# Patient Record
Sex: Female | Born: 1955
Health system: Southern US, Community
[De-identification: ages and names within clinical notes are randomized; demographics above are authoritative.]

## PROBLEM LIST (undated history)

## (undated) DIAGNOSIS — J189 Pneumonia, unspecified organism: Secondary | ICD-10-CM

## (undated) DIAGNOSIS — Z8041 Family history of malignant neoplasm of ovary: Secondary | ICD-10-CM

## (undated) DIAGNOSIS — K219 Gastro-esophageal reflux disease without esophagitis: Secondary | ICD-10-CM

## (undated) DIAGNOSIS — Z923 Personal history of irradiation: Secondary | ICD-10-CM

## (undated) DIAGNOSIS — F419 Anxiety disorder, unspecified: Secondary | ICD-10-CM

## (undated) DIAGNOSIS — C801 Malignant (primary) neoplasm, unspecified: Secondary | ICD-10-CM

## (undated) DIAGNOSIS — C50919 Malignant neoplasm of unspecified site of unspecified female breast: Secondary | ICD-10-CM

## (undated) DIAGNOSIS — E785 Hyperlipidemia, unspecified: Secondary | ICD-10-CM

## (undated) DIAGNOSIS — R011 Cardiac murmur, unspecified: Secondary | ICD-10-CM

## (undated) DIAGNOSIS — E039 Hypothyroidism, unspecified: Secondary | ICD-10-CM

## (undated) DIAGNOSIS — E119 Type 2 diabetes mellitus without complications: Secondary | ICD-10-CM

## (undated) DIAGNOSIS — M8580 Other specified disorders of bone density and structure, unspecified site: Secondary | ICD-10-CM

## (undated) DIAGNOSIS — Z803 Family history of malignant neoplasm of breast: Secondary | ICD-10-CM

## (undated) DIAGNOSIS — I10 Essential (primary) hypertension: Secondary | ICD-10-CM

## (undated) DIAGNOSIS — Z8042 Family history of malignant neoplasm of prostate: Secondary | ICD-10-CM

## (undated) DIAGNOSIS — M1990 Unspecified osteoarthritis, unspecified site: Secondary | ICD-10-CM

## (undated) HISTORY — DX: Anxiety disorder, unspecified: F41.9

## (undated) HISTORY — PX: ABDOMINAL HYSTERECTOMY: SHX81

## (undated) HISTORY — PX: FOOT SURGERY: SHX648

## (undated) HISTORY — DX: Family history of malignant neoplasm of prostate: Z80.42

## (undated) HISTORY — DX: Cardiac murmur, unspecified: R01.1

## (undated) HISTORY — DX: Unspecified osteoarthritis, unspecified site: M19.90

## (undated) HISTORY — DX: Essential (primary) hypertension: I10

## (undated) HISTORY — DX: Hyperlipidemia, unspecified: E78.5

## (undated) HISTORY — PX: TUBAL LIGATION: SHX77

## (undated) HISTORY — DX: Hypothyroidism, unspecified: E03.9

## (undated) HISTORY — DX: Family history of malignant neoplasm of ovary: Z80.41

## (undated) HISTORY — DX: Family history of malignant neoplasm of breast: Z80.3

---

## 1999-01-01 ENCOUNTER — Other Ambulatory Visit: Admission: RE | Admit: 1999-01-01 | Discharge: 1999-01-01 | Payer: Self-pay | Admitting: Obstetrics and Gynecology

## 2000-09-10 ENCOUNTER — Encounter: Payer: Self-pay | Admitting: Obstetrics and Gynecology

## 2000-09-10 ENCOUNTER — Ambulatory Visit (HOSPITAL_COMMUNITY): Admission: RE | Admit: 2000-09-10 | Discharge: 2000-09-10 | Payer: Self-pay | Admitting: Obstetrics and Gynecology

## 2002-09-15 ENCOUNTER — Encounter: Payer: Self-pay | Admitting: Family Medicine

## 2002-09-15 ENCOUNTER — Ambulatory Visit (HOSPITAL_COMMUNITY): Admission: RE | Admit: 2002-09-15 | Discharge: 2002-09-15 | Payer: Self-pay | Admitting: Family Medicine

## 2002-10-03 ENCOUNTER — Encounter (INDEPENDENT_AMBULATORY_CARE_PROVIDER_SITE_OTHER): Payer: Self-pay | Admitting: Cardiology

## 2002-10-03 ENCOUNTER — Ambulatory Visit (HOSPITAL_COMMUNITY): Admission: RE | Admit: 2002-10-03 | Discharge: 2002-10-03 | Payer: Self-pay | Admitting: Family Medicine

## 2003-08-07 ENCOUNTER — Ambulatory Visit (HOSPITAL_COMMUNITY): Admission: RE | Admit: 2003-08-07 | Discharge: 2003-08-07 | Payer: Self-pay | Admitting: Obstetrics and Gynecology

## 2003-08-13 ENCOUNTER — Other Ambulatory Visit: Admission: RE | Admit: 2003-08-13 | Discharge: 2003-08-13 | Payer: Self-pay | Admitting: Obstetrics and Gynecology

## 2005-01-16 ENCOUNTER — Ambulatory Visit (HOSPITAL_COMMUNITY): Admission: RE | Admit: 2005-01-16 | Discharge: 2005-01-16 | Payer: Self-pay | Admitting: Obstetrics and Gynecology

## 2005-03-04 ENCOUNTER — Other Ambulatory Visit: Admission: RE | Admit: 2005-03-04 | Discharge: 2005-03-04 | Payer: Self-pay | Admitting: Obstetrics and Gynecology

## 2005-05-21 ENCOUNTER — Ambulatory Visit (HOSPITAL_COMMUNITY): Admission: RE | Admit: 2005-05-21 | Discharge: 2005-05-21 | Payer: Self-pay | Admitting: Gastroenterology

## 2006-02-26 ENCOUNTER — Ambulatory Visit (HOSPITAL_COMMUNITY): Admission: RE | Admit: 2006-02-26 | Discharge: 2006-02-26 | Payer: Self-pay | Admitting: Obstetrics and Gynecology

## 2006-03-08 ENCOUNTER — Encounter: Admission: RE | Admit: 2006-03-08 | Discharge: 2006-03-08 | Payer: Self-pay | Admitting: Obstetrics and Gynecology

## 2007-11-08 ENCOUNTER — Encounter (HOSPITAL_COMMUNITY): Admission: RE | Admit: 2007-11-08 | Discharge: 2007-12-22 | Payer: Self-pay | Admitting: Endocrinology

## 2009-01-16 ENCOUNTER — Encounter (HOSPITAL_COMMUNITY): Admission: RE | Admit: 2009-01-16 | Discharge: 2009-03-26 | Payer: Self-pay | Admitting: Endocrinology

## 2009-02-15 ENCOUNTER — Ambulatory Visit (HOSPITAL_COMMUNITY): Admission: RE | Admit: 2009-02-15 | Discharge: 2009-02-15 | Payer: Self-pay | Admitting: Endocrinology

## 2009-03-31 ENCOUNTER — Emergency Department (HOSPITAL_COMMUNITY): Admission: EM | Admit: 2009-03-31 | Discharge: 2009-03-31 | Payer: Self-pay | Admitting: Emergency Medicine

## 2009-05-09 ENCOUNTER — Emergency Department (HOSPITAL_COMMUNITY): Admission: EM | Admit: 2009-05-09 | Discharge: 2009-05-10 | Payer: Self-pay | Admitting: Emergency Medicine

## 2010-04-20 ENCOUNTER — Encounter: Payer: Self-pay | Admitting: Obstetrics and Gynecology

## 2011-02-26 ENCOUNTER — Other Ambulatory Visit (HOSPITAL_COMMUNITY): Payer: Self-pay | Admitting: Internal Medicine

## 2011-02-26 DIAGNOSIS — R0602 Shortness of breath: Secondary | ICD-10-CM

## 2011-04-03 ENCOUNTER — Encounter (HOSPITAL_COMMUNITY): Payer: Self-pay

## 2011-04-10 ENCOUNTER — Other Ambulatory Visit (HOSPITAL_COMMUNITY): Payer: Self-pay | Admitting: Radiology

## 2011-04-10 ENCOUNTER — Ambulatory Visit (HOSPITAL_COMMUNITY)
Admission: RE | Admit: 2011-04-10 | Discharge: 2011-04-10 | Disposition: A | Discharge status: Home / Self Care | Payer: BC Managed Care – PPO | Source: Ambulatory Visit | Attending: Internal Medicine | Admitting: Internal Medicine

## 2011-04-10 DIAGNOSIS — R0602 Shortness of breath: Secondary | ICD-10-CM | POA: Insufficient documentation

## 2011-04-10 MED ORDER — ALBUTEROL SULFATE (5 MG/ML) 0.5% IN NEBU
2.5000 mg | INHALATION_SOLUTION | Freq: Once | RESPIRATORY_TRACT | Status: AC
  Administered 2011-04-10: 2.5 mg via RESPIRATORY_TRACT

## 2011-04-10 MED ORDER — ALBUTEROL SULFATE (5 MG/ML) 0.5% IN NEBU: 2.5000 mg | INHALATION_SOLUTION | Freq: Once | RESPIRATORY_TRACT | Status: DC

## 2014-12-05 ENCOUNTER — Other Ambulatory Visit (HOSPITAL_COMMUNITY): Payer: Self-pay | Admitting: Respiratory Therapy

## 2014-12-05 DIAGNOSIS — R0600 Dyspnea, unspecified: Secondary | ICD-10-CM

## 2014-12-06 ENCOUNTER — Other Ambulatory Visit: Payer: Self-pay | Admitting: Internal Medicine

## 2014-12-06 DIAGNOSIS — Z0000 Encounter for general adult medical examination without abnormal findings: Secondary | ICD-10-CM

## 2014-12-06 DIAGNOSIS — F17209 Nicotine dependence, unspecified, with unspecified nicotine-induced disorders: Secondary | ICD-10-CM

## 2014-12-14 ENCOUNTER — Ambulatory Visit (HOSPITAL_COMMUNITY)
Admission: RE | Admit: 2014-12-14 | Discharge: 2014-12-14 | Disposition: A | Discharge status: Home / Self Care | Payer: BC Managed Care – PPO | Source: Ambulatory Visit | Attending: Internal Medicine | Admitting: Internal Medicine

## 2014-12-14 DIAGNOSIS — R0600 Dyspnea, unspecified: Secondary | ICD-10-CM | POA: Diagnosis not present

## 2014-12-14 LAB — PULMONARY FUNCTION TEST
DL/VA % pred: 98 %
DL/VA: 4.62 ml/min/mmHg/L
DLCO UNC % PRED: 78 %
DLCO UNC: 18.01 ml/min/mmHg
FEF 25-75 POST: 2.84 L/s
FEF 25-75 PRE: 3.26 L/s
FEF2575-%CHANGE-POST: -12 %
FEF2575-%PRED-POST: 143 %
FEF2575-%PRED-PRE: 164 %
FEV1-%CHANGE-POST: -4 %
FEV1-%Pred-Post: 111 %
FEV1-%Pred-Pre: 116 %
FEV1-POST: 2.21 L
FEV1-Pre: 2.31 L
FEV1FVC-%Change-Post: 0 %
FEV1FVC-%PRED-PRE: 111 %
FEV6-%CHANGE-POST: -4 %
FEV6-%PRED-POST: 102 %
FEV6-%Pred-Pre: 107 %
FEV6-POST: 2.5 L
FEV6-Pre: 2.62 L
FEV6FVC-%PRED-POST: 104 %
FEV6FVC-%Pred-Pre: 104 %
FVC-%CHANGE-POST: -4 %
FVC-%PRED-POST: 98 %
FVC-%Pred-Pre: 103 %
FVC-Post: 2.5 L
FVC-Pre: 2.62 L
POST FEV1/FVC RATIO: 88 %
PRE FEV1/FVC RATIO: 88 %
Post FEV6/FVC ratio: 100 %
Pre FEV6/FVC Ratio: 100 %
RV % PRED: 89 %
RV: 1.74 L
TLC % pred: 86 %
TLC: 4.24 L

## 2014-12-14 MED ORDER — ALBUTEROL SULFATE (2.5 MG/3ML) 0.083% IN NEBU
2.5000 mg | INHALATION_SOLUTION | Freq: Once | RESPIRATORY_TRACT | Status: AC
  Administered 2014-12-14: 2.5 mg via RESPIRATORY_TRACT

## 2014-12-24 ENCOUNTER — Telehealth (HOSPITAL_COMMUNITY): Payer: Self-pay | Admitting: *Deleted

## 2015-01-02 ENCOUNTER — Other Ambulatory Visit (HOSPITAL_COMMUNITY): Payer: Self-pay | Admitting: Internal Medicine

## 2015-01-02 DIAGNOSIS — R0600 Dyspnea, unspecified: Secondary | ICD-10-CM

## 2015-01-04 ENCOUNTER — Ambulatory Visit (HOSPITAL_COMMUNITY)
Admission: RE | Admit: 2015-01-04 | Discharge: 2015-01-04 | Disposition: A | Discharge status: Home / Self Care | Payer: BC Managed Care – PPO | Source: Ambulatory Visit | Attending: Internal Medicine | Admitting: Internal Medicine

## 2015-01-04 DIAGNOSIS — R918 Other nonspecific abnormal finding of lung field: Secondary | ICD-10-CM | POA: Diagnosis not present

## 2015-01-04 DIAGNOSIS — R0602 Shortness of breath: Secondary | ICD-10-CM | POA: Diagnosis present

## 2015-01-04 DIAGNOSIS — R0600 Dyspnea, unspecified: Secondary | ICD-10-CM

## 2015-01-04 LAB — POCT I-STAT CREATININE: CREATININE: 0.8 mg/dL (ref 0.44–1.00)

## 2015-01-04 MED ORDER — IOHEXOL 350 MG/ML SOLN
100.0000 mL | Freq: Once | INTRAVENOUS | Status: AC | PRN
Start: 2015-01-04 — End: 2015-01-04
  Administered 2015-01-04: 100 mL via INTRAVENOUS

## 2015-01-07 ENCOUNTER — Telehealth: Payer: Self-pay | Admitting: Internal Medicine

## 2015-01-07 ENCOUNTER — Encounter: Payer: Self-pay | Admitting: *Deleted

## 2015-01-07 DIAGNOSIS — R918 Other nonspecific abnormal finding of lung field: Secondary | ICD-10-CM

## 2015-01-07 NOTE — Telephone Encounter (Signed)
New patient appt-s/w patient and gave np appt for 10/17 @ 2 w/Dr. Julien Nordmann.  Referring Dr. Odette Horns Dx-Lung Mass   Referral information scan

## 2015-01-11 ENCOUNTER — Other Ambulatory Visit: Payer: Self-pay | Admitting: Medical Oncology

## 2015-01-11 DIAGNOSIS — R918 Other nonspecific abnormal finding of lung field: Secondary | ICD-10-CM

## 2015-01-14 ENCOUNTER — Other Ambulatory Visit (HOSPITAL_BASED_OUTPATIENT_CLINIC_OR_DEPARTMENT_OTHER): Payer: BC Managed Care – PPO

## 2015-01-14 ENCOUNTER — Other Ambulatory Visit: Payer: BC Managed Care – PPO

## 2015-01-14 ENCOUNTER — Ambulatory Visit (HOSPITAL_BASED_OUTPATIENT_CLINIC_OR_DEPARTMENT_OTHER): Payer: BC Managed Care – PPO | Admitting: Internal Medicine

## 2015-01-14 ENCOUNTER — Encounter: Payer: Self-pay | Admitting: Internal Medicine

## 2015-01-14 VITALS — BP 157/75 | HR 69 | Temp 98.1°F | Resp 20 | Wt 192.0 lb

## 2015-01-14 DIAGNOSIS — R918 Other nonspecific abnormal finding of lung field: Secondary | ICD-10-CM

## 2015-01-14 DIAGNOSIS — Z720 Tobacco use: Secondary | ICD-10-CM | POA: Diagnosis not present

## 2015-01-14 LAB — COMPREHENSIVE METABOLIC PANEL (CC13)
ALT: 27 U/L (ref 0–55)
ANION GAP: 8 meq/L (ref 3–11)
AST: 19 U/L (ref 5–34)
Albumin: 4 g/dL (ref 3.5–5.0)
Alkaline Phosphatase: 74 U/L (ref 40–150)
BILIRUBIN TOTAL: 0.67 mg/dL (ref 0.20–1.20)
BUN: 11 mg/dL (ref 7.0–26.0)
CHLORIDE: 109 meq/L (ref 98–109)
CO2: 26 meq/L (ref 22–29)
CREATININE: 0.9 mg/dL (ref 0.6–1.1)
Calcium: 9.3 mg/dL (ref 8.4–10.4)
EGFR: 86 mL/min/{1.73_m2} — ABNORMAL LOW (ref >90)
GLUCOSE: 162 mg/dL — AB (ref 70–140)
Potassium: 3.7 mEq/L (ref 3.5–5.1)
SODIUM: 143 meq/L (ref 136–145)
TOTAL PROTEIN: 7.3 g/dL (ref 6.4–8.3)

## 2015-01-14 LAB — CBC WITH DIFFERENTIAL/PLATELET
BASO%: 0.7 % (ref 0.0–2.0)
Basophils Absolute: 0 10*3/uL (ref 0.0–0.1)
EOS ABS: 0.1 10*3/uL (ref 0.0–0.5)
EOS%: 1.3 % (ref 0.0–7.0)
HCT: 42.7 % (ref 34.8–46.6)
HGB: 14.1 g/dL (ref 11.6–15.9)
LYMPH%: 37 % (ref 14.0–49.7)
MCH: 28.4 pg (ref 25.1–34.0)
MCHC: 33 g/dL (ref 31.5–36.0)
MCV: 86.2 fL (ref 79.5–101.0)
MONO#: 0.4 10*3/uL (ref 0.1–0.9)
MONO%: 7.5 % (ref 0.0–14.0)
NEUT#: 2.9 10*3/uL (ref 1.5–6.5)
NEUT%: 53.5 % (ref 38.4–76.8)
PLATELETS: 247 10*3/uL (ref 145–400)
RBC: 4.95 10*6/uL (ref 3.70–5.45)
RDW: 14.6 % — ABNORMAL HIGH (ref 11.2–14.5)
WBC: 5.5 10*3/uL (ref 3.9–10.3)
lymph#: 2 10*3/uL (ref 0.9–3.3)

## 2015-01-14 NOTE — Progress Notes (Signed)
[facility] [phone]   [phone]  CONSULT NOTE  REFERRING PHYSICIAN: Dr. Marton Redwood  REASON FOR CONSULTATION:  59 years old African-American female with questionable lung cancer.  HPI Taylor Walsh is a 59 y.o. female with past medical history significant for hypertension, hypercholesterolemia, hypothyroidism, osteoarthritis, borderline diabetes mellitus as well as long history of smoking. The patient mentions that for several weeks she has been complaining of shortness of breath with orthopnea. She was seen by her primary care physician and had bruising test that was unremarkable. She was in the process of being referred to a cardiologist for further evaluation but have shortness breath was getting worse and the patient had CT angiogram of the chest performed on 01/04/2015 to rule out pulmonary embolism. The scan showed no evidence for pulmonary embolus but there was a 1.4 x 1.0 cm multilobulated density noted laterally in the left lung apex concerning for possible neoplasm or malignancy. PET scan was recommended. Dr. Brigitte Pulse kindly referred the patient to me today for further evaluation and recommendation regarding the abnormality seen on the PET scan. When seen today the patient is very anxious and denied having any specific complaints except for the baseline shortness of breath which is worse when she lays down. She also has mild cough but no sputum production. The patient denied having any hemoptysis. She has no significant weight loss but has night sweats secondary to hormonal changes. She has no headache or visual changes. Family history significant for mother with brain tumor, father had stroke, sister had breast cancer, brother had prostate cancer and another brother with colon cancer. The patient is single and has 2 children. She was accompanied today by her sister Cleda Mccreedy. She works as an Optometrist. She has a history of smoking 1 pack per day for  around 40 years and quit 01/04/2015 after her CT scan results. She drinks alcohol occasionally and no history of drug abuse. HPI  Past Medical History  Diagnosis Date  . Anxiety   . Hypertension   . Dyslipidemia   . Hypothyroid   . Osteoporosis     History reviewed. No pertinent past surgical history.  Family History  Problem Relation Age of Onset  . Cancer Mother   . Stroke Father   . Cancer Sister   . Cancer Brother     Social History Social History  Substance Use Topics  . Smoking status: Former Smoker -- 1.00 packs/day    Quit date: 12/31/2014  . Smokeless tobacco: Former Systems developer    Quit date: 01/04/2015  . Alcohol Use: None    Allergies  Allergen Reactions  . Vicodin [Hydrocodone-Acetaminophen] Swelling    Current Outpatient Prescriptions  Medication Sig Dispense Refill  . atorvastatin (LIPITOR) 40 MG tablet Take 40 mg by mouth daily.    Marland Kitchen levothyroxine (SYNTHROID, LEVOTHROID) 88 MCG tablet Take 88 mcg by mouth.    . valsartan (DIOVAN) 160 MG tablet Take 160 mg by mouth daily.     No current facility-administered medications for this visit.    Review of Systems  Constitutional: negative Eyes: negative Ears, nose, mouth, throat, and face: negative Respiratory: positive for cough and dyspnea on exertion Cardiovascular: negative Gastrointestinal: negative Genitourinary:negative Integument/breast: negative Hematologic/lymphatic: negative Musculoskeletal:negative Neurological: negative Behavioral/Psych: positive for anxiety Endocrine: negative Allergic/Immunologic: negative  Physical Exam  AVW:UJWJX, healthy, no distress, well nourished, well developed and anxious SKIN: skin color, texture, turgor are normal, no rashes or significant lesions HEAD: Normocephalic, No masses, lesions, tenderness  or abnormalities EYES: normal, PERRLA, Conjunctiva are pink and non-injected EARS: External ears normal, Canals clear OROPHARYNX:no exudate, no erythema and  lips, buccal mucosa, and tongue normal  NECK: supple, no adenopathy, no JVD LYMPH:  no palpable lymphadenopathy, no hepatosplenomegaly BREAST:not examined LUNGS: clear to auscultation , and palpation HEART: regular rate & rhythm and no murmurs ABDOMEN:abdomen soft, non-tender, normal bowel sounds and no masses or organomegaly BACK: Back symmetric, no curvature., No CVA tenderness EXTREMITIES:no joint deformities, effusion, or inflammation, no edema, no skin discoloration  NEURO: alert & oriented x 3 with fluent speech, no focal motor/sensory deficits  PERFORMANCE STATUS: ECOG 1  LABORATORY DATA: Lab Results  Component Value Date   WBC 5.5 01/14/2015   HGB 14.1 01/14/2015   HCT 42.7 01/14/2015   MCV 86.2 01/14/2015   PLT 247 01/14/2015      Chemistry      Component Value Date/Time   NA 143 01/14/2015 1403   K 3.7 01/14/2015 1403   CO2 26 01/14/2015 1403   BUN 11.0 01/14/2015 1403   CREATININE 0.9 01/14/2015 1403   CREATININE 0.80 01/04/2015 1412      Component Value Date/Time   CALCIUM 9.3 01/14/2015 1403   ALKPHOS 74 01/14/2015 1403   AST 19 01/14/2015 1403   ALT 27 01/14/2015 1403   BILITOT 0.67 01/14/2015 1403       RADIOGRAPHIC STUDIES: Ct Angio Chest Pe W/cm &/or Wo Cm  01/04/2015  CLINICAL DATA:  Shortness of breath for several months. EXAM: CT ANGIOGRAPHY CHEST WITH CONTRAST TECHNIQUE: Multidetector CT imaging of the chest was performed using the standard protocol during bolus administration of intravenous contrast. Multiplanar CT image reconstructions and MIPs were obtained to evaluate the vascular anatomy. CONTRAST:  163mL OMNIPAQUE IOHEXOL 350 MG/ML SOLN COMPARISON:  Chest radiograph of March 31, 2009. FINDINGS: 14 x 10 mm multilobulated density is noted laterally in left lung apex concerning for possible neoplasm. No pneumothorax or pleural effusion is noted. Right lung is clear. There is no evidence of pulmonary embolus. Atherosclerosis of thoracic aorta is  noted without evidence of aneurysm or dissection. No mediastinal mass or adenopathy is noted. Visualized portion of upper abdomen is unremarkable. No significant mediastinal mass or adenopathy is noted. No significant osseous abnormality is noted. Review of the MIP images confirms the above findings. IMPRESSION: No evidence of pulmonary embolus. 14 x 10 mm multilobulated density noted laterally in left lung apex concerning for possible neoplasm or malignancy. PET scan is recommended for further evaluation. These results will be called to the ordering clinician or representative by the Radiologist Assistant, and communication documented in the PACS or zVision Dashboard. Electronically Signed   By: Marijo Conception, M.D.   On: 01/04/2015 14:57    ASSESSMENT: This is a very pleasant 59 years old African-American female with long history of smoking and presenting with left lung apical nodule measuring 1.4 x 1.0 cm concerning for primary lung neoplasm but other etiologies cannot be excluded at this point.   PLAN: I had a lengthy discussion with the patient and her sister today about her current scan results and further investigation to confirm a diagnosis. I showed her the images of the CT scan of the chest. I recommended for the patient to have a PET scan performed for further evaluation of this pulmonary nodule. If the nodule is positive on the PET scan, I will refer the patient to thoracic surgery for consideration of surgical resection after ordering a pulmonary function tests. The patient  would come back for follow-up visit in 2 weeks for reevaluation and more detailed discussion of her treatment options based on the PET scan results. For smoke cessation, I strongly encouraged the patient to continue quitting smoking and offered her help if she has any relapse. She was advised to call immediately if she has any concerning symptoms in the interval.  The patient voices understanding of current disease  status and treatment options and is in agreement with the current care plan.  All questions were answered. The patient knows to call the clinic with any problems, questions or concerns. We can certainly see the patient much sooner if necessary.  Thank you so much for allowing me to participate in the care of Taylor Walsh. I will continue to follow up the patient with you and assist in her care.  I spent 40 minutes counseling the patient face to face. The total time spent in the appointment was 60 minutes.  Disclaimer: This note was dictated with voice recognition software. Similar sounding words can inadvertently be transcribed and may not be corrected upon review.   Shinita Mac K. January 14, 2015, 2:49 PM

## 2015-01-15 ENCOUNTER — Telehealth: Payer: Self-pay | Admitting: Internal Medicine

## 2015-01-15 NOTE — Telephone Encounter (Signed)
vm full....mailed appt sched/avs and letter

## 2015-01-22 ENCOUNTER — Ambulatory Visit (HOSPITAL_COMMUNITY)
Admission: RE | Admit: 2015-01-22 | Discharge: 2015-01-22 | Disposition: A | Discharge status: Home / Self Care | Payer: BC Managed Care – PPO | Source: Ambulatory Visit | Attending: Internal Medicine | Admitting: Internal Medicine

## 2015-01-22 DIAGNOSIS — I2510 Atherosclerotic heart disease of native coronary artery without angina pectoris: Secondary | ICD-10-CM | POA: Insufficient documentation

## 2015-01-22 DIAGNOSIS — L989 Disorder of the skin and subcutaneous tissue, unspecified: Secondary | ICD-10-CM | POA: Insufficient documentation

## 2015-01-22 DIAGNOSIS — Z90710 Acquired absence of both cervix and uterus: Secondary | ICD-10-CM | POA: Diagnosis not present

## 2015-01-22 DIAGNOSIS — I700 Atherosclerosis of aorta: Secondary | ICD-10-CM | POA: Diagnosis not present

## 2015-01-22 DIAGNOSIS — R911 Solitary pulmonary nodule: Secondary | ICD-10-CM | POA: Insufficient documentation

## 2015-01-22 DIAGNOSIS — Z87891 Personal history of nicotine dependence: Secondary | ICD-10-CM | POA: Diagnosis not present

## 2015-01-22 DIAGNOSIS — R918 Other nonspecific abnormal finding of lung field: Secondary | ICD-10-CM

## 2015-01-22 MED ORDER — FLUDEOXYGLUCOSE F - 18 (FDG) INJECTION
9.1000 | Freq: Once | INTRAVENOUS | Status: DC | PRN
  Administered 2015-01-22: 9.1 via INTRAVENOUS
  Filled 2015-01-22: qty 9.1

## 2015-01-25 LAB — GLUCOSE, CAPILLARY: Glucose-Capillary: 99 mg/dL (ref 65–99)

## 2015-01-28 ENCOUNTER — Institutional Professional Consult (permissible substitution) (INDEPENDENT_AMBULATORY_CARE_PROVIDER_SITE_OTHER): Payer: BC Managed Care – PPO | Admitting: Thoracic Surgery (Cardiothoracic Vascular Surgery)

## 2015-01-28 ENCOUNTER — Encounter: Payer: Self-pay | Admitting: Thoracic Surgery (Cardiothoracic Vascular Surgery)

## 2015-01-28 ENCOUNTER — Other Ambulatory Visit: Payer: Self-pay | Admitting: *Deleted

## 2015-01-28 VITALS — BP 136/69 | HR 67 | Resp 20 | Ht 65.0 in | Wt 192.0 lb

## 2015-01-28 DIAGNOSIS — K219 Gastro-esophageal reflux disease without esophagitis: Secondary | ICD-10-CM | POA: Insufficient documentation

## 2015-01-28 DIAGNOSIS — R918 Other nonspecific abnormal finding of lung field: Secondary | ICD-10-CM

## 2015-01-28 DIAGNOSIS — I10 Essential (primary) hypertension: Secondary | ICD-10-CM | POA: Diagnosis not present

## 2015-01-28 DIAGNOSIS — E039 Hypothyroidism, unspecified: Secondary | ICD-10-CM | POA: Diagnosis not present

## 2015-01-28 DIAGNOSIS — R911 Solitary pulmonary nodule: Secondary | ICD-10-CM

## 2015-01-28 NOTE — Progress Notes (Signed)
PCP is Marton Redwood, MD Referring Provider is Curt Bears, MD  Chief Complaint  Patient presents with  . Lung Mass    surgical eval, CTA Chest 01/04/15, PET Scan 01/22/15    HPI: 59 year old woman with a 42-pack-year history of tobacco abuse who is sent for consultation regarding a left upper lobe lung nodule.  Taylor Walsh is a 59 year old woman with a history of tobacco abuse (1 pack per day beginning at age 14, quit 01/04/2015). About a month ago she was having shortness of breath when lying on her back. She noticed this when she was trying to sleep. She described as a sensation that something was trying to close of her airway. She also had similar symptoms when engaging in sexual intercourse. She did not have any tightness in her throat or shortness of breath with exertion when upright.  She saw Dr. Brigitte Pulse. Pulmonary function testing was unrevealing. A CT of the chest was done which showed a nodule in the left apex. A PET CT was done to further evaluate. The nodule is mildly hypermetabolic with an SUV of 1.5. There also was an area of hypermetabolism in the right axilla with no CT correlate.  Since the workup was initiated her symptoms have resolved. She does have occasional wheezing. She has not had any unusual cough. She denies hemoptysis. She has not had any significant weight loss over the past 3 months. She has lost 36 pounds with diet and exercise a year ago but has gained most of that back. She has not had any chest pain, pressure, or tightness with exertion.  Zubrod Score: At the time of surgery this patient's most appropriate activity status/level should be described as: [x]     0    Normal activity, no symptoms []     1    Restricted in physical strenuous activity but ambulatory, able to do out light work []     2    Ambulatory and capable of self care, unable to do work activities, up and about >50 % of waking hours                              []     3    Only limited self care,  in bed greater than 50% of waking hours []     4    Completely disabled, no self care, confined to bed or chair []     5    Moribund    Past Medical History  Diagnosis Date  . Anxiety   . Hypertension   . Dyslipidemia   . Hypothyroid   . Osteoporosis   . Arthritis   . Heart murmur     Past Surgical History  Procedure Laterality Date  . Foot surgery      bi-lat/ repaired hammer toes  . Tubal ligation    . Abdominal hysterectomy      Family History  Problem Relation Age of Onset  . Cancer Mother     brain, ovarian  . Stroke Father   . Cancer Sister     breast  . Cancer Brother   . Hypertension Mother   . Hypertension Father   . Hypertension Sister     Social History Social History  Substance Use Topics  . Smoking status: Current Some Day Smoker -- 1.00 packs/day for 42 years    Last Attempt to Quit: 12/31/2014  . Smokeless tobacco: Former Systems developer    Quit date:  01/04/2015     Comment: Quit 01/04/2015  . Alcohol Use: No    Current Outpatient Prescriptions  Medication Sig Dispense Refill  . atorvastatin (LIPITOR) 40 MG tablet Take 40 mg by mouth daily.    . calcium-vitamin D 250-100 MG-UNIT tablet Take 1 tablet by mouth 2 (two) times daily.    . ergocalciferol (VITAMIN D2) 50000 UNITS capsule Take 50,000 Units by mouth once a week.    . levothyroxine (SYNTHROID, LEVOTHROID) 88 MCG tablet Take 88 mcg by mouth.    . valsartan (DIOVAN) 160 MG tablet Take 160 mg by mouth daily.     No current facility-administered medications for this visit.    Allergies  Allergen Reactions  . Vicodin [Hydrocodone-Acetaminophen] Swelling    Review of Systems  Constitutional: Negative for fever, chills, activity change, appetite change, fatigue and unexpected weight change.  HENT: Negative for dental problem, nosebleeds, trouble swallowing and voice change.   Eyes: Negative for photophobia and visual disturbance.  Respiratory: Positive for shortness of breath (with lying on back,  now resolved) and wheezing. Negative for cough.   Cardiovascular: Negative for chest pain, palpitations and leg swelling.  Gastrointestinal: Positive for abdominal pain. Negative for blood in stool.       Reflux, hiatal hernia  Genitourinary: Negative for dysuria and difficulty urinating.  Musculoskeletal: Positive for joint swelling and arthralgias.  Skin:       Draining knot right axilla  Neurological: Negative for syncope, weakness and headaches.  Hematological: Negative for adenopathy. Does not bruise/bleed easily.  All other systems reviewed and are negative.   BP 136/69 mmHg  Pulse 67  Resp 20  Ht 5\' 5"  (1.651 m)  Wt 192 lb (87.091 kg)  BMI 31.95 kg/m2  SpO2  Physical Exam  Constitutional: She is oriented to person, place, and time. She appears well-developed and well-nourished.  HENT:  Head: Atraumatic.  Mouth/Throat: No oropharyngeal exudate.  Eyes: Conjunctivae and EOM are normal. Pupils are equal, round, and reactive to light. No scleral icterus.  Neck: Neck supple. No tracheal deviation present. No thyromegaly present.  Cardiovascular: Normal rate, regular rhythm and intact distal pulses.  Exam reveals no gallop and no friction rub.   Murmur heard. Pulses:      Radial pulses are 2+ on the right side, and 2+ on the left side.       Dorsalis pedis pulses are 2+ on the right side, and 2+ on the left side.       Posterior tibial pulses are 2+ on the right side, and 2+ on the left side.  Pulmonary/Chest: Effort normal and breath sounds normal. No respiratory distress. She has no wheezes. She has no rales.  Abdominal: Soft. She exhibits no distension. There is no tenderness.  Musculoskeletal: Normal range of motion. She exhibits no edema or tenderness.  Lymphadenopathy:    She has no cervical adenopathy.    She has axillary adenopathy.       Right axillary: Lateral (Hidradenitis) adenopathy present.  Neurological: She is alert and oriented to person, place, and time. No  cranial nerve deficit. She exhibits normal muscle tone.  Motor 5/5  Skin: Skin is warm and dry.  Hidradenitis right axilla  Psychiatric: She has a normal mood and affect.  Vitals reviewed.    Diagnostic Tests: CT ANGIOGRAPHY CHEST WITH CONTRAST  TECHNIQUE: Multidetector CT imaging of the chest was performed using the standard protocol during bolus administration of intravenous contrast. Multiplanar CT image reconstructions and MIPs were obtained to  evaluate the vascular anatomy.  CONTRAST: 142mL OMNIPAQUE IOHEXOL 350 MG/ML SOLN  COMPARISON: Chest radiograph of March 31, 2009.  FINDINGS: 14 x 10 mm multilobulated density is noted laterally in left lung apex concerning for possible neoplasm. No pneumothorax or pleural effusion is noted. Right lung is clear. There is no evidence of pulmonary embolus. Atherosclerosis of thoracic aorta is noted without evidence of aneurysm or dissection. No mediastinal mass or adenopathy is noted. Visualized portion of upper abdomen is unremarkable. No significant mediastinal mass or adenopathy is noted. No significant osseous abnormality is noted.  Review of the MIP images confirms the above findings.  IMPRESSION: No evidence of pulmonary embolus.  14 x 10 mm multilobulated density noted laterally in left lung apex concerning for possible neoplasm or malignancy. PET scan is recommended for further evaluation. These results will be called to the ordering clinician or representative by the Radiologist Assistant, and communication documented in the PACS or zVision Dashboard.   Electronically Signed  By: Marijo Conception, M.D.  On: 01/04/2015 14:57  NUCLEAR MEDICINE PET SKULL BASE TO THIGH  TECHNIQUE: 9.1 mCi F-18 FDG was injected intravenously. Full-ring PET imaging was performed from the skull base to thigh after the radiotracer. CT data was obtained and used for attenuation correction and  anatomic localization.  FASTING BLOOD GLUCOSE: Value: 99 mg/dl  COMPARISON: 01/04/2015 chest CT.  FINDINGS: NECK  No hypermetabolic lymph nodes in the neck.  CHEST  There is a subsolid 1.0 x 1.0 cm peripheral left upper lobe pulmonary nodule with solid 0.8 cm component (series 8/image 14) with minimal metabolism (max SUV 1.5). No acute consolidative airspace disease or additional significant pulmonary nodules.  There is a hypermetabolic 1.8 x 1.2 cm dermal lesion in the lateral upper right chest near the axilla (series 4/image 45) with max SUV 8.8.  No hypermetabolic axillary, mediastinal or hilar nodes. There is atherosclerosis of the thoracic aorta, the great vessels of the mediastinum and the coronary arteries, including calcified atherosclerotic plaque in the left anterior descending coronary artery.  ABDOMEN/PELVIS  No abnormal hypermetabolic activity within the liver, pancreas, adrenal glands, or spleen. No hypermetabolic lymph nodes in the abdomen or pelvis. Status post hysterectomy, with no abnormal findings at the vaginal cuff. No adnexal mass.  SKELETON  No focal hypermetabolic activity to suggest skeletal metastasis. There is nonspecific asymmetric hyper metabolism throughout the right glenohumeral joint, suggesting a nonspecific inflammatory arthropathy.  IMPRESSION: 1. Non-hypermetabolic (max SUV 1.5) subsolid 1.0 cm peripheral left upper lobe pulmonary nodule, suggesting a benign etiology. Given the small nodule size near the PET threshold, subsolid attenuation and lack of prior imaging, a follow-up routine unenhanced chest CT is advised in 6-12 months to document stability of this nodule, as an indolent neoplasm is not entirely excluded. 2. No hypermetabolic thoracic adenopathy or distant metastatic disease. 3. Hypermetabolic 1.8 cm dermal lesion in the lateral upper right chest near the axilla, nonspecific, possibly an inflamed  sebaceous cyst, however recommend correlation with clinical exam to exclude a melanoma.   Electronically Signed  By: Ilona Sorrel M.D.  On: 01/22/2015 15:49  I personally reviewed the CT and PET CT. I concur with findings as noted above. The right axillary activity correlates with hidradenitis in the right axilla on exam.  Pulmonary function testing FVC = 2.62 disease 103%) FEV1 = 2.31 (116%) No change with bronchodilators DLCO = 18.01 (78%)  Impression: 59 year old woman with a history of tobacco abuse who has a 1 cm nodule in the left upper  lobe. This nodule is mildly hypermetabolic by PET CT with an SUV of 1.5. Radiology suggested this suggests benign etiology. I am more concerned given her age, history of tobacco abuse, and the fact that this is solid nodule has any hypermetabolic activity given its size.  I discussed the potential options with Taylor Walsh and her husband. One would be to follow her radiographically. The other would be to proceed with left VATS and wedge resection for definitive diagnosis. We would then proceed with segmentectomy or lobectomy at the same setting if this turned out to be cancer on frozen section. We discussed the relative advantages and disadvantages of each of those approaches.  I described the proposed operation to her in detail. I informed them of the need for general anesthesia, incisions to be used, the intraoperative decision making, chest tube drainage postoperatively, the expected hospital stay, and the overall recovery. I reviewed the indications, risks, benefits, and alternatives. They understand the risks include, but are not limited to death, MI, DVT, PE, bleeding, possible need for transfusion, infection, prolonged air leak, cardiac arrhythmias, as well as the possibility of other unforeseeable complications. She understands and accepts the risks and wishes to proceed.  We also discussed the use of cryo-analgesia of intercostal nerves at  the time of surgery to help with postoperative pain control. She wants to think that over and will let me know on the day of surgery whether she would like to have that done.  Right axillary hidradenitis- follow up with primary MD  Plan:  Left VATS, wedge resection, possible lobectomy or segmentectomy on Monday, 02/25/2015  Melrose Nakayama, MD [facility] [phone]

## 2015-02-05 ENCOUNTER — Encounter: Payer: Self-pay | Admitting: *Deleted

## 2015-02-05 ENCOUNTER — Ambulatory Visit (HOSPITAL_BASED_OUTPATIENT_CLINIC_OR_DEPARTMENT_OTHER): Payer: BC Managed Care – PPO | Admitting: Internal Medicine

## 2015-02-05 ENCOUNTER — Encounter: Payer: Self-pay | Admitting: Internal Medicine

## 2015-02-05 VITALS — BP 141/95 | HR 64 | Temp 98.3°F | Resp 18 | Ht 65.0 in | Wt 197.3 lb

## 2015-02-05 DIAGNOSIS — R918 Other nonspecific abnormal finding of lung field: Secondary | ICD-10-CM | POA: Diagnosis not present

## 2015-02-05 DIAGNOSIS — R911 Solitary pulmonary nodule: Secondary | ICD-10-CM | POA: Diagnosis not present

## 2015-02-05 NOTE — Progress Notes (Signed)
Oncology Nurse Navigator Documentation  Oncology Nurse Navigator Flowsheets 02/05/2015  Navigator Encounter Type Clinic/MDC/spoke with patient and husband today.  Patient is having lung surgery in 2 weeks.  I spoke to them about what is her biggest concern.  She stated she was concerned about how long she would be in the hospital.  I educated them on nursing interventions to help her feel better and hopefully get out of the hospital sooner.    Patient Visit Type Initial  Treatment Phase Abnormal Scans  Barriers/Navigation Needs Education  Education Other  Interventions Education Method  Education Method Verbal  Time Spent with Patient 15

## 2015-02-05 NOTE — Progress Notes (Signed)
[place] [phone]   [phone]  OFFICE PROGRESS NOTE  Marton Redwood, MD [place] [address]  DIAGNOSIS: Suspicious left upper lobe lung nodule.  PRIOR THERAPY: None  CURRENT THERAPY: The patient is scheduled for surgical resection on 02/25/2015 by Dr. Roxan Hockey  INTERVAL HISTORY: Taylor Walsh 59 y.o. female returns to the clinic today for follow-up visit. The patient is feeling fine today was no specific complaints. She denied having any significant weight loss or night sweats. She has no chest pain, shortness of breath, cough or hemoptysis. She denied having any significant fever or chills. She recently had a PET scan performed that showed a sub-solid 1.0 x 1.0 cm peripheral left upper lobe pulmonary nodule with a solid 0.8 cm component but with minimal SUV of 1.5 questionable to be benign. The patient was also seen by Dr. Roxan Hockey and she was given several options including observation versus biopsy versus surgical resection and she decided to proceed with surgical resection which is is scheduled on 02/25/2015.  MEDICAL HISTORY: Past Medical History  Diagnosis Date  . Anxiety   . Hypertension   . Dyslipidemia   . Hypothyroid   . Osteoporosis   . Arthritis   . Heart murmur     ALLERGIES:  is allergic to vicodin.  MEDICATIONS:  Current Outpatient Prescriptions  Medication Sig Dispense Refill  . atorvastatin (LIPITOR) 40 MG tablet Take 40 mg by mouth daily.    . calcium-vitamin D 250-100 MG-UNIT tablet Take 1 tablet by mouth 2 (two) times daily.    . ergocalciferol (VITAMIN D2) 50000 UNITS capsule Take 50,000 Units by mouth once a week.    . levothyroxine (SYNTHROID, LEVOTHROID) 88 MCG tablet Take 88 mcg by mouth.    . valsartan (DIOVAN) 160 MG tablet Take 160 mg by mouth daily.     No current facility-administered medications for this visit.    SURGICAL HISTORY:  Past Surgical History  Procedure  Laterality Date  . Foot surgery      bi-lat/ repaired hammer toes  . Tubal ligation    . Abdominal hysterectomy      REVIEW OF SYSTEMS:  A comprehensive review of systems was negative.   PHYSICAL EXAMINATION: General appearance: alert, cooperative and no distress Head: Normocephalic, without obvious abnormality, atraumatic Neck: no adenopathy, no JVD, supple, symmetrical, trachea midline and thyroid not enlarged, symmetric, no tenderness/mass/nodules Lymph nodes: Cervical, supraclavicular, and axillary nodes normal. Resp: clear to auscultation bilaterally Back: symmetric, no curvature. ROM normal. No CVA tenderness. Cardio: regular rate and rhythm, S1, S2 normal, no murmur, click, rub or gallop GI: soft, non-tender; bowel sounds normal; no masses,  no organomegaly Extremities: extremities normal, atraumatic, no cyanosis or edema  ECOG PERFORMANCE STATUS: 1 - Symptomatic but completely ambulatory  Blood pressure 141/95, pulse 64, temperature 98.3 F (36.8 C), temperature source Oral, resp. rate 18, height 5\' 5"  (1.651 m), weight 197 lb 4.8 oz (89.495 kg), SpO2 100 %.  LABORATORY DATA: Lab Results  Component Value Date   WBC 5.5 01/14/2015   HGB 14.1 01/14/2015   HCT 42.7 01/14/2015   MCV 86.2 01/14/2015   PLT 247 01/14/2015      Chemistry      Component Value Date/Time   NA 143 01/14/2015 1403   K 3.7 01/14/2015 1403   CO2 26 01/14/2015 1403   BUN 11.0 01/14/2015 1403   CREATININE 0.9 01/14/2015 1403   CREATININE 0.80 01/04/2015 1412  Component Value Date/Time   CALCIUM 9.3 01/14/2015 1403   ALKPHOS 74 01/14/2015 1403   AST 19 01/14/2015 1403   ALT 27 01/14/2015 1403   BILITOT 0.67 01/14/2015 1403       RADIOGRAPHIC STUDIES: Nm Pet Image Initial (pi) Skull Base To Thigh  01/22/2015  CLINICAL DATA:  Initial treatment strategy for left upper lobe lung nodule. Former smoker, recently quit. EXAM: NUCLEAR MEDICINE PET SKULL BASE TO THIGH TECHNIQUE: 9.1 mCi F-18  FDG was injected intravenously. Full-ring PET imaging was performed from the skull base to thigh after the radiotracer. CT data was obtained and used for attenuation correction and anatomic localization. FASTING BLOOD GLUCOSE:  Value: 99 mg/dl COMPARISON:  01/04/2015 chest CT. FINDINGS: NECK No hypermetabolic lymph nodes in the neck. CHEST There is a subsolid 1.0 x 1.0 cm peripheral left upper lobe pulmonary nodule with solid 0.8 cm component (series 8/image 14) with minimal metabolism (max SUV 1.5). No acute consolidative airspace disease or additional significant pulmonary nodules. There is a hypermetabolic 1.8 x 1.2 cm dermal lesion in the lateral upper right chest near the axilla (series 4/image 45) with max SUV 8.8. No hypermetabolic axillary, mediastinal or hilar nodes. There is atherosclerosis of the thoracic aorta, the great vessels of the mediastinum and the coronary arteries, including calcified atherosclerotic plaque in the left anterior descending coronary artery. ABDOMEN/PELVIS No abnormal hypermetabolic activity within the liver, pancreas, adrenal glands, or spleen. No hypermetabolic lymph nodes in the abdomen or pelvis. Status post hysterectomy, with no abnormal findings at the vaginal cuff. No adnexal mass. SKELETON No focal hypermetabolic activity to suggest skeletal metastasis. There is nonspecific asymmetric hyper metabolism throughout the right glenohumeral joint, suggesting a nonspecific inflammatory arthropathy. IMPRESSION: 1. Non-hypermetabolic (max SUV 1.5) subsolid 1.0 cm peripheral left upper lobe pulmonary nodule, suggesting a benign etiology. Given the small nodule size near the PET threshold, subsolid attenuation and lack of prior imaging, a follow-up routine unenhanced chest CT is advised in 6-12 months to document stability of this nodule, as an indolent neoplasm is not entirely excluded. 2. No hypermetabolic thoracic adenopathy or distant metastatic disease. 3. Hypermetabolic 1.8 cm  dermal lesion in the lateral upper right chest near the axilla, nonspecific, possibly an inflamed sebaceous cyst, however recommend correlation with clinical exam to exclude a melanoma. Electronically Signed   By: Ilona Sorrel M.D.   On: 01/22/2015 15:49    ASSESSMENT AND PLAN: This is a very pleasant 60 years old African-American female with suspicious left upper lobe pulmonary nodule with low-grade SUV on the recent PET scan questionable to be benign origin but low-grade adenocarcinoma, be completely excluded at this point. The patient was given several options for management of her condition by Dr. Roxan Hockey including observation versus biopsy followed by stereotactic radiotherapy versus surgical resection and the patient would like to proceed with the surgical resection which is already scheduled on 02/25/2015. If the final pathology is consistent with lung cancer, I would see the patient for follow-up visit after her surgical resection for discussion of adjuvant treatment and monitoring if needed. The patient was advised to call immediately if she has any concerning symptoms in the interval. The patient voices understanding of current disease status and treatment options and is in agreement with the current care plan.  All questions were answered. The patient knows to call the clinic with any problems, questions or concerns. We can certainly see the patient much sooner if necessary.  Disclaimer: This note was dictated with voice recognition software.  Similar sounding words can inadvertently be transcribed and may not be corrected upon review.

## 2015-02-19 ENCOUNTER — Encounter (HOSPITAL_COMMUNITY): Payer: Self-pay

## 2015-02-19 ENCOUNTER — Encounter (HOSPITAL_COMMUNITY)
Admission: RE | Admit: 2015-02-19 | Discharge: 2015-02-19 | Disposition: A | Discharge status: Home / Self Care | Payer: BC Managed Care – PPO | Source: Ambulatory Visit | Attending: Thoracic Surgery (Cardiothoracic Vascular Surgery) | Admitting: Thoracic Surgery (Cardiothoracic Vascular Surgery)

## 2015-02-19 VITALS — BP 133/78 | HR 77 | Temp 97.4°F | Resp 18 | Ht 65.0 in | Wt 195.2 lb

## 2015-02-19 DIAGNOSIS — Z01812 Encounter for preprocedural laboratory examination: Secondary | ICD-10-CM | POA: Insufficient documentation

## 2015-02-19 DIAGNOSIS — Z01810 Encounter for preprocedural cardiovascular examination: Secondary | ICD-10-CM | POA: Insufficient documentation

## 2015-02-19 DIAGNOSIS — R911 Solitary pulmonary nodule: Secondary | ICD-10-CM

## 2015-02-19 HISTORY — DX: Other specified disorders of bone density and structure, unspecified site: M85.80

## 2015-02-19 HISTORY — DX: Gastro-esophageal reflux disease without esophagitis: K21.9

## 2015-02-19 LAB — URINALYSIS, ROUTINE W REFLEX MICROSCOPIC
BILIRUBIN URINE: NEGATIVE
GLUCOSE, UA: NEGATIVE mg/dL
Hgb urine dipstick: NEGATIVE
KETONES UR: NEGATIVE mg/dL
Leukocytes, UA: NEGATIVE
NITRITE: NEGATIVE
PH: 7 (ref 5.0–8.0)
Protein, ur: NEGATIVE mg/dL
Specific Gravity, Urine: 1.01 (ref 1.005–1.030)

## 2015-02-19 LAB — TYPE AND SCREEN
ABO/RH(D): O POS
ANTIBODY SCREEN: NEGATIVE

## 2015-02-19 LAB — BLOOD GAS, ARTERIAL
Acid-base deficit: 0.5 mmol/L (ref 0.0–2.0)
BICARBONATE: 23.5 meq/L (ref 20.0–24.0)
DRAWN BY: 42180
FIO2: 0.21
O2 Saturation: 97.2 %
PH ART: 7.413 (ref 7.350–7.450)
Patient temperature: 98.6
TCO2: 24.6 mmol/L (ref 0–100)
pCO2 arterial: 37.5 mmHg (ref 35.0–45.0)
pO2, Arterial: 93.9 mmHg (ref 80.0–100.0)

## 2015-02-19 LAB — COMPREHENSIVE METABOLIC PANEL
ALBUMIN: 3.9 g/dL (ref 3.5–5.0)
ALK PHOS: 61 U/L (ref 38–126)
ALT: 35 U/L (ref 14–54)
ANION GAP: 8 (ref 5–15)
AST: 31 U/L (ref 15–41)
BILIRUBIN TOTAL: 0.9 mg/dL (ref 0.3–1.2)
BUN: 10 mg/dL (ref 6–20)
CALCIUM: 9.2 mg/dL (ref 8.9–10.3)
CO2: 24 mmol/L (ref 22–32)
CREATININE: 0.75 mg/dL (ref 0.44–1.00)
Chloride: 107 mmol/L (ref 101–111)
GFR calc Af Amer: >60 mL/min (ref >60)
GFR calc non Af Amer: >60 mL/min (ref >60)
GLUCOSE: 99 mg/dL (ref 65–99)
Potassium: 4.3 mmol/L (ref 3.5–5.1)
SODIUM: 139 mmol/L (ref 135–145)
Total Protein: 7.1 g/dL (ref 6.5–8.1)

## 2015-02-19 LAB — SURGICAL PCR SCREEN
MRSA, PCR: NEGATIVE
Staphylococcus aureus: NEGATIVE

## 2015-02-19 LAB — CBC
HEMATOCRIT: 42.4 % (ref 36.0–46.0)
Hemoglobin: 14.1 g/dL (ref 12.0–15.0)
MCH: 28.7 pg (ref 26.0–34.0)
MCHC: 33.3 g/dL (ref 30.0–36.0)
MCV: 86.2 fL (ref 78.0–100.0)
Platelets: 224 10*3/uL (ref 150–400)
RBC: 4.92 MIL/uL (ref 3.87–5.11)
RDW: 14.6 % (ref 11.5–15.5)
WBC: 5 10*3/uL (ref 4.0–10.5)

## 2015-02-19 LAB — ABO/RH: ABO/RH(D): O POS

## 2015-02-19 LAB — GLUCOSE, CAPILLARY: GLUCOSE-CAPILLARY: 97 mg/dL (ref 65–99)

## 2015-02-19 LAB — PROTIME-INR
INR: 1.01 (ref 0.00–1.49)
PROTHROMBIN TIME: 13.5 s (ref 11.6–15.2)

## 2015-02-19 LAB — APTT: APTT: 26 s (ref 24–37)

## 2015-02-19 NOTE — Pre-Procedure Instructions (Signed)
    Taylor Walsh  02/19/2015      [facility] [place] [place] [id number] [place] ([place]) [place] [address] Phone: [phone] Fax: [phone]    Your procedure is scheduled on 02-25-2015   Monday  .  Report to [facility] Admitting at 5:30A.M.   Call this number if you have problems the morning of surgery:  [phone]   Remember:  Do not eat food or drink liquids after midnight.   Take these medicines the morning of surgery with A SIP OF WATER levothyroxine(Synthroid)   Do not wear jewelry, make-up or nail polish.  Do not wear lotions, powders, or perfumes.    Do not shave 48 hours prior to surgery.     Do not bring valuables to the hospital.  [facility] is not responsible for any belongings or valuables.  Contacts, dentures or bridgework may not be worn into surgery.  Leave your suitcase in the car.  After surgery it may be brought to your room.  For patients admitted to the hospital, discharge time will be determined by your treatment team.     Special instructions:  See attached Sheet for instructions on CHG shower  Please read over the following fact sheets that you were given. Pain Booklet, Coughing and Deep Breathing, Blood Transfusion Information and Surgical Site Infection Prevention

## 2015-02-24 ENCOUNTER — Encounter (HOSPITAL_COMMUNITY): Payer: Self-pay | Admitting: Certified Registered Nurse Anesthetist

## 2015-02-24 MED ORDER — DEXTROSE 5 % IV SOLN
1.5000 g | INTRAVENOUS | Status: AC
  Administered 2015-02-25: 1.5 g via INTRAVENOUS
  Filled 2015-02-24: qty 1.5

## 2015-02-25 ENCOUNTER — Inpatient Hospital Stay (HOSPITAL_COMMUNITY): Payer: BC Managed Care – PPO

## 2015-02-25 ENCOUNTER — Encounter (HOSPITAL_COMMUNITY)
Admission: RE | Disposition: A | Discharge status: Home / Self Care | Payer: Self-pay | Source: Ambulatory Visit | Attending: Thoracic Surgery (Cardiothoracic Vascular Surgery)

## 2015-02-25 ENCOUNTER — Inpatient Hospital Stay (HOSPITAL_COMMUNITY): Payer: BC Managed Care – PPO | Admitting: Certified Registered Nurse Anesthetist

## 2015-02-25 ENCOUNTER — Encounter (HOSPITAL_COMMUNITY): Payer: Self-pay | Admitting: *Deleted

## 2015-02-25 ENCOUNTER — Inpatient Hospital Stay (HOSPITAL_COMMUNITY)
Admission: RE | Admit: 2015-02-25 | Discharge: 2015-03-01 | DRG: 168 | Disposition: A | Discharge status: Home / Self Care | Payer: BC Managed Care – PPO | Source: Ambulatory Visit | Attending: Thoracic Surgery (Cardiothoracic Vascular Surgery) | Admitting: Thoracic Surgery (Cardiothoracic Vascular Surgery)

## 2015-02-25 DIAGNOSIS — Z808 Family history of malignant neoplasm of other organs or systems: Secondary | ICD-10-CM

## 2015-02-25 DIAGNOSIS — E039 Hypothyroidism, unspecified: Secondary | ICD-10-CM | POA: Diagnosis present

## 2015-02-25 DIAGNOSIS — Z8041 Family history of malignant neoplasm of ovary: Secondary | ICD-10-CM | POA: Diagnosis not present

## 2015-02-25 DIAGNOSIS — K219 Gastro-esophageal reflux disease without esophagitis: Secondary | ICD-10-CM | POA: Diagnosis present

## 2015-02-25 DIAGNOSIS — F419 Anxiety disorder, unspecified: Secondary | ICD-10-CM | POA: Diagnosis present

## 2015-02-25 DIAGNOSIS — R55 Syncope and collapse: Secondary | ICD-10-CM | POA: Diagnosis not present

## 2015-02-25 DIAGNOSIS — Z8249 Family history of ischemic heart disease and other diseases of the circulatory system: Secondary | ICD-10-CM

## 2015-02-25 DIAGNOSIS — Z6831 Body mass index (BMI) 31.0-31.9, adult: Secondary | ICD-10-CM

## 2015-02-25 DIAGNOSIS — M810 Age-related osteoporosis without current pathological fracture: Secondary | ICD-10-CM | POA: Diagnosis present

## 2015-02-25 DIAGNOSIS — Z823 Family history of stroke: Secondary | ICD-10-CM | POA: Diagnosis not present

## 2015-02-25 DIAGNOSIS — J939 Pneumothorax, unspecified: Secondary | ICD-10-CM

## 2015-02-25 DIAGNOSIS — E785 Hyperlipidemia, unspecified: Secondary | ICD-10-CM | POA: Diagnosis present

## 2015-02-25 DIAGNOSIS — L732 Hidradenitis suppurativa: Secondary | ICD-10-CM | POA: Diagnosis present

## 2015-02-25 DIAGNOSIS — K449 Diaphragmatic hernia without obstruction or gangrene: Secondary | ICD-10-CM | POA: Diagnosis present

## 2015-02-25 DIAGNOSIS — Z886 Allergy status to analgesic agent status: Secondary | ICD-10-CM | POA: Diagnosis not present

## 2015-02-25 DIAGNOSIS — D1432 Benign neoplasm of left bronchus and lung: Secondary | ICD-10-CM | POA: Diagnosis present

## 2015-02-25 DIAGNOSIS — Z803 Family history of malignant neoplasm of breast: Secondary | ICD-10-CM | POA: Diagnosis not present

## 2015-02-25 DIAGNOSIS — M1990 Unspecified osteoarthritis, unspecified site: Secondary | ICD-10-CM | POA: Diagnosis present

## 2015-02-25 DIAGNOSIS — R911 Solitary pulmonary nodule: Secondary | ICD-10-CM | POA: Diagnosis present

## 2015-02-25 DIAGNOSIS — Z90710 Acquired absence of both cervix and uterus: Secondary | ICD-10-CM

## 2015-02-25 DIAGNOSIS — F17200 Nicotine dependence, unspecified, uncomplicated: Secondary | ICD-10-CM | POA: Diagnosis present

## 2015-02-25 DIAGNOSIS — Z885 Allergy status to narcotic agent status: Secondary | ICD-10-CM | POA: Diagnosis not present

## 2015-02-25 DIAGNOSIS — I10 Essential (primary) hypertension: Secondary | ICD-10-CM | POA: Diagnosis present

## 2015-02-25 DIAGNOSIS — R011 Cardiac murmur, unspecified: Secondary | ICD-10-CM | POA: Diagnosis present

## 2015-02-25 DIAGNOSIS — Z902 Acquired absence of lung [part of]: Secondary | ICD-10-CM

## 2015-02-25 HISTORY — PX: VIDEO ASSISTED THORACOSCOPY (VATS)/WEDGE RESECTION: SHX6174

## 2015-02-25 SURGERY — VIDEO ASSISTED THORACOSCOPY (VATS)/WEDGE RESECTION
Anesthesia: General | Site: Chest | Laterality: Left

## 2015-02-25 MED ORDER — ATORVASTATIN CALCIUM 40 MG PO TABS
40.0000 mg | ORAL_TABLET | Freq: Every day | ORAL | Status: DC
  Administered 2015-02-25 – 2015-02-28 (×4): 40 mg via ORAL
  Filled 2015-02-25 (×4): qty 1

## 2015-02-25 MED ORDER — OXYCODONE HCL 5 MG PO TABS: 5.0000 mg | ORAL_TABLET | ORAL | Status: DC | PRN

## 2015-02-25 MED ORDER — ONDANSETRON HCL 4 MG/2ML IJ SOLN: 4.0000 mg | Freq: Four times a day (QID) | INTRAMUSCULAR | Status: DC | PRN

## 2015-02-25 MED ORDER — LUNG SURGERY BOOK
Freq: Once | Status: AC
  Administered 2015-02-25: 18:00:00
  Filled 2015-02-25: qty 1

## 2015-02-25 MED ORDER — ASPIRIN EC 81 MG PO TBEC
81.0000 mg | DELAYED_RELEASE_TABLET | Freq: Every day | ORAL | Status: DC
  Administered 2015-02-25 – 2015-03-01 (×5): 81 mg via ORAL
  Filled 2015-02-25 (×5): qty 1

## 2015-02-25 MED ORDER — MUPIROCIN 2 % EX OINT: 1.0000 "application " | TOPICAL_OINTMENT | Freq: Once | CUTANEOUS | Status: DC

## 2015-02-25 MED ORDER — ONDANSETRON HCL 4 MG/2ML IJ SOLN
INTRAMUSCULAR | Status: AC
  Filled 2015-02-25: qty 2

## 2015-02-25 MED ORDER — BUPIVACAINE 0.5 % ON-Q PUMP SINGLE CATH 400 ML
400.0000 mL | INJECTION | Status: DC
  Administered 2015-02-25: 400 mL
  Filled 2015-02-25: qty 400

## 2015-02-25 MED ORDER — MIDAZOLAM HCL 2 MG/2ML IJ SOLN
INTRAMUSCULAR | Status: AC
  Filled 2015-02-25: qty 2

## 2015-02-25 MED ORDER — ACETAMINOPHEN 160 MG/5ML PO SOLN: 1000.0000 mg | Freq: Four times a day (QID) | ORAL | Status: DC

## 2015-02-25 MED ORDER — 0.9 % SODIUM CHLORIDE (POUR BTL) OPTIME
TOPICAL | Status: DC | PRN
  Administered 2015-02-25: 1000 mL

## 2015-02-25 MED ORDER — PROPOFOL 10 MG/ML IV BOLUS
INTRAVENOUS | Status: DC | PRN
  Administered 2015-02-25: 100 mg via INTRAVENOUS

## 2015-02-25 MED ORDER — LIDOCAINE HCL (CARDIAC) 20 MG/ML IV SOLN
INTRAVENOUS | Status: AC
  Filled 2015-02-25: qty 5

## 2015-02-25 MED ORDER — MIDAZOLAM HCL 2 MG/2ML IJ SOLN: 0.5000 mg | Freq: Once | INTRAMUSCULAR | Status: DC | PRN

## 2015-02-25 MED ORDER — KCL IN DEXTROSE-NACL 20-5-0.45 MEQ/L-%-% IV SOLN
INTRAVENOUS | Status: DC
  Administered 2015-02-25 (×2): via INTRAVENOUS
  Administered 2015-02-25: 100 mL/h via INTRAVENOUS
  Filled 2015-02-25 (×5): qty 1000

## 2015-02-25 MED ORDER — GLYCOPYRROLATE 0.2 MG/ML IJ SOLN
INTRAMUSCULAR | Status: DC | PRN
  Administered 2015-02-25: .8 mg via INTRAVENOUS

## 2015-02-25 MED ORDER — ROCURONIUM BROMIDE 100 MG/10ML IV SOLN
INTRAVENOUS | Status: DC | PRN
  Administered 2015-02-25: 50 mg via INTRAVENOUS

## 2015-02-25 MED ORDER — ROCURONIUM BROMIDE 50 MG/5ML IV SOLN
INTRAVENOUS | Status: AC
  Filled 2015-02-25: qty 1

## 2015-02-25 MED ORDER — KCL IN DEXTROSE-NACL 20-5-0.45 MEQ/L-%-% IV SOLN
INTRAVENOUS | Status: AC
  Filled 2015-02-25: qty 1000

## 2015-02-25 MED ORDER — BISACODYL 5 MG PO TBEC
10.0000 mg | DELAYED_RELEASE_TABLET | Freq: Every day | ORAL | Status: DC
  Administered 2015-02-25 – 2015-02-27 (×3): 10 mg via ORAL
  Filled 2015-02-25 (×5): qty 2

## 2015-02-25 MED ORDER — LACTATED RINGERS IV SOLN
INTRAVENOUS | Status: DC | PRN
  Administered 2015-02-25: 07:00:00 via INTRAVENOUS

## 2015-02-25 MED ORDER — NEOSTIGMINE METHYLSULFATE 10 MG/10ML IV SOLN
INTRAVENOUS | Status: DC | PRN
  Administered 2015-02-25: 4.5 mg via INTRAVENOUS

## 2015-02-25 MED ORDER — BUPIVACAINE ON-Q PAIN PUMP (FOR ORDER SET NO CHG): INJECTION | Status: AC

## 2015-02-25 MED ORDER — FENTANYL CITRATE (PF) 100 MCG/2ML IJ SOLN
INTRAMUSCULAR | Status: DC | PRN
  Administered 2015-02-25 (×2): 50 ug via INTRAVENOUS
  Administered 2015-02-25: 200 ug via INTRAVENOUS
  Administered 2015-02-25 (×3): 50 ug via INTRAVENOUS

## 2015-02-25 MED ORDER — NALOXONE HCL 0.4 MG/ML IJ SOLN: 0.4000 mg | INTRAMUSCULAR | Status: DC | PRN

## 2015-02-25 MED ORDER — ADULT MULTIVITAMIN W/MINERALS CH
1.0000 | ORAL_TABLET | Freq: Every day | ORAL | Status: DC
  Administered 2015-02-25 – 2015-03-01 (×5): 1 via ORAL
  Filled 2015-02-25 (×5): qty 1

## 2015-02-25 MED ORDER — SODIUM CHLORIDE 0.9 % IJ SOLN: 9.0000 mL | INTRAMUSCULAR | Status: DC | PRN

## 2015-02-25 MED ORDER — PROPOFOL 10 MG/ML IV BOLUS
INTRAVENOUS | Status: AC
  Filled 2015-02-25: qty 20

## 2015-02-25 MED ORDER — FENTANYL 40 MCG/ML IV SOLN
INTRAVENOUS | Status: AC
  Administered 2015-02-25: 45 ug via INTRAVENOUS
  Administered 2015-02-25: 15 ug via INTRAVENOUS
  Administered 2015-02-25: 40 ug via INTRAVENOUS
  Administered 2015-02-25 – 2015-02-26 (×5): 0 ug via INTRAVENOUS
  Filled 2015-02-25 (×2): qty 25

## 2015-02-25 MED ORDER — PHENYLEPHRINE HCL 10 MG/ML IJ SOLN
10.0000 mg | INTRAVENOUS | Status: DC | PRN
  Administered 2015-02-25: 10 ug/min via INTRAVENOUS

## 2015-02-25 MED ORDER — TRAMADOL HCL 50 MG PO TABS
50.0000 mg | ORAL_TABLET | Freq: Four times a day (QID) | ORAL | Status: DC | PRN
  Administered 2015-02-26 – 2015-03-01 (×3): 100 mg via ORAL
  Filled 2015-02-25 (×3): qty 2

## 2015-02-25 MED ORDER — HYDROMORPHONE HCL 1 MG/ML IJ SOLN: 0.2500 mg | INTRAMUSCULAR | Status: DC | PRN

## 2015-02-25 MED ORDER — MEPERIDINE HCL 25 MG/ML IJ SOLN: 6.2500 mg | INTRAMUSCULAR | Status: DC | PRN

## 2015-02-25 MED ORDER — PROMETHAZINE HCL 25 MG/ML IJ SOLN
6.2500 mg | INTRAMUSCULAR | Status: DC | PRN
Start: 2015-02-25 — End: 2015-02-25

## 2015-02-25 MED ORDER — BUPIVACAINE HCL (PF) 0.5 % IJ SOLN
INTRAMUSCULAR | Status: AC
  Filled 2015-02-25: qty 10

## 2015-02-25 MED ORDER — DIPHENHYDRAMINE HCL 12.5 MG/5ML PO ELIX: 12.5000 mg | ORAL_SOLUTION | Freq: Four times a day (QID) | ORAL | Status: DC | PRN

## 2015-02-25 MED ORDER — LEVOTHYROXINE SODIUM 88 MCG PO TABS
88.0000 ug | ORAL_TABLET | Freq: Every day | ORAL | Status: DC
  Administered 2015-02-26 – 2015-03-01 (×4): 88 ug via ORAL
  Filled 2015-02-25 (×5): qty 1

## 2015-02-25 MED ORDER — DEXTROSE 5 % IV SOLN
1.5000 g | Freq: Two times a day (BID) | INTRAVENOUS | Status: AC
  Administered 2015-02-25 – 2015-02-26 (×2): 1.5 g via INTRAVENOUS
  Filled 2015-02-25 (×2): qty 1.5

## 2015-02-25 MED ORDER — FENTANYL CITRATE (PF) 250 MCG/5ML IJ SOLN
INTRAMUSCULAR | Status: AC
  Filled 2015-02-25: qty 5

## 2015-02-25 MED ORDER — ACETAMINOPHEN 500 MG PO TABS
1000.0000 mg | ORAL_TABLET | Freq: Four times a day (QID) | ORAL | Status: DC
  Administered 2015-02-25 – 2015-03-01 (×15): 1000 mg via ORAL
  Filled 2015-02-25 (×14): qty 2

## 2015-02-25 MED ORDER — POTASSIUM CHLORIDE 10 MEQ/50ML IV SOLN: 10.0000 meq | Freq: Every day | INTRAVENOUS | Status: DC | PRN

## 2015-02-25 MED ORDER — DIPHENHYDRAMINE HCL 50 MG/ML IJ SOLN: 12.5000 mg | Freq: Four times a day (QID) | INTRAMUSCULAR | Status: DC | PRN

## 2015-02-25 MED ORDER — SENNOSIDES-DOCUSATE SODIUM 8.6-50 MG PO TABS
1.0000 | ORAL_TABLET | Freq: Every day | ORAL | Status: DC
  Administered 2015-02-25 – 2015-02-28 (×3): 1 via ORAL
  Filled 2015-02-25 (×5): qty 1

## 2015-02-25 MED ORDER — BUPIVACAINE HCL (PF) 0.5 % IJ SOLN
INTRAMUSCULAR | Status: DC | PRN
  Administered 2015-02-25: 10 mL

## 2015-02-25 MED ORDER — ONDANSETRON HCL 4 MG/2ML IJ SOLN
INTRAMUSCULAR | Status: DC | PRN
  Administered 2015-02-25: 4 mg via INTRAVENOUS

## 2015-02-25 MED ORDER — MIDAZOLAM HCL 5 MG/5ML IJ SOLN
INTRAMUSCULAR | Status: DC | PRN
  Administered 2015-02-25 (×2): 1 mg via INTRAVENOUS

## 2015-02-25 MED ORDER — LIDOCAINE HCL (CARDIAC) 20 MG/ML IV SOLN
INTRAVENOUS | Status: DC | PRN
  Administered 2015-02-25: 20 mg via INTRAVENOUS

## 2015-02-25 SURGICAL SUPPLY — 98 items
ADH SKN CLS APL DERMABOND .7 (GAUZE/BANDAGES/DRESSINGS)
ADH SKN CLS LQ APL DERMABOND (GAUZE/BANDAGES/DRESSINGS) ×3
APL SKNCLS STERI-STRIP NONHPOA (GAUZE/BANDAGES/DRESSINGS) ×3
BAG SPEC RTRVL LRG 6X4 10 (ENDOMECHANICALS) ×3
BENZOIN TINCTURE PRP APPL 2/3 (GAUZE/BANDAGES/DRESSINGS) ×5 IMPLANT
CANISTER SUCTION 2500CC (MISCELLANEOUS) ×10 IMPLANT
CATH KIT ON Q 5IN SLV (PAIN MANAGEMENT) IMPLANT
CATH KIT ON-Q SILVERSOAK 5 (CATHETERS) IMPLANT
CATH KIT ON-Q SILVERSOAK 5IN (CATHETERS) ×5 IMPLANT
CATH THORACIC 28FR (CATHETERS) IMPLANT
CATH THORACIC 36FR (CATHETERS) IMPLANT
CATH THORACIC 36FR RT ANG (CATHETERS) IMPLANT
CLIP TI MEDIUM 6 (CLIP) ×5 IMPLANT
CONN ST 1/4X3/8  BEN (MISCELLANEOUS)
CONN ST 1/4X3/8 BEN (MISCELLANEOUS) IMPLANT
CONN Y 3/8X3/8X3/8  BEN (MISCELLANEOUS)
CONN Y 3/8X3/8X3/8 BEN (MISCELLANEOUS) IMPLANT
CONT SPEC 4OZ CLIKSEAL STRL BL (MISCELLANEOUS) ×25 IMPLANT
COVER SURGICAL LIGHT HANDLE (MISCELLANEOUS) ×5 IMPLANT
CUTTER ECHEON FLEX ENDO 45 340 (ENDOMECHANICALS) ×4 IMPLANT
DERMABOND ADHESIVE PROPEN (GAUZE/BANDAGES/DRESSINGS) ×2
DERMABOND ADVANCED (GAUZE/BANDAGES/DRESSINGS)
DERMABOND ADVANCED .7 DNX12 (GAUZE/BANDAGES/DRESSINGS) IMPLANT
DERMABOND ADVANCED .7 DNX6 (GAUZE/BANDAGES/DRESSINGS) ×2 IMPLANT
DRAIN CHANNEL 28F RND 3/8 FF (WOUND CARE) IMPLANT
DRAIN CHANNEL 32F RND 10.7 FF (WOUND CARE) IMPLANT
DRAPE LAPAROSCOPIC ABDOMINAL (DRAPES) ×5 IMPLANT
DRAPE WARM FLUID 44X44 (DRAPE) ×5 IMPLANT
ELECT BLADE 6.5 EXT (BLADE) ×5 IMPLANT
ELECT REM PT RETURN 9FT ADLT (ELECTROSURGICAL) ×5
ELECTRODE REM PT RTRN 9FT ADLT (ELECTROSURGICAL) ×3 IMPLANT
GAUZE SPONGE 4X4 12PLY STRL (GAUZE/BANDAGES/DRESSINGS) ×5 IMPLANT
GLOVE BIO SURGEON STRL SZ 6 (GLOVE) ×4 IMPLANT
GLOVE BIO SURGEON STRL SZ 6.5 (GLOVE) ×4 IMPLANT
GLOVE BIO SURGEONS STRL SZ 6.5 (GLOVE) ×1
GLOVE BIOGEL PI IND STRL 6.5 (GLOVE) ×6 IMPLANT
GLOVE BIOGEL PI INDICATOR 6.5 (GLOVE) ×6
GLOVE SURG SIGNA 7.5 PF LTX (GLOVE) ×10 IMPLANT
GOWN STRL REUS W/ TWL LRG LVL3 (GOWN DISPOSABLE) ×6 IMPLANT
GOWN STRL REUS W/ TWL XL LVL3 (GOWN DISPOSABLE) ×6 IMPLANT
GOWN STRL REUS W/TWL LRG LVL3 (GOWN DISPOSABLE) ×10
GOWN STRL REUS W/TWL XL LVL3 (GOWN DISPOSABLE) ×10
HANDLE STAPLE ENDO GIA SHORT (STAPLE)
HEMOSTAT SURGICEL 2X14 (HEMOSTASIS) IMPLANT
KIT BASIN OR (CUSTOM PROCEDURE TRAY) ×5 IMPLANT
KIT ROOM TURNOVER OR (KITS) ×5 IMPLANT
KIT SUCTION CATH 14FR (SUCTIONS) ×5 IMPLANT
NS IRRIG 1000ML POUR BTL (IV SOLUTION) ×15 IMPLANT
PACK CHEST (CUSTOM PROCEDURE TRAY) ×5 IMPLANT
PAD ARMBOARD 7.5X6 YLW CONV (MISCELLANEOUS) ×10 IMPLANT
POUCH ENDO CATCH II 15MM (MISCELLANEOUS) IMPLANT
POUCH SPECIMEN RETRIEVAL 10MM (ENDOMECHANICALS) ×5 IMPLANT
RELOAD GOLD ECHELON 45 (STAPLE) ×16 IMPLANT
SCISSORS ENDO CVD 5DCS (MISCELLANEOUS) IMPLANT
SEALANT PROGEL (MISCELLANEOUS) IMPLANT
SEALANT SURG COSEAL 4ML (VASCULAR PRODUCTS) IMPLANT
SEALANT SURG COSEAL 8ML (VASCULAR PRODUCTS) IMPLANT
SOLUTION ANTI FOG 6CC (MISCELLANEOUS) ×5 IMPLANT
SPECIMEN JAR MEDIUM (MISCELLANEOUS) ×5 IMPLANT
SPONGE GAUZE 4X4 12PLY STER LF (GAUZE/BANDAGES/DRESSINGS) ×5 IMPLANT
SPONGE TONSIL 1 RF SGL (DISPOSABLE) ×5 IMPLANT
STAPLER ENDO GIA 12 SHRT THIN (STAPLE) IMPLANT
STAPLER ENDO GIA 12MM SHORT (STAPLE) IMPLANT
SUT PROLENE 4 0 RB 1 (SUTURE)
SUT PROLENE 4-0 RB1 .5 CRCL 36 (SUTURE) IMPLANT
SUT SILK  1 MH (SUTURE) ×4
SUT SILK 1 MH (SUTURE) ×5 IMPLANT
SUT SILK 1 TIES 10X30 (SUTURE) ×5 IMPLANT
SUT SILK 2 0 SH (SUTURE) IMPLANT
SUT SILK 2 0SH CR/8 30 (SUTURE) IMPLANT
SUT SILK 3 0 SH 30 (SUTURE) ×4 IMPLANT
SUT SILK 3 0SH CR/8 30 (SUTURE) IMPLANT
SUT VIC AB 0 CTX 27 (SUTURE) IMPLANT
SUT VIC AB 1 CTX 27 (SUTURE) ×5 IMPLANT
SUT VIC AB 2-0 CT1 27 (SUTURE)
SUT VIC AB 2-0 CT1 TAPERPNT 27 (SUTURE) IMPLANT
SUT VIC AB 2-0 CTX 36 (SUTURE) ×5 IMPLANT
SUT VIC AB 3-0 MH 27 (SUTURE) IMPLANT
SUT VIC AB 3-0 SH 27 (SUTURE)
SUT VIC AB 3-0 SH 27X BRD (SUTURE) IMPLANT
SUT VIC AB 3-0 X1 27 (SUTURE) ×5 IMPLANT
SUT VICRYL 0 UR6 27IN ABS (SUTURE) ×10 IMPLANT
SUT VICRYL 2 TP 1 (SUTURE) IMPLANT
SWAB COLLECTION DEVICE MRSA (MISCELLANEOUS) IMPLANT
SYR CONTROL 10ML LL (SYRINGE) ×5 IMPLANT
SYSTEM SAHARA CHEST DRAIN ATS (WOUND CARE) ×5 IMPLANT
TAPE CLOTH SURG 4X10 WHT LF (GAUZE/BANDAGES/DRESSINGS) ×4 IMPLANT
TIP APPLICATOR SPRAY EXTEND 16 (VASCULAR PRODUCTS) IMPLANT
TOWEL OR 17X24 6PK STRL BLUE (TOWEL DISPOSABLE) ×5 IMPLANT
TOWEL OR 17X26 10 PK STRL BLUE (TOWEL DISPOSABLE) ×10 IMPLANT
TRAP SPECIMEN MUCOUS 40CC (MISCELLANEOUS) IMPLANT
TRAY FOLEY CATH 16FRSI W/METER (SET/KITS/TRAYS/PACK) ×5 IMPLANT
TROCAR BLADELESS 5MM (ENDOMECHANICALS) ×4 IMPLANT
TROCAR XCEL BLADELESS 5X75MML (TROCAR) ×5 IMPLANT
TROCAR XCEL NON-BLD 5MMX100MML (ENDOMECHANICALS) IMPLANT
TUBE ANAEROBIC SPECIMEN COL (MISCELLANEOUS) IMPLANT
TUNNELER SHEATH ON-Q 11GX8 DSP (PAIN MANAGEMENT) ×5 IMPLANT
WATER STERILE IRR 1000ML POUR (IV SOLUTION) ×5 IMPLANT

## 2015-02-25 NOTE — Brief Op Note (Addendum)
02/25/2015  9:17 AM  PATIENT:  Taylor Walsh  60 y.o. female  PRE-OPERATIVE DIAGNOSIS:  Left upper lobe lung nodule  POST-OPERATIVE DIAGNOSIS:  Benign nodule, left upper lobe  PROCEDURE:   LEFT VIDEO ASSISTED THORACOSCOPY  LEFT UPPER LOBE WEDGE RESECTION   On-Q local anesthetic catheter placement   SURGEON:  Melrose Nakayama, MD  ASSISTANT: Suzzanne Cloud, PA-C  ANESTHESIA:   general  SPECIMEN:  Source of Specimen:  Left upper lobe wedge resection  DISPOSITION OF SPECIMEN:  Pathology  DRAINS: 28 Fr CT  PATIENT CONDITION:  PACU - hemodynamically stable.  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 No malignancy seen

## 2015-02-25 NOTE — Transfer of Care (Signed)
Immediate Anesthesia Transfer of Care Note  Patient: Taylor Walsh  Procedure(s) Performed: Procedure(s): VIDEO ASSISTED THORACOSCOPY (VATS)/ LUL WEDGE RESECTION with ON Q placement. (Left)  Patient Location: PACU  Anesthesia Type:General  Level of Consciousness: awake, alert  and oriented  Airway & Oxygen Therapy: Patient Spontanous Breathing and Patient connected to face mask oxygen  Post-op Assessment: Report given to RN and Post -op Vital signs reviewed and stable  Post vital signs: Reviewed and stable  Last Vitals:  Filed Vitals:   02/25/15 0618  BP: 112/52  Pulse: 68  Temp: 36.4 C  Resp: 18    Complications: No apparent anesthesia complications

## 2015-02-25 NOTE — Interval H&P Note (Signed)
History and Physical Interval Note:  02/25/2015 7:11 AM  Taylor Walsh  has presented today for surgery, with the diagnosis of LUL NODULE  The various methods of treatment have been discussed with the patient and family. After consideration of risks, benefits and other options for treatment, the patient has consented to  Procedure(s): VIDEO ASSISTED THORACOSCOPY (VATS)/ LUL WEDGE RESECTION (Left) POSSIBLE LOBECTOMY OR SEGMENTECTOMY (Left) POSSIBLE CRYO INTERCOSTAL NERVE BLOCK (N/A) as a surgical intervention .  The patient's history has been reviewed, patient examined, no change in status, stable for surgery.  I have reviewed the patient's chart and labs.  Questions were answered to the patient's satisfaction.     Taylor Walsh

## 2015-02-25 NOTE — Anesthesia Procedure Notes (Addendum)
Procedure Name: Intubation Date/Time: 02/25/2015 7:41 AM Performed by: Clearnce Sorrel Pre-anesthesia Checklist: Patient identified, Timeout performed, Emergency Drugs available, Suction available and Patient being monitored Patient Re-evaluated:Patient Re-evaluated prior to inductionOxygen Delivery Method: Circle system utilized Preoxygenation: Pre-oxygenation with 100% oxygen Intubation Type: IV induction Ventilation: Mask ventilation without difficulty Laryngoscope Size: Mac and 3 Grade View: Grade II Tube type: Oral Endobronchial tube: Double lumen EBT and 37 Fr Number of attempts: 1 Airway Equipment and Method: Stylet Placement Confirmation: ETT inserted through vocal cords under direct vision,  breath sounds checked- equal and bilateral and positive ETCO2 Tube secured with: Tape Dental Injury: Teeth and Oropharynx as per pre-operative assessment     RIJ CVP Dual Lumen: [id number]: The patient was identified and consent obtained.  TO was performed, and full barrier precautions were used.  The skin was anesthetized with lidocaine.  Once the vein was located with the 22 ga. needle using ultrasound guidance , the wire was inserted into the vein.  The wire location was confirmed with ultrasound.  The tissue was dilated and the catheter was carefully inserted, then sutured in place. A dressing was applied. Left IJ evaluated and attempt unsuccessful and no problems. The patient tolerated the procedure well.   CE

## 2015-02-25 NOTE — Progress Notes (Signed)
[facility] BRIEF SICU PROGRESS NOTE  Day of Surgery  S/P Procedure(s) (LRB): VIDEO ASSISTED THORACOSCOPY (VATS)/ LUL WEDGE RESECTION with ON Q placement. (Left)   Stable postop Excellent analgesia NSR w/ stable BP O2 sats 99% breathing comfortably Chest tube output low, no air leak  Plan: Continue routine early postop  Rexene Alberts, MD 02/25/2015 7:24 PM

## 2015-02-25 NOTE — H&P (View-Only) (Signed)
PCP is Shaw, William, MD Referring Provider is Mohamed, Mohamed, MD  Chief Complaint  Patient presents with  . Lung Mass    surgical eval, CTA Chest 01/04/15, PET Scan 01/22/15    HPI: 59-year-old woman with a 42-pack-year history of tobacco abuse who is sent for consultation regarding a left upper lobe lung nodule.  Mrs. Newcomer is a 59-year-old woman with a history of tobacco abuse (1 pack per day beginning at age 17, quit 01/04/2015). About a month ago she was having shortness of breath when lying on her back. She noticed this when she was trying to sleep. She described as a sensation that something was trying to close of her airway. She also had similar symptoms when engaging in sexual intercourse. She did not have any tightness in her throat or shortness of breath with exertion when upright.  She saw Dr. Shaw. Pulmonary function testing was unrevealing. A CT of the chest was done which showed a nodule in the left apex. A PET CT was done to further evaluate. The nodule is mildly hypermetabolic with an SUV of 1.5. There also was an area of hypermetabolism in the right axilla with no CT correlate.  Since the workup was initiated her symptoms have resolved. She does have occasional wheezing. She has not had any unusual cough. She denies hemoptysis. She has not had any significant weight loss over the past 3 months. She has lost 36 pounds with diet and exercise a year ago but has gained most of that back. She has not had any chest pain, pressure, or tightness with exertion.  Zubrod Score: At the time of surgery this patient's most appropriate activity status/level should be described as: [x]    0    Normal activity, no symptoms []    1    Restricted in physical strenuous activity but ambulatory, able to do out light work []    2    Ambulatory and capable of self care, unable to do work activities, up and about >50 % of waking hours                              []    3    Only limited self care,  in bed greater than 50% of waking hours []    4    Completely disabled, no self care, confined to bed or chair []    5    Moribund    Past Medical History  Diagnosis Date  . Anxiety   . Hypertension   . Dyslipidemia   . Hypothyroid   . Osteoporosis   . Arthritis   . Heart murmur     Past Surgical History  Procedure Laterality Date  . Foot surgery      bi-lat/ repaired hammer toes  . Tubal ligation    . Abdominal hysterectomy      Family History  Problem Relation Age of Onset  . Cancer Mother     brain, ovarian  . Stroke Father   . Cancer Sister     breast  . Cancer Brother   . Hypertension Mother   . Hypertension Father   . Hypertension Sister     Social History Social History  Substance Use Topics  . Smoking status: Current Some Day Smoker -- 1.00 packs/day for 42 years    Last Attempt to Quit: 12/31/2014  . Smokeless tobacco: Former User    Quit date:   01/04/2015     Comment: Quit 01/04/2015  . Alcohol Use: No    Current Outpatient Prescriptions  Medication Sig Dispense Refill  . atorvastatin (LIPITOR) 40 MG tablet Take 40 mg by mouth daily.    . calcium-vitamin D 250-100 MG-UNIT tablet Take 1 tablet by mouth 2 (two) times daily.    . ergocalciferol (VITAMIN D2) 50000 UNITS capsule Take 50,000 Units by mouth once a week.    . levothyroxine (SYNTHROID, LEVOTHROID) 88 MCG tablet Take 88 mcg by mouth.    . valsartan (DIOVAN) 160 MG tablet Take 160 mg by mouth daily.     No current facility-administered medications for this visit.    Allergies  Allergen Reactions  . Vicodin [Hydrocodone-Acetaminophen] Swelling    Review of Systems  Constitutional: Negative for fever, chills, activity change, appetite change, fatigue and unexpected weight change.  HENT: Negative for dental problem, nosebleeds, trouble swallowing and voice change.   Eyes: Negative for photophobia and visual disturbance.  Respiratory: Positive for shortness of breath (with lying on back,  now resolved) and wheezing. Negative for cough.   Cardiovascular: Negative for chest pain, palpitations and leg swelling.  Gastrointestinal: Positive for abdominal pain. Negative for blood in stool.       Reflux, hiatal hernia  Genitourinary: Negative for dysuria and difficulty urinating.  Musculoskeletal: Positive for joint swelling and arthralgias.  Skin:       Draining knot right axilla  Neurological: Negative for syncope, weakness and headaches.  Hematological: Negative for adenopathy. Does not bruise/bleed easily.  All other systems reviewed and are negative.   BP 136/69 mmHg  Pulse 67  Resp 20  Ht 5' 5" (1.651 m)  Wt 192 lb (87.091 kg)  BMI 31.95 kg/m2  SpO2  Physical Exam  Constitutional: She is oriented to person, place, and time. She appears well-developed and well-nourished.  HENT:  Head: Atraumatic.  Mouth/Throat: No oropharyngeal exudate.  Eyes: Conjunctivae and EOM are normal. Pupils are equal, round, and reactive to light. No scleral icterus.  Neck: Neck supple. No tracheal deviation present. No thyromegaly present.  Cardiovascular: Normal rate, regular rhythm and intact distal pulses.  Exam reveals no gallop and no friction rub.   Murmur heard. Pulses:      Radial pulses are 2+ on the right side, and 2+ on the left side.       Dorsalis pedis pulses are 2+ on the right side, and 2+ on the left side.       Posterior tibial pulses are 2+ on the right side, and 2+ on the left side.  Pulmonary/Chest: Effort normal and breath sounds normal. No respiratory distress. She has no wheezes. She has no rales.  Abdominal: Soft. She exhibits no distension. There is no tenderness.  Musculoskeletal: Normal range of motion. She exhibits no edema or tenderness.  Lymphadenopathy:    She has no cervical adenopathy.    She has axillary adenopathy.       Right axillary: Lateral (Hidradenitis) adenopathy present.  Neurological: She is alert and oriented to person, place, and time. No  cranial nerve deficit. She exhibits normal muscle tone.  Motor 5/5  Skin: Skin is warm and dry.  Hidradenitis right axilla  Psychiatric: She has a normal mood and affect.  Vitals reviewed.    Diagnostic Tests: CT ANGIOGRAPHY CHEST WITH CONTRAST  TECHNIQUE: Multidetector CT imaging of the chest was performed using the standard protocol during bolus administration of intravenous contrast. Multiplanar CT image reconstructions and MIPs were obtained to   evaluate the vascular anatomy.  CONTRAST: 100mL OMNIPAQUE IOHEXOL 350 MG/ML SOLN  COMPARISON: Chest radiograph of March 31, 2009.  FINDINGS: 14 x 10 mm multilobulated density is noted laterally in left lung apex concerning for possible neoplasm. No pneumothorax or pleural effusion is noted. Right lung is clear. There is no evidence of pulmonary embolus. Atherosclerosis of thoracic aorta is noted without evidence of aneurysm or dissection. No mediastinal mass or adenopathy is noted. Visualized portion of upper abdomen is unremarkable. No significant mediastinal mass or adenopathy is noted. No significant osseous abnormality is noted.  Review of the MIP images confirms the above findings.  IMPRESSION: No evidence of pulmonary embolus.  14 x 10 mm multilobulated density noted laterally in left lung apex concerning for possible neoplasm or malignancy. PET scan is recommended for further evaluation. These results will be called to the ordering clinician or representative by the Radiologist Assistant, and communication documented in the PACS or zVision Dashboard.   Electronically Signed  By: James Green Jr, M.D.  On: 01/04/2015 14:57  NUCLEAR MEDICINE PET SKULL BASE TO THIGH  TECHNIQUE: 9.1 mCi F-18 FDG was injected intravenously. Full-ring PET imaging was performed from the skull base to thigh after the radiotracer. CT data was obtained and used for attenuation correction and  anatomic localization.  FASTING BLOOD GLUCOSE: Value: 99 mg/dl  COMPARISON: 01/04/2015 chest CT.  FINDINGS: NECK  No hypermetabolic lymph nodes in the neck.  CHEST  There is a subsolid 1.0 x 1.0 cm peripheral left upper lobe pulmonary nodule with solid 0.8 cm component (series 8/image 14) with minimal metabolism (max SUV 1.5). No acute consolidative airspace disease or additional significant pulmonary nodules.  There is a hypermetabolic 1.8 x 1.2 cm dermal lesion in the lateral upper right chest near the axilla (series 4/image 45) with max SUV 8.8.  No hypermetabolic axillary, mediastinal or hilar nodes. There is atherosclerosis of the thoracic aorta, the great vessels of the mediastinum and the coronary arteries, including calcified atherosclerotic plaque in the left anterior descending coronary artery.  ABDOMEN/PELVIS  No abnormal hypermetabolic activity within the liver, pancreas, adrenal glands, or spleen. No hypermetabolic lymph nodes in the abdomen or pelvis. Status post hysterectomy, with no abnormal findings at the vaginal cuff. No adnexal mass.  SKELETON  No focal hypermetabolic activity to suggest skeletal metastasis. There is nonspecific asymmetric hyper metabolism throughout the right glenohumeral joint, suggesting a nonspecific inflammatory arthropathy.  IMPRESSION: 1. Non-hypermetabolic (max SUV 1.5) subsolid 1.0 cm peripheral left upper lobe pulmonary nodule, suggesting a benign etiology. Given the small nodule size near the PET threshold, subsolid attenuation and lack of prior imaging, a follow-up routine unenhanced chest CT is advised in 6-12 months to document stability of this nodule, as an indolent neoplasm is not entirely excluded. 2. No hypermetabolic thoracic adenopathy or distant metastatic disease. 3. Hypermetabolic 1.8 cm dermal lesion in the lateral upper right chest near the axilla, nonspecific, possibly an inflamed  sebaceous cyst, however recommend correlation with clinical exam to exclude a melanoma.   Electronically Signed  By: Jason A Poff M.D.  On: 01/22/2015 15:49  I personally reviewed the CT and PET CT. I concur with findings as noted above. The right axillary activity correlates with hidradenitis in the right axilla on exam.  Pulmonary function testing FVC = 2.62 disease 103%) FEV1 = 2.31 (116%) No change with bronchodilators DLCO = 18.01 (78%)  Impression: 59-year-old woman with a history of tobacco abuse who has a 1 cm nodule in the left upper   lobe. This nodule is mildly hypermetabolic by PET CT with an SUV of 1.5. Radiology suggested this suggests benign etiology. I am more concerned given her age, history of tobacco abuse, and the fact that this is solid nodule has any hypermetabolic activity given its size.  I discussed the potential options with Mrs. Titsworth and her husband. One would be to follow her radiographically. The other would be to proceed with left VATS and wedge resection for definitive diagnosis. We would then proceed with segmentectomy or lobectomy at the same setting if this turned out to be cancer on frozen section. We discussed the relative advantages and disadvantages of each of those approaches.  I described the proposed operation to her in detail. I informed them of the need for general anesthesia, incisions to be used, the intraoperative decision making, chest tube drainage postoperatively, the expected hospital stay, and the overall recovery. I reviewed the indications, risks, benefits, and alternatives. They understand the risks include, but are not limited to death, MI, DVT, PE, bleeding, possible need for transfusion, infection, prolonged air leak, cardiac arrhythmias, as well as the possibility of other unforeseeable complications. She understands and accepts the risks and wishes to proceed.  We also discussed the use of cryo-analgesia of intercostal nerves at  the time of surgery to help with postoperative pain control. She wants to think that over and will let me know on the day of surgery whether she would like to have that done.  Right axillary hidradenitis- follow up with primary MD  Plan:  Left VATS, wedge resection, possible lobectomy or segmentectomy on Monday, 02/25/2015  Sammie Denner C Justo Hengel, MD [facility] [phone]  

## 2015-02-25 NOTE — Progress Notes (Signed)
Utilization Review Completed.Taylor Walsh T11/28/2016  

## 2015-02-25 NOTE — Anesthesia Postprocedure Evaluation (Signed)
Anesthesia Post Note  Patient: Taylor Walsh  Procedure(s) Performed: Procedure(s) (LRB): VIDEO ASSISTED THORACOSCOPY (VATS)/ LUL WEDGE RESECTION with ON Q placement. (Left)  Patient location during evaluation: PACU Anesthesia Type: General Level of consciousness: awake and alert and patient cooperative Pain management: pain level controlled Vital Signs Assessment: post-procedure vital signs reviewed and stable Respiratory status: spontaneous breathing, nonlabored ventilation, respiratory function stable and patient connected to nasal cannula oxygen Cardiovascular status: blood pressure returned to baseline and stable Postop Assessment: No signs of nausea or vomiting Anesthetic complications: no    Last Vitals:  Filed Vitals:   02/25/15 1200 02/25/15 1300  BP: 109/89 131/80  Pulse: 78 67  Temp:    Resp: 14 23    Last Pain:  Filed Vitals:   02/25/15 1307  PainSc: 2                  Katty Fretwell,E. Adlynn Lowenstein

## 2015-02-25 NOTE — Anesthesia Preprocedure Evaluation (Addendum)
Anesthesia Evaluation  Patient identified by MRN, date of birth, ID band Patient awake    Reviewed: Allergy & Precautions, NPO status , Patient's Chart, lab work & pertinent test results  History of Anesthesia Complications Negative for: history of anesthetic complications  Airway Mallampati: III  TM Distance: >3 FB Neck ROM: Full    Dental  (+) Dental Advisory Given, Poor Dentition, Missing, Chipped   Pulmonary COPD, Current Smoker,    breath sounds clear to auscultation       Cardiovascular hypertension, Pt. on medications (-) angina Rhythm:Regular Rate:Normal  '04 ECHO: normal LVF and valves   Neuro/Psych Anxiety negative neurological ROS     GI/Hepatic Neg liver ROS, GERD  Controlled,  Endo/Other  Hypothyroidism Morbid obesity  Renal/GU negative Renal ROS     Musculoskeletal  (+) Arthritis , Osteoarthritis,    Abdominal (+) + obese,   Peds  Hematology negative hematology ROS (+)   Anesthesia Other Findings   Reproductive/Obstetrics                          Anesthesia Physical Anesthesia Plan  ASA: III  Anesthesia Plan: General   Post-op Pain Management:    Induction: Intravenous  Airway Management Planned: Double Lumen EBT  Additional Equipment: Arterial line, CVP and Ultrasound Guidance Line Placement  Intra-op Plan:   Post-operative Plan: Extubation in OR  Informed Consent: I have reviewed the patients History and Physical, chart, labs and discussed the procedure including the risks, benefits and alternatives for the proposed anesthesia with the patient or authorized representative who has indicated his/her understanding and acceptance.   Dental advisory given  Plan Discussed with: Anesthesiologist, Surgeon and CRNA  Anesthesia Plan Comments: (Plan routine monitors, A-line, CVP, GETA with DLT)       Anesthesia Quick Evaluation

## 2015-02-26 ENCOUNTER — Inpatient Hospital Stay (HOSPITAL_COMMUNITY): Payer: BC Managed Care – PPO

## 2015-02-26 ENCOUNTER — Encounter (HOSPITAL_COMMUNITY): Payer: Self-pay | Admitting: Thoracic Surgery (Cardiothoracic Vascular Surgery)

## 2015-02-26 LAB — BLOOD GAS, ARTERIAL
ACID-BASE EXCESS: 2.8 mmol/L — AB (ref 0.0–2.0)
Bicarbonate: 27.3 mEq/L — ABNORMAL HIGH (ref 20.0–24.0)
DRAWN BY: 441661
FIO2: 0.21
O2 Saturation: 95.3 %
PCO2 ART: 45.3 mmHg — AB (ref 35.0–45.0)
PO2 ART: 76.8 mmHg — AB (ref 80.0–100.0)
Patient temperature: 98.6
TCO2: 28.7 mmol/L (ref 0–100)
pH, Arterial: 7.397 (ref 7.350–7.450)

## 2015-02-26 LAB — BASIC METABOLIC PANEL
Anion gap: 7 (ref 5–15)
CHLORIDE: 109 mmol/L (ref 101–111)
CO2: 26 mmol/L (ref 22–32)
CREATININE: 0.74 mg/dL (ref 0.44–1.00)
Calcium: 8.2 mg/dL — ABNORMAL LOW (ref 8.9–10.3)
GFR calc Af Amer: >60 mL/min (ref >60)
GFR calc non Af Amer: >60 mL/min (ref >60)
Glucose, Bld: 122 mg/dL — ABNORMAL HIGH (ref 65–99)
Potassium: 4 mmol/L (ref 3.5–5.1)
Sodium: 142 mmol/L (ref 135–145)

## 2015-02-26 LAB — CBC
HCT: 38.3 % (ref 36.0–46.0)
Hemoglobin: 12.2 g/dL (ref 12.0–15.0)
MCH: 27.7 pg (ref 26.0–34.0)
MCHC: 31.9 g/dL (ref 30.0–36.0)
MCV: 87 fL (ref 78.0–100.0)
PLATELETS: 224 10*3/uL (ref 150–400)
RBC: 4.4 MIL/uL (ref 3.87–5.11)
RDW: 15.2 % (ref 11.5–15.5)
WBC: 8 10*3/uL (ref 4.0–10.5)

## 2015-02-26 MED ORDER — ATROPINE SULFATE 0.1 MG/ML IJ SOLN
INTRAMUSCULAR | Status: AC
  Administered 2015-02-26: 0.5 mg
  Filled 2015-02-26: qty 10

## 2015-02-26 MED ORDER — ENOXAPARIN SODIUM 40 MG/0.4ML ~~LOC~~ SOLN
40.0000 mg | Freq: Every day | SUBCUTANEOUS | Status: DC
  Administered 2015-02-26 – 2015-03-01 (×4): 40 mg via SUBCUTANEOUS
  Filled 2015-02-26 (×4): qty 0.4

## 2015-02-26 NOTE — Progress Notes (Signed)
Pt hr 46 with dangle and stand at bedside. B/P 60's/30 pt complaining of being dizzy and about to pass out. Pt placed in chair. HR and B/P not recovering while in chair. Pt lethargic with complaints of stomach cramping. Atropine administered per symptomatic bradycardia protocol. HR and B/P recovered to 70-80 SR with PAC's. B/P improved to 90/60. Will notify MD

## 2015-02-26 NOTE — Progress Notes (Signed)
Patient ambulated in hallway 600 ft, min assist with wheelchair, tolerated walk well, patient up to chair, call bell within reach, will continue to monitor.  Rowe Pavy, RN

## 2015-02-26 NOTE — Progress Notes (Signed)
CT surgery p.m. Rounds  Patient comfortable sitting up in chair status post left VATS Not having significant thoracotomy pain Maintaining sinus rhythm Breath sounds clear Minimal chest tube drainage Walk multiple times in the hallway Stable day

## 2015-02-26 NOTE — Progress Notes (Signed)
Patient ambulated in hallway 300 ft, no symptoms of dizziness or lightheadedness, tolerated walk well, up in chair, call bell within reach, will continue to monitor.  Rowe Pavy, RN

## 2015-02-26 NOTE — Progress Notes (Signed)
1 Day Post-Op Procedure(s) (LRB): VIDEO ASSISTED THORACOSCOPY (VATS)/ LUL WEDGE RESECTION with ON Q placement. (Left) Subjective: Vagal episode this AM She feels better now. Denies nausea, light headedness. Mild incisional pain  Objective: Vital signs in last 24 hours: Temp:  [97.5 F (36.4 C)-98.2 F (36.8 C)] 98.2 F (36.8 C) (11/29 0709) Pulse Rate:  [42-100] 51 (11/29 0700) Cardiac Rhythm:  [-] Normal sinus rhythm;Sinus bradycardia (11/29 0751) Resp:  [14-29] 25 (11/29 0742) BP: (65-131)/(43-89) 65/43 mmHg (11/29 0700) SpO2:  [95 %-100 %] 99 % (11/29 0742) Arterial Line BP: (63-168)/(55-89) 63/57 mmHg (11/29 0700) Weight:  [195 lb (88.45 kg)-199 lb 1.2 oz (90.3 kg)] 199 lb 1.2 oz (90.3 kg) (11/29 0700)  Hemodynamic parameters for last 24 hours:    Intake/Output from previous day: 11/28 0701 - 11/29 0700 In: 2828.3 [I.V.:2728.3; IV Piggyback:100] Out: [id number] [Urine:3900; Blood:50; Chest Tube:190] Intake/Output this shift:    General appearance: alert, cooperative and no distress Neurologic: intact Heart: regular rate and rhythm Lungs: diminished breath sounds bibasilar Abdomen: normal findings: soft, non-tender no air leak  Lab Results:  Recent Labs  02/26/15 0449  WBC 8.0  HGB 12.2  HCT 38.3  PLT 224   BMET:  Recent Labs  02/26/15 0449  NA 142  K 4.0  CL 109  CO2 26  GLUCOSE 122*  BUN <5*  CREATININE 0.74  CALCIUM 8.2*    PT/INR: No results for input(s): LABPROT, INR in the last 72 hours. ABG    Component Value Date/Time   PHART 7.397 02/26/2015 0220   HCO3 27.3* 02/26/2015 0220   TCO2 28.7 02/26/2015 0220   ACIDBASEDEF 0.5 02/19/2015 1133   O2SAT 95.3 02/26/2015 0220   CBG (last 3)  No results for input(s): GLUCAP in the last 72 hours.  Assessment/Plan: S/P Procedure(s) (LRB): VIDEO ASSISTED THORACOSCOPY (VATS)/ LUL WEDGE RESECTION with ON Q placement. (Left) POD # 1   CV- vagal episode this AM. HR and BP OK at present  RESP- no air  leak- CT to water seal. ~ 300 ml out since surgery  IS  RENAL- creatinine and lytes Ok  DVT prophylaxis- SCD + enoxaparin  Ambulate  Transfer to 3 S when bed available   LOS: 1 day    Melrose Nakayama 02/26/2015

## 2015-02-26 NOTE — Op Note (Signed)
NAMELEATH, Taylor NO.:  [id number]  MEDICAL RECORD NO.:  [id number]  LOCATION:  2S04C                        FACILITY:  [facility]  PHYSICIAN:  Revonda Standard. Roxan Hockey, M.D.DATE OF BIRTH:  September 05, 1955  DATE OF PROCEDURE:  02/25/2015 DATE OF DISCHARGE:                              OPERATIVE REPORT   PREOPERATIVE DIAGNOSIS:  Left upper lobe nodule.  POSTOPERATIVE DIAGNOSIS:  Benign tumor, left upper lobe.  PROCEDURE:   Left video-assisted thoracoscopy Left upper lobe wedge resection On-Q local anesthetic catheter placement.  SURGEON:  Revonda Standard. Roxan Hockey, M.D.  ASSISTANT:  Suzzanne Cloud, P.A.  ANESTHESIA:  General.  FINDINGS:  Nodule easily palpable on lateral aspect of left upper lobe. 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  CLINICAL NOTE:  Taylor Walsh is a 59 year old woman with a history of tobacco abuse, who was recently found to have a 1-cm nodule in the left upper lobe.  This was mildly hypermetabolic by PET-CT.  She was offered the options of continued radiographic followup versus surgical resection.  The indications, risks, benefits and alternatives of each of those options were discussed with her.  After discussion, she wished to proceed with surgical resection for definitive diagnosis with a plan for a curative resection if this turned out to be a lung cancer.  She understood and accepted the risks and wished to proceed.  OPERATIVE NOTE:  Mrs. Taylor Walsh was brought to the preoperative holding area on February 25, 2015.  Anesthesia placed a central line and arterial blood pressure monitoring line.  She was taken to the operating room, anesthetized and intubated with a double-lumen endotracheal tube. Intravenous antibiotics were administered.  A Foley catheter was placed. Sequential compressive devices were placed on the calves for DVT prophylaxis.  She was placed in a right lateral decubitus position and the left  chest was prepped and draped in the usual sterile fashion.  Single lung ventilation of the right lung was initiated and was tolerated well throughout the procedure.  After performing a surgical time-out, an incision was made in the midaxillary line and approximately the seventh intercostal space, a 5-mm port was inserted into the chest and the thoracoscope was advanced into the chest.  There was good isolation of the left lung.  A 5-cm working incision was made in the left anterolateral chest in the third interspace.  No rib spreading was performed during the procedure.  The upper lobe was grasped and the nodule was easily palpable on the lateral aspect of the upper lobe.  A wedge resection was performed with sequential firings of an endoscopic stapler (an Ethicon Powered 45-mm stapler with gold cartridges).  A 1 cm gross margin was maintained on the nodule.  The specimen was removed from the chest and sent for frozen section.  There was good hemostasis at the staple line. The inferior ligament was divided with electrocautery.  A level 9 lymph node was removed and sent as a separate specimen.  An On-Q local anesthetic catheter was placed through a separate stab-type incision posteriorly and tunneled into the subpleural location.  It was primed with 10 mL of 0.5% Marcaine.  The frozen section returned showing a  benign fibroelastic tumor with a clear margin.  A final inspection was made.  There was no bleeding from the staple line or the incisions.  A 28-French chest tube was placed through the original port incision and secured with a #1 silk suture. The left lung was reinflated.  The utility incision was closed in standard fashion with a #1 Vicryl fascial suture, 2-0 Vicryl subcutaneous suture and a 3-0 Vicryl subcuticular suture.  Dermabond was applied to the incision.  The chest tube was placed to suction.  The patient was placed back in the supine position.  She was extubated in the  operating room and taken to the postanesthetic care unit in good condition.     Revonda Standard Roxan Hockey, M.D.     SCH/MEDQ  D:  02/25/2015  T:  02/26/2015  Job:  [id number]

## 2015-02-27 ENCOUNTER — Inpatient Hospital Stay (HOSPITAL_COMMUNITY): Payer: BC Managed Care – PPO

## 2015-02-27 LAB — COMPREHENSIVE METABOLIC PANEL
ALK PHOS: 50 U/L (ref 38–126)
ALT: 23 U/L (ref 14–54)
AST: 23 U/L (ref 15–41)
Albumin: 2.9 g/dL — ABNORMAL LOW (ref 3.5–5.0)
Anion gap: 5 (ref 5–15)
BUN: 7 mg/dL (ref 6–20)
CALCIUM: 8.4 mg/dL — AB (ref 8.9–10.3)
CO2: 29 mmol/L (ref 22–32)
CREATININE: 0.75 mg/dL (ref 0.44–1.00)
Chloride: 106 mmol/L (ref 101–111)
Glucose, Bld: 135 mg/dL — ABNORMAL HIGH (ref 65–99)
Potassium: 3.8 mmol/L (ref 3.5–5.1)
Sodium: 140 mmol/L (ref 135–145)
TOTAL PROTEIN: 5.9 g/dL — AB (ref 6.5–8.1)
Total Bilirubin: 0.7 mg/dL (ref 0.3–1.2)

## 2015-02-27 LAB — CBC
HCT: 37.2 % (ref 36.0–46.0)
HEMOGLOBIN: 11.9 g/dL — AB (ref 12.0–15.0)
MCH: 27.9 pg (ref 26.0–34.0)
MCHC: 32 g/dL (ref 30.0–36.0)
MCV: 87.1 fL (ref 78.0–100.0)
PLATELETS: 222 10*3/uL (ref 150–400)
RBC: 4.27 MIL/uL (ref 3.87–5.11)
RDW: 15.2 % (ref 11.5–15.5)
WBC: 7.7 10*3/uL (ref 4.0–10.5)

## 2015-02-27 NOTE — Discharge Instructions (Signed)
Video Assisted Thoracoscopy, Care After °Refer to this sheet in the next few weeks. These instructions provide you with information on caring for yourself after your procedure. Your caregiver may also give you more specific instructions. Your procedure has been planned according to current medical practices, but problems sometimes occur. Call your caregiver if you have any problems or questions after your procedure. °HOME CARE INSTRUCTIONS  °· Only take over-the-counter or prescription medications as directed. °· Only take pain medications (narcotics) as directed. °· Do not drive until your caregiver approves. Driving while taking narcotics or soon after surgery can be dangerous, so discuss the specific timing with your caregiver. °· Avoid activities that use your chest muscles, such as lifting heavy objects, for at least 3-4 weeks.   °· Take deep breaths to expand the lungs and to protect against pneumonia. °· Do breathing exercises as directed by your caregiver. If you were given an incentive spirometer to help with breathing, use it as directed. °· You may resume a normal diet and activities when you feel you are able to or as directed. °· Do not take a bath until your caregiver says it is OK. Use the shower instead.   °· Keep the bandage (dressing) covering the area where the chest tube was inserted (incision site) dry for 48 hours. After 48 hours, remove the dressing unless there is new drainage. °· Remove dressings as directed by your caregiver. °· Change dressings if necessary or as directed. °· Keep all follow-up appointments. It is important for you to see your caregiver after surgery to discuss appropriate follow-up care and surveillance, if it is necessary. °SEEK MEDICAL CARE: °· You feel excessive or increasing pain at an incision site. °· You notice bleeding, skin irritation, drainage, swelling, or redness at an incision site. °· There is a bad smell coming from an incision or dressing. °· It feels like  your heart is fluttering or beating rapidly. °· Your pain medication does not relieve your pain. °SEEK IMMEDIATE MEDICAL CARE IF:  °· You have a fever.   °· You have chest pain.  °· You have a rash. °· You have shortness of breath. °· You have trouble breathing.   °· You feel weak, lightheaded, dizzy, or faint.   °MAKE SURE YOU:  °· Understand these instructions.   °· Will watch your condition.   °· Will get help right away if you are not doing well or get worse. °  °This information is not intended to replace advice given to you by your health care provider. Make sure you discuss any questions you have with your health care provider. °  °Document Released: 07/11/2012 Document Revised: 04/06/2014 Document Reviewed: 07/11/2012 °Elsevier Interactive Patient Education ©2016 [employer] ° °

## 2015-02-27 NOTE — Discharge Summary (Signed)
Physician Discharge Summary  Patient ID: Taylor Walsh MRN: [id number] DOB/AGE: July 12, 1955 59 y.o.  Admit date: 02/25/2015 Discharge date: 03/01/2015  Admission Diagnoses:  Patient Active Problem List   Diagnosis Date Noted  . Nodule of left lung 02/25/2015  . Hypertension 01/28/2015  . Hypothyroidism 01/28/2015  . GERD (gastroesophageal reflux disease) 01/28/2015  . 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 Nodule of left lung 02/25/2015  . Hypertension 01/28/2015  . Hypothyroidism 01/28/2015  . GERD (gastroesophageal reflux disease) 01/28/2015  . Lung mass 01/07/2015   Discharged Condition: good  History of Present Illness:  Taylor Walsh is a 59 yo female with known history of tobacco abuse, who quit 01/04/2015.  She was referred to [facility] after being found to have a left upper lobe lung nodule.  About a month ago the patient developed shortness of breath while lying flat, mainly when she was trying to sleep.  She described this as something trying to close her airway.  She also felt similar symptoms during exertion associated with sexual intercourse.  She was evaluated by Dr. Brigitte Pulse who obtained PFTs, which were unrevealing.  Further work up with CT scan of the chest showed a nodule of the left apex.  PET CT revealed the nodule to be mildly hypermetabolic.  There was also an area in her right axilla, however there was nothing on CT scan to correlate with this.  She was evaluated by Dr. Roxan Hockey on 01/28/2015.  During that visit the patient stated her symptoms have improved.  She does have occasional wheezing.  She denied cough and hemoptysis.  She did not have any unusual weight loss.  Her imaging studies were reviewed and various treatment approaches were discussed with the patient.  Ultimately she decided to proceed with surgical resection.  The risks and benefits of the  procedure were explained to the patient and she was agreeable to proceed, with surgery scheduled for 02/25/2015.  Hospital Course:   Taylor Walsh presented to [facility] on [facility]/25/[facility].  She was taken to the operating room and underwent Left VATS with Left Upper Lobe Wedge Resection and placement of ON-Q pain catheter.  She tolerated the procedure, without difficulty, was extubated and taken to the SICU in stable condition.  During her stay in the ICU the patient had a near syncopal episode due to a Vagal Response.  Her chest tube did not exhibit air leak and had minimal output since surgery.  Her chest xray was free from pneumothorax.  Her chest tubes were transitioned to water seal.  She continues to make progress.  Her chest tube remains free from air leak on water seal and was removed without difficulty.  She is ambulating and felt medically stable for transfer to the telemetry unit.  Her pain is well controlled.  Repeat CXR will be obtained in the AM, should this remain stable the patient will be medically stable for discharge home 03/01/2015.       Significant Diagnostic Studies: nuclear medicine:   1. Non-hypermetabolic (max SUV 1.5) subsolid 1.0 cm peripheral left upper lobe pulmonary nodule, suggesting a benign etiology. Given the small nodule size near the PET threshold, subsolid attenuation and lack of prior imaging, a follow-up routine unenhanced chest CT is advised in 6-12 months to document stability of this nodule, as an indolent neoplasm is not entirely excluded. 2. No hypermetabolic thoracic adenopathy or distant  metastatic disease. 3. Hypermetabolic 1.8 cm dermal lesion in the lateral upper right chest near the axilla, nonspecific, possibly an inflamed sebaceous cyst, however recommend correlation with clinical exam to exclude a melanoma.  Treatments: surgery:   Left video-assisted thoracoscopy, left upper lobe wedge resection, On-Q local anesthetic catheter  placement.  Disposition: Home  Discharge medications:     Medication List    TAKE these medications        aspirin 81 MG tablet  Take 81 mg by mouth daily.     atorvastatin 40 MG tablet  Commonly known as:  LIPITOR  Take 40 mg by mouth daily.     calcium-vitamin D 250-100 MG-UNIT tablet  Take 2 tablets by mouth daily.     ergocalciferol 50000 UNITS capsule  Commonly known as:  VITAMIN D2  Take 50,000 Units by mouth once a week.     levothyroxine 88 MCG tablet  Commonly known as:  SYNTHROID, LEVOTHROID  Take 88 mcg by mouth.     multivitamin with minerals tablet  Take 1 tablet by mouth daily.     traMADol 50 MG tablet  Commonly known as:  ULTRAM  Take 1-2 tablets (50-100 mg total) by mouth every 6 (six) hours as needed (mild pain).     valACYclovir 1000 MG tablet  Commonly known as:  VALTREX  Take 1,000 mg by mouth daily as needed.     valsartan 160 MG tablet  Commonly known as:  DIOVAN  Take 160 mg by mouth daily.       Follow-up Information    Follow up with Melrose Nakayama, MD On 03/12/2015.   Specialty:  Cardiothoracic Surgery   Why:  Appointment is at 12:45   Contact information:   [address] [place] [address] [phone]       Follow up with [facility] In 2 weeks.   Why:  Please get a CXR 30 min prior to appointment with Dr. Ollen Gross information:   [facility]       Follow up with [facility] RN On 03/08/2015.   Why:  For suture removal at 9:30      Signed: BARRETT, ERIN 03/01/2015, 9:42 AM

## 2015-02-27 NOTE — Progress Notes (Signed)
2 Days Post-Op Procedure(s) (LRB): VIDEO ASSISTED THORACOSCOPY (VATS)/ LUL WEDGE RESECTION with ON Q placement. (Left) Subjective: Some incisional pain overnight  Objective: Vital signs in last 24 hours: Temp:  [98.5 F (36.9 C)-98.8 F (37.1 C)] 98.8 F (37.1 C) (11/30 0751) Pulse Rate:  [40-93] 82 (11/30 0700) Cardiac Rhythm:  [-] Normal sinus rhythm (11/30 0000) Resp:  [17-32] 21 (11/30 0700) BP: (103-152)/(47-79) 122/69 mmHg (11/30 0700) SpO2:  [96 %-100 %] 99 % (11/30 0700) Arterial Line BP: (105)/(73) 105/73 mmHg (11/29 0800) Weight:  [198 lb 3.1 oz (89.9 kg)] 198 lb 3.1 oz (89.9 kg) (11/30 0500)  Hemodynamic parameters for last 24 hours:    Intake/Output from previous day: 11/29 0701 - 11/30 0700 In: 1500 [P.O.:240; I.V.:1260] Out: 3110 [Urine:3000; Chest Tube:110] Intake/Output this shift:    General appearance: alert, cooperative and no distress Neurologic: intact Heart: regular rate and rhythm Lungs: diminished breath sounds bibasilar Abdomen: normal findings: soft, non-tender Wound: clean and dry No air leak  Lab Results:  Recent Labs  02/26/15 0449 02/27/15 0318  WBC 8.0 7.7  HGB 12.2 11.9*  HCT 38.3 37.2  PLT 224 222   BMET:  Recent Labs  02/26/15 0449 02/27/15 0318  NA 142 140  K 4.0 3.8  CL 109 106  CO2 26 29  GLUCOSE 122* 135*  BUN <5* 7  CREATININE 0.74 0.75  CALCIUM 8.2* 8.4*    PT/INR: No results for input(s): LABPROT, INR in the last 72 hours. ABG    Component Value Date/Time   PHART 7.397 02/26/2015 0220   HCO3 27.3* 02/26/2015 0220   TCO2 28.7 02/26/2015 0220   ACIDBASEDEF 0.5 02/19/2015 1133   O2SAT 95.3 02/26/2015 0220   CBG (last 3)  No results for input(s): GLUCAP in the last 72 hours.  Assessment/Plan: S/P Procedure(s) (LRB): VIDEO ASSISTED THORACOSCOPY (VATS)/ LUL WEDGE RESECTION with ON Q placement. (Left) Plan for transfer to step-down: see transfer orders   POD # 2 No air leak, minimal drainage- dc CT Dc  PCA after CT out Continue ambulation SCD + enoxaparin for DVT prophylaxis Path pending Dc Foley Transfer to [place] or [place]   LOS: 2 days    Melrose Nakayama 02/27/2015

## 2015-02-27 NOTE — Progress Notes (Signed)
Pt transferred to 2W09 with SCDs, receiving staff in the room.

## 2015-02-27 NOTE — Progress Notes (Signed)
02/27/2015 1200 Received pt to room 2w09 a transfer from 2S.  Pt is A&O, no c/o pain.  Tele applied and CCMD notified.  Oriented to room, call light and bed.  Call bell in reach.  Family at bedside. [place]

## 2015-02-28 ENCOUNTER — Inpatient Hospital Stay (HOSPITAL_COMMUNITY): Payer: BC Managed Care – PPO

## 2015-02-28 MED ORDER — TRAMADOL HCL 50 MG PO TABS: 50.0000 mg | ORAL_TABLET | Freq: Four times a day (QID) | ORAL | Status: DC | PRN

## 2015-02-28 NOTE — Progress Notes (Addendum)
       [address]       [place] [address]             [phone]          3 Days Post-Op Procedure(s) (LRB): VIDEO ASSISTED THORACOSCOPY (VATS)/ LUL WEDGE RESECTION with ON Q placement. (Left)  Subjective: Feels well except "a little swimmy-headed" earlier. Breathing stable.   Objective: Vital signs in last 24 hours: Patient Vitals for the past 24 hrs:  BP Temp Temp src Pulse Resp SpO2  02/28/15 0551 129/65 mmHg 98.5 F (36.9 C) Oral 87 18 99 %  02/27/15 2004 (!) 119/55 mmHg 99 F (37.2 C) Oral 90 20 97 %  02/27/15 1216 130/76 mmHg 98.5 F (36.9 C) Oral 86 (!) 200 100 %  02/27/15 1100 116/71 mmHg - - 77 (!) 21 98 %  02/27/15 1000 124/74 mmHg - - 81 14 100 %  02/27/15 0900 128/74 mmHg - - 88 15 100 %  02/27/15 0800 - - - - (!) 21 99 %  02/27/15 0751 - 98.8 F (37.1 C) Oral - - -   Current Weight  02/27/15 198 lb 3.1 oz (89.9 kg)     Intake/Output from previous day: 11/30 0701 - 12/01 0700 In: 660 [P.O.:600; I.V.:60] Out: 650 [Urine:550; Chest Tube:100]    PHYSICAL EXAM:  Heart: RRR Lungs: Clear Wound: Clean and dry    Lab Results: CBC: Recent Labs  02/26/15 0449 02/27/15 0318  WBC 8.0 7.7  HGB 12.2 11.9*  HCT 38.3 37.2  PLT 224 222   BMET:  Recent Labs  02/26/15 0449 02/27/15 0318  NA 142 140  K 4.0 3.8  CL 109 106  CO2 26 29  GLUCOSE 122* 135*  BUN <5* 7  CREATININE 0.74 0.75  CALCIUM 8.2* 8.4*    PT/INR: No results for input(s): LABPROT, INR in the last 72 hours.    Assessment/Plan: S/P Procedure(s) (LRB): VIDEO ASSISTED THORACOSCOPY (VATS)/ LUL WEDGE RESECTION with ON Q placement. (Left) CXR with stable apical ptx since CT d/c'ed. Pt otherwise stable and doing well. Path remains pending. Will saline lock IVF, d/c central line.  Hopefully home later today if she remains stable.   LOS: 3 days    COLLINS,GINA H 02/28/2015  Patient seen and examined, agree with above Will keep overnight and re check  CXR in AM- home if stable  Deontra Pereyra C. Roxan Hockey, MD [facility] [phone]

## 2015-02-28 NOTE — Progress Notes (Signed)
UR Completed. Amiah Frohlich, RN, BSN.  [phone] 

## 2015-02-28 NOTE — Progress Notes (Signed)
UR Completed. Remingtyn Depaola, RN, BSN.  [phone] 

## 2015-03-01 ENCOUNTER — Inpatient Hospital Stay (HOSPITAL_COMMUNITY): Payer: BC Managed Care – PPO

## 2015-03-01 NOTE — Progress Notes (Signed)
Patient to D/C home with daughter. D/C education done with patient. IV removed. Tele box removed. Patient wheeled to front entrance to D/C with daughter.   Domingo Dimes RN

## 2015-03-01 NOTE — Progress Notes (Signed)
       [address]       [place] [address]             [phone]          4 Days Post-Op Procedure(s) (LRB): VIDEO ASSISTED THORACOSCOPY (VATS)/ LUL WEDGE RESECTION with ON Q placement. (Left)  Subjective: Feels better today. No more dizziness.  Walking in halls without problems.    Objective: Vital signs in last 24 hours: Patient Vitals for the past 24 hrs:  BP Temp Temp src Pulse Resp SpO2  03/01/15 0435 115/76 mmHg 97.9 F (36.6 C) Oral (!) 57 18 99 %  02/28/15 2140 (!) 143/73 mmHg 98.5 F (36.9 C) Oral 79 18 97 %  02/28/15 1320 (!) 147/73 mmHg 98.4 F (36.9 C) Oral 81 19 96 %   Current Weight  02/27/15 198 lb 3.1 oz (89.9 kg)     Intake/Output from previous day: 12/01 0701 - 12/02 0700 In: 480 [P.O.:480] Out: -     PHYSICAL EXAM:  Heart: RRR Lungs: Clear Wound: Clean and dry     Lab Results: CBC: Recent Labs  02/27/15 0318  WBC 7.7  HGB 11.9*  HCT 37.2  PLT 222   BMET:  Recent Labs  02/27/15 0318  NA 140  K 3.8  CL 106  CO2 29  GLUCOSE 135*  BUN 7  CREATININE 0.75  CALCIUM 8.4*    PT/INR: No results for input(s): LABPROT, INR in the last 72 hours.  CXR: FINDINGS: There is persistent approximately 10% left-sided pneumothorax. Stable increased interstitial density in the left upper lobe is observed. There is no pleural effusion. The heart and pulmonary vascularity are normal. The mediastinum is normal in width and is not shifted. The bony thorax exhibits no acute abnormality.  IMPRESSION: Approximately 10% left-sided pneumothorax. This has decreased somewhat since the study of November 30th and slightly smaller than on yesterday's study. The study is otherwise stable.   Assessment/Plan: S/P Procedure(s) (LRB): VIDEO ASSISTED THORACOSCOPY (VATS)/ LUL WEDGE RESECTION with ON Q placement. (Left) CXR stable and improving. Plan d/c home. Instructions reviewed with patient. Path remains pending, will follow  up as outpatient.   LOS: 4 days    Leonidus Rowand H 03/01/2015

## 2015-03-08 ENCOUNTER — Encounter (INDEPENDENT_AMBULATORY_CARE_PROVIDER_SITE_OTHER): Payer: Self-pay

## 2015-03-08 DIAGNOSIS — R911 Solitary pulmonary nodule: Secondary | ICD-10-CM

## 2015-03-08 DIAGNOSIS — R918 Other nonspecific abnormal finding of lung field: Secondary | ICD-10-CM

## 2015-03-11 ENCOUNTER — Other Ambulatory Visit: Payer: Self-pay | Admitting: Thoracic Surgery (Cardiothoracic Vascular Surgery)

## 2015-03-11 DIAGNOSIS — R918 Other nonspecific abnormal finding of lung field: Secondary | ICD-10-CM

## 2015-03-12 ENCOUNTER — Encounter: Payer: Self-pay | Admitting: Thoracic Surgery (Cardiothoracic Vascular Surgery)

## 2015-03-12 ENCOUNTER — Ambulatory Visit (INDEPENDENT_AMBULATORY_CARE_PROVIDER_SITE_OTHER): Payer: Self-pay | Admitting: Thoracic Surgery (Cardiothoracic Vascular Surgery)

## 2015-03-12 ENCOUNTER — Other Ambulatory Visit: Payer: Self-pay | Admitting: *Deleted

## 2015-03-12 ENCOUNTER — Ambulatory Visit
Admission: RE | Admit: 2015-03-12 | Discharge: 2015-03-12 | Disposition: A | Discharge status: Home / Self Care | Payer: BC Managed Care – PPO | Source: Ambulatory Visit | Attending: Thoracic Surgery (Cardiothoracic Vascular Surgery) | Admitting: Thoracic Surgery (Cardiothoracic Vascular Surgery)

## 2015-03-12 VITALS — BP 130/84 | HR 82 | Resp 16 | Ht 65.0 in | Wt 196.0 lb

## 2015-03-12 DIAGNOSIS — R911 Solitary pulmonary nodule: Secondary | ICD-10-CM

## 2015-03-12 DIAGNOSIS — R918 Other nonspecific abnormal finding of lung field: Secondary | ICD-10-CM

## 2015-03-12 DIAGNOSIS — Z09 Encounter for follow-up examination after completed treatment for conditions other than malignant neoplasm: Secondary | ICD-10-CM

## 2015-03-12 DIAGNOSIS — J984 Other disorders of lung: Secondary | ICD-10-CM

## 2015-03-12 DIAGNOSIS — G8918 Other acute postprocedural pain: Secondary | ICD-10-CM

## 2015-03-12 MED ORDER — TRAMADOL HCL 50 MG PO TABS: 50.0000 mg | ORAL_TABLET | Freq: Four times a day (QID) | ORAL | Status: DC | PRN

## 2015-03-12 NOTE — Progress Notes (Signed)
[address]       [place] [address]             [phone]       HPI: Taylor Walsh returns today for follow-up after her recent left video-assisted thoracoscopic wedge resection. She is a 59 year old lady with a history of tobacco use who was having problems with shortness of breath about a month ago. The shortness of breath resolved. During that workup of that she was found to have a 1 cm left upper lobe nodule. It was mildly hypermetabolic by PET CT with SUV of 1.5.  I did a left VATS and wedge resection on 02/25/2015. This turned out to be a benign fibroblastic nodule that tested positive for amyloid. She did well postoperatively and was discharged on day 4.  She still has some incisional pain. She's primarily using tramadol at night. She'll occasionally needed during the day. She does notice some shortness of breath with exertion.  Past Medical History  Diagnosis Date  . Anxiety   . Hypertension   . Dyslipidemia   . Hypothyroid   . Arthritis   . Heart murmur   . GERD (gastroesophageal reflux disease)     nexium if needed  . Osteopenia      Current Outpatient Prescriptions  Medication Sig Dispense Refill  . aspirin 81 MG tablet Take 81 mg by mouth daily.    Marland Kitchen atorvastatin (LIPITOR) 40 MG tablet Take 40 mg by mouth daily.    . calcium-vitamin D 250-100 MG-UNIT tablet Take 2 tablets by mouth daily.     . ergocalciferol (VITAMIN D2) 50000 UNITS capsule Take 50,000 Units by mouth once a week.    . levothyroxine (SYNTHROID, LEVOTHROID) 88 MCG tablet Take 88 mcg by mouth.    . Multiple Vitamins-Minerals (MULTIVITAMIN WITH MINERALS) tablet Take 1 tablet by mouth daily.    . valACYclovir (VALTREX) 1000 MG tablet Take 1,000 mg by mouth daily as needed.    . valsartan (DIOVAN) 160 MG tablet Take 160 mg by mouth daily.    . traMADol (ULTRAM) 50 MG tablet Take 1-2 tablets (50-100 mg total) by mouth every 6 (six) hours as needed (mild pain). 30 tablet 0   No  current facility-administered medications for this visit.    Physical Exam BP 130/84 mmHg  Pulse 82  Resp 16  Ht 5\' 5"  (1.651 m)  Wt 196 lb (88.905 kg)  BMI 32.61 kg/m59 59 year old woman in no acute distress Alert and oriented 3 with no focal deficits Incisions well healed Lungs clear with equal breath sounds bilaterally Cardiac regular rate and rhythm normal S1 and S2  Diagnostic Tests:  PathologyNAL DIAGNOSIS Diagnosis 1. 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 - NO ATYPIA OR MALIGNANCY IDENTIFIED. - SEE COMMENT. 2. Lymph node, biopsy, 12 L - ONE BENIGN LYMPH NODE WITH NO TUMOR SEEN (0/1). 3. Lymph node, biopsy, 9 L - ONE BENIGN LYMPH NODE WITH NO TUMOR SEEN (0/1).  I personally reviewed her chest x-ray. Shows postoperative changes.  Impression: Taylor Walsh is a 59 year old woman who had a left upper lobe lung nodule that turned out to be an amyloid nodule. She has done well postoperatively. She does still have some incisional pain. I gave her another prescription for tramadol as needed.  I advised her not to drive until she is no longer needing to take the  tramadol during the day. After that she may begin driving with appropriate precautions as discussed. Other than that her activities are unrestricted that she should build into them cautiously.  I advised her to wait until after the first the year to return to work.  She will follow-up with Dr. Brigitte Pulse to see if any additional workup is needed based on the finding of amyloid.  Plan: Follow-up with Dr. Brigitte Pulse.  I will be happy to see her back any time if I can be of any further assistance with her care.  Melrose Nakayama, MD [facility] [phone]

## 2015-03-22 ENCOUNTER — Other Ambulatory Visit (HOSPITAL_COMMUNITY): Payer: Self-pay | Admitting: Internal Medicine

## 2015-03-22 DIAGNOSIS — R0600 Dyspnea, unspecified: Secondary | ICD-10-CM

## 2015-03-22 DIAGNOSIS — E859 Amyloidosis, unspecified: Secondary | ICD-10-CM

## 2015-03-28 ENCOUNTER — Other Ambulatory Visit: Payer: Self-pay

## 2015-03-28 ENCOUNTER — Ambulatory Visit (HOSPITAL_COMMUNITY): Payer: BC Managed Care – PPO | Attending: Cardiovascular Disease

## 2015-03-28 DIAGNOSIS — F17200 Nicotine dependence, unspecified, uncomplicated: Secondary | ICD-10-CM | POA: Diagnosis not present

## 2015-03-28 DIAGNOSIS — E859 Amyloidosis, unspecified: Secondary | ICD-10-CM | POA: Diagnosis not present

## 2015-03-28 DIAGNOSIS — I10 Essential (primary) hypertension: Secondary | ICD-10-CM | POA: Diagnosis not present

## 2015-03-28 DIAGNOSIS — E785 Hyperlipidemia, unspecified: Secondary | ICD-10-CM | POA: Insufficient documentation

## 2015-03-28 DIAGNOSIS — R0600 Dyspnea, unspecified: Secondary | ICD-10-CM | POA: Diagnosis not present

## 2015-06-24 ENCOUNTER — Encounter: Payer: Self-pay | Admitting: Pulmonary Disease

## 2015-06-24 ENCOUNTER — Ambulatory Visit (INDEPENDENT_AMBULATORY_CARE_PROVIDER_SITE_OTHER): Payer: BC Managed Care – PPO | Admitting: Pulmonary Disease

## 2015-06-24 VITALS — BP 136/78 | HR 93 | Ht 65.0 in | Wt 199.2 lb

## 2015-06-24 DIAGNOSIS — E854 Organ-limited amyloidosis: Secondary | ICD-10-CM | POA: Insufficient documentation

## 2015-06-24 DIAGNOSIS — R0600 Dyspnea, unspecified: Secondary | ICD-10-CM | POA: Insufficient documentation

## 2015-06-24 DIAGNOSIS — J99 Respiratory disorders in diseases classified elsewhere: Principal | ICD-10-CM

## 2015-06-24 DIAGNOSIS — E858 Other amyloidosis: Secondary | ICD-10-CM

## 2015-06-24 MED ORDER — AZITHROMYCIN 250 MG PO TABS: ORAL_TABLET | ORAL | Status: DC

## 2015-06-24 NOTE — Assessment & Plan Note (Signed)
She describes some mild dyspnea with exertion that has come on in the last several months along with about a 40 pound weight gain. I agree with her that the weight gain is probably the cause for this.  However, on my personal review of the images of her CT chest from October 2016 there is a diffuse groundglass haziness and no specific distribution. While this may just be mild atelectatic changes, the differential diagnosis includes an atypical viral infection at the time, less likely pulmonary edema, less likely a nonspecific interstitial pneumonitis or some form of desquamative interstitial pneumonitis related to her tobacco use. Considering the fact that she had totally normal pulmonary function testing, a normal echocardiogram, and normal oxygenation I think that there is no evidence of a pulmonary or cardiac abnormality be too concerned about at this time but I think she needs further monitoring considering these abnormalities on CT chest.  Plan: Weight loss was encouraged Follow-up 6 months, if shortness of breath has not improved and will repeat pulmonary function testing and consider high-resolution CT chest

## 2015-06-24 NOTE — Assessment & Plan Note (Signed)
She had a wedge resection in November 2016 which showed a small scar with "amyloid deposition" confirmed with Congo red staining in multiple pathologist taking a look at the slides.  Fortunately, there seems to be no evidence of pulmonary amyloid on the rest of the biopsy and she is relatively asymptomatic right now with the exception of some mild dyspnea, see below. Amyloid can take many different forms with pulmonary involvement including airwave involvement, rarely some pulmonic parenchymal disease, and sometimes pulmonary hypertension. At this time because there seems to be no evidence of a systemic form of amyloidosis I have a low index of suspicion that this will recur or cause further problems. However, considering her dyspnea in the mild abnormality seen on her CT chest not described by radiology I think it's worth keeping an eye on her.  Plan: See dyspnea I'm going to obtain records from her primary care physician's office where she was worked up for potential causes of systemic amyloidosis Follow-up with me in 6 months

## 2015-06-24 NOTE — Patient Instructions (Signed)
Take the Z-Pak as prescribed I want you to work on exercise and diet to try to lose weight Come back in 6 months, if your shortness of breath has not improved then we will repeat lung function testing, come back sooner if the shortness of breath worsens.

## 2015-06-24 NOTE — Progress Notes (Signed)
Subjective:    Patient ID: Taylor Walsh, female    DOB: 1955-05-03, 60 y.o.   MRN: [id number]  HPI  Chief Complaint  Patient presents with  . Advice Only    Referred by Dr. Brigitte Pulse for follow up on amyloid mass in lung.  Pt c/o occasional pain at sx incision site, sinus congestion, pnd.      This is a pleasant 60 year old female with a past medical history significant for tobacco abuse and states that she has had an open lung biopsy which showed amyloidosis. She has been referred to my clinic by her primary care physician today for further evaluation of the same.  She had a normal childhood without respiratory illnesses though she said she was diagnosed with a heart murmur at age 55 that has never been a problem for her. Unfortunately she started smoking cigarettes in her 44s and she smoked one pack daily for 40 years up until last year. She began having some orthopnea several years ago after a fall and she mentioned it to her physician last year. She had a lung function test which was suggestive of "possible pulmonary vascular disease" so she ended up having a CT angiogram of her chest. This showed no evidence of a pulmonary embolism but there is a 1.3 lobular nodule in the left upper lobe. This led to a PET/CT because of her smoking history. The PET/CT showed hyper activity. She ended up going for an open lung biopsy performed by Dr. Roxan Hockey which showed evidence of amyloidosis. The official read from 02/25/2015 is as follows:  - [place] (1.2 CM LUNG NODULE). 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 - NO ATYPIA OR MALIGNANCY IDENTIFIED.  After this she apparently had blood work done as part of a workup for amyloidosis and she tells me this was negative. She had an echocardiogram performed which showed a normal LVEF and no evidence of cardiac amyloid. Her echocardiogram showed normal RV size and function and no evidence of elevated  pulmonary pressures.  She tells me that in general she's been doing fairly well. However, she says that she gets short of breath when she climbs a flight of stairs at her office. She has always worked in a [employer]. She says that she does not cough and she does not wheeze. She has not noticed if she has the orthopnea because she has adjusted her sleeping patterns. She says she thinks that she's become short of breath again because she gained weight after surgery, 40 pounds. She did quit smoking around the time of surgery and she has not started back. She denies leg swelling or chest pain.    Past Medical History  Diagnosis Date  . Anxiety   . Hypertension   . Dyslipidemia   . Hypothyroid   . Arthritis   . Heart murmur   . GERD (gastroesophageal reflux disease)     nexium if needed  . Osteopenia      Family History  Problem Relation Age of Onset  . Cancer Mother     brain, ovarian  . Stroke Father   . Cancer Sister     breast  . Cancer Brother   . Hypertension Mother   . Hypertension Father   . Hypertension Sister      Social History   Social History  . Marital Status: Single    Spouse Name: N/A  . Number of Children: 2  . Years of Education: N/A  Occupational History  . Not on file.   Social History Main Topics  . Smoking status: Former Smoker -- 1.00 packs/day for 42 years    Quit date: 02/16/2015  . Smokeless tobacco: Never Used  . Alcohol Use: No  . Drug Use: No  . Sexual Activity: Not on file   Other Topics Concern  . Not on file   Social History Narrative     Allergies  Allergen Reactions  . Vicodin [Hydrocodone-Acetaminophen] Swelling     Outpatient Prescriptions Prior to Visit  Medication Sig Dispense Refill  . aspirin 81 MG tablet Take 81 mg by mouth daily.    Marland Kitchen atorvastatin (LIPITOR) 40 MG tablet Take 40 mg by mouth daily.    . calcium-vitamin D 250-100 MG-UNIT tablet Take 2 tablets by mouth daily.     . ergocalciferol (VITAMIN  D2) 50000 UNITS capsule Take 50,000 Units by mouth once a week.    . levothyroxine (SYNTHROID, LEVOTHROID) 88 MCG tablet Take 88 mcg by mouth.    . Multiple Vitamins-Minerals (MULTIVITAMIN WITH MINERALS) tablet Take 1 tablet by mouth daily.    . traMADol (ULTRAM) 50 MG tablet Take 1-2 tablets (50-100 mg total) by mouth every 6 (six) hours as needed (mild pain). 30 tablet 0  . valACYclovir (VALTREX) 1000 MG tablet Take 1,000 mg by mouth daily as needed.    . valsartan (DIOVAN) 160 MG tablet Take 160 mg by mouth daily.     No facility-administered medications prior to visit.      Review of Systems  Constitutional: Negative for fever and unexpected weight change.  HENT: Negative for congestion, dental problem, ear pain, nosebleeds, postnasal drip, rhinorrhea, sinus pressure, sneezing, sore throat and trouble swallowing.   Eyes: Negative for redness and itching.  Respiratory: Negative for cough, chest tightness, shortness of breath and wheezing.   Cardiovascular: Negative for palpitations and leg swelling.  Gastrointestinal: Negative for nausea and vomiting.  Genitourinary: Negative for dysuria.  Musculoskeletal: Negative for joint swelling.  Skin: Negative for rash.  Neurological: Negative for headaches.  Hematological: Does not bruise/bleed easily.  Psychiatric/Behavioral: Negative for dysphoric mood. The patient is not nervous/anxious.        Objective:   Physical Exam  Filed Vitals:   06/24/15 1033  BP: 136/78  Pulse: 93  Height: 5\' 5"  (1.651 m)  Weight: 199 lb 3.2 oz (90.357 kg)  SpO2: 95%   RA  Gen: well appearing, no acute distress HENT: NCAT, OP clear, neck supple without masses Eyes: PERRL, EOMi Lymph: no cervical lymphadenopathy PULM: Very faint crackles in bases, otherwise clear to auscultation with normal effort CV: RRR, no mgr, no JVD GI: BS+, soft, nontender, no hsm Derm: no rash or skin breakdown MSK: normal bulk and tone Neuro: A&Ox4, CN II-XII intact,  strength 5/5 in all 4 extremities Psyche: normal mood and affect  03/28/2015 echocardiogram shows a normal LVEF with no evidence of cardiac amyloid, there is a normal RV with no evidence of pulmonary hypertension 02/25/2015 left upper lobe wedge biopsy: - FIBROELASTOTIC SCAR WITH AMYLOID DEPOSITION (1.2 CM LUNG NODULE). 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 - NO ATYPIA OR MALIGNANCY IDENTIFIED. September 2016 pulmonary function testing ratio 80%, FEV1 2.21 L, 111% predicted no change with bronchodilator, FVC 2.50 L (98% predicted), total lung capacity 4.24 L (86% predicted), DLCO 18.01 (78% predicted).  CBC    Component Value Date/Time   WBC 7.7 02/27/2015 0318   WBC 5.5 01/14/2015 1403   RBC 4.27 02/27/2015  0318   RBC 4.95 01/14/2015 1403   HGB 11.9* 02/27/2015 0318   HGB 14.1 01/14/2015 1403   HCT 37.2 02/27/2015 0318   HCT 42.7 01/14/2015 1403   PLT 222 02/27/2015 0318   PLT 247 01/14/2015 1403   MCV 87.1 02/27/2015 0318   MCV 86.2 01/14/2015 1403   MCH 27.9 02/27/2015 0318   MCH 28.4 01/14/2015 1403   MCHC 32.0 02/27/2015 0318   MCHC 33.0 01/14/2015 1403   RDW 15.2 02/27/2015 0318   RDW 14.6* 01/14/2015 1403   LYMPHSABS 2.0 01/14/2015 1403   MONOABS 0.4 01/14/2015 1403   EOSABS 0.1 01/14/2015 1403   BASOSABS 0.0 01/14/2015 1403          Assessment & Plan:  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  Fortunately, there seems to be no evidence of pulmonary amyloid on the rest of the biopsy and she is relatively asymptomatic right now with the exception of some mild dyspnea, see below. Amyloid can take many different forms with pulmonary involvement including airwave involvement, rarely some pulmonic parenchymal disease, and sometimes pulmonary hypertension. At this time because there  seems to be no evidence of a systemic form of amyloidosis I have a low index of suspicion that this will recur or cause further problems. However, considering her dyspnea in the mild abnormality seen on her CT chest not described by radiology I think it's worth keeping an eye on her.  Plan: See dyspnea I'm going to obtain records from her primary care physician's office where she was worked up for potential causes of systemic amyloidosis Follow-up with me in 6 months  Dyspnea She describes some mild dyspnea with exertion that has come on in the last several months along with about a 40 pound weight gain. I agree with her that the weight gain is probably the cause for this.  However, on my personal review of the images of her CT chest from October 2016 there is a diffuse groundglass haziness and no specific distribution. While this may just be mild atelectatic changes, the differential diagnosis includes an atypical viral infection at the time, less likely pulmonary edema, less likely a nonspecific interstitial pneumonitis or some form of desquamative interstitial pneumonitis related to her tobacco use. Considering the fact that she had totally normal pulmonary function testing, a normal echocardiogram, and normal oxygenation I think that there is no evidence of a pulmonary or cardiac abnormality be too concerned about at this time but I think she needs further monitoring considering these abnormalities on CT chest.  Plan: Weight loss was encouraged Follow-up 6 months, if shortness of breath has not improved and will repeat pulmonary function testing and consider high-resolution CT chest     Current outpatient prescriptions:  .  aspirin 81 MG tablet, Take 81 mg by mouth daily., Disp: , Rfl:  .  atorvastatin (LIPITOR) 40 MG tablet, Take 40 mg by mouth daily., Disp: , Rfl:  .  calcium-vitamin D 250-100 MG-UNIT tablet, Take 2 tablets by mouth daily. , Disp: , Rfl:  .  ergocalciferol (VITAMIN D2)  50000 UNITS capsule, Take 50,000 Units by mouth once a week., Disp: , Rfl:  .  levothyroxine (SYNTHROID, LEVOTHROID) 88 MCG tablet, Take 88 mcg by mouth., Disp: , Rfl:  .  Multiple Vitamins-Minerals (MULTIVITAMIN WITH MINERALS) tablet, Take 1 tablet by mouth daily.,  Disp: , Rfl:  .  traMADol (ULTRAM) 50 MG tablet, Take 1-2 tablets (50-100 mg total) by mouth every 6 (six) hours as needed (mild pain)., Disp: 30 tablet, Rfl: 0 .  valACYclovir (VALTREX) 1000 MG tablet, Take 1,000 mg by mouth daily as needed., Disp: , Rfl:  .  valsartan (DIOVAN) 160 MG tablet, Take 160 mg by mouth daily., Disp: , Rfl:  .  azithromycin (ZITHROMAX) 250 MG tablet, 500mg  po daily x1 day, 250mg  po daily x4 days, Disp: 6 tablet, Rfl: 0

## 2015-06-24 NOTE — Progress Notes (Deleted)
   Subjective:    Patient ID: Taylor Walsh, female    DOB: Dec 17, 1955, 60 y.o.   MRN: [id number]  HPI    Review of Systems  Constitutional: Negative for fever and unexpected weight change.  HENT: Positive for congestion. Negative for dental problem, ear pain, nosebleeds, postnasal drip, rhinorrhea, sinus pressure, sneezing, sore throat and trouble swallowing.   Eyes: Negative for redness and itching.  Respiratory: Positive for shortness of breath. Negative for cough, chest tightness and wheezing.   Cardiovascular: Negative for palpitations and leg swelling.  Gastrointestinal: Negative for nausea and vomiting.  Genitourinary: Negative for dysuria.  Musculoskeletal: Negative for joint swelling.  Skin: Negative for rash.  Neurological: Negative for headaches.  Hematological: Does not bruise/bleed easily.  Psychiatric/Behavioral: Negative for dysphoric mood. The patient is not nervous/anxious.        Objective:   Physical Exam        Assessment & Plan:

## 2016-02-14 IMAGING — CR DG CHEST 1V PORT
1 series · 1 of 1 positions shown · non-contrast
Comparison: Portable chest x-ray February 26, 2015

CLINICAL DATA: Status post partial left upper lobectomy for
pulmonary nodule on February 25, 2015

EXAM:
PORTABLE CHEST 1 VIEW

[AP]
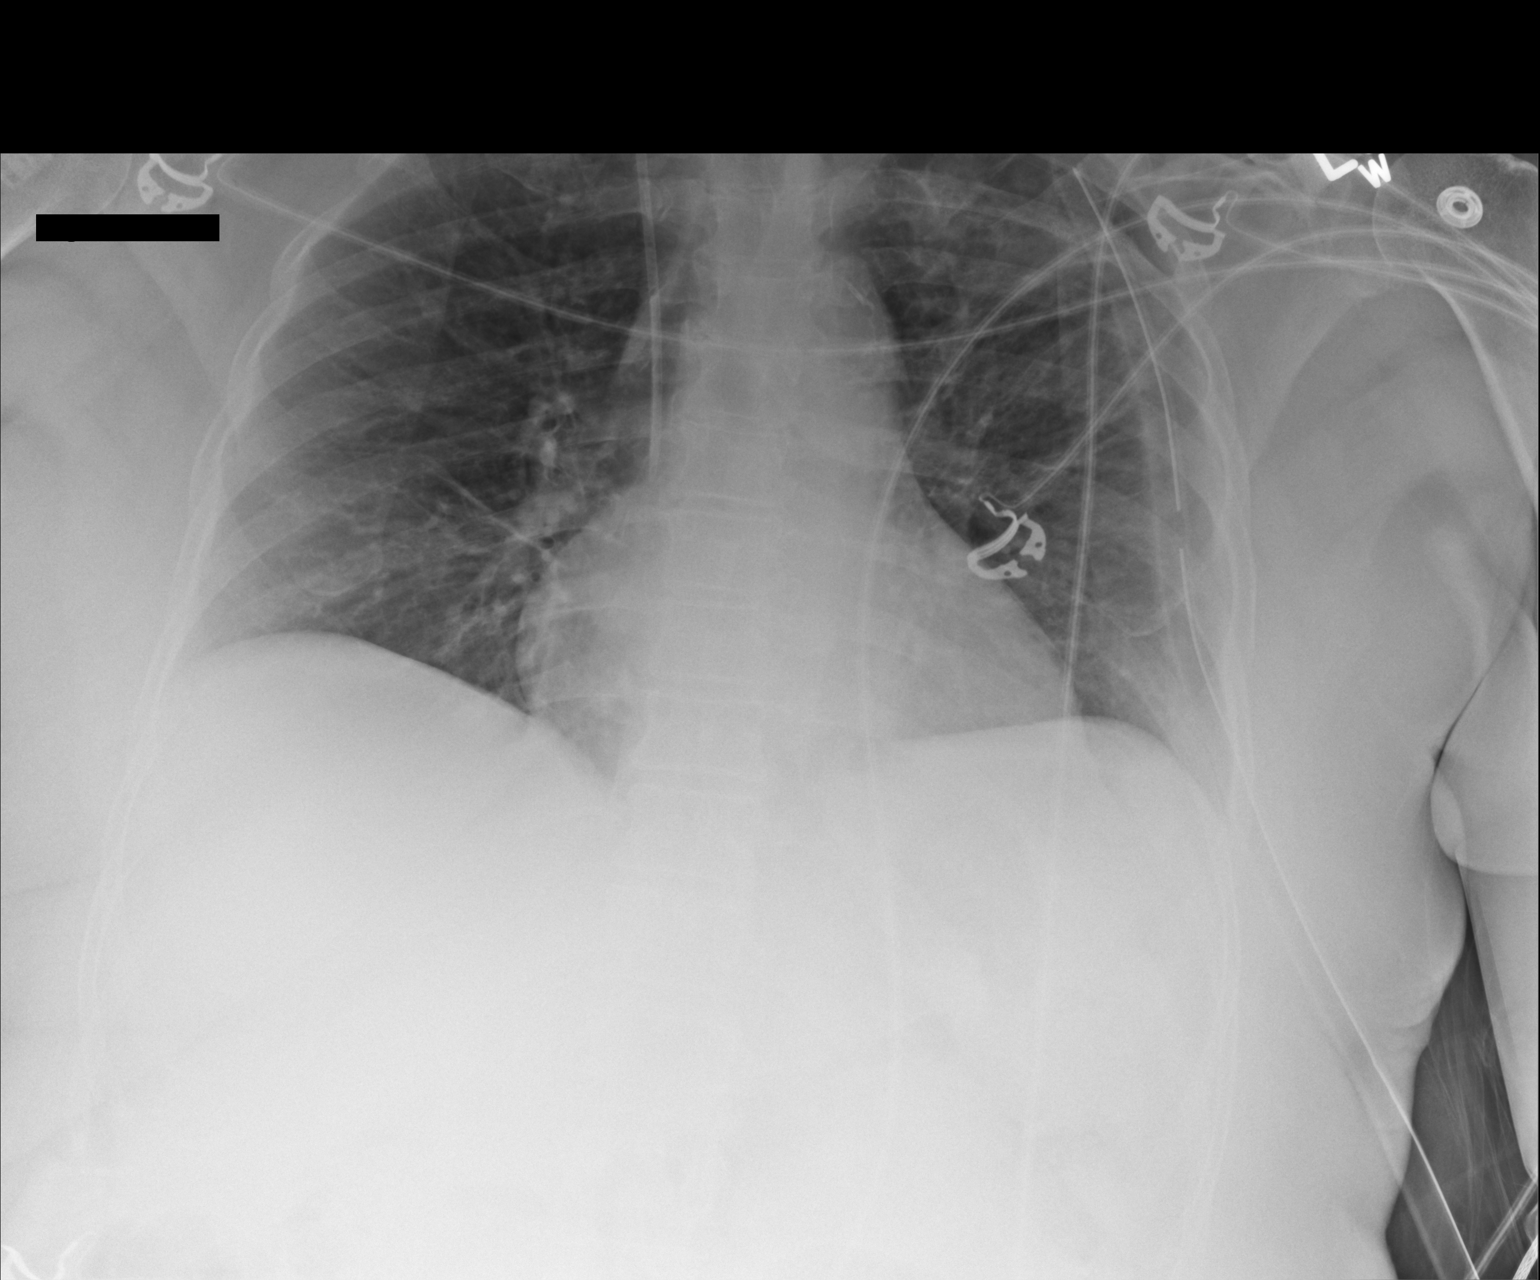

[1 of 1 positions shown; findings below may reference images not displayed]

FINDINGS: The lungs are adequately inflated. No pneumothorax is evident. There
is no pleural effusion. The heart is top-normal in size but stable.
The pulmonary vascularity is normal. The left-sided chest tube tip
projects over the posterior aspect of the third rib. The right
internal jugular venous catheter tip projects over the midportion of
the SVC.
IMPRESSION: Stable support apparatus. There is no pneumothorax nor evidence of
other acute cardiopulmonary abnormality.

## 2016-05-20 ENCOUNTER — Ambulatory Visit (INDEPENDENT_AMBULATORY_CARE_PROVIDER_SITE_OTHER): Payer: BC Managed Care – PPO | Admitting: Pulmonary Disease

## 2016-05-20 ENCOUNTER — Encounter: Payer: Self-pay | Admitting: Pulmonary Disease

## 2016-05-20 VITALS — BP 134/82 | HR 77 | Ht 65.0 in | Wt 195.0 lb

## 2016-05-20 DIAGNOSIS — R911 Solitary pulmonary nodule: Secondary | ICD-10-CM | POA: Diagnosis not present

## 2016-05-20 DIAGNOSIS — F17210 Nicotine dependence, cigarettes, uncomplicated: Secondary | ICD-10-CM | POA: Insufficient documentation

## 2016-05-20 DIAGNOSIS — E854 Organ-limited amyloidosis: Secondary | ICD-10-CM | POA: Diagnosis not present

## 2016-05-20 DIAGNOSIS — J99 Respiratory disorders in diseases classified elsewhere: Secondary | ICD-10-CM | POA: Diagnosis not present

## 2016-05-20 NOTE — Assessment & Plan Note (Addendum)
Counseled for 4 minutes today on the significance of ongoing tobacco use and I advised her strongly to stop smoking. She was provided with information regarding local and state resources for smoking cessation.

## 2016-05-20 NOTE — Progress Notes (Signed)
Subjective:    Patient ID: Taylor Walsh, female    DOB: 1956/03/01, 61 y.o.   MRN: [id number]  Synopsis: Diagnosed with pulmonary amyloidosis on an open lung biopsy in 2016.  She says that she has smoked as much as a pack a day.  She still smokes as of 04/2016, now only once every few weeks.  She cut down severely on smoking at the time of her lung biopsy.  Prior to that she had smoked 1ppd for 40 years.    HPI Chief Complaint  Patient presents with  . Follow-up    pt doing well, notes no breathing complaints.     [name] notes that she has not had any breathing problems in the last year.  She says that she had a stomach virus in December, but nothing since then.  She says that she has not had any cough, chest congestion or dyspnea.  No weight loss or chest paint.      Past Medical History:  Diagnosis Date  . Anxiety   . Arthritis   . Dyslipidemia   . GERD (gastroesophageal reflux disease)    nexium if needed  . Heart murmur   . Hypertension   . Hypothyroid   . Osteopenia       Review of Systems     Objective:   Physical Exam Vitals:   05/20/16 1510  BP: 134/82  Pulse: 77  SpO2: 100%  Weight: 195 lb (88.5 kg)  Height: 5\' 5"  (1.651 m)    Gen: well appearing HENT: OP clear, TM's clear, neck supple PULM: CTA B, normal percussion CV: RRR, no mgr, trace edema GI: BS+, soft, nontender Derm: no cyanosis or rash Psyche: normal mood and affect  02/2015 SPEP completely normal 03/28/2015 echocardiogram shows a normal LVEF with no evidence of cardiac amyloid, there is a normal RV with no evidence of pulmonary hypertension 02/25/2015 left upper lobe wedge biopsy: - FIBROELASTOTIC SCAR WITH AMYLOID DEPOSITION (1.2 CM LUNG NODULE). 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 - NO ATYPIA OR MALIGNANCY IDENTIFIED. September 2016 pulmonary function testing ratio 80%, FEV1 2.21 L, 111% predicted no change with bronchodilator, FVC 2.50 L (98% predicted), total  lung capacity 4.24 L (86% predicted), DLCO 18.01 (78% predicted).     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 However, given the relative rarity of this we have no guidelines to tell us how often to image patients.  I think the best approach moving for is to repeat a CT scan now and then quit and then we can reassess how frequently we will need to do this on an annual basis. At age 86 Medicare will start to pay for lung cancer screening civic and enroll her in that at that time.  Plan: CT scan of the chest to assess for pulmonary amyloidosis  Cigarette smoker Counseled for 4 minutes today on the significance of ongoing tobacco use and I advised her strongly to stop smoking. She was provided with information regarding local and state resources for smoking cessation.    Current Outpatient Prescriptions:  .  aspirin 81 MG tablet, Take 81 mg by mouth daily., Disp: , Rfl:  .  atorvastatin (LIPITOR) 40 MG tablet, Take 40 mg by mouth daily., Disp: , Rfl:  .  calcium-vitamin D 250-100 MG-UNIT tablet, Take 2 tablets by mouth daily. , Disp: , Rfl:  .  ergocalciferol (VITAMIN D2) 50000  UNITS capsule, Take 50,000 Units by mouth once a week., Disp: , Rfl:  .  levothyroxine (SYNTHROID, LEVOTHROID) 88 MCG tablet, Take 88 mcg by mouth., Disp: , Rfl:  .  Multiple Vitamins-Minerals (MULTIVITAMIN WITH MINERALS) tablet, Take 1 tablet by mouth daily., Disp: , Rfl:  .  traMADol (ULTRAM) 50 MG tablet, Take 1-2 tablets (50-100 mg total) by mouth every 6 (six) hours as needed (mild pain)., Disp: 30 tablet, Rfl: 0 .  valACYclovir (VALTREX) 1000 MG tablet, Take 1,000 mg by mouth daily as needed., Disp: , Rfl:  .  valsartan (DIOVAN) 160 MG tablet, Take 160 mg by mouth daily., Disp: , Rfl:

## 2016-05-20 NOTE — Assessment & Plan Note (Signed)
From a clinical standpoint there has been no evidence of progression of this disease. However, given the relative rarity of this we have no guidelines to tell us how often to image patients.  I think the best approach moving for is to repeat a CT scan now and then quit and then we can reassess how frequently we will need to do this on an annual basis. At age 61 Medicare will start to pay for lung cancer screening civic and enroll her in that at that time.  Plan: CT scan of the chest to assess for pulmonary amyloidosis

## 2016-05-20 NOTE — Patient Instructions (Signed)
Stop smoking altogether Review the information I provided you regarding smoking cessation resources in the state of [place] and in our community We will arrange for a CT scan to evaluate the pulmonary amyloidosis I will plan on seeing you back in one year or sooner if needed

## 2016-05-27 ENCOUNTER — Ambulatory Visit (INDEPENDENT_AMBULATORY_CARE_PROVIDER_SITE_OTHER)
Admission: RE | Admit: 2016-05-27 | Discharge: 2016-05-27 | Disposition: A | Discharge status: Home / Self Care | Payer: BC Managed Care – PPO | Source: Ambulatory Visit | Attending: Pulmonary Disease | Admitting: Pulmonary Disease

## 2016-05-27 DIAGNOSIS — J99 Respiratory disorders in diseases classified elsewhere: Secondary | ICD-10-CM

## 2016-05-27 DIAGNOSIS — E854 Organ-limited amyloidosis: Secondary | ICD-10-CM

## 2016-05-27 DIAGNOSIS — R911 Solitary pulmonary nodule: Secondary | ICD-10-CM

## 2016-05-28 NOTE — Progress Notes (Signed)
Left message to contact office for medical results.

## 2016-05-29 ENCOUNTER — Telehealth: Payer: Self-pay | Admitting: Pulmonary Disease

## 2016-05-29 NOTE — Telephone Encounter (Signed)
Notes Recorded by Juanito Doom, MD on 05/28/2016 at 1:14 PM EST A, Please let the patient know this was OK Thanks, B --------------------------------- Spoke with pt. She is aware of results of her CT chest. Nothing further was needed.

## 2016-07-06 ENCOUNTER — Other Ambulatory Visit: Payer: Self-pay | Admitting: Obstetrics and Gynecology

## 2016-07-06 DIAGNOSIS — R928 Other abnormal and inconclusive findings on diagnostic imaging of breast: Secondary | ICD-10-CM

## 2016-07-09 ENCOUNTER — Ambulatory Visit
Admission: RE | Admit: 2016-07-09 | Discharge: 2016-07-09 | Disposition: A | Discharge status: Home / Self Care | Payer: BC Managed Care – PPO | Source: Ambulatory Visit | Attending: Obstetrics and Gynecology | Admitting: Obstetrics and Gynecology

## 2016-07-09 ENCOUNTER — Other Ambulatory Visit: Payer: Self-pay | Admitting: Obstetrics and Gynecology

## 2016-07-09 DIAGNOSIS — R921 Mammographic calcification found on diagnostic imaging of breast: Secondary | ICD-10-CM

## 2016-07-09 DIAGNOSIS — R928 Other abnormal and inconclusive findings on diagnostic imaging of breast: Secondary | ICD-10-CM

## 2016-07-10 ENCOUNTER — Ambulatory Visit
Admission: RE | Admit: 2016-07-10 | Discharge: 2016-07-10 | Disposition: A | Discharge status: Home / Self Care | Payer: BC Managed Care – PPO | Source: Ambulatory Visit | Attending: Obstetrics and Gynecology | Admitting: Obstetrics and Gynecology

## 2016-07-10 DIAGNOSIS — R921 Mammographic calcification found on diagnostic imaging of breast: Secondary | ICD-10-CM

## 2016-07-15 ENCOUNTER — Encounter: Payer: Self-pay | Admitting: General Surgery

## 2016-07-16 ENCOUNTER — Other Ambulatory Visit: Payer: Self-pay | Admitting: General Surgery

## 2016-07-16 ENCOUNTER — Encounter: Payer: Self-pay | Admitting: Radiation Oncology

## 2016-07-16 DIAGNOSIS — D493 Neoplasm of unspecified behavior of breast: Secondary | ICD-10-CM

## 2016-07-17 ENCOUNTER — Encounter: Payer: Self-pay | Admitting: Hematology

## 2016-07-17 ENCOUNTER — Telehealth: Payer: Self-pay | Admitting: Hematology

## 2016-07-17 NOTE — Telephone Encounter (Signed)
Appt has been scheduled for the pt to see Dr. Burr Medico on 5/2 at 11am. Pt aware to arrive 15 minutes early.  Pt voiced understanding. Letter mailed to the pt.

## 2016-07-21 ENCOUNTER — Other Ambulatory Visit: Payer: BC Managed Care – PPO

## 2016-07-22 DIAGNOSIS — C50212 Malignant neoplasm of upper-inner quadrant of left female breast: Secondary | ICD-10-CM | POA: Insufficient documentation

## 2016-07-23 ENCOUNTER — Telehealth: Payer: Self-pay | Admitting: Hematology

## 2016-07-23 ENCOUNTER — Encounter: Payer: Self-pay | Admitting: Hematology

## 2016-07-23 NOTE — Progress Notes (Signed)
Location of Breast Cancer: Left Breast  Histology per Pathology Report:  07/10/16 Diagnosis 1. 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 - SEE MICROSCOPIC DESCRIPTION.  Receptor Status: ER (95%), PR (80%).  2. 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  Receptor Status: ER(95%), PR (90%)  Did patient present with symptoms or was this found on screening mammography?: It was found on a screening mammogram.   Past/Anticipated interventions by surgeon, if any: Dr. Dalbert Batman 07/16/16 Consult. He was waiting for the MRI results (completed 07/27/16). She will need to have another appointment scheduled.    Past/Anticipated interventions by medical oncology, if any:  Dr. Burr Medico 07/28/16  Lymphedema issues, if any:  N/A  Pain issues, if any:  She denies  SAFETY ISSUES:  Prior radiation? She received a radiation pill for her thyroid in approximately 2011.  Pacemaker/ICD? No  Possible current pregnancy? No  Is the patient on methotrexate? No  Current Complaints / other details:   MRI Breast 07/27/16  BP (!) 145/72   Pulse 77   Temp 98.1 F (36.7 C)   Ht 5\' 5"  (1.651 m)   Wt 195 lb 3.2 oz (88.5 kg)   SpO2 95% Comment: room air  BMI 32.48 kg/m    Wt Readings from Last 3 Encounters:  07/28/16 195 lb 3.2 oz (88.5 kg)  05/20/16 195 lb (88.5 kg)  06/24/15 199 lb 3.2 oz (90.4 kg)      Johan Creveling, Stephani Police, RN 07/23/2016,3:22 PM

## 2016-07-23 NOTE — Telephone Encounter (Signed)
Tc to the pt to reschedule her appt to 5/1 instead of 5/2 per Dr. Burr Medico at 11am. Left the pt a vm w/appt date and time. Will mail a letter.

## 2016-07-27 ENCOUNTER — Ambulatory Visit
Admission: RE | Admit: 2016-07-27 | Discharge: 2016-07-27 | Disposition: A | Discharge status: Home / Self Care | Payer: BC Managed Care – PPO | Source: Ambulatory Visit | Attending: General Surgery | Admitting: General Surgery

## 2016-07-27 DIAGNOSIS — D493 Neoplasm of unspecified behavior of breast: Secondary | ICD-10-CM

## 2016-07-27 MED ORDER — GADOBENATE DIMEGLUMINE 529 MG/ML IV SOLN
18.0000 mL | Freq: Once | INTRAVENOUS | Status: AC | PRN
  Administered 2016-07-27: 18 mL via INTRAVENOUS

## 2016-07-27 NOTE — Progress Notes (Signed)
[facility]  [phone] Fax:([phone] [place] Note   Patient Care Team: Marton Redwood, MD as PCP - General (Internal Medicine) Melrose Nakayama, MD as Consulting Physician (Cardiothoracic Surgery) 07/28/2016  Referring physician: Dr. Fanny Skates  CHIEF COMPLAINTS/PURPOSE OF CONSULTATION: 07/28/16 Medical oncology consultation for newly diagnosed Ductal carcinoma in situ (DCIS) of left breast  Oncology History   Cancer Staging Ductal carcinoma in situ (DCIS) of left breast Staging form: Breast, AJCC 8th Edition - Clinical stage from 07/10/2016: Stage 0 (cTis (DCIS), cN0, cM0, G3, ER: Positive, PR: Positive, HER2: Not Assessed) - Signed by Truitt Merle, MD on 07/28/2016       Ductal carcinoma in situ (DCIS) of left breast   07/10/2016 Initial Biopsy    Diagnosis 1. 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 2. 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      07/10/2016 Receptors her2    ER 95%, PR 80-90%+, both strong staining, Ki67 80-90%      07/22/2016 Initial Diagnosis    Ductal carcinoma in situ (DCIS) of left breast     07/27/2016 Imaging    Breast MRI w wo contrast showed  1. Extensive mass and non mass enhancement along the lower inner aspect the left breast, lower outer quadrant, spanning 10.2 cm from anterior to posterior, including both biopsied lesions, as detailed above. This is all consistent with DCIS. 2. No other abnormal enhancement in the left breast. 3. No abnormal or enlarged axillary lymph nodes. 4. No evidence of malignancy in the right breast        HISTORY OF PRESENTING ILLNESS(07/28/16):  Taylor Walsh 61 y.o. 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 She presents to  the clinic today by herself. She was referred by her surgeon Dr. Dalbert Batman.   This was discovered by routine screening mammogram. She denies any palpable breast mass or adenopathy. She denies any other new symptoms. She was contacted for her need of a biopsy to further investigate the calcification found in her mammogram. A biopsy was done leading to a diagnosis of her DCIS. She reports having arthritis in the right knee. She reported having a tubal ligation and hysterectomy. She also has famaily history of breast cancer with mother and sister. She reports to live with her daughter and 2 of her grandchildren. She reports of having history of HTN, arthritis, Thyroidism, being Pre-diabetic, Osteopenia and is currently back to Smoking. She also reports her brother has a history of blood clots and he was told that it was hereditary which she is following up with Dr. Brigitte Pulse about testing.   GYN HISTORY  Menarchal: 11 LMP: 36 before hysterectomy Contraceptive: N/A HRT: no GP:G2P2    MEDICAL HISTORY:  Past Medical History:  Diagnosis Date  . Anxiety   . Arthritis   . Dyslipidemia   . GERD (gastroesophageal reflux disease)    nexium if needed  . Heart murmur   . Hypertension   . Hypothyroid   . Osteopenia     SURGICAL HISTORY: Past Surgical History:  Procedure Laterality Date  . ABDOMINAL HYSTERECTOMY    . FOOT SURGERY     bi-lat/ repaired hammer toes  . TUBAL LIGATION    . VIDEO ASSISTED THORACOSCOPY (VATS)/WEDGE RESECTION Left 02/25/2015  Procedure: VIDEO ASSISTED THORACOSCOPY (VATS)/ LUL WEDGE RESECTION with ON Q placement.;  Surgeon: Melrose Nakayama, MD;  Location: [facility];  Service: Thoracic;  Laterality: Left;    SOCIAL HISTORY: Social History   Social History  . Marital status: Single    Spouse name: N/A  . Number of children: 2  . Years of education: N/A   Occupational History  . Not on file.   Social History Main Topics  . Smoking status: Former Smoker    Packs/day:  1.00    Years: 42.00    Quit date: 02/16/2015  . Smokeless tobacco: Never Used     Comment: 5/1/18she smokes occasionally when stressed. She is smoking 4 cigarettes a week  . Alcohol use No  . Drug use: No  . Sexual activity: Not on file   Other Topics Concern  . Not on file   Social History Narrative  . No narrative on file    FAMILY HISTORY: Family History  Problem Relation Age of Onset  . Cancer Mother     brain, ovarian  . Hypertension Mother   . Stroke Father   . Hypertension Father   . Cancer Sister     breast  . Cancer Brother     prostate  . Hypertension Sister   . Breast cancer Sister   . Cancer Brother     prostate    ALLERGIES:  is allergic to vicodin [hydrocodone-acetaminophen].  MEDICATIONS:  Current Outpatient Prescriptions  Medication Sig Dispense Refill  . aspirin 81 MG tablet Take 81 mg by mouth daily.    Marland Kitchen atorvastatin (LIPITOR) 40 MG tablet Take 40 mg by mouth daily.    . calcium-vitamin D 250-100 MG-UNIT tablet Take 2 tablets by mouth daily.     . ergocalciferol (VITAMIN D2) 50000 UNITS capsule Take 50,000 Units by mouth once a week.    . levothyroxine (SYNTHROID, LEVOTHROID) 88 MCG tablet Take 88 mcg by mouth.    . Multiple Vitamins-Minerals (MULTIVITAMIN WITH MINERALS) tablet Take 1 tablet by mouth daily.    . valACYclovir (VALTREX) 1000 MG tablet Take 1,000 mg by mouth daily as needed.    . valsartan (DIOVAN) 160 MG tablet Take 160 mg by mouth daily.    . traMADol (ULTRAM) 50 MG tablet Take 1-2 tablets (50-100 mg total) by mouth every 6 (six) hours as needed (mild pain). (Patient not taking: Reported on 07/28/2016) 30 tablet 0   No current facility-administered medications for this visit.     REVIEW OF SYSTEMS:  Constitutional: Denies fevers, chills (+) night sweats Eyes: Denies blurriness of vision, double vision or watery eyes Ears, nose, mouth, throat, and face: Denies mucositis or sore throat Respiratory: Denies cough, dyspnea or  wheezes Cardiovascular: Denies palpitation, chest discomfort or lower extremity swelling Gastrointestinal:  Denies nausea, heartburn or change in bowel habits Skin: Denies abnormal skin rashes Lymphatics: Denies new lymphadenopathy or easy bruising Musculoskeletal: (+) arthritis in the right knee Neurological:Denies numbness, tingling or new weaknesses Behavioral/Psych: Mood is stable, no new changes  All other systems were reviewed with the patient and are negative.  PHYSICAL EXAMINATION: ECOG PERFORMANCE STATUS: 0 - Asymptomatic  Vitals:   07/28/16 1126  BP: 128/63  Pulse: 77  Resp: 17  Temp: 98.1 F (36.7 C)   Filed Weights   07/28/16 1126  Weight: 195 lb (88.5 kg)     GENERAL:alert, no distress and comfortable SKIN: skin color, texture, turgor are normal, no rashes or significant lesions EYES: normal, conjunctiva are  pink and non-injected, sclera clear OROPHARYNX:no exudate, no erythema and lips, buccal mucosa, and tongue normal  NECK: supple, thyroid normal size, non-tender, without nodularity LYMPH:  no palpable lymphadenopathy in the cervical, axillary or inguinal LUNGS: clear to auscultation and percussion with normal breathing effort HEART: regular rate & rhythm and no murmurs and no lower extremity edema ABDOMEN:abdomen soft, non-tender and normal bowel sounds Musculoskeletal:no cyanosis of digits and no clubbing  PSYCH: alert & oriented x 3 with fluent speech NEURO: no focal motor/sensory deficits Breasts: Breast inspection showed them to be symmetrical with no nipple discharge. (+) Two small nodules at biopsy side located lower-inner quadrant and next to nipple of left breast. Possible hematoma from biopsy.  LABORATORY DATA:  I have reviewed the data as listed CBC Latest Ref Rng & Units 02/27/2015 02/26/2015 02/19/2015  WBC 4.0 - 10.5 K/uL 7.7 8.0 5.0  Hemoglobin 12.0 - 15.0 g/dL 11.9(L) 12.2 14.1  Hematocrit 36.0 - 46.0 % 37.2 38.3 42.4  Platelets 150 - 400  K/uL 222 224 224    CMP Latest Ref Rng & Units 02/27/2015 02/26/2015 02/19/2015  Glucose 65 - 99 mg/dL 135(H) 122(H) 99  BUN 6 - 20 mg/dL 7 <5(L) 10  Creatinine 0.44 - 1.00 mg/dL 0.75 0.74 0.75  Sodium 135 - 145 mmol/L 140 142 139  Potassium 3.5 - 5.1 mmol/L 3.8 4.0 4.3  Chloride 101 - 111 mmol/L 106 109 107  CO2 22 - 32 mmol/L _0 Calcium 8.9 - 10.3 mg/dL 8.4(L) 8.2(L) 9.2  Total Protein 6.5 - 8.1 g/dL 5.9(L) - 7.1  Total Bilirubin 0.3 - 1.2 mg/dL 0.7 - 0.9  Alkaline Phos 38 - 126 U/L 50 - 61  AST 15 - 41 U/L 23 - 31  ALT 14 - 54 U/L 23 - 35    PATHOLOGY:  Surgery: 07/10/16 Diagnosis 1. 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 - SEE MICROSCOPIC DESCRIPTION. 2. 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 - SEE MICROSCOPIC DESCRIPTION. Microscopic Comment 1. Prognostic markers have been ordered and will be reported in an addendum. 2. Prognostic markers have been ordered and will be reported in an addendum. Called to the [facility] on 07/13/16.  Results: IMMUNOHISTOCHEMICAL AND MORPHOMETRIC ANALYSIS PERFORMED MANUALLY Estrogen Receptor: 95%, POSITIVE, STRONG STAINING INTENSITY Progesterone Receptor: 80%, POSITIVE, STRONG STAINING INTENSITY  Biopsy: 02/25/15 Diagnosis 1. 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 - NO ATYPIA OR MALIGNANCY IDENTIFIED. - SEE COMMENT. 2. Lymph node, biopsy, 12 L - ONE BENIGN LYMPH NODE WITH NO TUMOR SEEN (0/1). 3. Lymph node, biopsy, 9 L - ONE BENIGN LYMPH NODE WITH NO TUMOR SEEN (0/1). Microscopic Comment 1. A Congo Red stain is performed and a crystal violet stain is  performed on the lung specimen. The staining pattern (apple green birefringence) coupled with the morphology is consistent with amyloid deposition. Dr. Zenda Alpers, Dr. Fletcher Anon and Dr. Darl Householder have all seen this case in consultation with agreement that amyloid deposition is present. Dr. Donato Heinz has also seen this case in consultation with agreement of the additional findings in the case.  RADIOGRAPHIC STUDIES: I have personally reviewed the radiological images as listed and agreed with the findings in the report. Mr Breast Bilateral W  Wo Contrast  Result Date: 07/27/2016 CLINICAL DATA:  Patient had 2 areas in the left breast biopsied on 07/10/2016. These were areas of suspicious microcalcifications. The microcalcifications span and approximate 8.4 cm from anterior to posterior along the lower inner left breast. The anterior posterior extends of these calcifications were biopsied, both areas revealing high-grade ductal carcinoma in situ. Current exam is to evaluate extent of disease. LABS:  Creatinine was obtained on site at [facility] at [address] Results: Creatinine 0.8 mg/dL.  GFR 92 EXAM: BILATERAL BREAST MRI WITH AND WITHOUT CONTRAST TECHNIQUE: Multiplanar, multisequence MR images of both breasts were obtained prior to and following the intravenous administration of 18 ml of MultiHance. THREE-DIMENSIONAL MR IMAGE RENDERING ON INDEPENDENT WORKSTATION: Three-dimensional MR images were rendered by post-processing of the original MR data on an independent workstation. The three-dimensional MR images were interpreted, and findings are reported in the following complete MRI report for this study. Three dimensional images were evaluated at the independent DynaCad workstation COMPARISON:  Previous exam(s). FINDINGS: Breast composition: b. Scattered fibroglandular tissue. Background parenchymal enhancement: Minimal Right breast: Normal.  No mass or abnormal enhancement. Left breast: Abnormal enhancement  extends from the anterior aspect of the lower inner left breast, near the nipple, posteriorly, spanning a combined 10.2 cm from anterior to posterior. Along the anterior aspect, associated with the biopsy clip artifact, there is a mass of high T1 and T2 signal, consistent with post biopsy hemorrhage, with surrounding rim enhancement measuring, 3.1 x 2.2 cm transversely. A similar-appearing mass, associated with the posterior biopsy clip, also with rim of enhancement, measures 2.3 x 2.1 cm transversely. Between these 2, there is a nearly contiguous string of irregular enhancing masses and clumped non masslike enhancement. This entire area corresponds to the area of suspicious calcifications, and is all likely DCIS. There is no other abnormal enhancement in the left breast. Lymph nodes: No abnormal appearing lymph nodes. Ancillary findings:  None. IMPRESSION: 1. Extensive mass and non mass enhancement along the lower inner aspect the left breast, lower outer quadrant, spanning 10.2 cm from anterior to posterior, including both biopsied lesions, as detailed above. This is all consistent with DCIS. 2. No other abnormal enhancement in the left breast. 3. No abnormal or enlarged axillary lymph nodes. 4. No evidence of malignancy in the right breast. RECOMMENDATION: Treatment as planned for the known multifocal left breast ductal carcinoma in situ. BI-RADS CATEGORY  6: Known biopsy-proven malignancy. Electronically Signed   By: Lajean Manes M.D.   On: 07/27/2016 16:49   Mm Digital Diagnostic Unilat L  Result Date: 07/09/2016 CLINICAL DATA:  Screening recall for left breast calcifications. EXAM: DIGITAL DIAGNOSTIC LEFT MAMMOGRAM WITH CAD COMPARISON:  Previous exam(s). ACR Breast Density Category b: There are scattered areas of fibroglandular density. FINDINGS: Spot compression magnification views were performed over the lower inner left breast demonstrating calcifications varying in shape size and density spanning a  distance of approximately 8.4 cm and extending linearly towards the nipple. There is associated increased density associated with the more anterior calcifications. Mammographic images were processed with CAD. IMPRESSION: Highly suspicious left breast calcifications. RECOMMENDATION: Stereotactic guided biopsy of the the anterior and posterior extends of the calcifications (2 sites) is recommended to prove extent of disease. This is scheduled for Friday 07/10/2016 at 12:45 p.m. I have discussed the findings and recommendations with the patient. Results were also provided in writing at the conclusion of the visit. If applicable, a reminder letter will be sent to the patient  regarding the next appointment. BI-RADS CATEGORY  5: Highly suggestive of malignancy. Electronically Signed   By: Everlean Alstrom M.D.   On: 07/09/2016 15:42   Mm Clip Placement Left  Result Date: 07/10/2016 CLINICAL DATA:  Two stereotactic biopsies were performed of the medial left to evaluate suspicious calcifications in the upper inner quadrant extending toward the nipple. EXAM: DIAGNOSTIC LEFT MAMMOGRAM POST TWO STEREOTACTIC BIOPSIES COMPARISON:  Previous exam(s). FINDINGS: Mammographic images were obtained following STEREOTACTIC guided biopsy of calcifications in the medial left breast, upper inner quadrant. An X shaped biopsy clip is satisfactorily positioned along the posterior extent of the biopsied calcifications. A coil shaped biopsy clip is satisfactorily positioned along the anterior extent of the biopsied calcifications. The 2 clips are separated by approximately 7.3 cm on the CC projection. IMPRESSION: Satisfactory position of coil and X shaped biopsy clips in the left breast. These 2 clips are separated by 7 cm. Final Assessment: Post Procedure Mammograms for Marker Placement Electronically Signed   By: Curlene Dolphin M.D.   On: 07/10/2016 15:26   Mm Lt Breast Bx W Loc Dev 1st Lesion Image Bx Spec Stereo Guide  Addendum Date:  07/27/2016   ADDENDUM REPORT: 07/13/2016 11:28 ADDENDUM: Pathology revealed high grade ductal carcinoma in situ with necrosis and calcifications in the left breast, anterior and posterior. This was found to be concordant by Dr. Curlene Dolphin. Pathology results were discussed with the patient by telephone. The patient reported doing well after the biopsies with tenderness at the sites. Post biopsy instructions and care were reviewed and questions were answered. The patient was encouraged to call [facility] for any additional concerns. Surgical consultation has been arranged with Dr. Fanny Skates at [facility] on July 15, 2016. Pathology results reported by Susa Raring RN, BSN on 07/13/2016. Electronically Signed   By: Curlene Dolphin M.D.   On: 07/13/2016 11:28   Result Date: 07/27/2016 CLINICAL DATA:  Suspicious microcalcifications in the upper inner quadrant of the left breast. Biopsy was recommended of 2 separate areas, to evaluate anterior and posterior extensions. This report describes a biopsy of the more anterior aspect of calcifications. EXAM: LEFT BREAST STEREOTACTIC CORE NEEDLE BIOPSY COMPARISON:  Previous exams. FINDINGS: The patient and I discussed the procedure of stereotactic-guided biopsy including benefits and alternatives. We discussed the high likelihood of a successful procedure. We discussed the risks of the procedure including infection, bleeding, tissue injury, clip migration, and inadequate sampling. Informed written consent was given. The usual time out protocol was performed immediately prior to the procedure. Using sterile technique and 1% Lidocaine as local anesthetic, under stereotactic guidance, a 9 gauge vacuum assisted device was used to perform core needle biopsy of calcifications in the anterior third of the upper inner quadrant of the left breast, periareolar using a medial to lateral approach. Specimen radiograph was performed  showing multiple calcifications. Specimens with calcifications are identified for pathology. Lesion quadrant: Upper inner quadrant At the conclusion of the procedure, a coil tissue marker clip was deployed into the biopsy cavity. Follow-up 2-view mammogram was performed and dictated separately. IMPRESSION: Stereotactic-guided biopsy of the anterior extent of suspicious microcalcifications involving the upper inner quadrant of the left breast. No apparent complications. Electronically Signed: By: Curlene Dolphin M.D. On: 07/10/2016 14:52   Mm Lt Breast Bx W Loc Dev Ea Ad Lesion Img Bx Spec Stereo Guide  Addendum Date: 07/27/2016   ADDENDUM REPORT: 07/13/2016 11:28 ADDENDUM: Pathology revealed high grade ductal carcinoma  in situ with necrosis and calcifications in the left breast, anterior and posterior. This was found to be concordant by Dr. Curlene Dolphin. Pathology results were discussed with the patient by telephone. The patient reported doing well after the biopsies with tenderness at the sites. Post biopsy instructions and care were reviewed and questions were answered. The patient was encouraged to call [facility] for any additional concerns. Surgical consultation has been arranged with Dr. Fanny Skates at [facility] on July 15, 2016. Pathology results reported by Susa Raring RN, BSN on 07/13/2016. Electronically Signed   By: Curlene Dolphin M.D.   On: 07/13/2016 11:28   Result Date: 07/27/2016 CLINICAL DATA:  Stereotactic biopsy was recommended of 2 separate areas in the upper inner quadrant of the left breast given the extent of suspicious microcalcifications. This report describes the biopsy for the posterior extent of calcifications. EXAM: LEFT BREAST STEREOTACTIC CORE NEEDLE BIOPSY COMPARISON:  Previous exams. FINDINGS: The patient and I discussed the procedure of stereotactic-guided biopsy including benefits and alternatives. We discussed the high  likelihood of a successful procedure. We discussed the risks of the procedure including infection, bleeding, tissue injury, clip migration, and inadequate sampling. Informed written consent was given. The usual time out protocol was performed immediately prior to the procedure. Using sterile technique and 1% Lidocaine as local anesthetic, under stereotactic guidance, a 9 gauge vacuum assisted device was used to perform core needle biopsy of calcifications in the middle third of the upper inner quadrant of the left breast. These calcifications are the most posterior suspicious calcifications visualized. Biopsy was performed using a medial to lateral approach. Specimen radiograph was performed showing numerous calcifications. Specimens with calcifications are identified for pathology. Lesion quadrant: Upper inner quadrant At the conclusion of the procedure, a X shaped tissue marker clip was deployed into the biopsy cavity. Follow-up 2-view mammogram was performed and dictated separately. IMPRESSION: Stereotactic-guided biopsy of the left breast. No apparent complications. Electronically Signed: By: Curlene Dolphin M.D. On: 07/10/2016 15:23    ASSESSMENT & PLAN:  ZEFFIE BICKERT is 61 y.o. who present to the clinic with Ductal carcinoma in situ (DCIS) of left breast  1.Ductal carcinoma in situ (DCIS) of left breast, high grde, ER+/PR+ -Screening recall for left breast calcifications on 07/09/16 -On 07/10/16 she received a biopsy of left breast upper inner quadrant, anterior (near nipple) that found to have [place], HIGH NUCLEAR GRADE WITH NECROSIS AND CALCIFICATIONS. -I reviewed her breast MRI findings, which confirmed large area of mass and non-mass enhancement in the left breast, corresponding to the biopsy confirmed DCIS. -Based on her extensive family history of cancer we discussed going to Genetics for testing. I will set up for referral today. -We discussed that DCIS is noninvasive cancer,  will be cured by complete surgical resection. She was seen by breast surgeon Dr. Dalbert Batman, she likely need left mastectomy due to the extensive of her disease. -We discussed her at high risk of breast cancer in the future and risk reduction strategy -If she has mastectomy, she would not need adjuvant breast radiation. -Given her strongly positive/negative ER and PR, I do recommend antiestrogen therapy with tamoxifen or anastrozole which decrease her risk of future breast cancer by ~40%.  -The potential side effects of tamoxifen and aromatase inhibitor, which includes but not limited to, hot flash, skin and vaginal dryness, slightly increased risk of cardiovascular disease and cataract, small risk of thrombosis and endometrial cancer from tamoxifen,  muscular stiffness and arthralgia, aromatase inhibitor-induced osteopenia and osteoporosis, were discussed with her in great details. She voiced good understanding, and is interested.  -We also discussed that biopsy may have sampling limitation, we will review her surgical path, to see if she has any invasive carcinoma components. -We discussed breast cancer surveillance after she completes treatment, Including annual mammogram, breast exam every 6-12 months.  2.Arthritis -She experiences mild arthritis in LE, more specific to her right knew -She is pretty active so   3. Osteopenia: -Last bone density scan ordered by Dr Brigitte Pulse in the last 2 years -Will f/u with PCP and scans every 2 years  4. Smoking cessation -patient stopped smoking but after close friend passing she has started again smoking.  -She smokes approximately 1 pack a week.  -The patient and Dr. Isidore Moos have set a quit date for 5/4 and will follow    PLAN:  -Set up genetic testing ASAP, I sent a message to our genetic counseling -She will proceed with breast surgery  -f/u after surgery to finalize her adjuvant antiestrogen therapy  No orders of the defined types were placed in this  encounter.   All questions were answered. The patient knows to call the clinic with any problems, questions or concerns. I spent 55 minutes counseling the patient face to face. The total time spent in the appointment was 60 minutes and more than 50% was on counseling.     Truitt Merle, MD 07/28/2016   This document serves as a record of services personally performed by Truitt Merle, MD. It was created on her behalf by Joslyn Devon, a trained medical scribe. The creation of this record is based on the scribe's personal observations and the provider's statements to them. This document has been checked and approved by the attending provider.

## 2016-07-28 ENCOUNTER — Ambulatory Visit
Admission: RE | Admit: 2016-07-28 | Discharge: 2016-07-28 | Disposition: A | Discharge status: Home / Self Care | Payer: BC Managed Care – PPO | Source: Ambulatory Visit | Attending: Radiation Oncology | Admitting: Radiation Oncology

## 2016-07-28 ENCOUNTER — Ambulatory Visit (HOSPITAL_BASED_OUTPATIENT_CLINIC_OR_DEPARTMENT_OTHER): Payer: BC Managed Care – PPO | Admitting: Hematology

## 2016-07-28 ENCOUNTER — Encounter: Payer: Self-pay | Admitting: Hematology

## 2016-07-28 ENCOUNTER — Telehealth: Payer: Self-pay | Admitting: Hematology

## 2016-07-28 ENCOUNTER — Encounter: Payer: Self-pay | Admitting: Radiation Oncology

## 2016-07-28 ENCOUNTER — Encounter: Payer: Self-pay | Admitting: *Deleted

## 2016-07-28 VITALS — BP 128/63 | HR 77 | Temp 98.1°F | Resp 17 | Ht 65.0 in | Wt 195.0 lb

## 2016-07-28 DIAGNOSIS — Z87891 Personal history of nicotine dependence: Secondary | ICD-10-CM | POA: Diagnosis not present

## 2016-07-28 DIAGNOSIS — E039 Hypothyroidism, unspecified: Secondary | ICD-10-CM | POA: Diagnosis not present

## 2016-07-28 DIAGNOSIS — D0512 Intraductal carcinoma in situ of left breast: Secondary | ICD-10-CM

## 2016-07-28 DIAGNOSIS — I10 Essential (primary) hypertension: Secondary | ICD-10-CM | POA: Diagnosis not present

## 2016-07-28 DIAGNOSIS — M8580 Other specified disorders of bone density and structure, unspecified site: Secondary | ICD-10-CM | POA: Insufficient documentation

## 2016-07-28 DIAGNOSIS — E785 Hyperlipidemia, unspecified: Secondary | ICD-10-CM | POA: Diagnosis not present

## 2016-07-28 DIAGNOSIS — C50212 Malignant neoplasm of upper-inner quadrant of left female breast: Secondary | ICD-10-CM | POA: Insufficient documentation

## 2016-07-28 DIAGNOSIS — Z170 Estrogen receptor positive status [ER+]: Secondary | ICD-10-CM | POA: Diagnosis not present

## 2016-07-28 DIAGNOSIS — F419 Anxiety disorder, unspecified: Secondary | ICD-10-CM | POA: Diagnosis not present

## 2016-07-28 DIAGNOSIS — K219 Gastro-esophageal reflux disease without esophagitis: Secondary | ICD-10-CM | POA: Diagnosis not present

## 2016-07-28 DIAGNOSIS — Z803 Family history of malignant neoplasm of breast: Secondary | ICD-10-CM | POA: Diagnosis not present

## 2016-07-28 NOTE — Telephone Encounter (Signed)
No LOS - 5/1 - per Dr. Burr Medico patient good to leave no additional appts needed.

## 2016-07-28 NOTE — Progress Notes (Signed)
Radiation Oncology         [phone] ________________________________  Initial outpatient Consultation  Name: Taylor Walsh MRN: [id number]  Date: 07/28/2016  DOB: 06-03-1955  MV:[facility], Taylor Saxon, MD  Taylor Skates, MD   REFERRING PHYSICIAN: Fanny Skates, MD  DIAGNOSIS:    ICD-9-CM ICD-10-CM   1. 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 female who presented for routine screening mammogram and was found to have left breast calcifications. She was recalled for a diagnostic mammogram on 07/09/16 and this scan showed highly suspicious left breast calcifications. Biopsy of the left breast upper inner quadrant anterior and posterior showed ductal carcinoma in situ, high nuclear grade with necrosis and calcifications with characteristics as described above in the diagnosis. The anterior biopsy was ER 95% / PR 80%. The posterior biopsy was ER 95% / PR 90%.  Bilateral Breast MRI was performed on 07/27/16 showing extensive mass and non mass enhancement along the lower inner aspect of the left breast, lower outer quadrant, spanning 10.2 cm from anterior to posterior, including both biopsied lesions. This is all consistent with DCIS. There were no other abnormal enhancement in the left breast, no abnormal or enlarged axillary lymph nodes, and no evidence of malignancy in the right breast.  The patient is here for further evaluation and discussion of radiation treatment options in the management of her disease.   She is unaccompanied today. She reports some trouble with her right knee and has been told it is related to cartilage/arthritis. States she was diagnosed with a heart murmur when she was 88  years-old. She originally met with Dr. Dalbert Walsh on 07/16/16 and does not currently have a follow up appointment scheduled. She is scheduled for consultation later today with Dr. Burr Walsh.  She previously quit smoking and began smoking again in November 2017 after a close friend passed away. Admits she smokes occasionally to reduce stress, especially with recent diagnosis. States a pack of cigarettes bought on Monday typically lasts until Thursday of the same week. During our visit today, the patient set a quit date of Friday, May 4th.   Her sister had bilateral pre invasive breast cancer, both removed surgically. Her mother had ovarian cancer and a brain tumor. She has two brothers with prostate cancer. Her sister had genetic testing done but it was negative.  PREVIOUS RADIATION THERAPY: Yes ; She received a radiation pill for her thyroid in approximately 2011.   PAST MEDICAL HISTORY:  has a past medical history of Anxiety; Arthritis; Dyslipidemia; GERD (gastroesophageal reflux disease); Heart murmur; Hypertension; Hypothyroid; and Osteopenia.    PAST SURGICAL HISTORY: Past Surgical History:  Procedure Laterality Date  . ABDOMINAL HYSTERECTOMY    . FOOT SURGERY     bi-lat/ repaired hammer toes  . TUBAL LIGATION    . VIDEO ASSISTED THORACOSCOPY (VATS)/WEDGE RESECTION Left 02/25/2015   Procedure: VIDEO ASSISTED THORACOSCOPY (VATS)/ LUL WEDGE RESECTION with ON Q placement.;  Surgeon: Taylor Nakayama, MD;  Location: [facility];  Service: Thoracic;  Laterality: Left;    FAMILY HISTORY: family history includes Breast cancer in her sister; Cancer in her brother, brother, mother, and sister; Hypertension in her father, mother, and sister; Stroke  in her father.  SOCIAL HISTORY:  reports that she quit smoking about 17 months ago. She has a 42.00 pack-year smoking history. She has never used smokeless tobacco. She reports that she does not drink alcohol or use drugs.  ALLERGIES: Vicodin  [hydrocodone-acetaminophen]  MEDICATIONS:  Current Outpatient Prescriptions  Medication Sig Dispense Refill  . aspirin 81 MG tablet Take 81 mg by mouth daily.    Marland Kitchen atorvastatin (LIPITOR) 40 MG tablet Take 40 mg by mouth daily.    . calcium-vitamin D 250-100 MG-UNIT tablet Take 2 tablets by mouth daily.     . ergocalciferol (VITAMIN D2) 50000 UNITS capsule Take 50,000 Units by mouth once a week.    . levothyroxine (SYNTHROID, LEVOTHROID) 88 MCG tablet Take 88 mcg by mouth.    . Multiple Vitamins-Minerals (MULTIVITAMIN WITH MINERALS) tablet Take 1 tablet by mouth daily.    . valACYclovir (VALTREX) 1000 MG tablet Take 1,000 mg by mouth daily as needed.    . valsartan (DIOVAN) 160 MG tablet Take 160 mg by mouth daily.    . traMADol (ULTRAM) 50 MG tablet Take 1-2 tablets (50-100 mg total) by mouth every 6 (six) hours as needed (mild pain). (Patient not taking: Reported on 07/28/2016) 30 tablet 0   No current facility-administered medications for this encounter.     REVIEW OF SYSTEMS: A 10+ POINT REVIEW OF SYSTEMS WAS OBTAINED including neurology, dermatology, psychiatry, cardiac, respiratory, lymph, extremities, GI, GU, Musculoskeletal, constitutional, breasts, HEENT.  All pertinent positives are noted in the HPI.  All others are negative.   PHYSICAL EXAM:  height is '5\' 5"'  (1.651 m) and weight is 195 lb 3.2 oz (88.5 kg). Her temperature is 98.1 F (36.7 C). Her blood pressure is 145/72 (abnormal) and her pulse is 77. Her oxygen saturation is 95%.   General: Alert and oriented, in no acute distress HEENT: Head is normocephalic. Extraocular movements are intact. Oropharynx is clear. Neck: Neck is supple, no palpable cervical or supraclavicular lymphadenopathy. Heart: Regular in rate and rhythm with no murmurs, rubs, or gallops. Chest: Clear to auscultation bilaterally, with no rhonchi, wheezes, or rales. Abdomen: Soft, nontender, nondistended, with no rigidity or guarding. Extremities: No cyanosis  or edema. Lymphatics: see Neck Exam Skin: No concerning lesions. Musculoskeletal: symmetric strength and muscle tone throughout. Neurologic: Cranial nerves II through XII are grossly intact. No obvious focalities. Speech is fluent. Coordination is intact. Psychiatric: Judgment and insight are intact. Affect is appropriate. Breasts: Firm area in the left breast around the 9 o'clock region, this area extends about 7 cm. No other palpable masses appreciated in the bilateral breasts or axillae.   ECOG = 0  0 - Asymptomatic (Fully active, able to carry on all predisease activities without restriction)  1 - Symptomatic but completely ambulatory (Restricted in physically strenuous activity but ambulatory and able to carry out work of a light or sedentary nature. For example, light housework, office work)  2 - Symptomatic, <50% in bed during the day (Ambulatory and capable of all self care but unable to carry out any work activities. Up and about more than 50% of waking hours)  3 - Symptomatic, >50% in bed, but not bedbound (Capable of only limited self-care, confined to bed or chair 50% or more of waking hours)  4 - Bedbound (Completely disabled. Cannot carry on any self-care. Totally confined to bed or chair)  5 - Death   Eustace Pen MM, Creech RH, Tormey DC, et al. [phone]). "Toxicity and response criteria of the [place]  Cooperative Oncology Group". [place] Oncol. 5 (6): 649-55   LABORATORY DATA:  Lab Results  Component Value Date   WBC 7.7 02/27/2015   HGB 11.9 (L) 02/27/2015   HCT 37.2 02/27/2015   MCV 87.1 02/27/2015   PLT 222 02/27/2015   CMP     Component Value Date/Time   NA 140 02/27/2015 0318   NA 143 01/14/2015 1403   K 3.8 02/27/2015 0318   K 3.7 01/14/2015 1403   CL 106 02/27/2015 0318   CO2 29 02/27/2015 0318   CO2 26 01/14/2015 1403   GLUCOSE 135 (H) 02/27/2015 0318   GLUCOSE 162 (H) 01/14/2015 1403   BUN 7 02/27/2015 0318   BUN 11.0 01/14/2015 1403   CREATININE  0.75 02/27/2015 0318   CREATININE 0.9 01/14/2015 1403   CALCIUM 8.4 (L) 02/27/2015 0318   CALCIUM 9.3 01/14/2015 1403   PROT 5.9 (L) 02/27/2015 0318   PROT 7.3 01/14/2015 1403   ALBUMIN 2.9 (L) 02/27/2015 0318   ALBUMIN 4.0 01/14/2015 1403   AST 23 02/27/2015 0318   AST 19 01/14/2015 1403   ALT 23 02/27/2015 0318   ALT 27 01/14/2015 1403   ALKPHOS 50 02/27/2015 0318   ALKPHOS 74 01/14/2015 1403   BILITOT 0.7 02/27/2015 0318   BILITOT 0.67 01/14/2015 1403   GFRNONAA >60 02/27/2015 0318   GFRAA >60 02/27/2015 0318         RADIOGRAPHY: Mr Breast Bilateral W Wo Contrast  Result Date: 07/27/2016 CLINICAL DATA:  Patient had 2 areas in the left breast biopsied on 07/10/2016. These were areas of suspicious microcalcifications. The microcalcifications span and approximate 8.4 cm from anterior to posterior along the lower inner left breast. The anterior posterior extends of these calcifications were biopsied, both areas revealing high-grade ductal carcinoma in situ. Current exam is to evaluate extent of disease. LABS:  Creatinine was obtained on site at [facility] at [address] Results: Creatinine 0.8 mg/dL.  GFR 92 EXAM: BILATERAL BREAST MRI WITH AND WITHOUT CONTRAST TECHNIQUE: Multiplanar, multisequence MR images of both breasts were obtained prior to and following the intravenous administration of 18 ml of MultiHance. THREE-DIMENSIONAL MR IMAGE RENDERING ON INDEPENDENT WORKSTATION: Three-dimensional MR images were rendered by post-processing of the original MR data on an independent workstation. The three-dimensional MR images were interpreted, and findings are reported in the following complete MRI report for this study. Three dimensional images were evaluated at the independent DynaCad workstation COMPARISON:  Previous exam(s). FINDINGS: Breast composition: b. Scattered fibroglandular tissue. Background parenchymal enhancement: Minimal Right breast: Normal.  No mass or abnormal  enhancement. Left breast: Abnormal enhancement extends from the anterior aspect of the lower inner left breast, near the nipple, posteriorly, spanning a combined 10.2 cm from anterior to posterior. Along the anterior aspect, associated with the biopsy clip artifact, there is a mass of high T1 and T2 signal, consistent with post biopsy hemorrhage, with surrounding rim enhancement measuring, 3.1 x 2.2 cm transversely. A similar-appearing mass, associated with the posterior biopsy clip, also with rim of enhancement, measures 2.3 x 2.1 cm transversely. Between these 2, there is a nearly contiguous string of irregular enhancing masses and clumped non masslike enhancement. This entire area corresponds to the area of suspicious calcifications, and is all likely DCIS. There is no other abnormal enhancement in the left breast. Lymph nodes: No abnormal appearing lymph nodes. Ancillary findings:  None. IMPRESSION: 1. Extensive mass and non mass enhancement along the lower inner aspect the left breast,  lower outer quadrant, spanning 10.2 cm from anterior to posterior, including both biopsied lesions, as detailed above. This is all consistent with DCIS. 2. No other abnormal enhancement in the left breast. 3. No abnormal or enlarged axillary lymph nodes. 4. No evidence of malignancy in the right breast. RECOMMENDATION: Treatment as planned for the known multifocal left breast ductal carcinoma in situ. BI-RADS CATEGORY  6: Known biopsy-proven malignancy. Electronically Signed   By: Lajean Manes M.D.   On: 07/27/2016 16:49   Mm Digital Diagnostic Unilat L  Result Date: 07/09/2016 CLINICAL DATA:  Screening recall for left breast calcifications. EXAM: DIGITAL DIAGNOSTIC LEFT MAMMOGRAM WITH CAD COMPARISON:  Previous exam(s). ACR Breast Density Category b: There are scattered areas of fibroglandular density. FINDINGS: Spot compression magnification views were performed over the lower inner left breast demonstrating calcifications  varying in shape size and density spanning a distance of approximately 8.4 cm and extending linearly towards the nipple. There is associated increased density associated with the more anterior calcifications. Mammographic images were processed with CAD. IMPRESSION: Highly suspicious left breast calcifications. RECOMMENDATION: Stereotactic guided biopsy of the the anterior and posterior extends of the calcifications (2 sites) is recommended to prove extent of disease. This is scheduled for Friday 07/10/2016 at 12:45 p.m. I have discussed the findings and recommendations with the patient. Results were also provided in writing at the conclusion of the visit. If applicable, a reminder letter will be sent to the patient regarding the next appointment. BI-RADS CATEGORY  5: Highly suggestive of malignancy. Electronically Signed   By: Everlean Alstrom M.D.   On: 07/09/2016 15:42   Mm Clip Placement Left  Result Date: 07/10/2016 CLINICAL DATA:  Two stereotactic biopsies were performed of the medial left to evaluate suspicious calcifications in the upper inner quadrant extending toward the nipple. EXAM: DIAGNOSTIC LEFT MAMMOGRAM POST TWO STEREOTACTIC BIOPSIES COMPARISON:  Previous exam(s). FINDINGS: Mammographic images were obtained following STEREOTACTIC guided biopsy of calcifications in the medial left breast, upper inner quadrant. An X shaped biopsy clip is satisfactorily positioned along the posterior extent of the biopsied calcifications. A coil shaped biopsy clip is satisfactorily positioned along the anterior extent of the biopsied calcifications. The 2 clips are separated by approximately 7.3 cm on the CC projection. IMPRESSION: Satisfactory position of coil and X shaped biopsy clips in the left breast. These 2 clips are separated by 7 cm. Final Assessment: Post Procedure Mammograms for Marker Placement Electronically Signed   By: Curlene Dolphin M.D.   On: 07/10/2016 15:26   Mm Lt Breast Bx W Loc Dev 1st Lesion  Image Bx Spec Stereo Guide  Addendum Date: 07/27/2016   ADDENDUM REPORT: 07/13/2016 11:28 ADDENDUM: Pathology revealed high grade ductal carcinoma in situ with necrosis and calcifications in the left breast, anterior and posterior. This was found to be concordant by Dr. Curlene Dolphin. Pathology results were discussed with the patient by telephone. The patient reported doing well after the biopsies with tenderness at the sites. Post biopsy instructions and care were reviewed and questions were answered. The patient was encouraged to call [facility] for any additional concerns. Surgical consultation has been arranged with Dr. Fanny Walsh at [facility] on July 15, 2016. Pathology results reported by Susa Raring RN, BSN on 07/13/2016. Electronically Signed   By: Curlene Dolphin M.D.   On: 07/13/2016 11:28   Result Date: 07/27/2016 CLINICAL DATA:  Suspicious microcalcifications in the upper inner quadrant of the left breast. Biopsy was  recommended of 2 separate areas, to evaluate anterior and posterior extensions. This report describes a biopsy of the more anterior aspect of calcifications. EXAM: LEFT BREAST STEREOTACTIC CORE NEEDLE BIOPSY COMPARISON:  Previous exams. FINDINGS: The patient and I discussed the procedure of stereotactic-guided biopsy including benefits and alternatives. We discussed the high likelihood of a successful procedure. We discussed the risks of the procedure including infection, bleeding, tissue injury, clip migration, and inadequate sampling. Informed written consent was given. The usual time out protocol was performed immediately prior to the procedure. Using sterile technique and 1% Lidocaine as local anesthetic, under stereotactic guidance, a 9 gauge vacuum assisted device was used to perform core needle biopsy of calcifications in the anterior third of the upper inner quadrant of the left breast, periareolar using a medial to  lateral approach. Specimen radiograph was performed showing multiple calcifications. Specimens with calcifications are identified for pathology. Lesion quadrant: Upper inner quadrant At the conclusion of the procedure, a coil tissue marker clip was deployed into the biopsy cavity. Follow-up 2-view mammogram was performed and dictated separately. IMPRESSION: Stereotactic-guided biopsy of the anterior extent of suspicious microcalcifications involving the upper inner quadrant of the left breast. No apparent complications. Electronically Signed: By: Curlene Dolphin M.D. On: 07/10/2016 14:52   Mm Lt Breast Bx W Loc Dev Ea Ad Lesion Img Bx Spec Stereo Guide  Addendum Date: 07/27/2016   ADDENDUM REPORT: 07/13/2016 11:28 ADDENDUM: Pathology revealed high grade ductal carcinoma in situ with necrosis and calcifications in the left breast, anterior and posterior. This was found to be concordant by Dr. Curlene Dolphin. Pathology results were discussed with the patient by telephone. The patient reported doing well after the biopsies with tenderness at the sites. Post biopsy instructions and care were reviewed and questions were answered. The patient was encouraged to call [facility] for any additional concerns. Surgical consultation has been arranged with Dr. Fanny Walsh at [facility] on July 15, 2016. Pathology results reported by Susa Raring RN, BSN on 07/13/2016. Electronically Signed   By: Curlene Dolphin M.D.   On: 07/13/2016 11:28   Result Date: 07/27/2016 CLINICAL DATA:  Stereotactic biopsy was recommended of 2 separate areas in the upper inner quadrant of the left breast given the extent of suspicious microcalcifications. This report describes the biopsy for the posterior extent of calcifications. EXAM: LEFT BREAST STEREOTACTIC CORE NEEDLE BIOPSY COMPARISON:  Previous exams. FINDINGS: The patient and I discussed the procedure of stereotactic-guided biopsy  including benefits and alternatives. We discussed the high likelihood of a successful procedure. We discussed the risks of the procedure including infection, bleeding, tissue injury, clip migration, and inadequate sampling. Informed written consent was given. The usual time out protocol was performed immediately prior to the procedure. Using sterile technique and 1% Lidocaine as local anesthetic, under stereotactic guidance, a 9 gauge vacuum assisted device was used to perform core needle biopsy of calcifications in the middle third of the upper inner quadrant of the left breast. These calcifications are the most posterior suspicious calcifications visualized. Biopsy was performed using a medial to lateral approach. Specimen radiograph was performed showing numerous calcifications. Specimens with calcifications are identified for pathology. Lesion quadrant: Upper inner quadrant At the conclusion of the procedure, a X shaped tissue marker clip was deployed into the biopsy cavity. Follow-up 2-view mammogram was performed and dictated separately. IMPRESSION: Stereotactic-guided biopsy of the left breast. No apparent complications. Electronically Signed: By: Curlene Dolphin M.D. 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 If so, plastics will be involved.  She will discuss MRI results with Dr Taylor Walsh.  It was a pleasure meeting the patient today. We discussed the risks, benefits, and side effects of radiotherapy. I recommend radiotherapy to the left breast (If she undergoes breast conservation) to reduce her risk of locoregional recurrence by 2/3.  We discussed that radiation would take approximately 5 weeks, exact number of weeks to be determined after surgery and path review;  I would give the patient a few weeks to heal following surgery before starting treatment  planning. We spoke about acute effects including skin irritation and fatigue as well as much less common late effects including internal organ injury or irritation. We spoke about the latest technology that is used to minimize the risk of late effects for patients undergoing radiotherapy to the breast or chest wall. No guarantees of treatment were given. The patient is enthusiastic about proceeding with treatment. I look forward to participating in the patient's care.  I will await her referral back to me for postoperative follow-up and eventual CT simulation/treatment planning if indicated. Consent signed today. Post mastectomy radiotherapy is much less likely to be necessary.   She is scheduled for consultation later today with Dr. Burr Walsh. I will refer the patient for genetic counseling considering her strong family history of cancer.   I asked the patient today about tobacco use. The patient uses tobacco.  I advised the patient to quit. Services were offered by me today including outpatient counseling and pharmacotherapy. I assessed for the willingness to attempt to quit and provided encouragement and demonstrated willingness to make referrals and/or prescriptions to help the patient attempt to quit. The patient has follow-up with the oncologic team to touch base on their tobacco use and /or cessation efforts.  She plans to call [facility] and possibly attend the outpatient smoking cessation class at [facility] (materials provided today).  She will not use pharmacotherapy at this time. Over 5 minutes were spent on this issue.  During our visit today, the patient set a quit date of Friday, May 4th.     __________________________________________   Eppie Gibson, MD   This document serves as a record of services personally performed by Eppie Gibson, MD. It was created on her behalf by Arlyce Harman, a trained medical scribe. The creation of this record is based on the scribe's personal observations  and the provider's statements to them. This document has been checked and approved by the attending provider.

## 2016-07-29 ENCOUNTER — Ambulatory Visit: Payer: BC Managed Care – PPO | Admitting: Hematology

## 2016-07-29 ENCOUNTER — Ambulatory Visit (HOSPITAL_BASED_OUTPATIENT_CLINIC_OR_DEPARTMENT_OTHER): Payer: BC Managed Care – PPO | Admitting: Genetic Counselor

## 2016-07-29 ENCOUNTER — Encounter: Payer: Self-pay | Admitting: Genetic Counselor

## 2016-07-29 ENCOUNTER — Other Ambulatory Visit: Payer: BC Managed Care – PPO

## 2016-07-29 DIAGNOSIS — Z170 Estrogen receptor positive status [ER+]: Secondary | ICD-10-CM

## 2016-07-29 DIAGNOSIS — D0512 Intraductal carcinoma in situ of left breast: Secondary | ICD-10-CM

## 2016-07-29 DIAGNOSIS — Z803 Family history of malignant neoplasm of breast: Secondary | ICD-10-CM | POA: Diagnosis not present

## 2016-07-29 DIAGNOSIS — Z8042 Family history of malignant neoplasm of prostate: Secondary | ICD-10-CM

## 2016-07-29 DIAGNOSIS — C50212 Malignant neoplasm of upper-inner quadrant of left female breast: Secondary | ICD-10-CM

## 2016-07-29 DIAGNOSIS — Z7183 Encounter for nonprocreative genetic counseling: Secondary | ICD-10-CM | POA: Diagnosis not present

## 2016-07-29 DIAGNOSIS — Z853 Personal history of malignant neoplasm of breast: Secondary | ICD-10-CM | POA: Diagnosis not present

## 2016-07-29 DIAGNOSIS — Z8041 Family history of malignant neoplasm of ovary: Secondary | ICD-10-CM

## 2016-07-29 NOTE — Progress Notes (Signed)
REFERRING PROVIDER: Truitt Merle, MD [address] [place], [place] [address]  PRIMARY PROVIDER:  Marton Redwood, MD  PRIMARY REASON FOR VISIT:  1. Ductal carcinoma in situ (DCIS) of left breast   2. Family history of breast cancer   3. Family history of prostate cancer   4. Carcinoma of upper-inner quadrant of left breast in female, estrogen receptor positive ([place])   5. Family history of ovarian cancer      HISTORY OF PRESENT ILLNESS:   Taylor Walsh, a 61 y.o. female, was seen for a [facility] cancer genetics consultation at the request of Dr. Burr Medico due to a personal and family history of cancer.  Taylor Walsh presents to clinic today to discuss the possibility of a hereditary predisposition to cancer, genetic testing, and to further clarify her future cancer risks, as well as potential cancer risks for family members.   In 2018, at the age of 70, Taylor Walsh was diagnosed with DCIS of the left breast. This may be treated with left mastectomy.  Taylor Walsh states that her sister has had genetic testing that is reportedly negative.    CANCER HISTORY:  Oncology History   Cancer Staging Ductal carcinoma in situ (DCIS) of left breast Staging form: Breast, AJCC 8th Edition - Clinical stage from 07/10/2016: Stage 0 (cTis (DCIS), cN0, cM0, G3, ER: Positive, PR: Positive, HER2: Not Assessed) - Signed by Truitt Merle, MD on 07/28/2016       Ductal carcinoma in situ (DCIS) of left breast   07/10/2016 Initial Biopsy    Diagnosis 1. 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 2. 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      07/10/2016 Receptors her2    ER 95%, PR 80-90%+, both strong staining, Ki67 80-90%      07/22/2016 Initial Diagnosis    Ductal carcinoma in situ (DCIS) of  left breast     07/27/2016 Imaging    Breast MRI w wo contrast showed  1. Extensive mass and non mass enhancement along the lower inner aspect the left breast, lower outer quadrant, spanning 10.2 cm from anterior to posterior, including both biopsied lesions, as detailed above. This is all consistent with DCIS. 2. No other abnormal enhancement in the left breast. 3. No abnormal or enlarged axillary lymph nodes. 4. No evidence of malignancy in the right breast        HORMONAL RISK FACTORS:  Menarche was at age 38.  First live birth at age 75.  OCP use for approximately 10+ years.  Ovaries intact: one ovary.  Hysterectomy: yes.  Menopausal status: postmenopausal.  HRT use: 0 years. Colonoscopy: yes; normal. Mammogram within the last year: yes. Number of breast biopsies: 1. Up to date with pelvic exams:  yes. Any excessive radiation exposure in the past:  no  Past Medical History:  Diagnosis Date  . Anxiety   . Arthritis   . Dyslipidemia   . Family history of breast cancer   . Family history of ovarian cancer   . Family history of prostate cancer   . GERD (gastroesophageal reflux disease)    nexium if needed  . Heart murmur   . Hypertension   . Hypothyroid   . Osteopenia     Past Surgical History:  Procedure Laterality Date  . ABDOMINAL HYSTERECTOMY    .  FOOT SURGERY     bi-lat/ repaired hammer toes  . TUBAL LIGATION    . VIDEO ASSISTED THORACOSCOPY (VATS)/WEDGE RESECTION Left 02/25/2015   Procedure: VIDEO ASSISTED THORACOSCOPY (VATS)/ LUL WEDGE RESECTION with ON Q placement.;  Surgeon: Melrose Nakayama, MD;  Location: [facility];  Service: Thoracic;  Laterality: Left;    Social History   Social History  . Marital status: Single    Spouse name: N/A  . Number of children: 2  . Years of education: N/A   Social History Main Topics  . Smoking status: Former Smoker    Packs/day: 1.00    Years: 42.00    Quit date: 02/16/2015  . Smokeless tobacco: Never Used      Comment: 5/1/18she smokes occasionally when stressed. She is smoking 4 cigarettes a week  . Alcohol use No  . Drug use: No  . Sexual activity: Not Asked   Other Topics Concern  . None   Social History Narrative  . None     FAMILY HISTORY:  We obtained a detailed, 4-generation family history.  Significant diagnoses are listed below: Family History  Problem Relation Age of Onset  . Cancer Mother     brain, ovarian  . Hypertension Mother   . Stroke Father   . Hypertension Father   . Cancer Sister 27    bilateral breast cancer, 2nd cancer at 8  . Cancer Brother     dx with prostate cancer in the 25s  . Cancer Brother     dx in his 74s with prostate cancer  . Brain cancer Maternal Uncle   . Cirrhosis Brother   . Cancer Brother     NOS  . Cancer Paternal Aunt     NOS  . Cancer Cousin     paternal cousin with cancer NOS    The patient has one son and one daughter who are cancer free.  She has one sister and five brothers.  Two brothers have prostate cancer, one died with an unknown cancer, and one died of cirrhosis of the liver.  Her sister has been diagnosed with bilateral breast cancer, the first time at 20 and second at 77.  The patient's mother had ovarian cancer in her 49's and a brain cancer in her 52's.  Her mother had five brothers and three sisters.  One brother had brain cancer.  There is no other reported family history of cancer on the maternal side.  The patient's father died of a stroke.  He had three sisters and 12 brothers.  One sister had cancer NOS.  One paternal cousin had cancer NOS.  There is no other reported family history of cancer.  Taylor Walsh is aware of previous family history of genetic testing for hereditary cancer risks in her sister, which is reportedly negative.. Patient's maternal ancestors are of [place] American, Native American and Caucasian descent, and paternal ancestors are of [place] American descent. There is no reported Ashkenazi Jewish  ancestry. There is no known consanguinity.  GENETIC COUNSELING ASSESSMENT: Taylor Walsh is a 61 y.o. female with a personal and family history of cancer which is somewhat suggestive of a hereditary cancer syndrome and predisposition to cancer. We, therefore, discussed and recommended the following at today's visit.   DISCUSSION: We discussed that about 5-10% of breast cancer is hereditary, with most cases due to BRCA mutations.  We discussed that there are other genes that can increase the risk for breast cancer, but at a moderate level,  including ATM, CHEK2 and PALB2.  We reviewed the characteristics, features and inheritance patterns of hereditary cancer syndromes. We also discussed genetic testing, including the appropriate family members to test, the process of testing, insurance coverage and turn-around-time for results. We discussed the implications of a negative, positive and/or variant of uncertain significant result. We recommended Taylor Walsh pursue genetic testing for the Common hereditary cancer gene panel.  The Common Hereditary Gene Panel offered by [facility] includes sequencing and/or deletion duplication testing of the following 46 genes: APC, ATM, AXIN2, BARD1, BMPR1A, BRCA1, BRCA2, BRIP1, CDH1, CDKN2A (p14ARF), CDKN2A (p16INK4a), CHEK2, CTNNA1, DICER1, EPCAM (Deletion/duplication testing only), GREM1 (promoter region deletion/duplication testing only), KIT, MEN1, MLH1, MSH2, MSH3, MSH6, MUTYH, NBN, NF1, NTHL1 PALB2, PDGFRA, PMS2, POLD1, POLE, PTEN, RAD50, RAD51C, RAD51D, SDHB, SDHC, SDHD, SMAD4, SMARCA4. STK11, TP53, TSC1, TSC2, and VHL.  The following gene was evaluated for sequence changes only: SDHA and HOXB13 c.251G>A variant only.   Based on Ms. Sandt's personal and family history of cancer, she meets medical criteria for genetic testing. Despite that she meets criteria, she may still have an out of pocket cost. We discussed that if her out of pocket cost for testing is over $100, the  laboratory will call and confirm whether she wants to proceed with testing.  If the out of pocket cost of testing is less than $100 she will be billed by the genetic testing laboratory.   PLAN: After considering the risks, benefits, and limitations, Taylor Walsh  provided informed consent to pursue genetic testing and the blood sample was sent to [facility] for analysis of the Common Hereditary cancer panel. Results should be available within approximately 2-3 weeks' time, at which point they will be disclosed by telephone to Taylor Walsh, as will any additional recommendations warranted by these results. Taylor Walsh will receive a summary of her genetic counseling visit and a copy of her results once available. This information will also be available in Epic. We encouraged Taylor Walsh to remain in contact with cancer genetics annually so that we can continuously update the family history and inform her of any changes in cancer genetics and testing that may be of benefit for her family. Taylor Walsh questions were answered to her satisfaction today. Our contact information was provided should additional questions or concerns arise.  Lastly, we encouraged Taylor Walsh to remain in contact with cancer genetics annually so that we can continuously update the family history and inform her of any changes in cancer genetics and testing that may be of benefit for this family.   Ms.  Walsh questions were answered to her satisfaction today. Our contact information was provided should additional questions or concerns arise. Thank you for the referral and allowing [place] to share in the care of your patient.   Naftoli Penny P. Florene Glen, [place], [facility] Certified Genetic Counselor [email or web address] phone: [phone]  The patient was seen for a total of 60 minutes in face-to-face genetic counseling.  This patient was discussed with Drs. Magrinat, Lindi Adie and/or Burr Medico who agrees with the above.     _______________________________________________________________________ For Office Staff:  Number of people involved in session: 1 Was an Intern/ student involved with case: no

## 2016-08-10 ENCOUNTER — Telehealth: Payer: Self-pay | Admitting: Genetic Counselor

## 2016-08-10 ENCOUNTER — Ambulatory Visit: Payer: Self-pay | Admitting: Genetic Counselor

## 2016-08-10 ENCOUNTER — Encounter: Payer: Self-pay | Admitting: Genetic Counselor

## 2016-08-10 DIAGNOSIS — Z8041 Family history of malignant neoplasm of ovary: Secondary | ICD-10-CM

## 2016-08-10 DIAGNOSIS — Z8042 Family history of malignant neoplasm of prostate: Secondary | ICD-10-CM

## 2016-08-10 DIAGNOSIS — Z170 Estrogen receptor positive status [ER+]: Secondary | ICD-10-CM

## 2016-08-10 DIAGNOSIS — Z803 Family history of malignant neoplasm of breast: Secondary | ICD-10-CM

## 2016-08-10 DIAGNOSIS — Z1379 Encounter for other screening for genetic and chromosomal anomalies: Secondary | ICD-10-CM | POA: Insufficient documentation

## 2016-08-10 DIAGNOSIS — C50212 Malignant neoplasm of upper-inner quadrant of left female breast: Secondary | ICD-10-CM

## 2016-08-10 NOTE — Telephone Encounter (Signed)
Revealed negative genetic testing.  Discussed that we do not know why she has breast cancer or why there is cancer in the family. It could be due to a different gene that we are not testing, or maybe our current technology may not be able to pick something up.  It will be important for her to keep in contact with genetics to keep up with whether additional testing may be needed.  Discussed that there was an ATM VUS found.  Other labs call this a likely benign variant and therefore this VUS may be leaning toward being called benign in the future. 

## 2016-08-10 NOTE — Progress Notes (Signed)
HPI: Taylor Walsh was previously seen in the [facility] clinic due to a personal and family history of cancer and concerns regarding a hereditary predisposition to cancer. Please refer to our prior cancer genetics clinic note for more information regarding Taylor Walsh's medical, social and family histories, and our assessment and recommendations, at the time. Taylor Walsh's recent genetic test results were disclosed to her, as were recommendations warranted by these results. These results and recommendations are discussed in more detail below.  CANCER HISTORY:  Oncology History   Cancer Staging Ductal carcinoma in situ (DCIS) of left breast Staging form: Breast, AJCC 8th Edition - Clinical stage from 07/10/2016: Stage 0 (cTis (DCIS), cN0, cM0, G3, ER: Positive, PR: Positive, HER2: Not Assessed) - Signed by Yan Feng, MD on 07/28/2016       Ductal carcinoma in situ (DCIS) of left breast   07/10/2016 Initial Biopsy    Diagnosis 1. 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 2. 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      07/10/2016 Receptors her2    ER 95%, PR 80-90%+, both strong staining, Ki67 80-90%      07/22/2016 Initial Diagnosis    Ductal carcinoma in situ (DCIS) of left breast     07/27/2016 Imaging    Breast MRI w wo contrast showed  1. Extensive mass and non mass enhancement along the lower inner aspect the left breast, lower outer quadrant, spanning 10.2 cm from anterior to posterior, including both biopsied lesions, as detailed above. This is all consistent with DCIS. 2. No other abnormal enhancement in the left breast. 3. No abnormal or enlarged axillary lymph nodes. 4. No evidence of malignancy in the right breast      08/10/2016 Genetic Testing   ATM c.4066A>G VUS identified on the Common Hereditary Cancer panel. The Hereditary Gene Panel offered by [facility] includes sequencing and/or deletion duplication testing of the following 46 genes: APC, ATM, AXIN2, BARD1, BMPR1A, BRCA1, BRCA2, BRIP1, CDH1, CDKN2A (p14ARF), CDKN2A (p16INK4a), CHEK2, CTNNA1, DICER1, EPCAM (Deletion/duplication testing only), GREM1 (promoter region deletion/duplication testing only), KIT, MEN1, MLH1, MSH2, MSH3, MSH6, MUTYH, NBN, NF1, NHTL1, PALB2, PDGFRA, PMS2, POLD1, POLE, PTEN, RAD50, RAD51C, RAD51D, SDHB, SDHC, SDHD, SMAD4, SMARCA4. STK11, TP53, TSC1, TSC2, and VHL.  The following gene was evaluated for sequence changes only: SDHA and HOXB13 c.251G>A variant only.  The report date is Aug 09, 2016.        FAMILY HISTORY:  We obtained a detailed, 4-generation family history.  Significant diagnoses are listed below: Family History  Problem Relation Age of Onset  . Cancer Mother        brain, ovarian  . Hypertension Mother   . Stroke Father   . Hypertension Father   . Cancer Sister 63       bilateral breast cancer, 2nd cancer at 70  . Cancer Brother        dx with prostate cancer in the 50s  . Cancer Brother        dx in his 50s with prostate cancer  . Brain cancer Maternal Uncle   . Cirrhosis Brother   . Cancer Brother        NOS  . Cancer Paternal Aunt        NOS  . Cancer Cousin          paternal cousin with cancer NOS    The patient has one son and one daughter who are cancer free.  She has one sister and five brothers.  Two brothers have prostate cancer, one died with an unknown cancer, and one died of cirrhosis of the liver.  Her sister has been diagnosed with bilateral breast cancer, the first time at 94 and second at 79.  The patient's mother had ovarian cancer in her 54's and a brain cancer in her 13's.  Her mother had five brothers and three sisters.  One brother had brain cancer.  There is no other reported family history of cancer on the maternal  side.  The patient's father died of a stroke.  He had three sisters and 12 brothers.  One sister had cancer NOS.  One paternal cousin had cancer NOS.  There is no other reported family history of cancer.  Taylor Walsh is aware of previous family history of genetic testing for hereditary cancer risks in her sister, which is reportedly negative.. Patient's maternal ancestors are of [place] American, Native American and Caucasian descent, and paternal ancestors are of [place] American descent. There is no reported Ashkenazi Jewish ancestry. There is no known consanguinity.  GENETIC TEST RESULTS: Genetic testing reported out on Aug 09, 2016 through the Common Hereditary cancer panel found no deleterious mutations.  The Hereditary Gene Panel offered by [facility] includes sequencing and/or deletion duplication testing of the following 46 genes: APC, ATM, AXIN2, BARD1, BMPR1A, BRCA1, BRCA2, BRIP1, CDH1, CDKN2A (p14ARF), CDKN2A (p16INK4a), CHEK2, CTNNA1, DICER1, EPCAM (Deletion/duplication testing only), GREM1 (promoter region deletion/duplication testing only), KIT, MEN1, MLH1, MSH2, MSH3, MSH6, MUTYH, NBN, NF1, NTHL1 PALB2, PDGFRA, PMS2, POLD1, POLE, PTEN, RAD50, RAD51C, RAD51D, SDHB, SDHC, SDHD, SMAD4, SMARCA4. STK11, TP53, TSC1, TSC2, and VHL.  The following gene was evaluated for sequence changes only: SDHA and HOXB13 c.251G>A variant only.  The test report has been scanned into EPIC and is located under the Molecular Pathology section of the Results Review tab.   We discussed with Taylor Walsh that since the current genetic testing is not perfect, it is possible there may be a gene mutation in one of these genes that current testing cannot detect, but that chance is small. We also discussed, that it is possible that another gene that has not yet been discovered, or that we have not yet tested, is responsible for the cancer diagnoses in the family, and it is, therefore, important to remain in touch with cancer  genetics in the future so that we can continue to offer Taylor Walsh the most up to date genetic testing.     CANCER SCREENING RECOMMENDATIONS: This result is reassuring and indicates that Taylor Walsh likely does not have an increased risk for a future cancer due to a mutation in one of these genes. This normal test also suggests that Taylor Walsh's cancer was most likely not due to an inherited predisposition associated with one of these genes.  Most cancers happen by chance and this negative test suggests that her cancer falls into this category.  We, therefore, recommended she continue to follow the cancer management and screening guidelines provided by her oncology and primary healthcare provider.   RECOMMENDATIONS FOR FAMILY MEMBERS: Women in this family might be at some increased risk of developing cancer, over the general population risk, simply due to the family history of cancer. We recommended women in this family have a yearly mammogram beginning at age 16, or 96 years younger than the earliest  onset of cancer, an annual clinical breast exam, and perform monthly breast self-exams. Women in this family should also have a gynecological exam as recommended by their primary provider. All family members should have a colonoscopy by age 50.  FOLLOW-UP: Lastly, we discussed with Taylor Walsh that cancer genetics is a rapidly advancing field and it is possible that new genetic tests will be appropriate for her and/or her family members in the future. We encouraged her to remain in contact with cancer genetics on an annual basis so we can update her personal and family histories and let her know of advances in cancer genetics that may benefit this family.   Our contact number was provided. Taylor Walsh's questions were answered to her satisfaction, and she knows she is welcome to call us at anytime with additional questions or concerns.   Karen Powell, MS, CGC Certified Genetic  Counselor [email or web address]  

## 2016-08-20 ENCOUNTER — Other Ambulatory Visit: Payer: Self-pay | Admitting: General Surgery

## 2016-08-20 DIAGNOSIS — Z170 Estrogen receptor positive status [ER+]: Principal | ICD-10-CM

## 2016-08-20 DIAGNOSIS — C50312 Malignant neoplasm of lower-inner quadrant of left female breast: Secondary | ICD-10-CM

## 2016-09-17 ENCOUNTER — Telehealth: Payer: Self-pay | Admitting: Hematology

## 2016-09-17 NOTE — Telephone Encounter (Signed)
lvm re 8/6 post op appt per sch msg

## 2016-09-22 ENCOUNTER — Inpatient Hospital Stay (HOSPITAL_COMMUNITY)
Admission: RE | Admit: 2016-09-22 | Discharge: 2016-09-22 | Disposition: A | Discharge status: Home / Self Care | Payer: BC Managed Care – PPO | Source: Ambulatory Visit

## 2016-09-22 ENCOUNTER — Other Ambulatory Visit: Payer: Self-pay | Admitting: General Surgery

## 2016-09-22 DIAGNOSIS — Z170 Estrogen receptor positive status [ER+]: Principal | ICD-10-CM

## 2016-09-22 DIAGNOSIS — C50912 Malignant neoplasm of unspecified site of left female breast: Secondary | ICD-10-CM

## 2016-09-22 NOTE — Pre-Procedure Instructions (Signed)
Taylor Walsh  09/22/2016    Your procedure is scheduled on Monday, July 2  Report to [facility] Admitting at 10:00AM                Your surgery or procedure is scheduled for 12:00 noon   Call this number if you have problems the morning of surgery: [phone]- pre surgery desk                For any other questions, please call [phone], Monday - Friday 8 AM - 4 PM.         Remember:  Do not eat food or drink liquids after midnight: Midnight Sunday, July 1.             Take these medicines the morning of surgery with A SIP OF WATER :  levothyroxine (SYNTHROID, LEVOTHROID), atorvastatin (LIPITOR).                 May take if needed: traMADol Veatrice Bourbon). Special instructions:   Time- Preparing For Surgery  Before surgery, you can play an important role. Because skin is not sterile, your skin needs to be as free of germs as possible. You can reduce the number of germs on your skin by washing with CHG (chlorahexidine gluconate) Soap before surgery.  CHG is an antiseptic cleaner which kills germs and bonds with the skin to continue killing germs even after washing.  Please do not use if you have an allergy to CHG or antibacterial soaps. If your skin becomes reddened/irritated stop using the CHG.  Do not shave (including legs and underarms) for at least 48 hours prior to first CHG shower. It is OK to shave your face.  Please follow these instructions carefully.   1. Shower the NIGHT BEFORE SURGERY and the MORNING OF SURGERY with CHG.   2. If you chose to wash your hair, wash your hair first as usual with your normal shampoo.  3. After you shampoo, rinse your hair and body thoroughly to remove the shampoo.   Wash face and private area with your normall soap. 4. Use CHG as you would any other liquid soap. You can apply CHG directly to the skin and wash gently with a scrungie or a clean washcloth.   5. Apply the CHG Soap to your body ONLY FROM THE NECK DOWN.  Do  not use on open wounds or open sores. Avoid contact with your eyes, ears, mouth and genitals (private parts). Wash genitals (private parts) with your normal soap.  6. Wash thoroughly, paying special attention to the area where your surgery will be performed.  7. Thoroughly rinse your body with warm water from the neck down.  8. DO NOT shower/wash with your normal soap after using and rinsing off the CHG Soap.  9. Pat yourself dry with a CLEAN TOWEL.   10. Wear CLEAN PAJAMAS   11. Place CLEAN SHEETS on your bed the night of your first shower and DO NOT SLEEP WITH PETS.  Day of Surgery: Shower as Above Do not apply any deodorants/lotions, powders or perfumes. Please wear clean clothes to the hospital/surgery center.    Do not wear jewelry, make-up or nail polish.  Do not shave 48 hours prior to surgery.  Men may shave face and neck.  Do not bring valuables to the hospital.  [facility] is not responsible for any belongings or valuables.  Contacts, dentures or bridgework may not be worn into  surgery.  Leave your suitcase in the car.  After surgery it may be brought to your room.  For patients admitted to the hospital, discharge time will be determined by your treatment team.  Patients discharged the day of surgery will not be allowed to drive home.   Please read over the following fact sheets that you were given: Cough and Deep Breathing, Pain Booklet, Surgical Site Infections.

## 2016-09-28 ENCOUNTER — Other Ambulatory Visit (HOSPITAL_COMMUNITY): Payer: BC Managed Care – PPO

## 2016-10-05 NOTE — H&P (Signed)
Taylor Walsh Location: [facility] Patient #: [id number] DOB: June 26, 1955 Divorced / Language: English / Race: Black or African American Female        History of Present Illness        This is a 61 year old female who is scheduled for  definitive surgical management of her left breast cancer. She has seen Dr. Iran Planas today. Dr. Burr Medico is involved in her care. Dr. Eppie Gibson is involved in her care. Her PCP is Carmie Kanner. Dr. Louretta Shorten is her gynecologist. Dr. Lake Bells is her pulmonologist.      I initially evaluated her on July 15, 2016. She had had imaging studies which show an extensive area of calcifications extending from the subareolar area out into the medial left breast. 2 biopsies were performed, one in the anterior third of the left breast just behind the areolar margin and one in the posterior area of the calcifications. 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 The biopsy clips are 7.3 cm apart. Subsequent MRI shows more extensive calcifications and enhancement spanning 10.2 cm from anterior to posterior including both biopsied areas consistent with DCIS. The right breast looked normal. All of the lymph nodes looked normal.      Family history reveals mother died of ovarian cancer and brain cancer. A sister, who is with her today was treated for left breast cancer at age 36 a few years ago and is recently been treated for right breast cancer by Dr. Donne Hazel. Therefore she has bilateral breast cancer. Patient's recent Genetic testing is negative      Past history significant for pulmonary amyloidosis with open lung biopsy. TAH and 1 ovary removed. Hypertension. GERD.      Social history reveals she is single. 1 son and 1 daughter. 40+-pack-year history of smoking. We are trying to get her to stop so that we can do reconstructive breast surgery. She has a  prescription for Chantix and is going to talk to Dr. Brigitte Pulse about this. She works as an Ecologist for [employer]      We had another long talk. We summarized all of the findings and all of the consultant's recommendations. She understands that she will need a left mastectomy and sentinel node biopsy. Because of the large size of her breasts, 38 DD, and her smoking history, and the fact that her disease extends all the way up to the subareolar area, that she is not a good candidate for nipple sparing mastectomy. What she and I really want to accomplish is a left skin sparing mastectomy with reconstruction, SLN biopsy, right breast reduction for symmetry. She knows that multiple procedures will be involved. She knows that she will need to stop smoking for about 4 weeks. She is going to try to do that. The other alternative is a simple left mastectomy with sentinel node without reconstruction if she continues to smoke.      She is going to start taking the Chantix today and stop smoking I encouraged her to immediately discuss this with Dr. Brigitte Pulse to help with her smoking cessation program We will schedule her surgery after July 1 She will make an appointment to see Dr. Iran Planas in about 2 weeks to make a final decision about reconstructive options I discussed the indications, techniques, and risks of the cancer surgery in detail. She understands all of these issues and agrees with this plan.      We will  coordinate the surgical plan with Dr. Iran Planas    Allergies  Vicodin ES *ANALGESICS - OPIOID*   Medication History  Atorvastatin Calcium (40MG  Tablet, Oral) Active. Levothyroxine Sodium (88MCG Tablet, Oral) Active. ValACYclovir HCl (1GM Tablet, Oral) Active. Valsartan (160MG  Tablet, Oral) Active. Vitamin D (Ergocalciferol) (50000UNIT Capsule, Oral) Active. Calcium+D3 (600-800MG -UNIT Tablet, Oral) Active. NexIUM (40MG  Capsule DR, Oral) Active. Hydromet (5-1.5MG /5ML  Syrup, Oral) Active. ProAir RespiClick (315 (90 Base)MCG/ACT Aero Pow Br Act, Inhalation) Active. Multiple Vitamins/Minerals (Oral) Active. Medications Reconciled  Vitals  Weight: 192.4 lb Height: 63in Body Surface Area: 1.9 m Body Mass Index: 34.08 kg/m  Temp.: 98.25F  BP: 130/90 (Sitting, Left Arm, Standard)    Physical Exam General Mental Status-Alert. General Appearance-Consistent with stated age. Hydration-Well hydrated. Voice-Normal.  Head and Neck Head-normocephalic, atraumatic with no lesions or palpable masses. Trachea-midline. Thyroid Gland Characteristics - normal size and consistency.  Eye Eyeball - Bilateral-Extraocular movements intact. Sclera/Conjunctiva - Bilateral-No scleral icterus.  Chest and Lung Exam Chest and lung exam reveals -quiet, even and easy respiratory effort with no use of accessory muscles and on auscultation, normal breath sounds, no adventitious sounds and normal vocal resonance. Inspection Chest Wall - Normal. Back - normal.  Breast Breast - Left-Symmetric, Non Tender, No Biopsy scars, no Dimpling, No Inflammation, No Lumpectomy scars, No Mastectomy scars, No Peau d' Orange. Breast - Right-Symmetric, Non Tender, No Biopsy scars, no Dimpling, No Inflammation, No Lumpectomy scars, No Mastectomy scars, No Peau d' Orange. Breast Lump-No Palpable Breast Mass. Note: 38DD size and very ptotic. Soft. Skin looks good. Needle biopsy sites on the left well-healed. No mass or hematoma. Nipple and areolar complex is normal. No palpable axillary adenopathy.   Cardiovascular Cardiovascular examination reveals -normal heart sounds, regular rate and rhythm with no murmurs and normal pedal pulses bilaterally.  Abdomen Inspection Inspection of the abdomen reveals - No Hernias. Skin - Scar - no surgical scars. Palpation/Percussion Palpation and Percussion of the abdomen reveal - Soft, Non Tender, No Rebound  tenderness, No Rigidity (guarding) and No hepatosplenomegaly. Auscultation Auscultation of the abdomen reveals - Bowel sounds normal.  Neurologic Neurologic evaluation reveals -alert and oriented x 3 with no impairment of recent or remote memory. Mental Status-Normal.  Musculoskeletal Normal Exam - Left-Upper Extremity Strength Normal and Lower Extremity Strength Normal. Normal Exam - Right-Upper Extremity Strength Normal and Lower Extremity Strength Normal.  Lymphatic Head & Neck  General Head & Neck Lymphatics: Bilateral - Description - Normal. Axillary  General Axillary Region: Bilateral - Description - Normal. Tenderness - Non Tender. Femoral & Inguinal  Generalized Femoral & Inguinal Lymphatics: Bilateral - Description - Normal. Tenderness - Non Tender.    Assessment & Plan  PRIMARY CANCER OF UPPER INNER QUADRANT OF LEFT FEMALE BREAST (V76.160)    We have reviewed all of the facts regarding your left breast cancer today with you and your family members. The MRI shows extensive changes in the medial left breast extending from just behind the nipple out 10 or 11 cm. Biopsies of the most anterior and the most posterior extent of these changes both showed high-grade ductal carcinoma in situ, estrogen receptor positive. Genetic testing is negative.  You have seen Dr. Burr Medico who also recommends mastectomy and antiestrogen therapy You have seen Dr. Isidore Moos to discuss radiation therapy issues. Hopefully, he will not need radiation therapy. You have seen Dr. Iran Planas just today, and she has advised you to stop smoking if you wish to have reconstruction. I completely agree  We have discussed numerous  options and strategies. We have decided that you will stop smoking today Dr. Becky Sax will supervise your smoking cessation program, and you already have a prescription for Chantix  You'll be scheduled for left total mastectomy and left axillary sentinel node  biopsy by me. Dr. Iran Planas will discuss reconstructive plans when you see her in a couple of weeks please be sure to make an appointment with her We will coordinate the surgery with Dr. Iran Planas, assuming she is going to do an immediate reconstruction. At some point, he will need a reduction mammoplasty on the right to achieve symmetry. You must stop smoking for 4 or 5 weeks, and so we will schedule the surgery after July 1. I have discussed the indications, techniques, and risks of this surgery in detail.  FAMILY HISTORY OF BREAST CANCER IN SISTER (Z80.3) Impression: Sister had bilateral breast cancer. TOBACCO ABUSE (Z72.0) HISTORY OF TOTAL ABDOMINAL HYSTERECTOMY (Z90.710) Impression: 1 ovary removed. One ovary remains. HYPERTENSION, BENIGN (I10) BMI 34.0-34.9,ADULT (Z68.34) FAMILY HISTORY OF PROSTATE CANCER (Z80.42) Impression: Both brothers had prostate cancer, survived. 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 Dalbert Batman, M.D., [facility] General and Minimally invasive Surgery Breast and Colorectal Surgery Office:   [phone] Pager:   [phone]

## 2016-10-05 NOTE — H&P (Signed)
Subjective:     Patient ID: Taylor Walsh is a 61 y.o. female.  HPI  Here for follow up discussion breast reconstruction prior to planned mastectomy. Presented following screening MMG with new left breast calcifications. Biopsy of the left UIQ anterior and posteriorshowed high grade DCIS, both ER/PR+.Quit smoking 2 weeks ago.  MRI showed mass and NME spanning 10.2 cm from anterior to posterior, including both biopsied lesions. No abnormal or enlarged LN, and no evidence of malignancy in the right breast.  Given extent of disease mastectomy has been recommended. Plan hormonal treatment following surgery.  Genetic testing with [facility].  Returned to smoking in 2015-05-18 after friend died. History of pulmonary amyloidosis and post one benign lung biopsy. Patient lives with daughter and two young grandchildren. 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  Current 38 DD, desires smaller. Wt stable  Review of Systems     Objective:   Physical Exam  Cardiovascular: Normal rate, regular rhythm and normal heart sounds.   Pulmonary/Chest: Effort normal and breath sounds normal.  Abdominal:  Panniculus present with striae, no hernias  Lymphadenopathy:    She has no axillary adenopathy.   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 We reviewed TRAM vs DIEP vs implant based reconstruction. Counseled that if abdominal based reconstruction is her desire, she does not require expander placement. If she is still unable to decide on final reconstruction, having expander in place does not preclude her from eventual abdominal based reconstruction. I did discuss that we will not know final plan until final pathology; there is large area of enhancement  and may find areas of invasive disease within this. If this occurs, would need to reassess both cancer and reconstructive plan.   At this time patient has elected to proceed with immediate placement dual plane tissue expander and acellular dermis reconstruction following skin reduction pattern mastectomy.   Reviewed anchor type incisions, drains, OR length, hospital stay and recovery. Discussed process of expansion and implant based risks including rupture, MRI surveillance for silicone implants, infection requiring surgery or removal, contracture. Discussed asymmetry one can expect with implant unilateral and natural breast on opposite. Discussed future surgery for symmetry procedure but even with berast lift/reduction, natural breast will have less upper pole fullness, less ptosis than implant reconstruction.'  Discussed use of acellular dermis in breast reconstruction, cadaveric source. Reviewed if problems of seroma or infection in early period prior to incorporation can contribute to infection and may require removal.  Reviewed reconstruction will be asensate. Discussed risks mastectomy flap necrosis requiring additional surgery or removal device. Additional risks including but not limited to bleeding, hematoma, seroma, DVT/PE, fat necrosis that presents as lumps, damage to deeper structures, unacceptable cosmetic result.    Irene Limbo, MD [facility] Plastic & Reconstructive Surgery [phone], pin [phone]

## 2016-10-07 NOTE — Pre-Procedure Instructions (Signed)
Taylor Walsh  10/07/2016      [facility] ([facility]), [place] [address] [place] ([place]) [place] [address] Phone: [phone] Fax: [phone]    Your procedure is scheduled on July 17  Report to [place] at 1000 A.M.  Call this number if you have problems the morning of surgery:  [phone]   Remember:  Do not eat food or drink liquids after midnight except follow these instructions  Please complete your 8oz of Boost Breeze that was given to you at your preadmission appointment by 0800 on the day of your surgery    Take these medicines the morning of surgery with A SIP OF WATER levothyroxine (SYNTHROID, LEVOTHROID),  valACYclovir (VALTREX) if needed  7 days prior to surgery STOP taking any Aleve, Naproxen, Ibuprofen, Motrin, Advil, Goody's, BC's, all herbal medications, fish oil, and all vitamins  Follow your doctors instructions regarding your Aspirin.  If no instructions were given by the doctor you will need to call the office to get instructions.  Your pre admission RN will also call for those instructions    Do not wear jewelry, make-up or nail polish.  Do not wear lotions, powders, or perfumes, or deoderant.  Do not shave 48 hours prior to surgery.  Men may shave face and neck.  Do not bring valuables to the hospital.  [facility] is not responsible for any belongings or valuables.  Contacts, dentures or bridgework may not be worn into surgery.  Leave your suitcase in the car.  After surgery it may be brought to your room.  For patients admitted to the hospital, discharge time will be determined by your treatment team.  Patients discharged the day of surgery will not be allowed to drive home.    Special instructions:   [facility]- Preparing For Surgery  Before surgery, you can play an important role. Because skin is not sterile, your skin needs to be as free of germs as possible. You can reduce  the number of germs on your skin by washing with CHG (chlorahexidine gluconate) Soap before surgery.  CHG is an antiseptic cleaner which kills germs and bonds with the skin to continue killing germs even after washing.  Please do not use if you have an allergy to CHG or antibacterial soaps. If your skin becomes reddened/irritated stop using the CHG.  Do not shave (including legs and underarms) for at least 48 hours prior to first CHG shower. It is OK to shave your face.  Please follow these instructions carefully.   1. Shower the NIGHT BEFORE SURGERY and the MORNING OF SURGERY with CHG.   2. If you chose to wash your hair, wash your hair first as usual with your normal shampoo.  3. After you shampoo, rinse your hair and body thoroughly to remove the shampoo.  4. Use CHG as you would any other liquid soap. You can apply CHG directly to the skin and wash gently with a scrungie or a clean washcloth.   5. Apply the CHG Soap to your body ONLY FROM THE NECK DOWN.  Do not use on open wounds or open sores. Avoid contact with your eyes, ears, mouth and genitals (private parts). Wash genitals (private parts) with your normal soap.  6. Wash thoroughly, paying special attention to the area where your surgery will be performed.  7. Thoroughly rinse your body with warm water from the neck down.  8. DO  NOT shower/wash with your normal soap after using and rinsing off the CHG Soap.  9. Pat yourself dry with a CLEAN TOWEL.   10. Wear CLEAN PAJAMAS   11. Place CLEAN SHEETS on your bed the night of your first shower and DO NOT SLEEP WITH PETS.    Day of Surgery: Do not apply any deodorants/lotions. Please wear clean clothes to the hospital/surgery center.      Please read over the following fact sheets that you were given.

## 2016-10-08 ENCOUNTER — Encounter (HOSPITAL_COMMUNITY): Payer: Self-pay

## 2016-10-08 ENCOUNTER — Encounter (HOSPITAL_COMMUNITY)
Admission: RE | Admit: 2016-10-08 | Discharge: 2016-10-08 | Disposition: A | Discharge status: Home / Self Care | Payer: BC Managed Care – PPO | Source: Ambulatory Visit | Attending: General Surgery | Admitting: General Surgery

## 2016-10-08 DIAGNOSIS — Z01812 Encounter for preprocedural laboratory examination: Secondary | ICD-10-CM | POA: Insufficient documentation

## 2016-10-08 DIAGNOSIS — Z01810 Encounter for preprocedural cardiovascular examination: Secondary | ICD-10-CM | POA: Insufficient documentation

## 2016-10-08 DIAGNOSIS — J189 Pneumonia, unspecified organism: Secondary | ICD-10-CM

## 2016-10-08 DIAGNOSIS — C50912 Malignant neoplasm of unspecified site of left female breast: Secondary | ICD-10-CM | POA: Diagnosis not present

## 2016-10-08 DIAGNOSIS — C801 Malignant (primary) neoplasm, unspecified: Secondary | ICD-10-CM

## 2016-10-08 HISTORY — DX: Pneumonia, unspecified organism: J18.9

## 2016-10-08 HISTORY — DX: Malignant (primary) neoplasm, unspecified: C80.1

## 2016-10-08 LAB — COMPREHENSIVE METABOLIC PANEL
ALBUMIN: 4 g/dL (ref 3.5–5.0)
ALT: 31 U/L (ref 14–54)
ANION GAP: 11 (ref 5–15)
AST: 26 U/L (ref 15–41)
Alkaline Phosphatase: 69 U/L (ref 38–126)
BILIRUBIN TOTAL: 1.1 mg/dL (ref 0.3–1.2)
BUN: 6 mg/dL (ref 6–20)
CO2: 22 mmol/L (ref 22–32)
Calcium: 9.3 mg/dL (ref 8.9–10.3)
Chloride: 105 mmol/L (ref 101–111)
Creatinine, Ser: 0.84 mg/dL (ref 0.44–1.00)
GFR calc Af Amer: >60 mL/min (ref >60)
GFR calc non Af Amer: >60 mL/min (ref >60)
GLUCOSE: 99 mg/dL (ref 65–99)
POTASSIUM: 3.8 mmol/L (ref 3.5–5.1)
SODIUM: 138 mmol/L (ref 135–145)
TOTAL PROTEIN: 7 g/dL (ref 6.5–8.1)

## 2016-10-08 LAB — CBC WITH DIFFERENTIAL/PLATELET
BASOS PCT: 0 %
Basophils Absolute: 0 10*3/uL (ref 0.0–0.1)
Eosinophils Absolute: 0.1 10*3/uL (ref 0.0–0.7)
Eosinophils Relative: 2 %
HCT: 40.1 % (ref 36.0–46.0)
Hemoglobin: 13.6 g/dL (ref 12.0–15.0)
Lymphocytes Relative: 41 %
Lymphs Abs: 2.2 10*3/uL (ref 0.7–4.0)
MCH: 28.9 pg (ref 26.0–34.0)
MCHC: 33.9 g/dL (ref 30.0–36.0)
MCV: 85.1 fL (ref 78.0–100.0)
MONOS PCT: 10 %
Monocytes Absolute: 0.5 10*3/uL (ref 0.1–1.0)
Neutro Abs: 2.5 10*3/uL (ref 1.7–7.7)
Neutrophils Relative %: 47 %
Platelets: 288 10*3/uL (ref 150–400)
RBC: 4.71 MIL/uL (ref 3.87–5.11)
RDW: 14.8 % (ref 11.5–15.5)
WBC: 5.3 10*3/uL (ref 4.0–10.5)

## 2016-10-08 MED ORDER — CHLORHEXIDINE GLUCONATE CLOTH 2 % EX PADS: 6.0000 | MEDICATED_PAD | Freq: Once | CUTANEOUS | Status: DC

## 2016-10-08 NOTE — Progress Notes (Addendum)
CHE:[facility], Taylor Saxon, MD  Cardiologist:  denies  EKG: denies past year  Stress test: pt denies  ECHO: 03/28/2015 in EPIC  Cardiac Cath: pt denies ever  Chest x-ray: denies past year

## 2016-10-12 MED ORDER — CEFAZOLIN SODIUM-DEXTROSE 2-4 GM/100ML-% IV SOLN
2.0000 g | INTRAVENOUS | Status: AC
  Administered 2016-10-13: 2 g via INTRAVENOUS
  Filled 2016-10-12: qty 100

## 2016-10-12 MED ORDER — GABAPENTIN 300 MG PO CAPS
300.0000 mg | ORAL_CAPSULE | ORAL | Status: AC
  Administered 2016-10-13: 300 mg via ORAL
  Filled 2016-10-12: qty 1

## 2016-10-12 MED ORDER — CELECOXIB 200 MG PO CAPS
400.0000 mg | ORAL_CAPSULE | ORAL | Status: AC
  Administered 2016-10-13: 400 mg via ORAL
  Filled 2016-10-12: qty 2

## 2016-10-12 MED ORDER — ACETAMINOPHEN 500 MG PO TABS
1000.0000 mg | ORAL_TABLET | ORAL | Status: AC
  Administered 2016-10-13: 1000 mg via ORAL
  Filled 2016-10-12: qty 2

## 2016-10-13 ENCOUNTER — Ambulatory Visit (HOSPITAL_COMMUNITY): Payer: BC Managed Care – PPO | Admitting: Certified Registered Nurse Anesthetist

## 2016-10-13 ENCOUNTER — Encounter (HOSPITAL_COMMUNITY): Payer: Self-pay | Admitting: *Deleted

## 2016-10-13 ENCOUNTER — Ambulatory Visit (HOSPITAL_COMMUNITY)
Admission: RE | Admit: 2016-10-13 | Discharge: 2016-10-13 | Disposition: A | Discharge status: Home / Self Care | Payer: BC Managed Care – PPO | Source: Ambulatory Visit | Attending: General Surgery | Admitting: General Surgery

## 2016-10-13 ENCOUNTER — Encounter (HOSPITAL_COMMUNITY)
Admission: RE | Disposition: A | Discharge status: Home / Self Care | Payer: Self-pay | Source: Ambulatory Visit | Attending: Surgery

## 2016-10-13 ENCOUNTER — Observation Stay (HOSPITAL_COMMUNITY)
Admission: RE | Admit: 2016-10-13 | Discharge: 2016-10-14 | Disposition: A | Discharge status: Home / Self Care | Payer: BC Managed Care – PPO | Source: Ambulatory Visit | Attending: Surgery | Admitting: Surgery

## 2016-10-13 DIAGNOSIS — I4891 Unspecified atrial fibrillation: Secondary | ICD-10-CM | POA: Insufficient documentation

## 2016-10-13 DIAGNOSIS — Z7952 Long term (current) use of systemic steroids: Secondary | ICD-10-CM | POA: Diagnosis not present

## 2016-10-13 DIAGNOSIS — E7800 Pure hypercholesterolemia, unspecified: Secondary | ICD-10-CM | POA: Diagnosis not present

## 2016-10-13 DIAGNOSIS — Z791 Long term (current) use of non-steroidal anti-inflammatories (NSAID): Secondary | ICD-10-CM | POA: Insufficient documentation

## 2016-10-13 DIAGNOSIS — Z7901 Long term (current) use of anticoagulants: Secondary | ICD-10-CM | POA: Diagnosis not present

## 2016-10-13 DIAGNOSIS — Z87891 Personal history of nicotine dependence: Secondary | ICD-10-CM | POA: Insufficient documentation

## 2016-10-13 DIAGNOSIS — J329 Chronic sinusitis, unspecified: Secondary | ICD-10-CM | POA: Diagnosis present

## 2016-10-13 DIAGNOSIS — E669 Obesity, unspecified: Secondary | ICD-10-CM | POA: Insufficient documentation

## 2016-10-13 DIAGNOSIS — K219 Gastro-esophageal reflux disease without esophagitis: Secondary | ICD-10-CM | POA: Insufficient documentation

## 2016-10-13 DIAGNOSIS — J322 Chronic ethmoidal sinusitis: Secondary | ICD-10-CM | POA: Insufficient documentation

## 2016-10-13 DIAGNOSIS — J321 Chronic frontal sinusitis: Secondary | ICD-10-CM | POA: Diagnosis not present

## 2016-10-13 DIAGNOSIS — J342 Deviated nasal septum: Secondary | ICD-10-CM | POA: Insufficient documentation

## 2016-10-13 DIAGNOSIS — J320 Chronic maxillary sinusitis: Principal | ICD-10-CM | POA: Insufficient documentation

## 2016-10-13 DIAGNOSIS — Z79899 Other long term (current) drug therapy: Secondary | ICD-10-CM | POA: Insufficient documentation

## 2016-10-13 DIAGNOSIS — Z170 Estrogen receptor positive status [ER+]: Principal | ICD-10-CM

## 2016-10-13 DIAGNOSIS — I10 Essential (primary) hypertension: Secondary | ICD-10-CM | POA: Insufficient documentation

## 2016-10-13 DIAGNOSIS — D0510 Intraductal carcinoma in situ of unspecified breast: Secondary | ICD-10-CM | POA: Diagnosis present

## 2016-10-13 DIAGNOSIS — K589 Irritable bowel syndrome without diarrhea: Secondary | ICD-10-CM | POA: Insufficient documentation

## 2016-10-13 DIAGNOSIS — M1990 Unspecified osteoarthritis, unspecified site: Secondary | ICD-10-CM | POA: Diagnosis not present

## 2016-10-13 DIAGNOSIS — Z6841 Body Mass Index (BMI) 40.0 and over, adult: Secondary | ICD-10-CM | POA: Insufficient documentation

## 2016-10-13 DIAGNOSIS — Z859 Personal history of malignant neoplasm, unspecified: Secondary | ICD-10-CM | POA: Insufficient documentation

## 2016-10-13 DIAGNOSIS — C50312 Malignant neoplasm of lower-inner quadrant of left female breast: Secondary | ICD-10-CM

## 2016-10-13 DIAGNOSIS — I4510 Unspecified right bundle-branch block: Secondary | ICD-10-CM | POA: Insufficient documentation

## 2016-10-13 HISTORY — PX: MASTECTOMY: SHX3

## 2016-10-13 HISTORY — PX: BREAST RECONSTRUCTION WITH PLACEMENT OF TISSUE EXPANDER AND FLEX HD (ACELLULAR HYDRATED DERMIS): SHX6295

## 2016-10-13 HISTORY — PX: MASTECTOMY W/ SENTINEL NODE BIOPSY: SHX2001

## 2016-10-13 SURGERY — MASTECTOMY WITH SENTINEL LYMPH NODE BIOPSY
Anesthesia: General | Site: Breast | Laterality: Left

## 2016-10-13 MED ORDER — KETOROLAC TROMETHAMINE 15 MG/ML IJ SOLN
INTRAMUSCULAR | Status: AC
  Filled 2016-10-13: qty 1

## 2016-10-13 MED ORDER — DEXAMETHASONE SODIUM PHOSPHATE 10 MG/ML IJ SOLN
INTRAMUSCULAR | Status: AC
  Filled 2016-10-13: qty 1

## 2016-10-13 MED ORDER — TECHNETIUM TC 99M SULFUR COLLOID FILTERED
1.0000 | Freq: Once | INTRAVENOUS | Status: AC | PRN
  Administered 2016-10-13: 1 via INTRADERMAL

## 2016-10-13 MED ORDER — FENTANYL CITRATE (PF) 250 MCG/5ML IJ SOLN
INTRAMUSCULAR | Status: AC
  Filled 2016-10-13: qty 5

## 2016-10-13 MED ORDER — METHOCARBAMOL 500 MG PO TABS
500.0000 mg | ORAL_TABLET | Freq: Three times a day (TID) | ORAL | Status: DC | PRN
  Administered 2016-10-13: 500 mg via ORAL

## 2016-10-13 MED ORDER — ROCURONIUM BROMIDE 50 MG/5ML IV SOLN
INTRAVENOUS | Status: AC
  Filled 2016-10-13: qty 1

## 2016-10-13 MED ORDER — ROCURONIUM BROMIDE 50 MG/5ML IV SOLN
INTRAVENOUS | Status: AC
  Filled 2016-10-13: qty 2

## 2016-10-13 MED ORDER — DIPHENHYDRAMINE HCL 12.5 MG/5ML PO ELIX: 12.5000 mg | ORAL_SOLUTION | Freq: Four times a day (QID) | ORAL | Status: DC | PRN

## 2016-10-13 MED ORDER — SODIUM CHLORIDE 0.9 % IV SOLN
Freq: Once | INTRAVENOUS | Status: AC
  Administered 2016-10-13: 14:00:00
  Filled 2016-10-13: qty 1

## 2016-10-13 MED ORDER — KETOROLAC TROMETHAMINE 15 MG/ML IJ SOLN
15.0000 mg | Freq: Three times a day (TID) | INTRAMUSCULAR | Status: AC
  Administered 2016-10-13 – 2016-10-14 (×3): 15 mg via INTRAVENOUS
  Filled 2016-10-13 (×2): qty 1

## 2016-10-13 MED ORDER — BUPIVACAINE-EPINEPHRINE (PF) 0.5% -1:200000 IJ SOLN
INTRAMUSCULAR | Status: DC | PRN
  Administered 2016-10-13: 25 mL via PERINEURAL

## 2016-10-13 MED ORDER — OXYCODONE HCL 5 MG PO TABS
ORAL_TABLET | ORAL | Status: AC
  Filled 2016-10-13: qty 2

## 2016-10-13 MED ORDER — TRAMADOL HCL 50 MG PO TABS: 50.0000 mg | ORAL_TABLET | Freq: Four times a day (QID) | ORAL | Status: DC | PRN

## 2016-10-13 MED ORDER — SUCCINYLCHOLINE CHLORIDE 200 MG/10ML IV SOSY
PREFILLED_SYRINGE | INTRAVENOUS | Status: AC
  Filled 2016-10-13: qty 20

## 2016-10-13 MED ORDER — FENTANYL CITRATE (PF) 100 MCG/2ML IJ SOLN
50.0000 ug | Freq: Once | INTRAMUSCULAR | Status: AC
  Administered 2016-10-13: 50 ug via INTRAVENOUS

## 2016-10-13 MED ORDER — ONDANSETRON HCL 4 MG/2ML IJ SOLN
INTRAMUSCULAR | Status: DC | PRN
  Administered 2016-10-13: 4 mg via INTRAVENOUS

## 2016-10-13 MED ORDER — ONDANSETRON HCL 4 MG/2ML IJ SOLN: 4.0000 mg | Freq: Four times a day (QID) | INTRAMUSCULAR | Status: DC | PRN

## 2016-10-13 MED ORDER — HYDROMORPHONE HCL 1 MG/ML IJ SOLN: 0.5000 mg | INTRAMUSCULAR | Status: DC | PRN

## 2016-10-13 MED ORDER — MIDAZOLAM HCL 2 MG/2ML IJ SOLN
INTRAMUSCULAR | Status: AC
  Filled 2016-10-13: qty 2

## 2016-10-13 MED ORDER — MIDAZOLAM HCL 2 MG/2ML IJ SOLN
INTRAMUSCULAR | Status: AC
  Administered 2016-10-13: 2 mg via INTRAVENOUS
  Filled 2016-10-13: qty 2

## 2016-10-13 MED ORDER — FENTANYL CITRATE (PF) 100 MCG/2ML IJ SOLN
INTRAMUSCULAR | Status: AC
  Administered 2016-10-13: 50 ug via INTRAVENOUS
  Filled 2016-10-13: qty 2

## 2016-10-13 MED ORDER — FENTANYL CITRATE (PF) 100 MCG/2ML IJ SOLN
INTRAMUSCULAR | Status: DC | PRN
  Administered 2016-10-13 (×4): 50 ug via INTRAVENOUS
  Administered 2016-10-13: 25 ug via INTRAVENOUS

## 2016-10-13 MED ORDER — KCL IN DEXTROSE-NACL 20-5-0.45 MEQ/L-%-% IV SOLN
INTRAVENOUS | Status: DC
  Administered 2016-10-13 – 2016-10-14 (×2): via INTRAVENOUS
  Filled 2016-10-13 (×3): qty 1000

## 2016-10-13 MED ORDER — METHOCARBAMOL 500 MG PO TABS
ORAL_TABLET | ORAL | Status: AC
  Filled 2016-10-13: qty 1

## 2016-10-13 MED ORDER — METHYLENE BLUE 0.5 % INJ SOLN
INTRAVENOUS | Status: AC
  Filled 2016-10-13: qty 10

## 2016-10-13 MED ORDER — SODIUM CHLORIDE 0.9 % IJ SOLN
INTRAMUSCULAR | Status: AC
  Filled 2016-10-13: qty 10

## 2016-10-13 MED ORDER — ONDANSETRON 4 MG PO TBDP: 4.0000 mg | ORAL_TABLET | Freq: Four times a day (QID) | ORAL | Status: DC | PRN

## 2016-10-13 MED ORDER — LEVOTHYROXINE SODIUM 88 MCG PO TABS
88.0000 ug | ORAL_TABLET | Freq: Every day | ORAL | Status: DC
  Administered 2016-10-14: 88 ug via ORAL
  Filled 2016-10-13: qty 1

## 2016-10-13 MED ORDER — SUGAMMADEX SODIUM 200 MG/2ML IV SOLN
INTRAVENOUS | Status: DC | PRN
  Administered 2016-10-13: 200 mg via INTRAVENOUS

## 2016-10-13 MED ORDER — ENOXAPARIN SODIUM 40 MG/0.4ML ~~LOC~~ SOLN: 40.0000 mg | SUBCUTANEOUS | Status: DC

## 2016-10-13 MED ORDER — DIPHENHYDRAMINE HCL 50 MG/ML IJ SOLN: 12.5000 mg | Freq: Four times a day (QID) | INTRAMUSCULAR | Status: DC | PRN

## 2016-10-13 MED ORDER — ROCURONIUM BROMIDE 100 MG/10ML IV SOLN
INTRAVENOUS | Status: DC | PRN
  Administered 2016-10-13 (×2): 40 mg via INTRAVENOUS

## 2016-10-13 MED ORDER — CEFAZOLIN SODIUM-DEXTROSE 1-4 GM/50ML-% IV SOLN
1.0000 g | Freq: Three times a day (TID) | INTRAVENOUS | Status: DC
  Administered 2016-10-13 – 2016-10-14 (×2): 1 g via INTRAVENOUS
  Filled 2016-10-13 (×4): qty 50

## 2016-10-13 MED ORDER — PROPOFOL 10 MG/ML IV BOLUS
INTRAVENOUS | Status: AC
  Filled 2016-10-13: qty 20

## 2016-10-13 MED ORDER — DEXAMETHASONE SODIUM PHOSPHATE 10 MG/ML IJ SOLN
INTRAMUSCULAR | Status: DC | PRN
  Administered 2016-10-13: 10 mg via INTRAVENOUS

## 2016-10-13 MED ORDER — 0.9 % SODIUM CHLORIDE (POUR BTL) OPTIME
TOPICAL | Status: DC | PRN
  Administered 2016-10-13 (×2): 1000 mL

## 2016-10-13 MED ORDER — OXYCODONE HCL 5 MG PO TABS
5.0000 mg | ORAL_TABLET | ORAL | Status: DC | PRN
  Administered 2016-10-13: 10 mg via ORAL

## 2016-10-13 MED ORDER — MIDAZOLAM HCL 2 MG/2ML IJ SOLN
2.0000 mg | Freq: Once | INTRAMUSCULAR | Status: AC
  Administered 2016-10-13: 2 mg via INTRAVENOUS

## 2016-10-13 MED ORDER — IRBESARTAN 150 MG PO TABS
150.0000 mg | ORAL_TABLET | Freq: Every day | ORAL | Status: DC
  Administered 2016-10-13: 150 mg via ORAL
  Filled 2016-10-13: qty 1

## 2016-10-13 MED ORDER — PROPOFOL 10 MG/ML IV BOLUS
INTRAVENOUS | Status: DC | PRN
  Administered 2016-10-13: 160 mg via INTRAVENOUS

## 2016-10-13 MED ORDER — LACTATED RINGERS IV SOLN
INTRAVENOUS | Status: DC
  Administered 2016-10-13 (×2): via INTRAVENOUS

## 2016-10-13 MED ORDER — LIDOCAINE HCL (CARDIAC) 20 MG/ML IV SOLN
INTRAVENOUS | Status: DC | PRN
  Administered 2016-10-13: 100 mg via INTRAVENOUS

## 2016-10-13 SURGICAL SUPPLY — 93 items
ADH SKN CLS APL DERMABOND .7 (GAUZE/BANDAGES/DRESSINGS) ×1
ALLODERM 8X16 MED THICK (Tissue) ×3 IMPLANT
APPLIER CLIP 9.375 MED OPEN (MISCELLANEOUS) ×3
APR CLP MED 9.3 20 MLT OPN (MISCELLANEOUS) ×1
BAG DECANTER FOR FLEXI CONT (MISCELLANEOUS) ×3 IMPLANT
BINDER BREAST LRG (GAUZE/BANDAGES/DRESSINGS) IMPLANT
BINDER BREAST XLRG (GAUZE/BANDAGES/DRESSINGS) ×2 IMPLANT
CANISTER SUCT 3000ML PPV (MISCELLANEOUS) ×6 IMPLANT
CHLORAPREP W/TINT 26ML (MISCELLANEOUS) ×4 IMPLANT
CLIP APPLIE 9.375 MED OPEN (MISCELLANEOUS) ×1 IMPLANT
CLOSURE STERI-STRIP 1/2X4 (GAUZE/BANDAGES/DRESSINGS)
CLSR STERI-STRIP ANTIMIC 1/2X4 (GAUZE/BANDAGES/DRESSINGS) ×1 IMPLANT
CONT SPEC 4OZ CLIKSEAL STRL BL (MISCELLANEOUS) ×3 IMPLANT
COVER PROBE W GEL 5X96 (DRAPES) ×3 IMPLANT
COVER SURGICAL LIGHT HANDLE (MISCELLANEOUS) ×4 IMPLANT
DERMABOND ADVANCED (GAUZE/BANDAGES/DRESSINGS) ×2
DERMABOND ADVANCED .7 DNX12 (GAUZE/BANDAGES/DRESSINGS) ×3 IMPLANT
DRAIN CHANNEL 15F RND FF W/TCR (WOUND CARE) ×2 IMPLANT
DRAIN CHANNEL 19F RND (DRAIN) ×3 IMPLANT
DRAPE HALF SHEET 40X57 (DRAPES) IMPLANT
DRAPE INCISE IOBAN 66X45 STRL (DRAPES) IMPLANT
DRAPE LAPAROSCOPIC ABDOMINAL (DRAPES) ×1 IMPLANT
DRAPE ORTHO SPLIT 77X108 STRL (DRAPES) ×6
DRAPE SURG ORHT 6 SPLT 77X108 (DRAPES) ×2 IMPLANT
DRAPE UTILITY XL STRL (DRAPES) ×2 IMPLANT
DRAPE WARM FLUID 44X44 (DRAPE) ×1 IMPLANT
DRSG PAD ABDOMINAL 8X10 ST (GAUZE/BANDAGES/DRESSINGS) ×6 IMPLANT
DRSG TEGADERM 2-3/8X2-3/4 SM (GAUZE/BANDAGES/DRESSINGS) ×3 IMPLANT
DRSG TEGADERM 4X4.75 (GAUZE/BANDAGES/DRESSINGS) ×10 IMPLANT
ELECT BLADE 4.0 EZ CLEAN MEGAD (MISCELLANEOUS) ×3
ELECT CAUTERY BLADE 6.4 (BLADE) ×3 IMPLANT
ELECT COATED BLADE 2.86 ST (ELECTRODE) ×3 IMPLANT
ELECT REM PT RETURN 9FT ADLT (ELECTROSURGICAL) ×6
ELECTRODE BLDE 4.0 EZ CLN MEGD (MISCELLANEOUS) ×1 IMPLANT
ELECTRODE REM PT RTRN 9FT ADLT (ELECTROSURGICAL) ×2 IMPLANT
EVACUATOR SILICONE 100CC (DRAIN) ×5 IMPLANT
EXPANDER TISSUE MX 500CC (Prosthesis & Implant Plastic) IMPLANT
GAUZE SPONGE 4X4 12PLY STRL (GAUZE/BANDAGES/DRESSINGS) ×6 IMPLANT
GLOVE BIO SURGEON STRL SZ 6 (GLOVE) ×7 IMPLANT
GLOVE SURG SIGNA 7.5 PF LTX (GLOVE) ×3 IMPLANT
GOWN STRL REUS W/ TWL LRG LVL3 (GOWN DISPOSABLE) ×4 IMPLANT
GOWN STRL REUS W/ TWL XL LVL3 (GOWN DISPOSABLE) ×1 IMPLANT
GOWN STRL REUS W/TWL LRG LVL3 (GOWN DISPOSABLE) ×6
GOWN STRL REUS W/TWL XL LVL3 (GOWN DISPOSABLE) ×3
KIT BASIN OR (CUSTOM PROCEDURE TRAY) ×4 IMPLANT
KIT ROOM TURNOVER OR (KITS) ×4 IMPLANT
MARKER SKIN DUAL TIP RULER LAB (MISCELLANEOUS) ×3 IMPLANT
NDL 18GX1X1/2 (RX/OR ONLY) (NEEDLE) ×1 IMPLANT
NDL FILTER BLUNT 18X1 1/2 (NEEDLE) IMPLANT
NDL HYPO 25GX1X1/2 BEV (NEEDLE) ×1 IMPLANT
NEEDLE 18GX1X1/2 (RX/OR ONLY) (NEEDLE) IMPLANT
NEEDLE FILTER BLUNT 18X 1/2SAF (NEEDLE)
NEEDLE FILTER BLUNT 18X1 1/2 (NEEDLE) IMPLANT
NEEDLE HYPO 25GX1X1/2 BEV (NEEDLE) ×3 IMPLANT
NS IRRIG 1000ML POUR BTL (IV SOLUTION) ×9 IMPLANT
PACK GENERAL/GYN (CUSTOM PROCEDURE TRAY) ×4 IMPLANT
PAD ABD 8X10 STRL (GAUZE/BANDAGES/DRESSINGS) ×4 IMPLANT
PAD ARMBOARD 7.5X6 YLW CONV (MISCELLANEOUS) ×9 IMPLANT
PIN SAFETY STERILE (MISCELLANEOUS) ×3 IMPLANT
SET ASEPTIC TRANSFER (MISCELLANEOUS) ×3 IMPLANT
SOL PREP POV-IOD 4OZ 10% (MISCELLANEOUS) ×3 IMPLANT
SPECIMEN JAR X LARGE (MISCELLANEOUS) ×3 IMPLANT
SPONGE LAP 18X18 X RAY DECT (DISPOSABLE) ×2 IMPLANT
STAPLER VISISTAT 35W (STAPLE) ×2 IMPLANT
SUT CHROMIC 4 0 PS 2 18 (SUTURE) IMPLANT
SUT ETHILON 2 0 FS 18 (SUTURE) ×6 IMPLANT
SUT ETHILON 3 0 FSL (SUTURE) ×3 IMPLANT
SUT MNCRL 3 0 RB1 (SUTURE) IMPLANT
SUT MNCRL AB 4-0 PS2 18 (SUTURE) ×2 IMPLANT
SUT MON AB 4-0 PC3 18 (SUTURE) ×1 IMPLANT
SUT MONOCRYL 3 0 RB1 (SUTURE)
SUT SILK 2 0 FS (SUTURE) ×2 IMPLANT
SUT SILK 2 0 SH (SUTURE) IMPLANT
SUT VIC AB 0 CT1 27 (SUTURE) ×6
SUT VIC AB 0 CT1 27XBRD ANBCTR (SUTURE) IMPLANT
SUT VIC AB 3-0 SH 27 (SUTURE) ×15
SUT VIC AB 3-0 SH 27X BRD (SUTURE) IMPLANT
SUT VIC AB 3-0 SH 27XBRD (SUTURE) ×1 IMPLANT
SUT VICRYL 3 0 (SUTURE) IMPLANT
SUT VICRYL 4-0 PS2 18IN ABS (SUTURE) ×2 IMPLANT
SUT VLOC 180 0 24IN GS25 (SUTURE) IMPLANT
SYR BULB IRRIGATION 50ML (SYRINGE) ×3 IMPLANT
SYR CONTROL 10ML LL (SYRINGE) ×3 IMPLANT
TAPE STRIPS DRAPE STRL (GAUZE/BANDAGES/DRESSINGS) ×3 IMPLANT
TISSUE ALLDRM 8X16 MED THICK (Tissue) IMPLANT
TISSUE EXPANDER MX 500CC (Prosthesis & Implant Plastic) ×3 IMPLANT
TOWEL OR 17X24 6PK STRL BLUE (TOWEL DISPOSABLE) ×6 IMPLANT
TOWEL OR 17X26 10 PK STRL BLUE (TOWEL DISPOSABLE) ×6 IMPLANT
TRAY FOLEY CATH 16FRSI W/METER (SET/KITS/TRAYS/PACK) ×2 IMPLANT
TRAY FOLEY W/METER SILVER 16FR (SET/KITS/TRAYS/PACK) IMPLANT
TUBE CONNECTING 12'X1/4 (SUCTIONS) ×1
TUBE CONNECTING 12X1/4 (SUCTIONS) ×2 IMPLANT
WATER STERILE IRR 1000ML POUR (IV SOLUTION) IMPLANT

## 2016-10-13 NOTE — Anesthesia Preprocedure Evaluation (Signed)
Anesthesia Evaluation  Patient identified by MRN, date of birth, ID band Patient awake    Reviewed: Allergy & Precautions, H&P , NPO status , Patient's Chart, lab work & pertinent test results, reviewed documented beta blocker date and time   History of Anesthesia Complications Negative for: history of anesthetic complications  Airway Mallampati: III  TM Distance: >3 FB Neck ROM: Full    Dental no notable dental hx. (+) Dental Advisory Given, Poor Dentition, Missing, Chipped   Pulmonary COPD, Current Smoker, former smoker,    Pulmonary exam normal breath sounds clear to auscultation       Cardiovascular hypertension, Pt. on medications (-) angina Rhythm:Regular Rate:Normal  '04 ECHO: normal LVF and valves   Neuro/Psych Anxiety negative neurological ROS     GI/Hepatic Neg liver ROS, GERD  Controlled,  Endo/Other  Hypothyroidism Morbid obesity  Renal/GU negative Renal ROS     Musculoskeletal  (+) Arthritis , Osteoarthritis,    Abdominal (+) + obese,   Peds  Hematology negative hematology ROS (+)   Anesthesia Other Findings   Reproductive/Obstetrics                             Anesthesia Physical  Anesthesia Plan  ASA: III  Anesthesia Plan: General   Post-op Pain Management:    Induction: Intravenous  PONV Risk Score and Plan: 2 and 3 and Ondansetron, Dexamethasone, Propofol and Treatment may vary due to age or medical condition  Airway Management Planned: Double Lumen EBT  Additional Equipment: Arterial line, CVP and Ultrasound Guidance Line Placement  Intra-op Plan:   Post-operative Plan: Extubation in OR  Informed Consent: I have reviewed the patients History and Physical, chart, labs and discussed the procedure including the risks, benefits and alternatives for the proposed anesthesia with the patient or authorized representative who has indicated his/her understanding and  acceptance.   Dental advisory given  Plan Discussed with: Anesthesiologist, Surgeon and CRNA  Anesthesia Plan Comments: (Plan routine monitors, A-line, CVP, GETA with DLT)        Anesthesia Quick Evaluation

## 2016-10-13 NOTE — Anesthesia Procedure Notes (Cosign Needed)
Procedure Name: Intubation Date/Time: 10/13/2016 12:06 PM Performed by: Lyndle Herrlich Pre-anesthesia Checklist: Patient identified, Emergency Drugs available, Suction available and Patient being monitored Patient Re-evaluated:Patient Re-evaluated prior to induction Oxygen Delivery Method: Circle system utilized Preoxygenation: Pre-oxygenation with 100% oxygen Induction Type: IV induction Ventilation: Mask ventilation without difficulty Laryngoscope Size: Miller and 2 Grade View: Grade I Tube type: Oral Tube size: 7.0 mm Number of attempts: 2 Airway Equipment and Method: Stylet Placement Confirmation: ETT inserted through vocal cords under direct vision,  positive ETCO2 and breath sounds checked- equal and bilateral Secured at: 23 cm Tube secured with: Tape Dental Injury: Teeth and Oropharynx as per pre-operative assessment  Comments: First DL with MAC 4. Grad IV view. Patient mask ventilated. Second DL with Sabra Heck 2. Grade I view.

## 2016-10-13 NOTE — Transfer of Care (Cosign Needed)
Immediate Anesthesia Transfer of Care Note  Patient: Taylor Walsh  Procedure(s) Performed: Procedure(s): LEFT TOTAL MASTECTOMY WITH LEFT AXILLARY SENTINEL LYMPH NODE BIOPSY (Left) LEFT BREAST RECONSTRUCTION WITH PLACEMENT OF TISSUE EXPANDER AND ALLODERM (Left)  Patient Location: PACU  Anesthesia Type:GA combined with regional for post-op pain  Level of Consciousness: drowsy, patient cooperative and responds to stimulation  Airway & Oxygen Therapy: Patient Spontanous Breathing and Patient connected to nasal cannula oxygen  Post-op Assessment: Report given to RN and Post -op Vital signs reviewed and stable  Post vital signs: Reviewed and stable  Last Vitals:  Vitals:   10/13/16 1006 10/13/16 1513  BP: 129/88   Pulse: 68   Resp: 18   Temp: 36.9 C 36.6 C  BP 118/63 HR 66 RR 16 SPO2 100% on 3L [place]  Last Pain:  Vitals:   10/13/16 1006  TempSrc: Oral      Patients Stated Pain Goal: 5 (37/16/96 7893)  Complications: No apparent anesthesia complications

## 2016-10-13 NOTE — Op Note (Signed)
Operative Note   DATE OF OPERATION: 7.17.18  LOCATION: [facility]-observ[facility]  SURGICAL DIVISION: Plastic Surgery  PREOPERATIVE DIAGNOSES:  DCIS left breast  POSTOPERATIVE DIAGNOSES:  same  PROCEDURE:  1. Left breast reconstruction with tissue expander 2. Acellular dermis (Alloderm) for breast reconstruction 120 cm2  SURGEON: Irene Limbo MD MBA  ASSISTANT: none  ANESTHESIA:  General.   EBL: 50 ml for entire procedure  COMPLICATIONS: None immediate.   INDICATIONS FOR PROCEDURE:  The patient, Taylor Walsh, is a 61 y.o. female born on 1955-04-26, is here for immediate expander, ADM reconstruction following skin reduction pattern mastectomy.   FINDINGS: Natrelle 133MX-13-T 500 ml tissue expander, initial fill volume 200 ml. SN [id number]  DESCRIPTION OF PROCEDURE:  The patient was marked with the patient in the preoperative area to mark sternal notch, chest midline, anterior axillary lines and inframammary folds. Patient was marked for skin reduction mastectomy with most superior portion nipple areola marked on breast meridian. Vertical limbs marked by breast displacement and set at 9 cm length. The patient was taken to the operating room. SCDs were placed and IV antibiotics were given. Foley catheter placed. The patient's operative site was prepped and draped in a sterile fashion. A time out was performed and all information was confirmed to be correct. In supine position, the lateral limbs for resection marked and area over lower pole preserved as inferiorly based dermal pedicle. Skin de epithelialized in this area.  Following completion of mastectomy, the lower border pectoralis muscle identified. Submuscular dissection then completed to accommodate the expander. Acellular dermis prepared and perforated. This was inset to chest wall with running 3-0 vicryl. The cavity was irrigated with Ancef, gentamicin, bacitracin solution and hemostasis ensured. 19Fr JP placed in submuscular  cavity and secured with 2-0 nylon. The cavity was then irrigated with Betadine. Expander prepared and placed in submuscular cavity. This was secured to chest wall with 3-0 vicryl. Acellular dermis was then wrapped over lower border expander and secured to lateral pectoralis muscle with 3-0 vicryl. Laterally the mastectomy flap over posterior axillary line was advanced anteriorly and the subcutaneous tissue and superficial fascia was secured to pectoralis muscle and acellular dermis with 0-vicryl. The inferiorly based dermal pedicle was redraped superiorly over expander and acellular dermis and secured to pectoralis with interrupted 0-vicryl. A 15 Fr JP was placed in subcutaneous position beneath mastectomy flaps. Skin closure completedwith 3-0 vicryl in fascial layer and 4-0 vicryl in dermis. Skin closure completed with 4-0 monocryl subcuticular and tissue adhesive. The ports were accessed and filled to 200 ml of saline on each side. Tegaderms applied over skin flaps. Dry dressing and breast binder applied.  The patient was allowed to wake from anesthesia, extubated and taken to the recovery room in satisfactory condition.   SPECIMENS: additional mastectomy flap  DRAINS: 15 Fr JP in subcutaneous tissue, 19 Fr JP submuscular  Irene Limbo, MD [facility] Plastic & Reconstructive Surgery [phone], pin [phone]

## 2016-10-13 NOTE — Interval H&P Note (Signed)
History and Physical Interval Note: No change in her H and P.  I have taken over her care from Dr. Dalbert Batman because of a personal emergency.  I have discussed her diagnosis yesterday with her by phone and today in person and have examined her and reviewed all her records, etc.  She agrees to the original surgical plans and agrees with me assuming her care.  10/13/2016 11:44 AM  Taylor Walsh  has presented today for surgery, with the diagnosis of LEFT BREAST CANCER  The various methods of treatment have been discussed with the patient and family. After consideration of risks, benefits and other options for treatment, the patient has consented to  Procedure(s): LEFT TOTAL MASTECTOMY WITH LEFT AXILLARY SENTINEL LYMPH NODE BIOPSY (Left) LEFT BREAST RECONSTRUCTION WITH PLACEMENT OF TISSUE EXPANDER AND ALLODERM (Left) as a surgical intervention .  The patient's history has been reviewed, patient examined, no change in status, stable for surgery.  I have reviewed the patient's chart and labs.  Questions were answered to the patient's satisfaction.     Taylor Walsh A

## 2016-10-13 NOTE — Interval H&P Note (Signed)
History and Physical Interval Note:  10/13/2016 10:24 AM  Taylor Walsh  has presented today for surgery, with the diagnosis of LEFT BREAST CANCER  The various methods of treatment have been discussed with the patient and family. After consideration of risks, benefits and other options for treatment, the patient has consented to  Procedure(s): LEFT TOTAL MASTECTOMY WITH LEFT AXILLARY SENTINEL LYMPH NODE BIOPSY (Left) LEFT BREAST RECONSTRUCTION WITH PLACEMENT OF TISSUE EXPANDER AND ALLODERM (Left) as a surgical intervention .  The patient's history has been reviewed, patient examined, no change in status, stable for surgery.  I have reviewed the patient's chart and labs.  Questions were answered to the patient's satisfaction.     Christohper Dube

## 2016-10-13 NOTE — Anesthesia Procedure Notes (Signed)
Anesthesia Regional Block: Pectoralis block   Pre-Anesthetic Checklist: ,, timeout performed, Correct Patient, Correct Site, Correct Laterality, Correct Procedure, Correct Position, site marked, Risks and benefits discussed, pre-op evaluation,  At surgeon's request and post-op pain management  Laterality: Left  Prep: chloraprep       Needles:   Needle Type: Echogenic Needle     Needle Length: 9cm  Needle Gauge: 21     Additional Needles:   Procedures: ultrasound guided,,,,,,,,  Narrative:  Start time: 10/13/2016 11:03 AM End time: 10/13/2016 11:10 AM Injection made incrementally with aspirations every 5 mL. Anesthesiologist: Lyndle Herrlich

## 2016-10-13 NOTE — Op Note (Signed)
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 After long discussion, mastectomy with sentinel with the biopsy and immediate reconstruction has been recommended.  Procedure: The patient was identified in the holding areas the correct patient. The radiation technologist injected radioactive isotope around her areola. She was then taken to the operating room. She was placed in the supine position on the operating table and general anesthesia was induced. Her breast and axilla were then prepped and draped in usual sterile fashion. The area of the mastectomy was marked on the left breast. I debated incision circumferentially on the breast around the areola. I then dissected down as the breast tissue with the electrocautery. I then circumferentially work around the skin flaps with electrocautery first on the medial aspect moving towards the sternum and then the inferior aspect dissecting toward the inframammary ridge. I then dissected out the superior flap going toward the clavicle and axilla. Several areas felt slightly thick and selected or breast tissue both inferiorly and at the axilla with the cautery. I then dissected down to the chest wall. I then dissected the breast off the chest wall with electrocautery moving medial to lateral. The pectoralis muscles were identified as a procedure axilla. The  neoprobe was brought to the field. I identified an area of increased uptake in the axilla. I dissected down into the deep axilla. I identified several matted nodes together and remove these en bloc with electrocautery. These were the simple lymph nodes. I then examined the nodal basins and failed no other increase uptake of radioactive isotope. I palpated no other enlarged nodes. This point having completed the mastectomy excising some further axillary tissue as well at the tail of Spence. The specimen was then marked laterally.  We then irrigated the operative site was saline. Hemostasis appeared to be achieved. I placed surgical clips and one bleeding vessel to achieve hemostasis. I was assisted by Dr. Iran Planas during the mastectomy and then she took over the case to completely immediate reconstruction. She will dictate this portion of the procedure.        Taylor Walsh A   Date: 10/13/2016  Time: 1:12 PM

## 2016-10-14 ENCOUNTER — Encounter (HOSPITAL_COMMUNITY): Payer: Self-pay | Admitting: Surgery

## 2016-10-14 DIAGNOSIS — J320 Chronic maxillary sinusitis: Secondary | ICD-10-CM | POA: Diagnosis not present

## 2016-10-14 MED ORDER — DOXYCYCLINE HYCLATE 50 MG PO CAPS: 50.0000 mg | ORAL_CAPSULE | Freq: Two times a day (BID) | ORAL | 0 refills | Status: DC

## 2016-10-14 MED ORDER — OXYCODONE HCL 5 MG PO TABS: 5.0000 mg | ORAL_TABLET | ORAL | 0 refills | Status: DC | PRN

## 2016-10-14 MED ORDER — METHOCARBAMOL 500 MG PO TABS: 500.0000 mg | ORAL_TABLET | Freq: Three times a day (TID) | ORAL | 0 refills | Status: DC | PRN

## 2016-10-14 NOTE — Discharge Summary (Signed)
Physician Discharge Summary  Patient ID: Taylor Walsh MRN: [id number] DOB/AGE: Aug 07, 1955 61 y.o.  Admit date: 10/13/2016 Discharge date: 10/14/2016  Admission Diagnoses: DCIS left breast  Discharge Diagnoses:  Active Problems:   DCIS (ductal carcinoma in situ) of breast  Discharged Condition: stable  Hospital Course: Patient did well postoperatively tolerating oxycodone for pain and tolerating diet. She was ambulatory within room with minimal assist. Reviewed binder and limitations.  Treatments: surgery: left skin reduction pattern mastectomy with SLN, left reconstruction with tissue expander, acellular dermis  Discharge Exam: Blood pressure 105/69, pulse 63, temperature 98.1 F (36.7 C), temperature source Oral, resp. rate 17, height 5\' 3"  (1.6 m), weight 88.5 kg (195 lb), SpO2 97 %. Incision/Wound: chest soft with incisions intact, Tegaderms in place, developing ecchymoses upper outer quadrant, drains serosanguinous  Disposition: 01-Home or Self Care  Discharge Instructions    Call MD for:  redness, tenderness, or signs of infection (pain, swelling, bleeding, redness, odor or green/yellow discharge around incision site)    Complete by:  As directed    Call MD for:  temperature >100.5    Complete by:  As directed    Discharge instructions    Complete by:  As directed    Ok to shower am 7.19.18. Soap and water ok, pat Teagerms dry. Do not remove Tegaderms. No creams or ointments over incisions. Do not let drains dangle in shower, attach to lanyard or similar.Strip and record drains twice daily and bring log to clinic visit.  Breast binder or soft compression bra all other times.  Ok to raise arms above shoulders for bathing and dressing.  No house yard work or exercise until cleared by MD.   Reino Bellis Miralax as needed for constipation. Recommend ibuprofen as directed with meals. It is also ok to take Tylenol as needed.   Driving Restrictions    Complete by:  As directed    No driving for 2 weeks and if taking narcotics   Lifting restrictions    Complete by:  As directed    No lifting > 5 lbs until cleared by MD   Resume previous diet    Complete by:  As directed      Allergies as of 10/14/2016      Reactions   Vicodin [hydrocodone-acetaminophen] Swelling, Other (See Comments)   SWELLING REACTION UNSPECIFIED       Medication List    STOP taking these medications   traMADol 50 MG tablet Commonly known as:  ULTRAM     TAKE these medications   aspirin 81 MG tablet Take 81 mg by mouth daily.   atorvastatin 40 MG tablet Commonly known as:  LIPITOR Take 40 mg by mouth daily.   calcium-vitamin D 250-100 MG-UNIT tablet Take 2 tablets by mouth daily.   doxycycline 50 MG capsule Commonly known as:  VIBRAMYCIN Take 1 capsule (50 mg total) by mouth 2 (two) times daily.   ergocalciferol 50000 units capsule Commonly known as:  VITAMIN D2 Take 50,000 Units by mouth every Friday.   levothyroxine 88 MCG tablet Commonly known as:  SYNTHROID, LEVOTHROID Take 88 mcg by mouth daily before breakfast.   methocarbamol 500 MG tablet Commonly known as:  ROBAXIN Take 1 tablet (500 mg total) by mouth every 8 (eight) hours as needed for muscle spasms.   multivitamin with minerals tablet Take 1 tablet by mouth daily.   oxyCODONE 5 MG immediate release tablet Commonly known as:  Oxy IR/ROXICODONE Take 1-2 tablets (5-10 mg total) by mouth  every 4 (four) hours as needed for moderate pain.   valACYclovir 1000 MG tablet Commonly known as:  VALTREX Take 1,000 mg by mouth daily as needed (for sores).   valsartan 160 MG tablet Commonly known as:  DIOVAN Take 160 mg by mouth daily.      Follow-up Information    Irene Limbo, MD In 1 week.   Specialty:  Plastic Surgery Why:  as scheduled Contact information: [place] [address] [phone]           Signed: Irene Limbo 10/14/2016, 7:21 AM

## 2016-10-14 NOTE — Anesthesia Postprocedure Evaluation (Addendum)
Anesthesia Post Note  Patient: Taylor Walsh  Procedure(s) Performed: Procedure(s) (LRB): LEFT TOTAL MASTECTOMY WITH LEFT AXILLARY SENTINEL LYMPH NODE BIOPSY (Left) LEFT BREAST RECONSTRUCTION WITH PLACEMENT OF TISSUE EXPANDER AND ALLODERM (Left)     Patient location during evaluation: PACU Anesthesia Type: General Level of consciousness: awake and alert Pain management: pain level controlled Vital Signs Assessment: post-procedure vital signs reviewed and stable Respiratory status: spontaneous breathing, nonlabored ventilation, respiratory function stable and patient connected to nasal cannula oxygen Cardiovascular status: blood pressure returned to baseline and stable Postop Assessment: no signs of nausea or vomiting Anesthetic complications: no    Last Vitals:  Vitals:   10/14/16 0346 10/14/16 0539  BP: (!) 102/50 105/69  Pulse: 94 63  Resp:    Temp: 36.7 C 36.7 C    Last Pain:  Vitals:   10/14/16 0631  TempSrc:   PainSc: 0-No pain                 Schwanda Zima EDWARD

## 2016-10-14 NOTE — Progress Notes (Signed)
Patient ID: Taylor Walsh, female   DOB: 05-03-1955, 61 y.o.   MRN: [id number]   Doing well Pain controlled Dressings dry  Awaiting final path  Discharged already by Plastic Surg  I will call her with the final path report when it is available

## 2016-10-14 NOTE — Progress Notes (Addendum)
Taylor Walsh to be D/C'd  per MD order. Discussed with the patient and all questions fully answered.  VSS, Skin clean, dry and intact without evidence of skin break down, no evidence of skin tears noted.  IV catheter discontinued intact. Site without signs and symptoms of complications. Dressing and pressure applied.  An After Visit Summary was printed and given to the patient. Patient received prescription.  D/c education completed with patient/family including follow up instructions, medication list, d/c activities limitations if indicated, with other d/c instructions as indicated by MD - patient able to verbalize understanding, all questions fully answered.   Patient instructed to return to ED, call 911, or call MD for any changes in condition.   Patient given JP drain sheet and reviewed with her.    Patient to be escorted via [place], and D/C home via private auto.  Awaiting pt's ride.

## 2016-10-19 DIAGNOSIS — Z9012 Acquired absence of left breast and nipple: Secondary | ICD-10-CM | POA: Insufficient documentation

## 2016-10-28 ENCOUNTER — Telehealth: Payer: Self-pay | Admitting: *Deleted

## 2016-10-28 NOTE — Telephone Encounter (Signed)
Received order for mammaprint testing. Requisition sent to pathology. Received by Varney Biles.  Left vm for pt to return call to discuss moving appt with Dr. Burr Medico

## 2016-11-02 ENCOUNTER — Ambulatory Visit: Payer: BC Managed Care – PPO | Admitting: Hematology

## 2016-11-02 ENCOUNTER — Encounter (HOSPITAL_COMMUNITY): Payer: Self-pay | Admitting: Surgery

## 2016-11-09 ENCOUNTER — Telehealth: Payer: Self-pay | Admitting: *Deleted

## 2016-11-09 NOTE — Telephone Encounter (Signed)
Received mammaprint results of high risk.  Copy to medical records and copy to Dr. Burr Medico.

## 2016-11-09 NOTE — Telephone Encounter (Signed)
Spoke with patient and confirmed new appointment with Dr. Burr Medico for 11/11/16 at 9930pm.

## 2016-11-09 NOTE — Progress Notes (Signed)
[place]  [phone] [phone]  Clinic Follow-up Note   Patient Care Team: Marton Redwood, MD as PCP - General (Internal Medicine) Melrose Nakayama, MD as Consulting Physician (Cardiothoracic Surgery) Fanny Skates, MD as Consulting Physician (General Surgery) Truitt Merle, MD as Consulting Physician (Hematology) 11/11/2016   CHIEF COMPLAINTS F/U left breast cancer   Oncology History   Cancer Staging Breast cancer of upper-inner quadrant of left female breast [facility]) Staging form: Breast, AJCC 8th Edition - Clinical stage from 07/10/2016: Stage 0 (cTis (DCIS), cN0, cM0, G3, ER: Positive, PR: Positive, HER2: Not Assessed) - Signed by Truitt Merle, MD on 07/28/2016 - Pathologic stage from 10/26/2016: Stage IA (pT1a, pN1a, cM0, G2, ER: Positive, PR: Positive, HER2: Negative) - Signed by Truitt Merle, MD on 11/11/2016       Breast cancer of upper-inner quadrant of left female breast ([place])   07/10/2016 Initial Biopsy    Diagnosis 1. 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 2. 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      07/10/2016 Receptors her2    ER 95%, PR 80-90%+, both strong staining, Ki67 80-90%      07/22/2016 Initial Diagnosis    Ductal carcinoma in situ (DCIS) of left breast     07/27/2016 Imaging    Breast MRI w wo contrast showed  1. Extensive mass and non mass enhancement along the lower inner aspect the left breast, lower outer quadrant, spanning 10.2 cm from anterior to posterior, including both biopsied lesions, as detailed above. This is all consistent with DCIS. 2. No other abnormal enhancement in the left breast. 3. No abnormal or enlarged axillary lymph nodes. 4. No evidence of malignancy in the right breast      08/10/2016 Genetic Testing    ATM c.4066A>G VUS identified on the Common Hereditary Cancer panel. The Hereditary Gene Panel offered by [facility] includes sequencing and/or deletion duplication testing of the following 46 genes: APC, ATM, AXIN2, BARD1, BMPR1A, BRCA1, BRCA2, BRIP1, CDH1, CDKN2A (p14ARF), CDKN2A (p16INK4a), CHEK2, CTNNA1, DICER1, EPCAM (Deletion/duplication testing only), GREM1 (promoter region deletion/duplication testing only), KIT, MEN1, MLH1, MSH2, MSH3, MSH6, MUTYH, NBN, NF1, NHTL1, PALB2, PDGFRA, PMS2, POLD1, POLE, PTEN, RAD50, RAD51C, RAD51D, SDHB, SDHC, SDHD, SMAD4, SMARCA4. STK11, TP53, TSC1, TSC2, and VHL.  The following gene was evaluated for sequence changes only: SDHA and HOXB13 c.251G>A variant only.  The report date is Aug 09, 2016.       10/13/2016 Surgery    LEFT TOTAL MASTECTOMY WITH LEFT AXILLARY SENTINEL LYMPH NODE BIOPSY By Dr. Ninfa Linden      10/13/2016 Pathology Results    Surgery Diagnosis 10/13/16  1. Lymph node, sentinel, biopsy, Left Axillary - METASTATIC CARCINOMA IN ONE LYMPH NODE (1/1). 2. Lymph node, sentinel, biopsy, Left - METASTATIC CARCINOMA IN ONE LYMPH NODE (1/1). 3. Breast, radical mastectomy (including lymph nodes), Left - INVASIVE DUCTAL CARCINOMA, 0.3 CM. - LYMPHOVASCULAR INVOLVEMENT BY CARCINOMA. - HIGH GRADE DUCTAL CARCINOMA IN SITU WITH CALCIFICATIONS AND NECROSIS, 5.5 CM. - DCIS FOCALLY 0.1 CM FROM CAUTERIZED MEDIAL MARGIN. - SEE ONCOLOGY TABLE AND COMMENT. 4. Skin , Left additional mastectomy flap - BENIGN FIBROADIPOSE TISSUE. - NO EVIDENCE OF MALIGNANCY.      10/30/2016 Miscellaneous    MammaPrint 10/31/26 Result:  High Risk,Luminal type B MPI: -0.368 Average 10-year risk of  recurrence untreated: 29%       Chemotherapy    PENDING Adjuvant chemotherapy AC every 2 weeks for 4 cycles followed by Taxol weekly for 12 cycles starting 11/25/16         HISTORY OF PRESENTING ILLNESS (07/28/16):  Taylor Walsh 61 y.o. female is  here because of recently diagnosed DCIS of the left breast. She presents to the clinic today by herself. She was referred by her surgeon Dr. Dalbert Batman.   This was discovered by routine screening mammogram. She denies any palpable breast mass or adenopathy. She denies any other new symptoms. She was contacted for her need of a biopsy to further investigate the calcification found in her mammogram. A biopsy was done leading to a diagnosis of her DCIS. She reports having arthritis in the right knee. She reported having a tubal ligation and hysterectomy. She also has famaily history of breast cancer with mother and sister. She reports to live with her daughter and 2 of her grandchildren. She reports of having history of HTN, arthritis, Thyroidism, being Pre-diabetic, Osteopenia and is currently back to Smoking. She also reports her brother has a history of blood clots and he was told that it was hereditary which she is following up with Dr. Brigitte Pulse about testing.   GYN HISTORY  Menarchal: 11 LMP: 36 before hysterectomy Contraceptive: N/A HRT: no GP:G2P2    CURRENT THERAPY: PENDING Adjuvant chemotherapy AC every 2 weeks for 4 cycles followed by Taxol weekly for 12 cycles starting 11/25/16   INTERVAL HISTORY:  Taylor Walsh is here for a follow up. She presents to clinic today post mastectomy with her sister. She reports her recovery is coming along. She is scheduled to start PT next week. She only saw Dr. Dalbert Batman once since surgery and has seen Dr. Iran Planas. She has tissue expender pain more when she gets the saline in. If she moves too quickly or in a certain way it will hurt. She has not had heart issues before but her heart rate has dropped before. She had a EKG before surgery. Her sister has breast cancer and has undergone lumpectomy. Their mother had ovarian cancer. The sisters were tested for their genetics and they were negative.    MEDICAL HISTORY:  Past Medical History:  Diagnosis Date  . Anxiety    . Arthritis   . Cancer ([place]) 10/08/2016   left breast cancer  . Dyslipidemia   . Family history of breast cancer   . Family history of ovarian cancer   . Family history of prostate cancer   . GERD (gastroesophageal reflux disease)    nexium if needed  . Heart murmur   . Hypertension   . Hypothyroid   . Osteopenia   . Pneumonia 10/08/2016   June 2013    SURGICAL HISTORY: Past Surgical History:  Procedure Laterality Date  . ABDOMINAL HYSTERECTOMY    . BREAST RECONSTRUCTION WITH PLACEMENT OF TISSUE EXPANDER AND FLEX HD (ACELLULAR HYDRATED DERMIS) Left 10/13/2016   Procedure: LEFT BREAST RECONSTRUCTION WITH PLACEMENT OF TISSUE EXPANDER AND ALLODERM;  Surgeon: Irene Limbo, MD;  Location: [place];  Service: Plastics;  Laterality: Left;  . FOOT SURGERY     bi-lat/ repaired hammer toes  . MASTECTOMY W/ SENTINEL NODE BIOPSY Left 10/13/2016  . MASTECTOMY W/ SENTINEL NODE BIOPSY Left 10/13/2016   Procedure: LEFT TOTAL MASTECTOMY WITH LEFT AXILLARY SENTINEL LYMPH NODE BIOPSY;  Surgeon: Coralie Keens, MD;  Location: [place];  Service: General;  Laterality: Left;  .  TUBAL LIGATION    . VIDEO ASSISTED THORACOSCOPY (VATS)/WEDGE RESECTION Left 02/25/2015   Procedure: VIDEO ASSISTED THORACOSCOPY (VATS)/ LUL WEDGE RESECTION with ON Q placement.;  Surgeon: Melrose Nakayama, MD;  Location: [place];  Service: Thoracic;  Laterality: Left;    SOCIAL HISTORY: Social History   Social History  . Marital status: Single    Spouse name: N/A  . Number of children: 2  . Years of education: N/A   Occupational History  . Not on file.   Social History Main Topics  . Smoking status: Former Smoker    Packs/day: 1.00    Years: 42.00    Quit date: 08/27/2016  . Smokeless tobacco: Never Used     Comment: 5/1/18she smokes occasionally when stressed. She is smoking 4 cigarettes a week  . Alcohol use No  . Drug use: No  . Sexual activity: Not on file   Other Topics Concern  . Not on file    Social History Narrative  . No narrative on file    FAMILY HISTORY: Family History  Problem Relation Age of Onset  . Cancer Mother        brain, ovarian  . Hypertension Mother   . Stroke Father   . Hypertension Father   . Cancer Sister 69       bilateral breast cancer, 2nd cancer at 61  . Cancer Brother        dx with prostate cancer in the 41s  . Cancer Brother        dx in his 56s with prostate cancer  . Brain cancer Maternal Uncle   . Cirrhosis Brother   . Cancer Brother        NOS  . Cancer Paternal Aunt        NOS  . Cancer Cousin        paternal cousin with cancer NOS    ALLERGIES:  is allergic to vicodin [hydrocodone-acetaminophen].  MEDICATIONS:  Current Outpatient Prescriptions  Medication Sig Dispense Refill  . aspirin 81 MG tablet Take 81 mg by mouth daily.    Marland Kitchen atorvastatin (LIPITOR) 40 MG tablet Take 40 mg by mouth daily.    . calcium-vitamin D 250-100 MG-UNIT tablet Take 2 tablets by mouth daily.     . ergocalciferol (VITAMIN D2) 50000 UNITS capsule Take 50,000 Units by mouth every Friday.     . levothyroxine (SYNTHROID, LEVOTHROID) 88 MCG tablet Take 88 mcg by mouth daily before breakfast.     . methocarbamol (ROBAXIN) 500 MG tablet Take 1 tablet (500 mg total) by mouth every 8 (eight) hours as needed for muscle spasms. 30 tablet 0  . olmesartan (BENICAR) 20 MG tablet Take 20 mg by mouth daily.    . traMADol (ULTRAM) 50 MG tablet Take 50 mg by mouth every 6 (six) hours as needed.    . valACYclovir (VALTREX) 1000 MG tablet Take 1,000 mg by mouth daily as needed (for sores).      No current facility-administered medications for this visit.     REVIEW OF SYSTEMS:  Constitutional: Denies fevers, chills (+) night sweats Eyes: Denies blurriness of vision, double vision or watery eyes Ears, nose, mouth, throat, and face: Denies mucositis or sore throat Respiratory: Denies cough, dyspnea or wheezes Cardiovascular: Denies palpitation, chest discomfort or  lower extremity swelling Gastrointestinal:  Denies nausea, heartburn or change in bowel habits Skin: Denies abnormal skin rashes Lymphatics: Denies new lymphadenopathy or easy bruising Musculoskeletal: (+) arthritis in the right knee Neurological:Denies  numbness, tingling or new weaknesses Breast (+) Left breast pain and soreness  Behavioral/Psych: Mood is stable, no new changes  All other systems were reviewed with the patient and are negative.  PHYSICAL EXAMINATION: ECOG PERFORMANCE STATUS: 0 - Asymptomatic  Vitals:   11/11/16 1538  BP: (!) 141/82  Pulse: 76  Resp: 18  Temp: 99.1 F (37.3 C)  SpO2: 100%   Filed Weights   11/11/16 1538  Weight: 200 lb 1.6 oz (90.8 kg)    GENERAL:alert, no distress and comfortable SKIN: skin color, texture, turgor are normal, no rashes or significant lesions EYES: normal, conjunctiva are pink and non-injected, sclera clear OROPHARYNX:no exudate, no erythema and lips, buccal mucosa, and tongue normal  NECK: supple, thyroid normal size, non-tender, without nodularity LYMPH:  no palpable lymphadenopathy in the cervical, axillary or inguinal LUNGS: clear to auscultation and percussion with normal breathing effort HEART: regular rate & rhythm and no murmurs and no lower extremity edema ABDOMEN:abdomen soft, non-tender and normal bowel sounds Musculoskeletal:no cyanosis of digits and no clubbing  PSYCH: alert & oriented x 3 with fluent speech NEURO: no focal motor/sensory deficits Breasts: Breast inspection showed them to be symmetrical with no nipple discharge. (+) Two small nodules at biopsy side located lower-inner quadrant and next to nipple of left breast. Possible hematoma from biopsy. (+) S/p mastectomy with tissue expander placement with incision elated very well, some tenderness.   LABORATORY DATA:  I have reviewed the data as listed CBC Latest Ref Rng & Units 10/08/2016 02/27/2015 02/26/2015  WBC 4.0 - 10.5 K/uL 5.3 7.7 8.0  Hemoglobin  12.0 - 15.0 g/dL 13.6 11.9(L) 12.2  Hematocrit 36.0 - 46.0 % 40.1 37.2 38.3  Platelets 150 - 400 K/uL 288 222 224    CMP Latest Ref Rng & Units 10/08/2016 02/27/2015 02/26/2015  Glucose 65 - 99 mg/dL 99 135(H) 122(H)  BUN 6 - 20 mg/dL 6 7 <5(L)  Creatinine 0.44 - 1.00 mg/dL 0.84 0.75 0.74  Sodium 135 - 145 mmol/L 138 140 142  Potassium 3.5 - 5.1 mmol/L 3.8 3.8 4.0  Chloride 101 - 111 mmol/L 105 106 109  CO2 22 - 32 mmol/L _0 Calcium 8.9 - 10.3 mg/dL 9.3 8.4(L) 8.2(L)  Total Protein 6.5 - 8.1 g/dL 7.0 5.9(L) -  Total Bilirubin 0.3 - 1.2 mg/dL 1.1 0.7 -  Alkaline Phos 38 - 126 U/L 69 50 -  AST 15 - 41 U/L 26 23 -  ALT 14 - 54 U/L 31 23 -    PATHOLOGY:   Surgery Diagnosis 10/13/16  1. Lymph node, sentinel, biopsy, Left Axillary - METASTATIC CARCINOMA IN ONE LYMPH NODE (1/1). 2. Lymph node, sentinel, biopsy, Left - METASTATIC CARCINOMA IN ONE LYMPH NODE (1/1). 3. Breast, radical mastectomy (including lymph nodes), Left - INVASIVE DUCTAL CARCINOMA, 0.3 CM. - LYMPHOVASCULAR INVOLVEMENT BY CARCINOMA. - HIGH GRADE DUCTAL CARCINOMA IN SITU WITH CALCIFICATIONS AND NECROSIS, 5.5 CM. - DCIS FOCALLY 0.1 CM FROM CAUTERIZED MEDIAL MARGIN. - SEE ONCOLOGY TABLE AND COMMENT. 4. Skin , Left additional mastectomy flap - BENIGN FIBROADIPOSE TISSUE. - NO EVIDENCE OF MALIGNANCY. Microscopic Comment 3. BREAST, INVASIVE TUMOR Procedure: Mastectomy and two sentinel lymph nodes with additional left mastectomy flap. Laterality: Left breast Tumor Size: 0.3 cm invasive carcinoma; 5.5 cm DCIS Histologic Type: Ductal Grade: II Tubular Differentiation: 2 Nuclear Pleomorphism: 2 Mitotic Count: 2 Ductal Carcinoma in Situ (DCIS): Present, high grade with necrosis Extent of Tumor: Skin: Free of tumor Nipple: Free of tumor Microscopic Comment(continued)  Skeletal muscle: Free of tumor Margins: Free of tumor Invasive carcinoma, distance from closest margin: Greater than 1.5 cm DCIS, distance from  closest margin: Less than 0.1 cm from medial margin Regional Lymph Nodes: Number of Lymph Nodes Examined: 2 Number of Sentinel Lymph Nodes Examined: 2 Lymph Nodes with Macrometastases: 2 Lymph Nodes with Micrometastases: 0 Lymph Nodes with Isolated Tumor Cells: 0 Breast Prognostic Profile: Pending Estrogen Receptor: Pending Progesterone Receptor: Pending Her2: Pending Ki-67: Pending Best tumor block for sendout testing: 3[facility] Pathologic Stage Classification (pTNM, AJCC 8th Edition): Primary Tumor (pT): pT1a Regional Lymph Nodes (pN): pN1a Distant Metastases (pM): pMX Comments: There is extensive high grade ductal carcinoma in situ with necrosis. Multiple additional portions of the tissue are examined and there is a 0.3 cm invasive ductal carcinoma associated with lymphovascular involvement by tumor. Breast prognostic profile will be performed on the invasive carcinoma and reported as an addendum. Immunohistochemistry shows positive basal cell staining in the DCIS with calponin, smooth muscle myosin and p63. ADDITIONAL INFORMATION: 3. By immunohistochemistry, the tumor cells are Negative for Her2 (1+). Vicente Males MD Pathologist, Electronic Signature ( Signed 10/21/2016) 3. PROGNOSTIC INDICATORS For Part 3[facility] Results: IMMUNOHISTOCHEMICAL AND MORPHOMETRIC ANALYSIS PERFORMED MANUALLY Estrogen Receptor: 95%, POSITIVE, STRONG STAINING INTENSITY Progesterone Receptor: 45%, POSITIVE, STRONG STAINING INTENSITY Proliferation Marker Ki67: 15% ADDITIONAL INFORMATION:(continued) Results: HER2 - *EQUIVOCAL*. OF NOTE, A TOTAL OF 40 TUMOR CELLS WERE EVALUATED FOR HER2 EXPRESSION. HER2 BY IMMUNOHISTOCHEMISTRY WILL BE PERFORMED AND THE RESULTS REPORTED SEPARATELY. RATIO OF HER2/CEP17 SIGNALS 1.67 AVERAGE HER2 COPY NUMBER PER CELL 4.18   Surgery: 07/10/16 Diagnosis 1. Breast, left, needle core biopsy, upper inner quadrant, anterior (near nipple) - DUCTAL CARCINOMA IN SITU, HIGH NUCLEAR GRADE  WITH NECROSIS AND CALCIFICATIONS. - SEE MICROSCOPIC DESCRIPTION. 2. 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 - SEE MICROSCOPIC DESCRIPTION. Microscopic Comment 1. Prognostic markers have been ordered and will be reported in an addendum. 2. Prognostic markers have been ordered and will be reported in an addendum. Called to the [facility] on 07/13/16. Results: IMMUNOHISTOCHEMICAL AND MORPHOMETRIC ANALYSIS PERFORMED MANUALLY Estrogen Receptor: 95%, POSITIVE, STRONG STAINING INTENSITY Progesterone Receptor: 80%, POSITIVE, STRONG STAINING INTENSITY  Biopsy: 02/25/15 Diagnosis 1. 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 - NO ATYPIA OR MALIGNANCY IDENTIFIED. - SEE COMMENT. 2. Lymph node, biopsy, 12 L - ONE BENIGN LYMPH NODE WITH NO TUMOR SEEN (0/1). 3. Lymph node, biopsy, 9 L - ONE BENIGN LYMPH NODE WITH NO TUMOR SEEN (0/1). Microscopic Comment 1. A Congo Red stain is performed and a crystal violet stain is performed on the lung specimen. The staining pattern (apple green birefringence) coupled with the morphology is consistent with amyloid deposition. Dr. Zenda Alpers, Dr. Fletcher Anon and Dr. Darl Householder have all seen this case in consultation with agreement that amyloid deposition is present. Dr. Donato Heinz has also seen this case in consultation with agreement of the additional findings in the case.   MammaPrint 10/31/26 Result:  High Risk at 94.6% MPI: -0.368 Average 10-year risk of recurrence untreated: 29%   Genetics 07/29/16     RADIOGRAPHIC STUDIES: I have personally reviewed the radiological images as listed and agreed with the findings in the report. Nm Sentinel Node Inj-no Rpt (breast)  Result Date: 11/02/2016 There is no Radiologist interpretation   for this exam.   ASSESSMENT & PLAN:  Taylor Walsh is 61 y.o. who present to the clinic with Ductal carcinoma in situ (DCIS) of left breast  1. 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 - She had mostly DCIS in her breast but There was small component of invasive cancer that has spread to 2 of her lymph nodes.  Her surgical margins were negative. -Her mammaprint showed high-risk disease, luminal type B -Due to her multiple positive lymph nodes, I suggest get a CT and bone scan to rule out any more positive lymph nodes or cancer that has spread anywhere else.  -If no left axillary adenopathy on the CT scan, she may not need axillary lymph node dissection. I'll discuss with Dr. Dalbert Batman. -Based on these results I suggest adjuvant IV chemo therapy and due to positive lymph node she will get radiation afterward with Dr. Isidore Moos.  -We discussed the chemotherapy regimens. Given her multiple positive lymph nodes, I suggest standard dose dense Adriamycin and Cytoxan (AC) every 2 weeks for 4 cycles followed by Taxol weekly for 12 cycles  --Chemotherapy consent: Side effects including but does not not limited to, fatigue, nausea, vomiting, diarrhea, hair loss, neuropathy, fluid retention, renal and kidney dysfunction, neutropenic fever, needed for blood transfusion, bleeding, heart failure, remote risk of leukemia and MDS, were discussed with patient in great detail. She agrees to proceed. -The goal of therapy is curative -The patient and her sister had many questions about chemotherapy, I answered to their satisfaction. -She will get CT and bone scan, ECHO, Chemo class and port placed in 1-2 weeks and start chemo in 2 weeks.    2.Arthritis -She experiences mild arthritis in LE, more specific to her right knee -She is pretty active   3.  Osteopenia: -Last bone density scan ordered by Dr Brigitte Pulse in the last 2 years -Will f/u with PCP and scans every 2 years  4. Smoking cessation -patient stopped smoking but after close friend passing she has started again smoking.  -She smokes approximately 1 pack a week.  -The patient has quit smoking in 07/2016  5. 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 The Hereditary Gene Panel offered by [facility] includes sequencing and/or deletion duplication testing of the following 46 genes: APC, ATM, AXIN2, BARD1, BMPR1A, BRCA1, BRCA2, BRIP1, CDH1, CDKN2A (p14ARF), CDKN2A (p16INK4a), CHEK2, CTNNA1, DICER1, EPCAM (Deletion/duplication testing only), GREM1 (promoter region deletion/duplication testing only), KIT, MEN1, MLH1, MSH2, MSH3, MSH6, MUTYH, NBN, NF1, NHTL1, PALB2, PDGFRA, PMS2, POLD1, POLE, PTEN, RAD50, RAD51C, RAD51D, SDHB, SDHC, SDHD, SMAD4, SMARCA4. STK11, TP53, TSC1, TSC2, and VHL.  The following gene was evaluated for sequence changes only: SDHA and HOXB13 c.251G>A variant only.  The report date is Aug 09, 2016.   PLAN:  --port placement by Dr. Dalbert Batman  -Order EMLA cream, and Zofran/compazine to be used after the start of chemo -Chemo class and echo in 1-2 weeks -CT CAP with contrast and bone scan in 1-2 weeks  -Lab, flush, f/u and chemo AC in 2 and 4 weeks    Orders Placed This Encounter  Procedures  . CT Abdomen Pelvis W Contrast    Standing Status:   Future    Standing Expiration Date:   11/11/2017    Order Specific Question:   If indicated for the ordered procedure, I authorize the administration of contrast media per Radiology protocol  Answer:   Yes    Order Specific Question:   Preferred imaging location?    Answer:   [facility]    Order Specific Question:   Radiology Contrast Protocol - do NOT remove file path    Answer:   [email or web address]  . CT Chest W Contrast    Standing Status:   Future    Standing  Expiration Date:   11/11/2017    Order Specific Question:   If indicated for the ordered procedure, I authorize the administration of contrast media per Radiology protocol    Answer:   Yes    Order Specific Question:   Preferred imaging location?    Answer:   [facility]    Order Specific Question:   Radiology Contrast Protocol - do NOT remove file path    Answer:   [email or web address]  . ECHOCARDIOGRAM COMPLETE    Standing Status:   Future    Standing Expiration Date:   02/11/2018    Scheduling Instructions:     Before chemo    Order Specific Question:   Where should this test be performed    Answer:   [place]    Order Specific Question:   Perflutren DEFINITY (image enhancing agent) should be administered unless hypersensitivity or allergy exist    Answer:   Administer Perflutren    Order Specific Question:   Expected Date:    Answer:   1 week    All questions were answered. The patient knows to call the clinic with any problems, questions or concerns. I spent 35 minutes counseling the patient face to face. The total time spent in the appointment was 40 minutes and more than 50% was on counseling.     Truitt Merle, MD 11/11/2016   This document serves as a record of services personally performed by Truitt Merle, MD. It was created on her behalf by Joslyn Devon, a trained medical scribe. The creation of this record is based on the scribe's personal observations and the provider's statements to them. This document has been checked and approved by the attending provider.

## 2016-11-11 ENCOUNTER — Encounter: Payer: Self-pay | Admitting: Hematology

## 2016-11-11 ENCOUNTER — Ambulatory Visit: Payer: BC Managed Care – PPO | Attending: Plastic Surgery | Admitting: Physical Therapy

## 2016-11-11 ENCOUNTER — Encounter: Payer: Self-pay | Admitting: Physical Therapy

## 2016-11-11 ENCOUNTER — Ambulatory Visit (HOSPITAL_BASED_OUTPATIENT_CLINIC_OR_DEPARTMENT_OTHER): Payer: BC Managed Care – PPO | Admitting: Hematology

## 2016-11-11 ENCOUNTER — Telehealth: Payer: Self-pay | Admitting: Hematology

## 2016-11-11 VITALS — BP 141/82 | HR 76 | Temp 99.1°F | Resp 18 | Ht 63.0 in | Wt 200.1 lb

## 2016-11-11 DIAGNOSIS — M25612 Stiffness of left shoulder, not elsewhere classified: Secondary | ICD-10-CM

## 2016-11-11 DIAGNOSIS — M25512 Pain in left shoulder: Secondary | ICD-10-CM | POA: Diagnosis present

## 2016-11-11 DIAGNOSIS — R600 Localized edema: Secondary | ICD-10-CM | POA: Diagnosis present

## 2016-11-11 DIAGNOSIS — C50212 Malignant neoplasm of upper-inner quadrant of left female breast: Secondary | ICD-10-CM

## 2016-11-11 DIAGNOSIS — M8580 Other specified disorders of bone density and structure, unspecified site: Secondary | ICD-10-CM | POA: Diagnosis not present

## 2016-11-11 DIAGNOSIS — Z170 Estrogen receptor positive status [ER+]: Secondary | ICD-10-CM

## 2016-11-11 DIAGNOSIS — C773 Secondary and unspecified malignant neoplasm of axilla and upper limb lymph nodes: Secondary | ICD-10-CM | POA: Diagnosis not present

## 2016-11-11 DIAGNOSIS — Z720 Tobacco use: Secondary | ICD-10-CM | POA: Diagnosis not present

## 2016-11-11 DIAGNOSIS — I10 Essential (primary) hypertension: Secondary | ICD-10-CM

## 2016-11-11 DIAGNOSIS — M6281 Muscle weakness (generalized): Secondary | ICD-10-CM

## 2016-11-11 NOTE — Telephone Encounter (Signed)
Scheduled appt per 8/15 los - scheduled apts but unable to scheduled treatment due to no treatment plan in . Gave patient AVS and calender per los [facility] radiology to contact patient with CT schedule.

## 2016-11-11 NOTE — Therapy (Signed)
[place], [place], [address] Phone: [phone]   Fax:  [phone]  Physical Therapy Evaluation  Patient Details  Name: Taylor Walsh MRN: [id number] Date of Birth: Aug 23, 1955 Referring Provider: Thimmappa  Encounter Date: 11/11/2016      PT End of Session - 11/11/16 1212    Visit Number 1   Number of Visits 9   Date for PT Re-Evaluation 12/09/16   PT Start Time 0932   PT Stop Time 1017   PT Time Calculation (min) 45 min   Activity Tolerance Patient tolerated treatment well   Behavior During Therapy [facility] for tasks assessed/performed      Past Medical History:  Diagnosis Date  . Anxiety   . Arthritis   . Cancer ([place]) 10/08/2016   left breast cancer  . Dyslipidemia   . Family history of breast cancer   . Family history of ovarian cancer   . Family history of prostate cancer   . GERD (gastroesophageal reflux disease)    nexium if needed  . Heart murmur   . Hypertension   . Hypothyroid   . Osteopenia   . Pneumonia 10/08/2016   June 2013    Past Surgical History:  Procedure Laterality Date  . ABDOMINAL HYSTERECTOMY    . BREAST RECONSTRUCTION WITH PLACEMENT OF TISSUE EXPANDER AND FLEX HD (ACELLULAR HYDRATED DERMIS) Left 10/13/2016   Procedure: LEFT BREAST RECONSTRUCTION WITH PLACEMENT OF TISSUE EXPANDER AND ALLODERM;  Surgeon: Irene Limbo, MD;  Location: [place];  Service: Plastics;  Laterality: Left;  . FOOT SURGERY     bi-lat/ repaired hammer toes  . MASTECTOMY W/ SENTINEL NODE BIOPSY Left 10/13/2016  . MASTECTOMY W/ SENTINEL NODE BIOPSY Left 10/13/2016   Procedure: LEFT TOTAL MASTECTOMY WITH LEFT AXILLARY SENTINEL LYMPH NODE BIOPSY;  Surgeon: Coralie Keens, MD;  Location: [place];  Service: General;  Laterality: Left;  . TUBAL LIGATION    . VIDEO ASSISTED THORACOSCOPY (VATS)/WEDGE RESECTION Left 02/25/2015   Procedure: VIDEO ASSISTED THORACOSCOPY (VATS)/ LUL WEDGE RESECTION with  ON Q placement.;  Surgeon: Melrose Nakayama, MD;  Location: [place];  Service: Thoracic;  Laterality: Left;    There were no vitals filed for this visit.       Subjective Assessment - 11/11/16 0939    Subjective I feel like I am having swelling. It feel it when I put my left arm down. Everytime I get the saline I have pain in my chest. It hurts to bend forward and it feels like it is pinching. I can lift my left arm but I feel tightness when I get about halfway up.    Pertinent History Presented following screening MMG with new left breast calcifications. 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 MRI showed mass and NME spanning 10.2 cm from anterior to posterior, including both biopsied lesions. No abnormal or enlarged LN, and no evidence of malignancy in the right breast. Final pathology mastectomy 0.3 cm IDC, 5.5 cm high grade DCIS focally 0.1 cm from medial margin, 2/2 SLN +, +LVI. 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 No/denies  [facility] PT Assessment - 11/11/16 0001      Assessment   Medical Diagnosis left DCIS breast cancer   Referring Provider Thimmappa   Onset Date/Surgical Date 10/13/16   Hand Dominance Right   Prior Therapy none     Precautions   Precautions Other (comment)  at risk for lymphedema     Restrictions   Weight Bearing Restrictions No     Balance Screen   Has the patient fallen in the past 6 months No   Has the patient had a decrease in activity level because of a fear of falling?  No   Is the patient reluctant to leave their home because of a fear of falling?  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   Vocation Requirements accounting specialist- desk work   Leisure in the last several months has not been exercising on a consistent basis     Cognition   Overall Cognitive Status Within Functional Limits for tasks assessed     Observation/Other Assessments   Other Surveys  --  LLIS: 31% impairment     ROM / Strength   AROM / PROM / Strength AROM     AROM   AROM Assessment Site Shoulder   Right/Left Shoulder Right;Left   Right Shoulder Flexion 163 Degrees   Right Shoulder ABduction 160 Degrees   Right Shoulder Internal Rotation 54 Degrees   Right Shoulder External Rotation 77 Degrees   Left Shoulder Flexion 120 Degrees   Left Shoulder ABduction 101 Degrees   Left Shoulder Internal Rotation 20 Degrees   Left Shoulder External Rotation 90 Degrees           LYMPHEDEMA/ONCOLOGY QUESTIONNAIRE - 11/11/16 0959      Type   Cancer Type left DCIS breast cancer     Surgeries   Mastectomy Date 10/13/16   Sentinel Lymph Node Biopsy Date 10/13/16   Number Lymph Nodes Removed 2     What other symptoms do you have   Are you Having Heaviness or Tightness No  in left trunk   Are you having Pain Yes   Are you having pitting edema No   Is it Hard or Difficult finding clothes that fit No   Do you have infections No   Is there Decreased scar mobility No     Lymphedema Assessments   Lymphedema Assessments Upper extremities     Right Upper Extremity Lymphedema   15 cm Proximal to Olecranon Process 34.8 cm   Olecranon Process 28 cm   15 cm Proximal to Ulnar  Styloid Process 26.3 cm   Just Proximal to Ulnar Styloid Process 17.8 cm   Across Hand at [employer] 20.9 cm   At [place] of 2nd Digit 6.5 cm     Left Upper Extremity Lymphedema   15 cm Proximal to Olecranon Process 35.5 cm   Olecranon Process 27.9 cm   15 cm Proximal to Ulnar Styloid Process 25.6 cm   Just Proximal to Ulnar Styloid Process 17.1 cm  Across Hand at [employer] 20.4 cm   At [place] of 2nd Digit 6.4 cm           Katina Dung - 11/11/16 0001    Open a tight or new jar Mild difficulty   Do heavy household chores (wash walls, wash floors) Mild difficulty   Carry a shopping bag or briefcase No difficulty   Wash your back Mild difficulty   Use a knife to cut food Mild difficulty   Recreational activities in which you take some force or impact through your arm, shoulder, or hand (golf, hammering, tennis) Mild difficulty   During the past week, to what extent has your arm, shoulder or hand problem interfered with your normal social activities with family, friends, neighbors, or groups? Not at all   During the past week, to what extent has your arm, shoulder or hand problem limited your work or other regular daily activities Slightly   Arm, shoulder, or hand pain. Mild   Tingling (pins and needles) in your arm, shoulder, or hand None   Difficulty Sleeping No difficulty   DASH Score 15.91 %      Objective measurements completed on examination: See above findings.                  PT Education - 11/11/16 1209    Education provided Yes   Education Details anatomy and physiology of lymphatic system, lymphedema risk reduction practices   Person(s) Educated Patient   Methods Explanation   Comprehension Verbalized understanding                [facility] Goals - 11/11/16 1220      CC Long Term Goal  #1   Title Pt will be able to independently verbalize lymphedema risk reduction practices   Time 4   Period Weeks   Status New   Target Date  12/09/16     CC Long Term Goal  #2   Title Pt will be independent in a home exercise program for continued strengthening and stretching   Time 4   Period Weeks   Status New   Target Date 12/09/16     CC Long Term Goal  #3   Title Pt will report a 75% improvement in left lateral trunk swelling to allow improved comfort when left arm is at side   Time 4   Period Weeks   Status New   Target Date 12/09/16     CC Long Term Goal  #4   Title Pt will demonstrate 160 degrees of left shoulder abduction to allow pt to reach out to sides.   Baseline 101   Time 4   Period Weeks   Status New   Target Date 12/09/16     CC Long Term Goal  #5   Title Pt will demonstrate 160 degrees of left shoulder flexion to allow her to reach items overhead   Baseline 120   Time 4   Period Weeks   Status New             Plan - 11/11/16 1213    Clinical Impression Statement Pt presents to PT following a left skin sparing mastectomy and SLNB for treatment of left DCIS breast cancer. She still is not sure if she will need chemo or radiation. She currently presents with decreased left shoulder ROM and strength. She has tightness in her left axilla and pain in her upper abdomen just inferior to left breast  with LUE movements. She currently has expanders in place. She is also have some left lateral trunk swelling and would benefit from MLD to this area.    Clinical Presentation Evolving   Clinical Presentation due to: pt may require chemo and/or radiation   Clinical Decision Making Low   Rehab Potential Good   Clinical Impairments Affecting Rehab Potential pt may require radiation/chemotherapy   PT Frequency 2x / week   PT Duration 4 weeks   PT Treatment/Interventions ADLs/Self Care Home Management;Therapeutic exercise;Therapeutic activities;Patient/family education;Passive range of motion;Scar mobilization;Manual lymph drainage;Manual techniques;Taping   PT Next Visit Plan assist with prophylactic sleeve,  give info on ABC class, begin gently AA/A/PROM to left shoulder, assess compression bra for fit at left lateral trunk and do MLD here is time allows   Consulted and Agree with Plan of Care Patient      Patient will benefit from skilled therapeutic intervention in order to improve the following deficits and impairments:  Decreased knowledge of precautions, Increased edema, Decreased range of motion, Decreased strength, Postural dysfunction, Pain, Increased fascial restricitons  Visit Diagnosis: Stiffness of left shoulder, not elsewhere classified - Plan: PT plan of care cert/re-cert  Acute pain of left shoulder - Plan: PT plan of care cert/re-cert  Muscle weakness (generalized) - Plan: PT plan of care cert/re-cert  Localized edema - Plan: PT plan of care cert/re-cert     Problem List Patient Active Problem List   Diagnosis Date Noted  . DCIS (ductal carcinoma in situ) of breast 10/13/2016  . Genetic testing 08/10/2016  . Family history of breast cancer   . Family history of prostate cancer   . Family history of ovarian cancer   . Carcinoma of upper-inner quadrant of left breast in female, estrogen receptor positive ([place]) 07/28/2016  . Ductal carcinoma in situ (DCIS) of left breast 07/22/2016  . Cigarette smoker 05/20/2016  . 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 Dyspnea 06/24/2015  . Nodule of left lung 02/25/2015  . Hypertension 01/28/2015  . Hypothyroidism 01/28/2015  . GERD (gastroesophageal reflux disease) 01/28/2015  . Lung mass 01/07/2015    Allyson Sabal [facility] 11/11/2016, 12:25 PM  [place], [place], [address] Phone: [phone]   Fax:  [phone]  Name: Taylor Walsh MRN: [id number] Date of Birth: [birth year]  Manus Gunning, PT 11/11/16 12:26 PM

## 2016-11-12 ENCOUNTER — Other Ambulatory Visit: Payer: Self-pay | Admitting: General Surgery

## 2016-11-13 ENCOUNTER — Other Ambulatory Visit: Payer: Self-pay | Admitting: Hematology

## 2016-11-13 ENCOUNTER — Telehealth: Payer: Self-pay | Admitting: Hematology

## 2016-11-13 DIAGNOSIS — Z170 Estrogen receptor positive status [ER+]: Principal | ICD-10-CM

## 2016-11-13 DIAGNOSIS — C50212 Malignant neoplasm of upper-inner quadrant of left female breast: Secondary | ICD-10-CM

## 2016-11-13 NOTE — Telephone Encounter (Signed)
Scheduled treatment per 8/15 los - left message with appt date and time and for patient to pick up new schedule next visit.

## 2016-11-17 ENCOUNTER — Encounter (INDEPENDENT_AMBULATORY_CARE_PROVIDER_SITE_OTHER): Payer: Self-pay

## 2016-11-17 ENCOUNTER — Ambulatory Visit (HOSPITAL_COMMUNITY)
Admission: RE | Admit: 2016-11-17 | Discharge: 2016-11-17 | Disposition: A | Discharge status: Home / Self Care | Payer: BC Managed Care – PPO | Source: Ambulatory Visit | Attending: Hematology | Admitting: Hematology

## 2016-11-17 ENCOUNTER — Encounter: Payer: Self-pay | Admitting: Physical Therapy

## 2016-11-17 ENCOUNTER — Ambulatory Visit: Payer: BC Managed Care – PPO | Admitting: Physical Therapy

## 2016-11-17 DIAGNOSIS — I351 Nonrheumatic aortic (valve) insufficiency: Secondary | ICD-10-CM | POA: Diagnosis not present

## 2016-11-17 DIAGNOSIS — M25612 Stiffness of left shoulder, not elsewhere classified: Secondary | ICD-10-CM

## 2016-11-17 DIAGNOSIS — I10 Essential (primary) hypertension: Secondary | ICD-10-CM | POA: Diagnosis present

## 2016-11-17 DIAGNOSIS — C50212 Malignant neoplasm of upper-inner quadrant of left female breast: Secondary | ICD-10-CM

## 2016-11-17 DIAGNOSIS — C50919 Malignant neoplasm of unspecified site of unspecified female breast: Secondary | ICD-10-CM | POA: Insufficient documentation

## 2016-11-17 DIAGNOSIS — Z170 Estrogen receptor positive status [ER+]: Secondary | ICD-10-CM

## 2016-11-17 DIAGNOSIS — M25512 Pain in left shoulder: Secondary | ICD-10-CM

## 2016-11-17 NOTE — Progress Notes (Signed)
  Echocardiogram 2D Echocardiogram has been performed.  Taylor Walsh 11/17/2016, 11:04 AM

## 2016-11-17 NOTE — Patient Instructions (Signed)
Shoulder: Flexion (Supine)    With hands shoulder width apart, slowly lower dowel to floor behind head. Do not let elbows bend. Keep back flat. Hold _10___ seconds. Repeat _10___ times. Do __2__ sessions per day. CAUTION: Stretch slowly and gently.  Copyright  VHI. All rights reserved.  Shoulder: Abduction (Supine)    With right arm flat on floor, hold dowel in palm. Slowly move arm up to side of head by pushing with opposite arm. Do not let elbow bend. Repeat on left. Hold _10___ seconds. Repeat _10___ times. Do __2__ sessions per day. CAUTION: Stretch slowly and gently.  Copyright  VHI. All rights reserved.

## 2016-11-17 NOTE — Therapy (Signed)
[place], [place], [address] Phone: [phone]   Fax:  [phone]  Physical Therapy Treatment  Patient Details  Name: Taylor Walsh MRN: [id number] Date of Birth: [birth year] Referring Provider: Thimmappa  Encounter Date: 11/17/2016      PT End of Session - 11/17/16 1750    Visit Number 2   Number of Visits 9   Date for PT Re-Evaluation 12/09/16   PT Start Time 1761   PT Stop Time 1349   PT Time Calculation (min) 46 min   Activity Tolerance Patient tolerated treatment well   Behavior During Therapy [facility] for tasks assessed/performed      Past Medical History:  Diagnosis Date  . Anxiety   . Arthritis   . Cancer ([place]) 10/08/2016   left breast cancer  . Dyslipidemia   . Family history of breast cancer   . Family history of ovarian cancer   . Family history of prostate cancer   . GERD (gastroesophageal reflux disease)    nexium if needed  . Heart murmur   . Hypertension   . Hypothyroid   . Osteopenia   . Pneumonia 10/08/2016   June 2013    Past Surgical History:  Procedure Laterality Date  . ABDOMINAL HYSTERECTOMY    . BREAST RECONSTRUCTION WITH PLACEMENT OF TISSUE EXPANDER AND FLEX HD (ACELLULAR HYDRATED DERMIS) Left 10/13/2016   Procedure: LEFT BREAST RECONSTRUCTION WITH PLACEMENT OF TISSUE EXPANDER AND ALLODERM;  Surgeon: Irene Limbo, MD;  Location: [place];  Service: Plastics;  Laterality: Left;  . FOOT SURGERY     bi-lat/ repaired hammer toes  . MASTECTOMY W/ SENTINEL NODE BIOPSY Left 10/13/2016  . MASTECTOMY W/ SENTINEL NODE BIOPSY Left 10/13/2016   Procedure: LEFT TOTAL MASTECTOMY WITH LEFT AXILLARY SENTINEL LYMPH NODE BIOPSY;  Surgeon: Coralie Keens, MD;  Location: [place];  Service: General;  Laterality: Left;  . TUBAL LIGATION    . VIDEO ASSISTED THORACOSCOPY (VATS)/WEDGE RESECTION Left 02/25/2015   Procedure: VIDEO ASSISTED THORACOSCOPY (VATS)/ LUL WEDGE RESECTION with  ON Q placement.;  Surgeon: Melrose Nakayama, MD;  Location: [place];  Service: Thoracic;  Laterality: Left;    There were no vitals filed for this visit.      Subjective Assessment - 11/17/16 1307    Subjective In the last couple of days I have been able to move around freely without hurting. I don't have to get any more saline so I think that is helping. It feels a little puffy right up under my left arm.    Pertinent History Presented following screening MMG with new left breast calcifications. 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 MRI showed mass and NME spanning 10.2 cm from anterior to posterior, including both biopsied lesions. No abnormal or enlarged LN, and no evidence of malignancy in the right breast. Final pathology mastectomy 0.3 cm IDC, 5.5 cm high grade DCIS focally 0.1 cm from medial margin, 2/2 SLN +, +LVI. 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 No/denies   Pain Score 0-No pain            [facility] PT  Assessment - 11/17/16 0001      AROM   Left Shoulder Flexion 147 Degrees  after PROM   Left Shoulder ABduction 127 Degrees  after PROM                     [facility] Adult PT Treatment/Exercise - 11/17/16 0001      Shoulder Exercises: Supine   Flexion AAROM;Both;5 reps  with dowel with 10 sec hold   ABduction AAROM;Both;5 reps  with dowel with 10 sec holds     Manual Therapy   Manual Therapy Passive ROM   Passive ROM to left shoulder in direction of flexion and abduction                PT Education - 11/17/16 1315    Education provided Yes   Education Details ABC class    Person(s) Educated Patient    Methods Explanation;Handout   Comprehension Verbalized understanding                [facility] Goals - 11/11/16 1220      CC Long Term Goal  #1   Title Pt will be able to independently verbalize lymphedema risk reduction practices   Time 4   Period Weeks   Status New   Target Date 12/09/16     CC Long Term Goal  #2   Title Pt will be independent in a home exercise program for continued strengthening and stretching   Time 4   Period Weeks   Status New   Target Date 12/09/16     CC Long Term Goal  #3   Title Pt will report a 75% improvement in left lateral trunk swelling to allow improved comfort when left arm is at side   Time 4   Period Weeks   Status New   Target Date 12/09/16     CC Long Term Goal  #4   Title Pt will demonstrate 160 degrees of left shoulder abduction to allow pt to reach out to sides.   Baseline 101   Time 4   Period Weeks   Status New   Target Date 12/09/16     CC Long Term Goal  #5   Title Pt will demonstrate 160 degrees of left shoulder flexion to allow her to reach items overhead   Baseline 120   Time 4   Period Weeks   Status New            Plan - 11/17/16 1751    Clinical Impression Statement Instructed pt in supine dowel exercises today to help increase shoulder ROM. Also performed PROM to left shoulder. Her ROM was significantly improved (over 20 degrees) in direction of flexion and abduction following PROM.    Rehab Potential Good   Clinical Impairments Affecting Rehab Potential pt may require radiation/chemotherapy   PT Frequency 2x / week   PT Duration 4 weeks   PT Treatment/Interventions ADLs/Self Care Home Management;Therapeutic exercise;Therapeutic activities;Patient/family education;Passive range of motion;Scar mobilization;Manual lymph drainage;Manual techniques;Taping   PT Next Visit Plan assist with prophylactic sleeve, continue gentle AA/A/PROM to left shoulder, do MLD here is time allows   Consulted and  Agree with Plan of Care Patient      Patient will benefit from skilled therapeutic intervention in order to improve the following deficits and impairments:  Decreased knowledge of precautions, Increased edema, Decreased range of motion, Decreased strength, Postural dysfunction, Pain, Increased fascial restricitons  Visit Diagnosis: Stiffness of left shoulder,  not elsewhere classified  Acute pain of left shoulder     Problem List Patient Active Problem List   Diagnosis Date Noted  . DCIS (ductal carcinoma in situ) of breast 10/13/2016  . Genetic testing 08/10/2016  . Family history of breast cancer   . Family history of prostate cancer   . Family history of ovarian cancer   . Breast cancer of upper-inner quadrant of left female breast ([place]) 07/22/2016  . Cigarette smoker 05/20/2016  . 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 Dyspnea 06/24/2015  . Nodule of left lung 02/25/2015  . Hypertension 01/28/2015  . Hypothyroidism 01/28/2015  . GERD (gastroesophageal reflux disease) 01/28/2015  . Lung mass 01/07/2015    Allyson Sabal [facility] 11/17/2016, 5:53 PM  [place], [place], [address] Phone: [phone]   Fax:  [phone]  Name: Taylor Walsh MRN: [id number] Date of Birth: 06-29-1955  Manus Gunning, PT 11/17/16 5:53 PM

## 2016-11-18 ENCOUNTER — Ambulatory Visit (HOSPITAL_COMMUNITY)
Admission: RE | Admit: 2016-11-18 | Discharge: 2016-11-18 | Disposition: A | Discharge status: Home / Self Care | Payer: BC Managed Care – PPO | Source: Ambulatory Visit | Attending: Hematology | Admitting: Hematology

## 2016-11-18 ENCOUNTER — Encounter: Payer: Self-pay | Admitting: *Deleted

## 2016-11-18 ENCOUNTER — Encounter (HOSPITAL_COMMUNITY): Payer: Self-pay

## 2016-11-18 DIAGNOSIS — C50919 Malignant neoplasm of unspecified site of unspecified female breast: Secondary | ICD-10-CM | POA: Diagnosis not present

## 2016-11-18 DIAGNOSIS — Z170 Estrogen receptor positive status [ER+]: Principal | ICD-10-CM

## 2016-11-18 DIAGNOSIS — C50212 Malignant neoplasm of upper-inner quadrant of left female breast: Secondary | ICD-10-CM

## 2016-11-18 HISTORY — DX: Type 2 diabetes mellitus without complications: E11.9

## 2016-11-18 MED ORDER — IOPAMIDOL (ISOVUE-300) INJECTION 61%
INTRAVENOUS | Status: AC
  Administered 2016-11-18: 100 mL
  Filled 2016-11-18: qty 100

## 2016-11-19 ENCOUNTER — Ambulatory Visit: Payer: BC Managed Care – PPO | Admitting: Hematology

## 2016-11-19 ENCOUNTER — Ambulatory Visit: Payer: BC Managed Care – PPO | Admitting: Physical Therapy

## 2016-11-19 ENCOUNTER — Encounter: Payer: Self-pay | Admitting: *Deleted

## 2016-11-19 ENCOUNTER — Other Ambulatory Visit: Payer: BC Managed Care – PPO

## 2016-11-19 DIAGNOSIS — M25512 Pain in left shoulder: Secondary | ICD-10-CM

## 2016-11-19 DIAGNOSIS — M25612 Stiffness of left shoulder, not elsewhere classified: Secondary | ICD-10-CM

## 2016-11-19 NOTE — Therapy (Signed)
[place], [place], [address] Phone: [phone]   Fax:  [phone]  Physical Therapy Treatment  Patient Details  Name: Taylor Walsh MRN: [id number] Date of Birth: 08/13/1955 Referring Provider: Thimmappa  Encounter Date: 11/19/2016      PT End of Session - 11/19/16 1727    Visit Number 3   Number of Visits 9   Date for PT Re-Evaluation 12/09/16   PT Start Time 2947  pt arrived late   PT Stop Time 1349   PT Time Calculation (min) 36 min   Activity Tolerance Patient tolerated treatment well   Behavior During Therapy WFL for tasks assessed/performed      Past Medical History:  Diagnosis Date  . Anxiety   . Arthritis   . Cancer ([place]) 10/08/2016   left breast cancer  . Diabetes mellitus without complication ([place])   . Dyslipidemia   . Family history of breast cancer   . Family history of ovarian cancer   . Family history of prostate cancer   . GERD (gastroesophageal reflux disease)    nexium if needed  . Heart murmur   . Hypertension   . Hypothyroid   . Osteopenia   . Pneumonia 10/08/2016   June 2013    Past Surgical History:  Procedure Laterality Date  . ABDOMINAL HYSTERECTOMY    . BREAST RECONSTRUCTION WITH PLACEMENT OF TISSUE EXPANDER AND FLEX HD (ACELLULAR HYDRATED DERMIS) Left 10/13/2016   Procedure: LEFT BREAST RECONSTRUCTION WITH PLACEMENT OF TISSUE EXPANDER AND ALLODERM;  Surgeon: Irene Limbo, MD;  Location: [place];  Service: Plastics;  Laterality: Left;  . FOOT SURGERY     bi-lat/ repaired hammer toes  . MASTECTOMY W/ SENTINEL NODE BIOPSY Left 10/13/2016  . MASTECTOMY W/ SENTINEL NODE BIOPSY Left 10/13/2016   Procedure: LEFT TOTAL MASTECTOMY WITH LEFT AXILLARY SENTINEL LYMPH NODE BIOPSY;  Surgeon: Coralie Keens, MD;  Location: [place];  Service: General;  Laterality: Left;  . TUBAL LIGATION    . VIDEO ASSISTED THORACOSCOPY (VATS)/WEDGE RESECTION Left 02/25/2015   Procedure: VIDEO ASSISTED THORACOSCOPY (VATS)/ LUL WEDGE RESECTION with ON Q placement.;  Surgeon: Melrose Nakayama, MD;  Location: [place];  Service: Thoracic;  Laterality: Left;    There were no vitals filed for this visit.      Subjective Assessment - 11/19/16 1314    Subjective The tightness in my armpit feels like it is going away a little bit. I think my shoulder is getting better.    Pertinent History Presented following screening MMG with new left breast calcifications. 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 MRI showed mass and NME spanning 10.2 cm from anterior to posterior, including both biopsied lesions. No abnormal or enlarged LN, and no evidence of malignancy in the right breast. Final pathology mastectomy 0.3 cm IDC, 5.5 cm high grade DCIS focally 0.1 cm from medial margin, 2/2 SLN +, +LVI. 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 No/denies   Pain Score 0-No pain  [place] Adult PT Treatment/Exercise - 11/19/16 0001      Manual Therapy   Manual Therapy Passive ROM   Passive ROM to left shoulder in direction of flexion, horizontal abduction, and abduction                        Long Term Clinic Goals - 11/11/16 1220      CC Long Term Goal  #1   Title Pt will be able to independently verbalize lymphedema risk reduction practices   Time 4   Period Weeks   Status New   Target Date 12/09/16     CC Long Term Goal  #2   Title Pt will be independent in a home exercise program for continued strengthening and stretching   Time 4   Period Weeks   Status New   Target Date  12/09/16     CC Long Term Goal  #3   Title Pt will report a 75% improvement in left lateral trunk swelling to allow improved comfort when left arm is at side   Time 4   Period Weeks   Status New   Target Date 12/09/16     CC Long Term Goal  #4   Title Pt will demonstrate 160 degrees of left shoulder abduction to allow pt to reach out to sides.   Baseline 101   Time 4   Period Weeks   Status New   Target Date 12/09/16     CC Long Term Goal  #5   Title Pt will demonstrate 160 degrees of left shoulder flexion to allow her to reach items overhead   Baseline 120   Time 4   Period Weeks   Status New            Plan - 11/19/16 1728    Clinical Impression Statement Focued today on PROM to left shoulder to help improve function. Pt gained signficant PROM from beginning of session to end. Pt is having port placed soon and was worried about loosing ROM of RUE and wanted to be sure L UE was moving better.    Rehab Potential Good   Clinical Impairments Affecting Rehab Potential pt may require radiation/chemotherapy   PT Frequency 2x / week   PT Duration 4 weeks   PT Treatment/Interventions ADLs/Self Care Home Management;Therapeutic exercise;Therapeutic activities;Patient/family education;Passive range of motion;Scar mobilization;Manual lymph drainage;Manual techniques;Taping   PT Next Visit Plan pulleys, ball, continue gentle AA/A/PROM to left shoulder, do MLD here is time allows   Consulted and Agree with Plan of Care Patient      Patient will benefit from skilled therapeutic intervention in order to improve the following deficits and impairments:  Decreased knowledge of precautions, Increased edema, Decreased range of motion, Decreased strength, Postural dysfunction, Pain, Increased fascial restricitons  Visit Diagnosis: Stiffness of left shoulder, not elsewhere classified  Acute pain of left shoulder     Problem List Patient Active Problem List   Diagnosis Date Noted  .  DCIS (ductal carcinoma in situ) of breast 10/13/2016  . Genetic testing 08/10/2016  . Family history of breast cancer   . Family history of prostate cancer   . Family history of ovarian cancer   . Breast cancer of upper-inner quadrant of left female breast ([place]) 07/22/2016  . Cigarette smoker 05/20/2016  . 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 Dyspnea 06/24/2015  . Nodule of left lung 02/25/2015  . Hypertension 01/28/2015  . Hypothyroidism 01/28/2015  . GERD (  gastroesophageal reflux disease) 01/28/2015  . Lung mass 01/07/2015    Allyson Sabal [facility] 11/19/2016, 5:30 PM  [place], [place], [address] Phone: [phone]   Fax:  [phone]  Name: ARWEN HASELEY MRN: [id number] Date of Birth: [birth year]  Manus Gunning, PT 11/19/16 5:30 PM

## 2016-11-20 ENCOUNTER — Other Ambulatory Visit: Payer: Self-pay | Admitting: Hematology

## 2016-11-20 ENCOUNTER — Encounter (HOSPITAL_BASED_OUTPATIENT_CLINIC_OR_DEPARTMENT_OTHER): Payer: Self-pay | Admitting: *Deleted

## 2016-11-20 DIAGNOSIS — Z170 Estrogen receptor positive status [ER+]: Principal | ICD-10-CM

## 2016-11-20 DIAGNOSIS — C50212 Malignant neoplasm of upper-inner quadrant of left female breast: Secondary | ICD-10-CM

## 2016-11-20 MED ORDER — PROCHLORPERAZINE MALEATE 10 MG PO TABS: 10.0000 mg | ORAL_TABLET | Freq: Four times a day (QID) | ORAL | 1 refills | Status: DC | PRN

## 2016-11-20 MED ORDER — ONDANSETRON HCL 8 MG PO TABS: 8.0000 mg | ORAL_TABLET | Freq: Two times a day (BID) | ORAL | 1 refills | Status: DC | PRN

## 2016-11-20 MED ORDER — LIDOCAINE-PRILOCAINE 2.5-2.5 % EX CREA: TOPICAL_CREAM | CUTANEOUS | 3 refills | Status: DC

## 2016-11-20 NOTE — Progress Notes (Signed)
START ON PATHWAY REGIMEN - Breast   Doxorubicin + Cyclophosphamide [facility]):   A cycle is every 21 days:     Doxorubicin      Cyclophosphamide   **Always confirm dose/schedule in your pharmacy ordering system**    Paclitaxel 80 mg/m2 Weekly:   Administer weekly:     Paclitaxel   **Always confirm dose/schedule in your pharmacy ordering system**    Patient Characteristics: Postoperative without Neoadjuvant Therapy (Pathologic Staging), Invasive Disease, Adjuvant Therapy, HER2 Negative/Unknown/Equivocal, ER Positive, Node Positive, Node Positive (1-3), MammaPrint(R) Ordered, High Genomic Risk Therapeutic Status: Postoperative without Neoadjuvant Therapy (Pathologic Staging) AJCC Grade: G2 AJCC N Category: pN1a AJCC M Category: cM0 ER Status: Positive (+) AJCC 8 Stage Grouping: IA HER2 Status: Negative (-) Oncotype Dx Recurrence Score: Not Appropriate AJCC T Category: pT1a PR Status: Positive (+) Has this patient completed genomic testing? Yes - MammaPrint(R) MammaPrint(R) Score: High Genomic Risk Intent of Therapy: Curative Intent, Discussed with Patient

## 2016-11-22 NOTE — H&P (Addendum)
Taylor Walsh Location: [facility] Patient #: [id number] DOB: 1955-12-26 Divorced / Language: English / Race: Black or African American Female       History of Present Illness       The patient is a 61 year old female presenting for a post-operative visit. This is a 61 year old female who returns for her first postop visit following left mastectomy and reconstruction. I was scheduled to do her surgery but because of temporary medical leave Dr. Ninfa Linden formed surgery     On October 13, 2016 she underwent left total mastectomy, sentinel lymph node biopsy, and placement of tissue expander. 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 There was a close medial margin. Additional mastectomy skin flap resection showed no cancer. Positive LV. 2 out of 2 lymph nodes showed metastatic cancer. ER 95%. PR 45%. Ki-67 15%. HER-2 negative.     She has seen Dr. Iran Planas One of the 2 drains has been removed. She is scheduled to see Dr. Burr Medico on August 6. She does not have an appointment with Dr. Isidore Moos, but I think that is advisable due to the positive nodes. I discussed her pathology report with her. I discussed the rationale for adjuvant therapies in general but told her Dr. Burr Medico would make final decisions. She has recommended  chemotherapy and I discussed Port-A-Cath insertion with her.  I discussed antiestrogen therapy with her and told her that Dr. Burr Medico would go into that with her in detail. Our nursing staff will contact the cancer center to arrange follow-up with Dr. Isidore Moos for her opinion.  Exam today was good. She will see me in one month I will plan Port-A-Cath insertion  Eventual referral to physical therapy Await opinions from Dr. Burr Medico and Dr. Isidore Moos.   Allergies  Vicodin ES *ANALGESICS - OPIOID*   Medication History Calcium+D3 (600-800MG-UNIT Tablet, Oral) Active. Multiple Vitamins/Minerals (Oral)  Active. Atorvastatin Calcium (40MG Tablet, Oral) Active. Levothyroxine Sodium (88MCG Tablet, Oral) Active. ValACYclovir HCl (1GM Tablet, Oral) Active. Valsartan (160MG Tablet, Oral) Active. Aspirin (81MG Tablet DR, Oral) Active. Robaxin (500MG Tablet, Oral) Active. Olmesartan Medoxomil (20MG Tablet, Oral) Active. Medications Reconciled  Vitals  Weight: 202 lb Height: 63in Body Surface Area: 1.94 m Body Mass Index: 35.78 kg/m  Temp.: 98.45F  BP: 130/80 (Sitting, Left Arm, Standard)   Physical Exam  General Note: Alert. A little bit emotional about her diagnosis but in no distress in mental status is normal. Female friend is with her throughout the encounter   Breast Note: Left mastectomy has been performed through a generous circumareolar incision which extends inferiorly. All skin flaps are healthy. No hematoma or infection. Single drainage is serosanguineous. She can abduct her arm about 90. A little bit tight otherwise. No sensory deficit under the arm. No swelling noted.  Lungs: clear to auscultatiuon CV:  RRR. No murmer or ectopy. Pulses intact Neck: no adenopathy. trachea midline.     Assessment & Plan PRIMARY CANCER OF UPPER INNER QUADRANT OF LEFT FEMALE BREAST (C50.212).  Dr. Ninfa Linden and Dr. Iran Planas performed your left total mastectomy, sentinel lymph node biopsy, and tissue expander insertion on October 13, 2016 The wounds look good and you appear to be healing well      Once Dr. Iran Planas removes the other drain and approves, we will then refer you to physical therapy for range of motion exercises of her left shoulder      We reviewed the pathology report which  shows a small invasive cancer and a large area of in situ cancer. 2 lymph nodes had cancer There does not appear to be any need for any further surgery.  Dr. Burr Medico  has decided that you need chemotherapy and a Port-A-Cath insertion I am referring you back to Dr. Eppie Gibson to decide  whether or not you need radiation therapy      you will be scheduled for port a cath insertion, as we discussed in detail.      In addition return to see Dr. Dalbert Batman in 1 month  FAMILY HISTORY OF OVARIAN CANCER (Z80.41) FAMILY HISTORY OF BREAST CANCER IN SISTER (Z80.3) FAMILY HISTORY OF PROSTATE CANCER (Z80.42) HISTORY OF TOTAL ABDOMINAL HYSTERECTOMY (Z90.710) BMI 34.0-34.9,ADULT (Z68.34) PULMONARY AMYLOIDOSIS (E85.4) TOBACCO ABUSE (Z72.0) HYPERTENSION, BENIGN (I10)    Corleone Biegler M. Dalbert Batman, M.D., [facility] General and Minimally invasive Surgery Breast and Colorectal Surgery Office:   [phone] Pager:   [phone]

## 2016-11-23 ENCOUNTER — Ambulatory Visit (HOSPITAL_COMMUNITY)
Admission: RE | Admit: 2016-11-23 | Discharge: 2016-11-23 | Disposition: A | Discharge status: Home / Self Care | Payer: BC Managed Care – PPO | Source: Ambulatory Visit | Attending: Hematology | Admitting: Hematology

## 2016-11-23 ENCOUNTER — Encounter (HOSPITAL_COMMUNITY)
Admission: RE | Admit: 2016-11-23 | Discharge: 2016-11-23 | Disposition: A | Discharge status: Home / Self Care | Payer: BC Managed Care – PPO | Source: Ambulatory Visit | Attending: Hematology | Admitting: Hematology

## 2016-11-23 ENCOUNTER — Ambulatory Visit: Payer: BC Managed Care – PPO | Admitting: Physical Therapy

## 2016-11-23 ENCOUNTER — Encounter: Payer: Self-pay | Admitting: Physical Therapy

## 2016-11-23 DIAGNOSIS — Z170 Estrogen receptor positive status [ER+]: Secondary | ICD-10-CM | POA: Insufficient documentation

## 2016-11-23 DIAGNOSIS — M25612 Stiffness of left shoulder, not elsewhere classified: Secondary | ICD-10-CM | POA: Diagnosis not present

## 2016-11-23 DIAGNOSIS — M6281 Muscle weakness (generalized): Secondary | ICD-10-CM

## 2016-11-23 DIAGNOSIS — C50212 Malignant neoplasm of upper-inner quadrant of left female breast: Secondary | ICD-10-CM | POA: Insufficient documentation

## 2016-11-23 DIAGNOSIS — M25512 Pain in left shoulder: Secondary | ICD-10-CM

## 2016-11-23 MED ORDER — TECHNETIUM TC 99M MEDRONATE IV KIT
25.0000 | PACK | Freq: Once | INTRAVENOUS | Status: AC | PRN
  Administered 2016-11-23: 19 via INTRAVENOUS

## 2016-11-23 NOTE — Therapy (Signed)
[place], [place], [address] Phone: [phone]   Fax:  [phone]  Physical Therapy Treatment  Patient Details  Name: Taylor Walsh MRN: [id number] Date of Birth: March 06, 1956 Referring Provider: Thimmappa  Encounter Date: 11/23/2016      PT End of Session - 11/23/16 1215    Visit Number 4   Number of Visits 9   Date for PT Re-Evaluation 12/09/16   PT Start Time 0930   PT Stop Time 1016   PT Time Calculation (min) 46 min   Activity Tolerance Patient tolerated treatment well   Behavior During Therapy [facility] for tasks assessed/performed      Past Medical History:  Diagnosis Date  . Anxiety   . Arthritis   . Cancer ([place]) 10/08/2016   left breast cancer  . Diabetes mellitus without complication ([place])   . Dyslipidemia   . Family history of breast cancer   . Family history of ovarian cancer   . Family history of prostate cancer   . GERD (gastroesophageal reflux disease)    nexium if needed  . Heart murmur   . Hypertension   . Hypothyroid   . Osteopenia   . Pneumonia 10/08/2016   June 2013    Past Surgical History:  Procedure Laterality Date  . ABDOMINAL HYSTERECTOMY    . BREAST RECONSTRUCTION WITH PLACEMENT OF TISSUE EXPANDER AND FLEX HD (ACELLULAR HYDRATED DERMIS) Left 10/13/2016   Procedure: LEFT BREAST RECONSTRUCTION WITH PLACEMENT OF TISSUE EXPANDER AND ALLODERM;  Surgeon: Irene Limbo, MD;  Location: [place];  Service: Plastics;  Laterality: Left;  . FOOT SURGERY     bi-lat/ repaired hammer toes  . MASTECTOMY W/ SENTINEL NODE BIOPSY Left 10/13/2016  . MASTECTOMY W/ SENTINEL NODE BIOPSY Left 10/13/2016   Procedure: LEFT TOTAL MASTECTOMY WITH LEFT AXILLARY SENTINEL LYMPH NODE BIOPSY;  Surgeon: Coralie Keens, MD;  Location: [place];  Service: General;  Laterality: Left;  . TUBAL LIGATION    . VIDEO ASSISTED THORACOSCOPY (VATS)/WEDGE RESECTION Left 02/25/2015   Procedure: VIDEO  ASSISTED THORACOSCOPY (VATS)/ LUL WEDGE RESECTION with ON Q placement.;  Surgeon: Melrose Nakayama, MD;  Location: [place];  Service: Thoracic;  Laterality: Left;    There were no vitals filed for this visit.      Subjective Assessment - 11/23/16 0932    Subjective My arm feels pretty good today. I can tell a little more everyday that I can do a little more with it as far as lifting it up higher. I can see under my arm now and I couldn't before. I have been trying to do my exercises with the stick.    Pertinent History Presented following screening MMG with new left breast calcifications. 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 MRI showed mass and NME spanning 10.2 cm from anterior to posterior, including both biopsied lesions. No abnormal or enlarged LN, and no evidence of malignancy in the right breast. Final pathology mastectomy 0.3 cm IDC, 5.5 cm high grade DCIS focally 0.1 cm from medial margin, 2/2 SLN +, +LVI. 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 No/denies  Pain Score 0-No pain                         [facility] Adult PT Treatment/Exercise - 11/23/16 0001      Shoulder Exercises: Pulleys   Flexion 2 minutes   ABduction 2 minutes     Shoulder Exercises: Therapy Ball   Flexion 10 reps  with hold at end of stretc   ABduction 10 reps  on L with hold at end of stretch     Manual Therapy   Manual Therapy Passive ROM   Passive ROM to left shoulder in direction of flexion, horizontal abduction, and abduction                        Long Term Clinic Goals - 11/11/16 1220      CC Long Term Goal  #1   Title  Pt will be able to independently verbalize lymphedema risk reduction practices   Time 4   Period Weeks   Status New   Target Date 12/09/16     CC Long Term Goal  #2   Title Pt will be independent in a home exercise program for continued strengthening and stretching   Time 4   Period Weeks   Status New   Target Date 12/09/16     CC Long Term Goal  #3   Title Pt will report a 75% improvement in left lateral trunk swelling to allow improved comfort when left arm is at side   Time 4   Period Weeks   Status New   Target Date 12/09/16     CC Long Term Goal  #4   Title Pt will demonstrate 160 degrees of left shoulder abduction to allow pt to reach out to sides.   Baseline 101   Time 4   Period Weeks   Status New   Target Date 12/09/16     CC Long Term Goal  #5   Title Pt will demonstrate 160 degrees of left shoulder flexion to allow her to reach items overhead   Baseline 120   Time 4   Period Weeks   Status New            Plan - 11/23/16 1216    Clinical Impression Statement Pt states her arm is moving better now and she is now able to see her armpit when she raises her arm. Began gentle AAROM exercises today and pt felt a good stretch with these. Continued with PROM to help improve L shoulder ROM. Pt is going to have port placed tomorrow and will be beginning chemotherapy.    Rehab Potential Good   Clinical Impairments Affecting Rehab Potential pt may require radiation/chemotherapy   PT Frequency 2x / week   PT Duration 4 weeks   PT Treatment/Interventions ADLs/Self Care Home Management;Therapeutic exercise;Therapeutic activities;Patient/family education;Passive range of motion;Scar mobilization;Manual lymph drainage;Manual techniques;Taping   PT Next Visit Plan assess goals, pulleys, ball, continue gentle AA/A/PROM to left shoulder, do MLD here is time allows   Consulted and Agree with Plan of Care Patient      Patient will benefit from skilled therapeutic intervention  in order to improve the following deficits and impairments:  Decreased knowledge of precautions, Increased edema, Decreased range of motion, Decreased strength, Postural dysfunction, Pain, Increased fascial restricitons  Visit Diagnosis: Stiffness of left shoulder, not elsewhere classified  Acute pain of left shoulder  Muscle weakness (generalized)  Problem List Patient Active Problem List   Diagnosis Date Noted  . DCIS (ductal carcinoma in situ) of breast 10/13/2016  . Genetic testing 08/10/2016  . Family history of breast cancer   . Family history of prostate cancer   . Family history of ovarian cancer   . Breast cancer of upper-inner quadrant of left female breast ([place]) 07/22/2016  . Cigarette smoker 05/20/2016  . 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 Dyspnea 06/24/2015  . Nodule of left lung 02/25/2015  . Hypertension 01/28/2015  . Hypothyroidism 01/28/2015  . GERD (gastroesophageal reflux disease) 01/28/2015  . Lung mass 01/07/2015    Allyson Sabal [facility] 11/23/2016, 12:18 PM  [place], [place], [address] Phone: [phone]   Fax:  [phone]  Name: Taylor Walsh MRN: [id number] Date of Birth: Jan 04, 1956  Manus Gunning, PT 11/23/16 12:18 PM

## 2016-11-23 NOTE — Progress Notes (Signed)
Ensure pre surgery drink given with instructions to complete by 0830 dos, pt verbalized understanding. 

## 2016-11-24 ENCOUNTER — Ambulatory Visit (HOSPITAL_BASED_OUTPATIENT_CLINIC_OR_DEPARTMENT_OTHER): Payer: BC Managed Care – PPO | Admitting: Anesthesiology

## 2016-11-24 ENCOUNTER — Encounter (HOSPITAL_BASED_OUTPATIENT_CLINIC_OR_DEPARTMENT_OTHER)
Admission: RE | Disposition: A | Discharge status: Home / Self Care | Payer: Self-pay | Source: Ambulatory Visit | Attending: General Surgery

## 2016-11-24 ENCOUNTER — Ambulatory Visit (HOSPITAL_BASED_OUTPATIENT_CLINIC_OR_DEPARTMENT_OTHER)
Admission: RE | Admit: 2016-11-24 | Discharge: 2016-11-24 | Disposition: A | Discharge status: Home / Self Care | Payer: BC Managed Care – PPO | Source: Ambulatory Visit | Attending: General Surgery | Admitting: General Surgery

## 2016-11-24 ENCOUNTER — Ambulatory Visit (HOSPITAL_COMMUNITY): Payer: BC Managed Care – PPO

## 2016-11-24 ENCOUNTER — Encounter: Payer: BC Managed Care – PPO | Admitting: Physical Therapy

## 2016-11-24 ENCOUNTER — Encounter (HOSPITAL_BASED_OUTPATIENT_CLINIC_OR_DEPARTMENT_OTHER): Payer: Self-pay | Admitting: Anesthesiology

## 2016-11-24 DIAGNOSIS — Z6835 Body mass index (BMI) 35.0-35.9, adult: Secondary | ICD-10-CM | POA: Diagnosis not present

## 2016-11-24 DIAGNOSIS — Z419 Encounter for procedure for purposes other than remedying health state, unspecified: Secondary | ICD-10-CM

## 2016-11-24 DIAGNOSIS — I10 Essential (primary) hypertension: Secondary | ICD-10-CM | POA: Diagnosis not present

## 2016-11-24 DIAGNOSIS — Z8041 Family history of malignant neoplasm of ovary: Secondary | ICD-10-CM | POA: Insufficient documentation

## 2016-11-24 DIAGNOSIS — E039 Hypothyroidism, unspecified: Secondary | ICD-10-CM | POA: Insufficient documentation

## 2016-11-24 DIAGNOSIS — Z9012 Acquired absence of left breast and nipple: Secondary | ICD-10-CM | POA: Insufficient documentation

## 2016-11-24 DIAGNOSIS — C50212 Malignant neoplasm of upper-inner quadrant of left female breast: Secondary | ICD-10-CM

## 2016-11-24 DIAGNOSIS — Z170 Estrogen receptor positive status [ER+]: Secondary | ICD-10-CM | POA: Insufficient documentation

## 2016-11-24 DIAGNOSIS — C50912 Malignant neoplasm of unspecified site of left female breast: Secondary | ICD-10-CM | POA: Diagnosis not present

## 2016-11-24 DIAGNOSIS — Z79899 Other long term (current) drug therapy: Secondary | ICD-10-CM | POA: Insufficient documentation

## 2016-11-24 DIAGNOSIS — C779 Secondary and unspecified malignant neoplasm of lymph node, unspecified: Secondary | ICD-10-CM | POA: Diagnosis not present

## 2016-11-24 DIAGNOSIS — Z803 Family history of malignant neoplasm of breast: Secondary | ICD-10-CM | POA: Insufficient documentation

## 2016-11-24 DIAGNOSIS — E119 Type 2 diabetes mellitus without complications: Secondary | ICD-10-CM | POA: Diagnosis not present

## 2016-11-24 DIAGNOSIS — Z7982 Long term (current) use of aspirin: Secondary | ICD-10-CM | POA: Diagnosis not present

## 2016-11-24 DIAGNOSIS — Z95828 Presence of other vascular implants and grafts: Secondary | ICD-10-CM

## 2016-11-24 DIAGNOSIS — J449 Chronic obstructive pulmonary disease, unspecified: Secondary | ICD-10-CM | POA: Diagnosis not present

## 2016-11-24 HISTORY — PX: PORTACATH PLACEMENT: SHX2246

## 2016-11-24 SURGERY — INSERTION, TUNNELED CENTRAL VENOUS DEVICE, WITH PORT
Anesthesia: General | Site: Chest | Laterality: Right

## 2016-11-24 MED ORDER — HEPARIN SOD (PORK) LOCK FLUSH 100 UNIT/ML IV SOLN
INTRAVENOUS | Status: AC
  Filled 2016-11-24: qty 5

## 2016-11-24 MED ORDER — LACTATED RINGERS IV SOLN
INTRAVENOUS | Status: DC | PRN
  Administered 2016-11-24: 10:00:00 via INTRAVENOUS

## 2016-11-24 MED ORDER — CEFAZOLIN SODIUM-DEXTROSE 2-4 GM/100ML-% IV SOLN: 2.0000 g | INTRAVENOUS | Status: DC

## 2016-11-24 MED ORDER — DEXAMETHASONE SODIUM PHOSPHATE 10 MG/ML IJ SOLN
INTRAMUSCULAR | Status: AC
  Filled 2016-11-24: qty 1

## 2016-11-24 MED ORDER — MIDAZOLAM HCL 5 MG/5ML IJ SOLN
INTRAMUSCULAR | Status: DC | PRN
  Administered 2016-11-24: 2 mg via INTRAVENOUS

## 2016-11-24 MED ORDER — CHLORHEXIDINE GLUCONATE CLOTH 2 % EX PADS: 6.0000 | MEDICATED_PAD | Freq: Once | CUTANEOUS | Status: DC

## 2016-11-24 MED ORDER — PROPOFOL 10 MG/ML IV BOLUS
INTRAVENOUS | Status: AC
  Filled 2016-11-24: qty 20

## 2016-11-24 MED ORDER — HEPARIN (PORCINE) IN NACL 2-0.9 UNIT/ML-% IJ SOLN
INTRAMUSCULAR | Status: AC | PRN
  Administered 2016-11-24: 1

## 2016-11-24 MED ORDER — EPHEDRINE SULFATE 50 MG/ML IJ SOLN
INTRAMUSCULAR | Status: DC | PRN
  Administered 2016-11-24: 10 mg via INTRAVENOUS

## 2016-11-24 MED ORDER — PHENYLEPHRINE 40 MCG/ML (10ML) SYRINGE FOR IV PUSH (FOR BLOOD PRESSURE SUPPORT)
PREFILLED_SYRINGE | INTRAVENOUS | Status: AC
  Filled 2016-11-24: qty 20

## 2016-11-24 MED ORDER — ACETAMINOPHEN 500 MG PO TABS
ORAL_TABLET | ORAL | Status: AC
  Filled 2016-11-24: qty 2

## 2016-11-24 MED ORDER — CELECOXIB 200 MG PO CAPS
ORAL_CAPSULE | ORAL | Status: AC
  Filled 2016-11-24: qty 1

## 2016-11-24 MED ORDER — HEPARIN SOD (PORK) LOCK FLUSH 100 UNIT/ML IV SOLN
INTRAVENOUS | Status: DC | PRN
  Administered 2016-11-24: 500 [IU]

## 2016-11-24 MED ORDER — PHENYLEPHRINE 40 MCG/ML (10ML) SYRINGE FOR IV PUSH (FOR BLOOD PRESSURE SUPPORT)
PREFILLED_SYRINGE | INTRAVENOUS | Status: AC
  Filled 2016-11-24: qty 10

## 2016-11-24 MED ORDER — EPHEDRINE 5 MG/ML INJ
INTRAVENOUS | Status: AC
  Filled 2016-11-24: qty 10

## 2016-11-24 MED ORDER — PHENYLEPHRINE HCL 10 MG/ML IJ SOLN
INTRAMUSCULAR | Status: DC | PRN
  Administered 2016-11-24 (×5): 80 ug via INTRAVENOUS

## 2016-11-24 MED ORDER — LACTATED RINGERS IV SOLN
INTRAVENOUS | Status: DC
  Administered 2016-11-24: 10:00:00 via INTRAVENOUS

## 2016-11-24 MED ORDER — GABAPENTIN 300 MG PO CAPS
ORAL_CAPSULE | ORAL | Status: AC
  Filled 2016-11-24: qty 1

## 2016-11-24 MED ORDER — LIDOCAINE 2% (20 MG/ML) 5 ML SYRINGE
INTRAMUSCULAR | Status: AC
  Filled 2016-11-24: qty 5

## 2016-11-24 MED ORDER — SCOPOLAMINE 1 MG/3DAYS TD PT72: 1.0000 | MEDICATED_PATCH | Freq: Once | TRANSDERMAL | Status: DC | PRN

## 2016-11-24 MED ORDER — ACETAMINOPHEN 500 MG PO TABS
1000.0000 mg | ORAL_TABLET | ORAL | Status: AC
  Administered 2016-11-24: 1000 mg via ORAL

## 2016-11-24 MED ORDER — FENTANYL CITRATE (PF) 100 MCG/2ML IJ SOLN
INTRAMUSCULAR | Status: AC
  Filled 2016-11-24: qty 2

## 2016-11-24 MED ORDER — MIDAZOLAM HCL 2 MG/2ML IJ SOLN
INTRAMUSCULAR | Status: AC
Start: 2016-11-24 — End: 2016-11-24
  Filled 2016-11-24: qty 2

## 2016-11-24 MED ORDER — MIDAZOLAM HCL 2 MG/2ML IJ SOLN: 1.0000 mg | INTRAMUSCULAR | Status: DC | PRN

## 2016-11-24 MED ORDER — ESMOLOL HCL 100 MG/10ML IV SOLN
INTRAVENOUS | Status: AC
  Filled 2016-11-24: qty 10

## 2016-11-24 MED ORDER — BUPIVACAINE-EPINEPHRINE (PF) 0.5% -1:200000 IJ SOLN
INTRAMUSCULAR | Status: DC | PRN
  Administered 2016-11-24: 9 mL

## 2016-11-24 MED ORDER — HYDROCODONE-ACETAMINOPHEN 5-325 MG PO TABS: 1.0000 | ORAL_TABLET | Freq: Four times a day (QID) | ORAL | 0 refills | Status: DC | PRN

## 2016-11-24 MED ORDER — HYDROMORPHONE HCL 1 MG/ML IJ SOLN: 0.2500 mg | INTRAMUSCULAR | Status: DC | PRN

## 2016-11-24 MED ORDER — FENTANYL CITRATE (PF) 100 MCG/2ML IJ SOLN: 50.0000 ug | INTRAMUSCULAR | Status: DC | PRN

## 2016-11-24 MED ORDER — DEXAMETHASONE SODIUM PHOSPHATE 4 MG/ML IJ SOLN
INTRAMUSCULAR | Status: DC | PRN
  Administered 2016-11-24: 10 mg via INTRAVENOUS

## 2016-11-24 MED ORDER — GABAPENTIN 300 MG PO CAPS
300.0000 mg | ORAL_CAPSULE | ORAL | Status: AC
  Administered 2016-11-24: 300 mg via ORAL

## 2016-11-24 MED ORDER — FENTANYL CITRATE (PF) 100 MCG/2ML IJ SOLN
INTRAMUSCULAR | Status: DC | PRN
  Administered 2016-11-24: 100 ug via INTRAVENOUS

## 2016-11-24 MED ORDER — CEFAZOLIN SODIUM-DEXTROSE 2-3 GM-% IV SOLR
INTRAVENOUS | Status: DC | PRN
  Administered 2016-11-24: 2 g via INTRAVENOUS

## 2016-11-24 MED ORDER — HEPARIN (PORCINE) IN NACL 2-0.9 UNIT/ML-% IJ SOLN
INTRAMUSCULAR | Status: AC
  Filled 2016-11-24: qty 500

## 2016-11-24 MED ORDER — ONDANSETRON HCL 4 MG/2ML IJ SOLN
INTRAMUSCULAR | Status: AC
  Filled 2016-11-24: qty 2

## 2016-11-24 MED ORDER — PROMETHAZINE HCL 25 MG/ML IJ SOLN: 6.2500 mg | INTRAMUSCULAR | Status: DC | PRN

## 2016-11-24 MED ORDER — CELECOXIB 200 MG PO CAPS
200.0000 mg | ORAL_CAPSULE | ORAL | Status: AC
  Administered 2016-11-24: 200 mg via ORAL

## 2016-11-24 MED ORDER — BUPIVACAINE-EPINEPHRINE (PF) 0.5% -1:200000 IJ SOLN
INTRAMUSCULAR | Status: AC
  Filled 2016-11-24: qty 60

## 2016-11-24 MED ORDER — CEFAZOLIN SODIUM-DEXTROSE 2-4 GM/100ML-% IV SOLN
INTRAVENOUS | Status: AC
  Filled 2016-11-24: qty 100

## 2016-11-24 MED ORDER — GLYCOPYRROLATE 0.2 MG/ML IJ SOLN
INTRAMUSCULAR | Status: DC | PRN
  Administered 2016-11-24: 0.1 mg via INTRAVENOUS

## 2016-11-24 SURGICAL SUPPLY — 54 items
ADH SKN CLS APL DERMABOND .7 (GAUZE/BANDAGES/DRESSINGS) ×1
APL SKNCLS STERI-STRIP NONHPOA (GAUZE/BANDAGES/DRESSINGS) ×1
BAG DECANTER FOR FLEXI CONT (MISCELLANEOUS) ×3 IMPLANT
BENZOIN TINCTURE PRP APPL 2/3 (GAUZE/BANDAGES/DRESSINGS) ×3 IMPLANT
BLADE HEX COATED 2.75 (ELECTRODE) ×3 IMPLANT
BLADE SURG 15 STRL LF DISP TIS (BLADE) ×1 IMPLANT
BLADE SURG 15 STRL SS (BLADE) ×6
CANISTER SUCT 1200ML W/VALVE (MISCELLANEOUS) IMPLANT
CHLORAPREP W/TINT 26ML (MISCELLANEOUS) ×3 IMPLANT
CLOSURE WOUND 1/2 X4 (GAUZE/BANDAGES/DRESSINGS) ×1
COVER BACK TABLE 60X90IN (DRAPES) ×3 IMPLANT
COVER MAYO STAND STRL (DRAPES) ×3 IMPLANT
COVER PROBE 5X48 (MISCELLANEOUS)
DECANTER SPIKE VIAL GLASS SM (MISCELLANEOUS) IMPLANT
DERMABOND ADVANCED (GAUZE/BANDAGES/DRESSINGS) ×2
DERMABOND ADVANCED .7 DNX12 (GAUZE/BANDAGES/DRESSINGS) ×1 IMPLANT
DRAPE C-ARM 42X72 X-RAY (DRAPES) ×3 IMPLANT
DRAPE LAPAROSCOPIC ABDOMINAL (DRAPES) ×3 IMPLANT
DRAPE UTILITY XL STRL (DRAPES) ×3 IMPLANT
DRSG TEGADERM 2-3/8X2-3/4 SM (GAUZE/BANDAGES/DRESSINGS) IMPLANT
DRSG TEGADERM 4X10 (GAUZE/BANDAGES/DRESSINGS) IMPLANT
DRSG TEGADERM 4X4.75 (GAUZE/BANDAGES/DRESSINGS) ×3 IMPLANT
ELECT REM PT RETURN 9FT ADLT (ELECTROSURGICAL) ×3
ELECTRODE REM PT RTRN 9FT ADLT (ELECTROSURGICAL) ×1 IMPLANT
GAUZE SPONGE 4X4 12PLY STRL LF (GAUZE/BANDAGES/DRESSINGS) ×3 IMPLANT
GLOVE EUDERMIC 7 POWDERFREE (GLOVE) ×3 IMPLANT
GOWN STRL REUS W/ TWL LRG LVL3 (GOWN DISPOSABLE) ×1 IMPLANT
GOWN STRL REUS W/ TWL XL LVL3 (GOWN DISPOSABLE) ×1 IMPLANT
GOWN STRL REUS W/TWL LRG LVL3 (GOWN DISPOSABLE) ×3
GOWN STRL REUS W/TWL XL LVL3 (GOWN DISPOSABLE) ×3
IV CATH PLACEMENT UNIT 16 GA (IV SOLUTION) IMPLANT
IV KIT MINILOC 20X1 SAFETY (NEEDLE) IMPLANT
KIT CVR 48X5XPRB PLUP LF (MISCELLANEOUS) ×1 IMPLANT
KIT PORT POWER 8FR ISP CVUE (Miscellaneous) ×3 IMPLANT
NDL HYPO 25X1 1.5 SAFETY (NEEDLE) ×1 IMPLANT
NEEDLE BLUNT 17GA (NEEDLE) IMPLANT
NEEDLE HYPO 22GX1.5 SAFETY (NEEDLE) ×1 IMPLANT
NEEDLE HYPO 25X1 1.5 SAFETY (NEEDLE) ×3 IMPLANT
PACK BASIN DAY SURGERY FS (CUSTOM PROCEDURE TRAY) ×3 IMPLANT
PENCIL BUTTON HOLSTER BLD 10FT (ELECTRODE) ×3 IMPLANT
SET SHEATH INTRODUCER 10FR (MISCELLANEOUS) IMPLANT
SHEATH COOK PEEL AWAY SET 9F (SHEATH) IMPLANT
SLEEVE SCD COMPRESS KNEE MED (MISCELLANEOUS) ×3 IMPLANT
STRIP CLOSURE SKIN 1/2X4 (GAUZE/BANDAGES/DRESSINGS) ×1 IMPLANT
SUT MNCRL AB 4-0 PS2 18 (SUTURE) ×3 IMPLANT
SUT PROLENE 2 0 CT2 30 (SUTURE) ×3 IMPLANT
SUT VICRYL 3-0 CR8 SH (SUTURE) ×3 IMPLANT
SYR 10ML LL (SYRINGE) ×3 IMPLANT
SYR 5ML LUER SLIP (SYRINGE) ×3 IMPLANT
TOWEL OR 17X24 6PK STRL BLUE (TOWEL DISPOSABLE) ×6 IMPLANT
TOWEL OR NON WOVEN STRL DISP B (DISPOSABLE) ×3 IMPLANT
TUBE CONNECTING 20'X1/4 (TUBING) ×1
TUBE CONNECTING 20X1/4 (TUBING) ×2 IMPLANT
YANKAUER SUCT BULB TIP NO VENT (SUCTIONS) ×3 IMPLANT

## 2016-11-24 NOTE — Anesthesia Preprocedure Evaluation (Addendum)
Anesthesia Evaluation  Patient identified by MRN, date of birth, ID band Patient awake    Reviewed: Allergy & Precautions, H&P , NPO status , Patient's Chart, lab work & pertinent test results, reviewed documented beta blocker date and time   History of Anesthesia Complications Negative for: history of anesthetic complications  Airway Mallampati: III  TM Distance: >3 FB Neck ROM: Full    Dental no notable dental hx. (+) Dental Advisory Given, Poor Dentition, Missing, Chipped   Pulmonary COPD, Current Smoker, former smoker,    Pulmonary exam normal breath sounds clear to auscultation       Cardiovascular hypertension, Pt. on medications  Rhythm:Regular Rate:Normal  '04 ECHO: normal LVF and valves   Neuro/Psych Anxiety negative neurological ROS     GI/Hepatic Neg liver ROS, GERD  Controlled,  Endo/Other  diabetesHypothyroidism Morbid obesity  Renal/GU negative Renal ROS     Musculoskeletal  (+) Arthritis , Osteoarthritis,    Abdominal (+) + obese,   Peds  Hematology negative hematology ROS (+)   Anesthesia Other Findings   Reproductive/Obstetrics                             Anesthesia Physical  Anesthesia Plan  ASA: III  Anesthesia Plan: General   Post-op Pain Management:    Induction: Intravenous  PONV Risk Score and Plan: 3 and Ondansetron, Dexamethasone and Diphenhydramine  Airway Management Planned: LMA  Additional Equipment:   Intra-op Plan:   Post-operative Plan: Extubation in OR  Informed Consent: I have reviewed the patients History and Physical, chart, labs and discussed the procedure including the risks, benefits and alternatives for the proposed anesthesia with the patient or authorized representative who has indicated his/her understanding and acceptance.   Dental advisory given  Plan Discussed with: Anesthesiologist, Surgeon and CRNA  Anesthesia Plan  Comments:        Anesthesia Quick Evaluation

## 2016-11-24 NOTE — Anesthesia Postprocedure Evaluation (Signed)
Anesthesia Post Note  Patient: Taylor Walsh  Procedure(s) Performed: Procedure(s) (LRB): INSERTION PORT-A-CATH (Right)     Patient location during evaluation: PACU Anesthesia Type: General Level of consciousness: awake and alert Pain management: pain level controlled Vital Signs Assessment: post-procedure vital signs reviewed and stable Respiratory status: spontaneous breathing, nonlabored ventilation and respiratory function stable Cardiovascular status: blood pressure returned to baseline and stable Postop Assessment: no signs of nausea or vomiting Anesthetic complications: no    Last Vitals:  Vitals:   11/24/16 1230 11/24/16 1245  BP: 126/75 126/77  Pulse: 89 87  Resp: 12 16  Temp:    SpO2: 100% 100%    Last Pain:  Vitals:   11/24/16 1230  TempSrc:   PainSc: 0-No pain                 Audry Pili

## 2016-11-24 NOTE — Transfer of Care (Signed)
Immediate Anesthesia Transfer of Care Note  Patient: Lenor Provencher Pangilinan  Procedure(s) Performed: Procedure(s): INSERTION PORT-A-CATH (Right)  Patient Location: PACU  Anesthesia Type:General  Level of Consciousness: awake and patient cooperative  Airway & Oxygen Therapy: Patient Spontanous Breathing and Patient connected to nasal cannula oxygen  Post-op Assessment: Report given to RN and Post -op Vital signs reviewed and stable  Post vital signs: Reviewed and stable  Last Vitals:  Vitals:   11/24/16 1139 11/24/16 1140  BP: 122/82   Pulse: 96 96  Resp:  (!) 23  Temp:    SpO2: 100% 100%    Last Pain:  Vitals:   11/24/16 1008  TempSrc: Oral         Complications: No apparent anesthesia complications

## 2016-11-24 NOTE — Anesthesia Procedure Notes (Signed)
Procedure Name: LMA Insertion Date/Time: 11/24/2016 10:44 AM Performed by: Toula Moos L Pre-anesthesia Checklist: Patient identified, Emergency Drugs available, Suction available, Patient being monitored and Timeout performed Patient Re-evaluated:Patient Re-evaluated prior to induction Oxygen Delivery Method: Circle system utilized Preoxygenation: Pre-oxygenation with 100% oxygen Induction Type: IV induction Ventilation: Mask ventilation without difficulty LMA: LMA inserted LMA Size: 4.0 Number of attempts: 1 Airway Equipment and Method: Bite block Placement Confirmation: positive ETCO2 Tube secured with: Tape Dental Injury: Teeth and Oropharynx as per pre-operative assessment

## 2016-11-24 NOTE — Interval H&P Note (Signed)
History and Physical Interval Note:  11/24/2016 10:17 AM  Taylor Walsh  has presented today for surgery, with the diagnosis of breast cancer  The various methods of treatment have been discussed with the patient and family. After consideration of risks, benefits and other options for treatment, the patient has consented to  Procedure(s): INSERTION PORT-A-CATH WITH ULTRASOUND (N/A) as a surgical intervention .  The patient's history has been reviewed, patient examined, no change in status, stable for surgery.  I have reviewed the patient's chart and labs.  Questions were answered to the patient's satisfaction.     Adin Hector

## 2016-11-24 NOTE — Op Note (Signed)
Patient Name:           Taylor Walsh   Date of Surgery:        11/24/2016  Pre op Diagnosis:      Cancer left breast  Post op Diagnosis:    Cancer left breast  Procedure:                 Insertion of 8 French power port clear view tunneled venous vascular access device.                                      Use of fluoroscopy for guidance and positioning  Surgeon:                     Haywood M. Ingram, M.D., FACS  Assistant:                      OR staff   Indication for Assistant: N/A  Operative Indications:  This is a 61-year-old female who is brought to the operating room for Port-A-Cath insertion.       On October 13, 2016 she underwent left total mastectomy, sentinel lymph node biopsy, and placement of tissue expander. 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 There was a close medial margin. Additional mastectomy skin flap resection showed no cancer. Positive LVI. 2 out of 2 lymph nodes showed metastatic cancer. ER 95%. PR 45%. Ki-67 15%. HER-2 negative.   she is doing well from her surgery.  Dr. Feng has recommended  adjuvant chemotherapy and the patient is in agreement.   I discussed the rationale for adjuvant therapies in general but told her Dr. Feng would make final decisions. She has recommended  chemotherapy and I discussed Port-A-Cath insertion with her in detail, including techniques and risks.  She understands and agrees with Port-A-Cath insertion..     Operative Findings:       The catheter was inserted through the right subclavian vein.  The catheter tip is in the superior vena cava near the right atrium.  The catheter flushes well and has excellent blood return.  C-arm imaging shows no deformity of the catheter.  Procedure in Detail:          Following the induction of general LMA anesthesia the patient was positioned with a small roll behind her shoulders and her arms tucked at her sides.  The neck and chest was  prepped and draped in a sterile fashion.  Surgical timeout was performed.  Intravenous antibiotics were given.  0.5% Marcaine with epinephrine was used as a local infiltration anesthetic.     A right subclavian venipuncture was performed.  Blood was aspirated with a single pass and the guidewire inserted into the central venous circulation.  The C-arm was used to confirm position of the wire and a template was marked on the chest wall to guide catheter length and positioning.  A small incision was made at the wire insertion site.  I then made a transverse incision below the medial third of the clavicle.  Subcutaneous pocket was created.  Using a tunneling device I passed the catheter from the wire insertion site to the port pocket site.  Using the template marking the chest wall I measured the catheter and cut it 23.5 cm.  The catheter was secured to the port   with the locking device and the port and catheter flushed with heparinized saline.  The port was sutured to the pectoralis fascia with 3 interrupted sutures of 2-0 Prolene.  The dilator and peel-away sheath were inserted easily over the guidewire into the central venous circulation.  The wire and dilator were removed, the catheter threaded without difficulty, and the peel-away sheath removed.  The port flushed easily and had excellent blood return.  C-arm fluoroscopy confirmed good positioning of the catheter tip and no deformity.  The wounds were irrigated.  There was no bleeding.  Subcutaneous tissue was closed with interrupted 3-0 Vicryl and the skin incisions were closed with subcuticular 4-0 Monocryl and Steri-Strips.  I took an angled Huber needle with IV extension tubing flushed with heparinized saline inserted this through the skin into the port.  Once again I confirmed good blood return and good flushing.  The IV extension tubing and port were flushed with concentrated heparin.  The Huber needle and IV extension tubing were secured with multiple  Steri-Strips.  Small bandage and a large Tegaderm were placed.  The patient tolerated the procedure well was taken to PACU in stable condition.  Chest x-ray will be obtained in the PACU.  Sponge needle initially counts were correct.  There were no complications.     Haywood M. Ingram, M.D., FACS General and Minimally Invasive Surgery Breast and Colorectal Surgery   Addendum: I logged on to the website and reviewed her prescription medication history.   11/24/2016 11:31 AM  

## 2016-11-24 NOTE — Discharge Instructions (Signed)

## 2016-11-25 ENCOUNTER — Other Ambulatory Visit (HOSPITAL_BASED_OUTPATIENT_CLINIC_OR_DEPARTMENT_OTHER): Payer: BC Managed Care – PPO

## 2016-11-25 ENCOUNTER — Ambulatory Visit (HOSPITAL_BASED_OUTPATIENT_CLINIC_OR_DEPARTMENT_OTHER): Payer: BC Managed Care – PPO

## 2016-11-25 ENCOUNTER — Ambulatory Visit (HOSPITAL_BASED_OUTPATIENT_CLINIC_OR_DEPARTMENT_OTHER): Payer: BC Managed Care – PPO | Admitting: Hematology

## 2016-11-25 ENCOUNTER — Ambulatory Visit: Payer: BC Managed Care – PPO

## 2016-11-25 ENCOUNTER — Encounter: Payer: Self-pay | Admitting: *Deleted

## 2016-11-25 ENCOUNTER — Telehealth: Payer: Self-pay | Admitting: Hematology

## 2016-11-25 ENCOUNTER — Encounter: Payer: Self-pay | Admitting: Hematology

## 2016-11-25 VITALS — BP 139/64 | HR 73 | Temp 98.1°F | Resp 18 | Ht 63.0 in | Wt 205.1 lb

## 2016-11-25 DIAGNOSIS — Z5111 Encounter for antineoplastic chemotherapy: Secondary | ICD-10-CM

## 2016-11-25 DIAGNOSIS — Z170 Estrogen receptor positive status [ER+]: Principal | ICD-10-CM

## 2016-11-25 DIAGNOSIS — M8580 Other specified disorders of bone density and structure, unspecified site: Secondary | ICD-10-CM | POA: Diagnosis not present

## 2016-11-25 DIAGNOSIS — Z5189 Encounter for other specified aftercare: Secondary | ICD-10-CM | POA: Diagnosis not present

## 2016-11-25 DIAGNOSIS — C50212 Malignant neoplasm of upper-inner quadrant of left female breast: Secondary | ICD-10-CM

## 2016-11-25 DIAGNOSIS — J99 Respiratory disorders in diseases classified elsewhere: Secondary | ICD-10-CM

## 2016-11-25 DIAGNOSIS — Z95828 Presence of other vascular implants and grafts: Secondary | ICD-10-CM

## 2016-11-25 DIAGNOSIS — E854 Organ-limited amyloidosis: Secondary | ICD-10-CM | POA: Diagnosis not present

## 2016-11-25 LAB — CBC WITH DIFFERENTIAL/PLATELET
BASO%: 0 % (ref 0.0–2.0)
Basophils Absolute: 0 10*3/uL (ref 0.0–0.1)
EOS%: 0.1 % (ref 0.0–7.0)
Eosinophils Absolute: 0 10*3/uL (ref 0.0–0.5)
HCT: 36.7 % (ref 34.8–46.6)
HEMOGLOBIN: 11.8 g/dL (ref 11.6–15.9)
LYMPH%: 10.8 % — AB (ref 14.0–49.7)
MCH: 28 pg (ref 25.1–34.0)
MCHC: 32.2 g/dL (ref 31.5–36.0)
MCV: 87 fL (ref 79.5–101.0)
MONO#: 1 10*3/uL — AB (ref 0.1–0.9)
MONO%: 9.1 % (ref 0.0–14.0)
NEUT#: 8.8 10*3/uL — ABNORMAL HIGH (ref 1.5–6.5)
NEUT%: 80 % — ABNORMAL HIGH (ref 38.4–76.8)
PLATELETS: 254 10*3/uL (ref 145–400)
RBC: 4.22 10*6/uL (ref 3.70–5.45)
RDW: 14.6 % — AB (ref 11.2–14.5)
WBC: 11.1 10*3/uL — ABNORMAL HIGH (ref 3.9–10.3)
lymph#: 1.2 10*3/uL (ref 0.9–3.3)

## 2016-11-25 LAB — COMPREHENSIVE METABOLIC PANEL
ALT: 24 U/L (ref 0–55)
AST: 20 U/L (ref 5–34)
Albumin: 3.8 g/dL (ref 3.5–5.0)
Alkaline Phosphatase: 74 U/L (ref 40–150)
Anion Gap: 8 mEq/L (ref 3–11)
BILIRUBIN TOTAL: 0.65 mg/dL (ref 0.20–1.20)
BUN: 10.8 mg/dL (ref 7.0–26.0)
CALCIUM: 10 mg/dL (ref 8.4–10.4)
CHLORIDE: 105 meq/L (ref 98–109)
CO2: 27 meq/L (ref 22–29)
CREATININE: 0.8 mg/dL (ref 0.6–1.1)
EGFR: 86 mL/min/{1.73_m2} — AB (ref >90)
Glucose: 148 mg/dl — ABNORMAL HIGH (ref 70–140)
Potassium: 4.1 mEq/L (ref 3.5–5.1)
Sodium: 139 mEq/L (ref 136–145)
Total Protein: 7.3 g/dL (ref 6.4–8.3)

## 2016-11-25 MED ORDER — PEGFILGRASTIM 6 MG/0.6ML ~~LOC~~ PSKT
6.0000 mg | PREFILLED_SYRINGE | Freq: Once | SUBCUTANEOUS | Status: AC
  Administered 2016-11-25: 6 mg via SUBCUTANEOUS
  Filled 2016-11-25: qty 0.6

## 2016-11-25 MED ORDER — PALONOSETRON HCL INJECTION 0.25 MG/5ML
INTRAVENOUS | Status: AC
  Filled 2016-11-25: qty 5

## 2016-11-25 MED ORDER — SODIUM CHLORIDE 0.9% FLUSH
10.0000 mL | INTRAVENOUS | Status: DC | PRN
  Administered 2016-11-25: 10 mL via INTRAVENOUS
  Filled 2016-11-25: qty 10

## 2016-11-25 MED ORDER — SODIUM CHLORIDE 0.9 % IV SOLN
Freq: Once | INTRAVENOUS | Status: AC
  Administered 2016-11-25: 10:00:00 via INTRAVENOUS
  Filled 2016-11-25: qty 5

## 2016-11-25 MED ORDER — DOXORUBICIN HCL CHEMO IV INJECTION 2 MG/ML
60.0000 mg/m2 | Freq: Once | INTRAVENOUS | Status: AC
  Administered 2016-11-25: 120 mg via INTRAVENOUS
  Filled 2016-11-25: qty 60

## 2016-11-25 MED ORDER — PALONOSETRON HCL INJECTION 0.25 MG/5ML
0.2500 mg | Freq: Once | INTRAVENOUS | Status: AC
  Administered 2016-11-25: 0.25 mg via INTRAVENOUS

## 2016-11-25 MED ORDER — SODIUM CHLORIDE 0.9 % IV SOLN
600.0000 mg/m2 | Freq: Once | INTRAVENOUS | Status: AC
  Administered 2016-11-25: 1200 mg via INTRAVENOUS
  Filled 2016-11-25: qty 60

## 2016-11-25 MED ORDER — SODIUM CHLORIDE 0.9% FLUSH
10.0000 mL | INTRAVENOUS | Status: DC | PRN
  Administered 2016-11-25: 10 mL
  Filled 2016-11-25: qty 10

## 2016-11-25 MED ORDER — HEPARIN SOD (PORK) LOCK FLUSH 100 UNIT/ML IV SOLN
500.0000 [IU] | Freq: Once | INTRAVENOUS | Status: AC | PRN
  Administered 2016-11-25: 500 [IU]
  Filled 2016-11-25: qty 5

## 2016-11-25 MED ORDER — SODIUM CHLORIDE 0.9 % IV SOLN
Freq: Once | INTRAVENOUS | Status: AC
  Administered 2016-11-25: 10:00:00 via INTRAVENOUS

## 2016-11-25 NOTE — Patient Instructions (Addendum)
[facility] Discharge Instructions for Patients Receiving Chemotherapy  Today you received the following chemotherapy agents Adriamycin and Cytoxan  To help prevent nausea and vomiting after your treatment, we encourage you to take your nausea medication as directed   If you develop nausea and vomiting that is not controlled by your nausea medication, call the clinic.   BELOW ARE SYMPTOMS THAT SHOULD BE REPORTED IMMEDIATELY:  *FEVER GREATER THAN 100.5 F  *CHILLS WITH OR WITHOUT FEVER  NAUSEA AND VOMITING THAT IS NOT CONTROLLED WITH YOUR NAUSEA MEDICATION  *UNUSUAL SHORTNESS OF BREATH  *UNUSUAL BRUISING OR BLEEDING  TENDERNESS IN MOUTH AND THROAT WITH OR WITHOUT PRESENCE OF ULCERS  *URINARY PROBLEMS  *BOWEL PROBLEMS  UNUSUAL RASH Items with * indicate a potential emergency and should be followed up as soon as possible.  Feel free to call the clinic you have any questions or concerns. The clinic phone number is [phone].  Please show the [facility] at check-in to the Emergency Department and triage nurse.   Cyclophosphamide/ CYTOXAN injection What is this medicine? CYCLOPHOSPHAMIDE (sye kloe FOSS fa mide) is a chemotherapy drug. It slows the growth of cancer cells. This medicine is used to treat many types of cancer like lymphoma, myeloma, leukemia, breast cancer, and ovarian cancer, to name a few. This medicine may be used for other purposes; ask your health care provider or pharmacist if you have questions. COMMON BRAND NAME(S): Cytoxan, Neosar What should I tell my health care provider before I take this medicine? They need to know if you have any of these conditions: -blood disorders -history of other chemotherapy -infection -kidney disease -liver disease -recent or ongoing radiation therapy -tumors in the bone marrow -an unusual or allergic reaction to cyclophosphamide, other chemotherapy, other medicines, foods, dyes, or  preservatives -pregnant or trying to get pregnant -breast-feeding How should I use this medicine? This drug is usually given as an injection into a vein or muscle or by infusion into a vein. It is administered in a hospital or clinic by a specially trained health care professional. Talk to your pediatrician regarding the use of this medicine in children. Special care may be needed. Overdosage: If you think you have taken too much of this medicine contact a poison control center or emergency room at once. NOTE: This medicine is only for you. Do not share this medicine with others. What if I miss a dose? It is important not to miss your dose. Call your doctor or health care professional if you are unable to keep an appointment. What may interact with this medicine? This medicine may interact with the following medications: -amiodarone -amphotericin B -azathioprine -certain antiviral medicines for HIV or AIDS such as protease inhibitors (e.g., indinavir, ritonavir) and zidovudine -certain blood pressure medications such as benazepril, captopril, enalapril, fosinopril, lisinopril, moexipril, monopril, perindopril, quinapril, ramipril, trandolapril -certain cancer medications such as anthracyclines (e.g., daunorubicin, doxorubicin), busulfan, cytarabine, paclitaxel, pentostatin, tamoxifen, trastuzumab -certain diuretics such as chlorothiazide, chlorthalidone, hydrochlorothiazide, indapamide, metolazone -certain medicines that treat or prevent blood clots like warfarin -certain muscle relaxants such as succinylcholine -cyclosporine -etanercept -indomethacin -medicines to increase blood counts like filgrastim, pegfilgrastim, sargramostim -medicines used as general anesthesia -metronidazole -natalizumab This list may not describe all possible interactions. Give your health care provider a list of all the medicines, herbs, non-prescription drugs, or dietary supplements you use. Also tell them if  you smoke, drink alcohol, or use illegal drugs. Some items may interact with your medicine. What should I  watch for while using this medicine? Visit your doctor for checks on your progress. This drug may make you feel generally unwell. This is not uncommon, as chemotherapy can affect healthy cells as well as cancer cells. Report any side effects. Continue your course of treatment even though you feel ill unless your doctor tells you to stop. Drink water or other fluids as directed. Urinate often, even at night. In some cases, you may be given additional medicines to help with side effects. Follow all directions for their use. Call your doctor or health care professional for advice if you get a fever, chills or sore throat, or other symptoms of a cold or flu. Do not treat yourself. This drug decreases your body's ability to fight infections. Try to avoid being around people who are sick. This medicine may increase your risk to bruise or bleed. Call your doctor or health care professional if you notice any unusual bleeding. Be careful brushing and flossing your teeth or using a toothpick because you may get an infection or bleed more easily. If you have any dental work done, tell your dentist you are receiving this medicine. You may get drowsy or dizzy. Do not drive, use machinery, or do anything that needs mental alertness until you know how this medicine affects you. Do not become pregnant while taking this medicine or for 1 year after stopping it. Women should inform their doctor if they wish to become pregnant or think they might be pregnant. Men should not father a child while taking this medicine and for 4 months after stopping it. There is a potential for serious side effects to an unborn child. Talk to your health care professional or pharmacist for more information. Do not breast-feed an infant while taking this medicine. This medicine may interfere with the ability to have a child. This medicine  has caused ovarian failure in some women. This medicine has caused reduced sperm counts in some men. You should talk with your doctor or health care professional if you are concerned about your fertility. If you are going to have surgery, tell your doctor or health care professional that you have taken this medicine. What side effects may I notice from receiving this medicine? Side effects that you should report to your doctor or health care professional as soon as possible: -allergic reactions like skin rash, itching or hives, swelling of the face, lips, or tongue -low blood counts - this medicine may decrease the number of white blood cells, red blood cells and platelets. You may be at increased risk for infections and bleeding. -signs of infection - fever or chills, cough, sore throat, pain or difficulty passing urine -signs of decreased platelets or bleeding - bruising, pinpoint red spots on the skin, black, tarry stools, blood in the urine -signs of decreased red blood cells - unusually weak or tired, fainting spells, lightheadedness -breathing problems -dark urine -dizziness -palpitations -swelling of the ankles, feet, hands -trouble passing urine or change in the amount of urine -weight gain -yellowing of the eyes or skin Side effects that usually do not require medical attention (report to your doctor or health care professional if they continue or are bothersome): -changes in nail or skin color -hair loss -missed menstrual periods -mouth sores -nausea, vomiting This list may not describe all possible side effects. Call your doctor for medical advice about side effects. You may report side effects to FDA at 1-800-FDA-1088. Where should I keep my medicine? This drug is given in a  hospital or clinic and will not be stored at home. NOTE: This sheet is a summary. It may not cover all possible information. If you have questions about this medicine, talk to your doctor, pharmacist, or  health care provider.  2018 Elsevier/Gold Standard (2012-01-29 16:22:58)    Doxorubicin/ ADRIAMYCIN injection What is this medicine? DOXORUBICIN (dox oh ROO bi sin) is a chemotherapy drug. It is used to treat many kinds of cancer like leukemia, lymphoma, neuroblastoma, sarcoma, and Wilms' tumor. It is also used to treat bladder cancer, breast cancer, lung cancer, ovarian cancer, stomach cancer, and thyroid cancer. This medicine may be used for other purposes; ask your health care provider or pharmacist if you have questions. COMMON BRAND NAME(S): Adriamycin, Adriamycin PFS, Adriamycin RDF, Rubex What should I tell my health care provider before I take this medicine? They need to know if you have any of these conditions: -heart disease -history of low blood counts caused by a medicine -liver disease -recent or ongoing radiation therapy -an unusual or allergic reaction to doxorubicin, other chemotherapy agents, other medicines, foods, dyes, or preservatives -pregnant or trying to get pregnant -breast-feeding How should I use this medicine? This drug is given as an infusion into a vein. It is administered in a hospital or clinic by a specially trained health care professional. If you have pain, swelling, burning or any unusual feeling around the site of your injection, tell your health care professional right away. Talk to your pediatrician regarding the use of this medicine in children. Special care may be needed. Overdosage: If you think you have taken too much of this medicine contact a poison control center or emergency room at once. NOTE: This medicine is only for you. Do not share this medicine with others. What if I miss a dose? It is important not to miss your dose. Call your doctor or health care professional if you are unable to keep an appointment. What may interact with this medicine? This medicine may interact with the following  medications: -6-mercaptopurine -paclitaxel -phenytoin -St. John's Wort -trastuzumab -verapamil This list may not describe all possible interactions. Give your health care provider a list of all the medicines, herbs, non-prescription drugs, or dietary supplements you use. Also tell them if you smoke, drink alcohol, or use illegal drugs. Some items may interact with your medicine. What should I watch for while using this medicine? This drug may make you feel generally unwell. This is not uncommon, as chemotherapy can affect healthy cells as well as cancer cells. Report any side effects. Continue your course of treatment even though you feel ill unless your doctor tells you to stop. There is a maximum amount of this medicine you should receive throughout your life. The amount depends on the medical condition being treated and your overall health. Your doctor will watch how much of this medicine you receive in your lifetime. Tell your doctor if you have taken this medicine before. You may need blood work done while you are taking this medicine. Your urine may turn red for a few days after your dose. This is not blood. If your urine is dark or brown, call your doctor. In some cases, you may be given additional medicines to help with side effects. Follow all directions for their use. Call your doctor or health care professional for advice if you get a fever, chills or sore throat, or other symptoms of a cold or flu. Do not treat yourself. This drug decreases your body's ability to fight  infections. Try to avoid being around people who are sick. This medicine may increase your risk to bruise or bleed. Call your doctor or health care professional if you notice any unusual bleeding. Talk to your doctor about your risk of cancer. You may be more at risk for certain types of cancers if you take this medicine. Do not become pregnant while taking this medicine or for 6 months after stopping it. Women should  inform their doctor if they wish to become pregnant or think they might be pregnant. Men should not father a child while taking this medicine and for 6 months after stopping it. There is a potential for serious side effects to an unborn child. Talk to your health care professional or pharmacist for more information. Do not breast-feed an infant while taking this medicine. This medicine has caused ovarian failure in some women and reduced sperm counts in some men This medicine may interfere with the ability to have a child. Talk with your doctor or health care professional if you are concerned about your fertility. What side effects may I notice from receiving this medicine? Side effects that you should report to your doctor or health care professional as soon as possible: -allergic reactions like skin rash, itching or hives, swelling of the face, lips, or tongue -breathing problems -chest pain -fast or irregular heartbeat -low blood counts - this medicine may decrease the number of white blood cells, red blood cells and platelets. You may be at increased risk for infections and bleeding. -pain, redness, or irritation at site where injected -signs of infection - fever or chills, cough, sore throat, pain or difficulty passing urine -signs of decreased platelets or bleeding - bruising, pinpoint red spots on the skin, black, tarry stools, blood in the urine -swelling of the ankles, feet, hands -tiredness -weakness Side effects that usually do not require medical attention (report to your doctor or health care professional if they continue or are bothersome): -diarrhea -hair loss -mouth sores -nail discoloration or damage -nausea -red colored urine -vomiting This list may not describe all possible side effects. Call your doctor for medical advice about side effects. You may report side effects to FDA at 1-800-FDA-1088. Where should I keep my medicine? This drug is given in a hospital or clinic  and will not be stored at home. NOTE: This sheet is a summary. It may not cover all possible information. If you have questions about this medicine, talk to your doctor, pharmacist, or health care provider.  2018 Elsevier/Gold Standard (2015-05-13 11:28:51)   Pegfilgrastim/ NEULASTA injection What is this medicine? PEGFILGRASTIM (PEG fil gra stim) is a long-acting granulocyte colony-stimulating factor that stimulates the growth of neutrophils, a type of white blood cell important in the body's fight against infection. It is used to reduce the incidence of fever and infection in patients with certain types of cancer who are receiving chemotherapy that affects the bone marrow, and to increase survival after being exposed to high doses of radiation. This medicine may be used for other purposes; ask your health care provider or pharmacist if you have questions. COMMON BRAND NAME(S): Neulasta What should I tell my health care provider before I take this medicine? They need to know if you have any of these conditions: -kidney disease -latex allergy -ongoing radiation therapy -sickle cell disease -skin reactions to acrylic adhesives (On-Body Injector only) -an unusual or allergic reaction to pegfilgrastim, filgrastim, other medicines, foods, dyes, or preservatives -pregnant or trying to get pregnant -breast-feeding How  should I use this medicine? This medicine is for injection under the skin. If you get this medicine at home, you will be taught how to prepare and give the pre-filled syringe or how to use the On-body Injector. Refer to the patient Instructions for Use for detailed instructions. Use exactly as directed. Tell your healthcare provider immediately if you suspect that the On-body Injector may not have performed as intended or if you suspect the use of the On-body Injector resulted in a missed or partial dose. It is important that you put your used needles and syringes in a special sharps  container. Do not put them in a trash can. If you do not have a sharps container, call your pharmacist or healthcare provider to get one. Talk to your pediatrician regarding the use of this medicine in children. While this drug may be prescribed for selected conditions, precautions do apply. Overdosage: If you think you have taken too much of this medicine contact a poison control center or emergency room at once. NOTE: This medicine is only for you. Do not share this medicine with others. What if I miss a dose? It is important not to miss your dose. Call your doctor or health care professional if you miss your dose. If you miss a dose due to an On-body Injector failure or leakage, a new dose should be administered as soon as possible using a single prefilled syringe for manual use. What may interact with this medicine? Interactions have not been studied. Give your health care provider a list of all the medicines, herbs, non-prescription drugs, or dietary supplements you use. Also tell them if you smoke, drink alcohol, or use illegal drugs. Some items may interact with your medicine. This list may not describe all possible interactions. Give your health care provider a list of all the medicines, herbs, non-prescription drugs, or dietary supplements you use. Also tell them if you smoke, drink alcohol, or use illegal drugs. Some items may interact with your medicine. What should I watch for while using this medicine? You may need blood work done while you are taking this medicine. If you are going to need a MRI, CT scan, or other procedure, tell your doctor that you are using this medicine (On-Body Injector only). What side effects may I notice from receiving this medicine? Side effects that you should report to your doctor or health care professional as soon as possible: -allergic reactions like skin rash, itching or hives, swelling of the face, lips, or tongue -dizziness -fever -pain, redness, or  irritation at site where injected -pinpoint red spots on the skin -red or dark-brown urine -shortness of breath or breathing problems -stomach or side pain, or pain at the shoulder -swelling -tiredness -trouble passing urine or change in the amount of urine Side effects that usually do not require medical attention (report to your doctor or health care professional if they continue or are bothersome): -bone pain -muscle pain This list may not describe all possible side effects. Call your doctor for medical advice about side effects. You may report side effects to FDA at 1-800-FDA-1088. Where should I keep my medicine? Keep out of the reach of children. Store pre-filled syringes in a refrigerator between 2 and 8 degrees C (36 and 46 degrees F). Do not freeze. Keep in carton to protect from light. Throw away this medicine if it is left out of the refrigerator for more than 48 hours. Throw away any unused medicine after the expiration date. NOTE: This  sheet is a summary. It may not cover all possible information. If you have questions about this medicine, talk to your doctor, pharmacist, or health care provider.  2018 Elsevier/Gold Standard (2016-03-12 12:58:03)

## 2016-11-25 NOTE — Addendum Note (Signed)
Addendum  created 11/25/16 0917 by Tawni Millers, CRNA   Charge Capture section accepted, Visit diagnoses modified

## 2016-11-25 NOTE — Patient Instructions (Signed)
Implanted Port Home Guide An implanted port is a type of central line that is placed under the skin. Central lines are used to provide IV access when treatment or nutrition needs to be given through a person's veins. Implanted ports are used for long-term IV access. An implanted port may be placed because:  You need IV medicine that would be irritating to the small veins in your hands or arms.  You need long-term IV medicines, such as antibiotics.  You need IV nutrition for a long period.  You need frequent blood draws for lab tests.  You need dialysis.  Implanted ports are usually placed in the chest area, but they can also be placed in the upper arm, the abdomen, or the leg. An implanted port has two main parts:  Reservoir. The reservoir is round and will appear as a small, raised area under your skin. The reservoir is the part where a needle is inserted to give medicines or draw blood.  Catheter. The catheter is a thin, flexible tube that extends from the reservoir. The catheter is placed into a large vein. Medicine that is inserted into the reservoir goes into the catheter and then into the vein.  How will I care for my incision site? Do not get the incision site wet. Bathe or shower as directed by your health care provider. How is my port accessed? Special steps must be taken to access the port:  Before the port is accessed, a numbing cream can be placed on the skin. This helps numb the skin over the port site.  Your health care provider uses a sterile technique to access the port. ? Your health care provider must put on a mask and sterile gloves. ? The skin over your port is cleaned carefully with an antiseptic and allowed to dry. ? The port is gently pinched between sterile gloves, and a needle is inserted into the port.  Only "non-coring" port needles should be used to access the port. Once the port is accessed, a blood return should be checked. This helps ensure that the port  is in the vein and is not clogged.  If your port needs to remain accessed for a constant infusion, a clear (transparent) bandage will be placed over the needle site. The bandage and needle will need to be changed every week, or as directed by your health care provider.  Keep the bandage covering the needle clean and dry. Do not get it wet. Follow your health care provider's instructions on how to take a shower or bath while the port is accessed.  If your port does not need to stay accessed, no bandage is needed over the port.  What is flushing? Flushing helps keep the port from getting clogged. Follow your health care provider's instructions on how and when to flush the port. Ports are usually flushed with saline solution or a medicine called heparin. The need for flushing will depend on how the port is used.  If the port is used for intermittent medicines or blood draws, the port will need to be flushed: ? After medicines have been given. ? After blood has been drawn. ? As part of routine maintenance.  If a constant infusion is running, the port may not need to be flushed.  How long will my port stay implanted? The port can stay in for as long as your health care provider thinks it is needed. When it is time for the port to come out, surgery will be   done to remove it. The procedure is similar to the one performed when the port was put in. When should I seek immediate medical care? When you have an implanted port, you should seek immediate medical care if:  You notice a bad smell coming from the incision site.  You have swelling, redness, or drainage at the incision site.  You have more swelling or pain at the port site or the surrounding area.  You have a fever that is not controlled with medicine.  This information is not intended to replace advice given to you by your health care provider. Make sure you discuss any questions you have with your health care provider. Document  Released: 03/16/2005 Document Revised: 08/22/2015 Document Reviewed: 11/21/2012 [employer] Interactive Patient Education  2017 [employer]  

## 2016-11-25 NOTE — Telephone Encounter (Signed)
Scheduled appt per 8/29 los - patient is aware and will pick up a new schedule next visit.

## 2016-11-25 NOTE — Progress Notes (Addendum)
[place]  [phone] [phone]  Clinic Follow-up Note   Patient Care Team: Marton Redwood, MD as PCP - General (Internal Medicine) Melrose Nakayama, MD as Consulting Physician (Cardiothoracic Surgery) Fanny Skates, MD as Consulting Physician (General Surgery) Truitt Merle, MD as Consulting Physician (Hematology) 11/25/2016   CHIEF COMPLAINTS F/U left breast cancer   Oncology History   Cancer Staging Breast cancer of upper-inner quadrant of left female breast [facility]) Staging form: Breast, AJCC 8th Edition - Clinical stage from 07/10/2016: Stage 0 (cTis (DCIS), cN0, cM0, G3, ER: Positive, PR: Positive, HER2: Not Assessed) - Signed by Truitt Merle, MD on 07/28/2016 - Pathologic stage from 10/26/2016: Stage IA (pT1a, pN1a, cM0, G2, ER: Positive, PR: Positive, HER2: Negative) - Signed by Truitt Merle, MD on 11/11/2016       Breast cancer of upper-inner quadrant of left female breast ([place])   07/10/2016 Initial Biopsy    Diagnosis 1. 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 2. 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      07/10/2016 Receptors her2    ER 95%, PR 80-90%+, both strong staining, Ki67 80-90%      07/22/2016 Initial Diagnosis    Ductal carcinoma in situ (DCIS) of left breast     07/27/2016 Imaging    Breast MRI w wo contrast showed  1. Extensive mass and non mass enhancement along the lower inner aspect the left breast, lower outer quadrant, spanning 10.2 cm from anterior to posterior, including both biopsied lesions, as detailed above. This is all consistent with DCIS. 2. No other abnormal enhancement in the left breast. 3. No abnormal or enlarged axillary lymph nodes. 4. No evidence of malignancy in the right breast      08/10/2016 Genetic Testing    ATM c.[id number] VUS identified on the Common Hereditary Cancer panel. The Hereditary Gene Panel offered by [facility] includes sequencing and/or deletion duplication testing of the following 46 genes: APC, ATM, AXIN2, BARD1, BMPR1A, BRCA1, BRCA2, BRIP1, CDH1, CDKN2A (p14ARF), CDKN2A (p16INK4a), CHEK2, CTNNA1, DICER1, EPCAM (Deletion/duplication testing only), GREM1 (promoter region deletion/duplication testing only), KIT, MEN1, MLH1, MSH2, MSH3, MSH6, MUTYH, NBN, NF1, NHTL1, PALB2, PDGFRA, PMS2, POLD1, POLE, PTEN, RAD50, RAD51C, RAD51D, SDHB, SDHC, SDHD, SMAD4, SMARCA4. STK11, TP53, TSC1, TSC2, and VHL.  The following gene was evaluated for sequence changes only: SDHA and HOXB13 c.251G>A variant only.  The report date is Aug 09, 2016.       10/13/2016 Surgery    LEFT TOTAL MASTECTOMY WITH LEFT AXILLARY SENTINEL LYMPH NODE BIOPSY By Dr. Ninfa Linden      10/13/2016 Pathology Results    Surgery Diagnosis 10/13/16  1. Lymph node, sentinel, biopsy, Left Axillary - METASTATIC CARCINOMA IN ONE LYMPH NODE (1/1). 2. Lymph node, sentinel, biopsy, Left - METASTATIC CARCINOMA IN ONE LYMPH NODE (1/1). 3. Breast, radical mastectomy (including lymph nodes), Left - INVASIVE DUCTAL CARCINOMA, 0.3 CM. - LYMPHOVASCULAR INVOLVEMENT BY CARCINOMA. - HIGH GRADE DUCTAL CARCINOMA IN SITU WITH CALCIFICATIONS AND NECROSIS, 5.5 CM. - DCIS FOCALLY 0.1 CM FROM CAUTERIZED MEDIAL MARGIN. - SEE ONCOLOGY TABLE AND COMMENT. 4. Skin , Left additional mastectomy flap - BENIGN FIBROADIPOSE TISSUE. - NO EVIDENCE OF MALIGNANCY.      10/30/2016 Miscellaneous    MammaPrint 10/31/26 Result:  High Risk,Luminal type B MPI: -0.368 Average 10-year risk of  recurrence untreated: 29%      11/18/2016 Imaging    CT CAP W Contrast 11/18/16 IMPRESSION: 1. Skin thickening and interstitial changes involving the left breast, likely related to radiation. Surgical changes are also noted with a tissue expander in  place. Probable postop fluid collection in the upper outer quadrant breast. No enlarged supraclavicular or axillary lymph nodes. 2. No CT findings suggesting metastatic disease involving the chest, abdomen or pelvis or bony structures. 3. Surgical changes from a left upper lobe wedge resection. No findings for recurrent tumor.      11/23/2016 Imaging    Bone Scan Whole body 11/23/16 IMPRESSION: No definite scintigraphic evidence of osseous metastatic disease as above.      11/25/2016 -  Chemotherapy    Adjuvant chemotherapy AC every 2 weeks for 4 cycles followed by Taxol weekly for 12 cycles starting 11/25/16         HISTORY OF PRESENTING ILLNESS (07/28/16):  Taylor Walsh 61 y.o. 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 She presents to the clinic today by herself. She was referred by her surgeon Dr. Dalbert Batman.   This was discovered by routine screening mammogram. She denies any palpable breast mass or adenopathy. She denies any other new symptoms. She was contacted for her need of a biopsy to further investigate the calcification found in her mammogram. A biopsy was done leading to a diagnosis of her DCIS. She reports having arthritis in the right knee. She reported having a tubal ligation and hysterectomy. She also has famaily history of breast cancer with mother and sister. She reports to live with her daughter and 2 of her grandchildren. She reports of having history of HTN, arthritis, Thyroidism, being Pre-diabetic, Osteopenia and is currently back to Smoking. She also reports her brother has a history of blood clots and he was told that it was hereditary which she is following up with Dr. Brigitte Pulse about testing.   GYN HISTORY  Menarchal: 11 LMP: 36 before hysterectomy Contraceptive: N/A HRT: no GP:G2P2    CURRENT THERAPY: Adjuvant chemotherapy AC every 2 weeks for 4 cycles followed by Taxol weekly for 12 cycles starting 11/25/16  INTERVAL HISTORY:   Taylor Walsh is here for a follow up and first cycle AC. She had a port placement yesterday and is currently accessed. The site is covered with a gauze dressing. She reports the procedure went well; minimal pain is well-managed with ice. No pain medication has been necessary. She is eating well and has slight weight gain. She denies breast tenderness, pain in stomach, coughing, chest pain, fever or chills. Her throat is "scratchy," she suspects from ET tube during procedure. She is prone to constipation and occasional nausea, but she has had daily bowel movements lately and has no issues at present. She feels well informed for chemotherapy today, but has some questions about her anti-emetic medication regimen. She is also asking about a prescription for a wig. We discussed her plan for time away from work, she is currently using sick/vacation time. She has not returned to work since her breast surgery. When she is ready to use FMLA, she will provide [place] the paperwork to fill out.  Taylor Walsh. She had a left upper lobe wedge resection in 02/26/15 with Dr. Roxan Hockey. Further workup for systemic Walsh is unknown to me, we will check SPEP and light chains. She has no other concerns at this time.  MEDICAL HISTORY:  Past Medical History:  Diagnosis Date  . Anxiety   . Arthritis   . Cancer ([place]) 10/08/2016   left breast cancer  . Diabetes mellitus without complication ([place])   . Dyslipidemia   . Family history of breast cancer   . Family history of ovarian cancer   . Family history of prostate cancer   . GERD (gastroesophageal reflux disease)    nexium if needed  . Heart murmur   . Hypertension   . Hypothyroid   . Osteopenia   . Pneumonia 10/08/2016   June 2013    SURGICAL HISTORY: Past Surgical History:  Procedure Laterality Date  . ABDOMINAL HYSTERECTOMY    . BREAST RECONSTRUCTION WITH PLACEMENT OF TISSUE EXPANDER AND FLEX HD (ACELLULAR HYDRATED  DERMIS) Left 10/13/2016   Procedure: LEFT BREAST RECONSTRUCTION WITH PLACEMENT OF TISSUE EXPANDER AND ALLODERM;  Surgeon: Irene Limbo, MD;  Location: [facility];  Service: Plastics;  Laterality: Left;  . FOOT SURGERY     bi-lat/ repaired hammer toes  . MASTECTOMY W/ SENTINEL NODE BIOPSY Left 10/13/2016  . MASTECTOMY W/ SENTINEL NODE BIOPSY Left 10/13/2016   Procedure: LEFT TOTAL MASTECTOMY WITH LEFT AXILLARY SENTINEL LYMPH NODE BIOPSY;  Surgeon: Coralie Keens, MD;  Location: [facility];  Service: General;  Laterality: Left;  . PORTACATH PLACEMENT Right 11/24/2016   Procedure: INSERTION PORT-A-CATH;  Surgeon: Fanny Skates, MD;  Location: [facility];  Service: General;  Laterality: Right;  . TUBAL LIGATION    . VIDEO ASSISTED THORACOSCOPY (VATS)/WEDGE RESECTION Left 02/25/2015   Procedure: VIDEO ASSISTED THORACOSCOPY (VATS)/ LUL WEDGE RESECTION with ON Q placement.;  Surgeon: Melrose Nakayama, MD;  Location: [facility];  Service: Thoracic;  Laterality: Left;    SOCIAL HISTORY: Social History   Social History  . Marital status: Single    Spouse name: N/A  . Number of children: 2  . Years of education: N/A   Occupational History  . Not on file.   Social History Main Topics  . Smoking status: Former Smoker    Packs/day: 1.00    Years: 42.00    Quit date: 08/27/2016  . Smokeless tobacco: Never Used     Comment: 5/1/18she smokes occasionally when stressed. She is smoking 4 cigarettes a week  . Alcohol use No  . Drug use: No  . Sexual activity: Not on file   Other Topics Concern  . Not on file   Social History Narrative  . No narrative on file    FAMILY HISTORY: Family History  Problem Relation Age of Onset  . Cancer Mother        brain, ovarian  . Hypertension Mother   . Stroke Father   . Hypertension Father   . Cancer Sister 9       bilateral breast cancer, 2nd cancer at 7  . Cancer Brother        dx with prostate cancer in the 31s  . Cancer Brother          dx in his 66s with prostate cancer  . Brain cancer Maternal Uncle   . Cirrhosis Brother   . Cancer Brother        NOS  . Cancer Paternal Aunt        NOS  . Cancer Cousin        paternal cousin with cancer NOS    ALLERGIES:  is allergic to vicodin [hydrocodone-acetaminophen].  MEDICATIONS:  Current Outpatient Prescriptions  Medication Sig Dispense Refill  .  aspirin 81 MG tablet Take 81 mg by mouth daily.    Marland Kitchen atorvastatin (LIPITOR) 40 MG tablet Take 40 mg by mouth daily.    . calcium-vitamin D 250-100 MG-UNIT tablet Take 2 tablets by mouth daily.     . ergocalciferol (VITAMIN D2) 50000 UNITS capsule Take 50,000 Units by mouth every Friday.     Marland Kitchen HYDROcodone-acetaminophen (NORCO) 5-325 MG tablet Take 1-2 tablets by mouth every 6 (six) hours as needed for moderate pain or severe pain. 20 tablet 0  . levothyroxine (SYNTHROID, LEVOTHROID) 88 MCG tablet Take 88 mcg by mouth daily before breakfast.     . olmesartan (BENICAR) 20 MG tablet Take 20 mg by mouth daily.    . traMADol (ULTRAM) 50 MG tablet Take 50 mg by mouth every 6 (six) hours as needed.    . valACYclovir (VALTREX) 1000 MG tablet Take 1,000 mg by mouth daily as needed (for sores).     Marland Kitchen lidocaine-prilocaine (EMLA) cream Apply to affected area once (Patient not taking: Reported on 11/25/2016) 30 g 3  . methocarbamol (ROBAXIN) 500 MG tablet Take 1 tablet (500 mg total) by mouth every 8 (eight) hours as needed for muscle spasms. (Patient not taking: Reported on 11/25/2016) 30 tablet 0  . ondansetron (ZOFRAN) 8 MG tablet Take 1 tablet (8 mg total) by mouth 2 (two) times daily as needed. Start on the third day after chemotherapy. (Patient not taking: Reported on 11/25/2016) 30 tablet 1  . prochlorperazine (COMPAZINE) 10 MG tablet Take 1 tablet (10 mg total) by mouth every 6 (six) hours as needed (Nausea or vomiting). (Patient not taking: Reported on 11/25/2016) 30 tablet 1   No current facility-administered medications for this  visit.     REVIEW OF SYSTEMS:  Constitutional: Denies fever or chills Eyes: Denies blurriness of vision, double vision or watery eyes Ears, nose, mouth, throat, and face: Denies mucositis. (+) "scratchy" throat, likely from ET tube Respiratory: Denies cough, dyspnea or wheezes Cardiovascular: Denies palpitation, chest discomfort or lower extremity swelling Gastrointestinal:  Denies nausea, vomiting, constipation, diarrhea, or heartburn or change in bowel habits Skin: Denies abnormal skin rashes Lymphatics: Denies new lymphadenopathy or easy bruising Musculoskeletal: (+) arthritis in the right knee Neurological:Denies numbness, tingling or new weaknesses Breast (+) Left breast tenderness, improving  Behavioral/Psych: Mood is stable, no new changes  All other systems were reviewed with the patient and are negative.  PHYSICAL EXAMINATION: ECOG PERFORMANCE STATUS: 0 - Asymptomatic  Vitals:   11/25/16 0837  BP: 139/64  Pulse: 73  Resp: 18  Temp: 98.1 F (36.7 C)  SpO2: 100%   Filed Weights   11/25/16 0837  Weight: 205 lb 1.6 oz (93 kg)    GENERAL:alert, no distress and comfortable SKIN: skin color, texture, turgor are normal, no rashes or significant lesions EYES: normal, conjunctiva are pink and non-injected, sclera clear OROPHARYNX:no exudate, no erythema and lips, buccal mucosa, and tongue normal  NECK: supple, thyroid normal size, non-tender, without nodularity LYMPH:  no palpable cervical or axillary lymphadenopathy LUNGS: clear to auscultation. Mildly diminished right upper lobe but clear. Normal breathing effort HEART: regular rate & rhythm, no murmurs and no lower extremity edema ABDOMEN:abdomen soft, non-tender and normal bowel sounds. No palpable hepatosplenomegaly or masses.  Musculoskeletal:no cyanosis of digits and no clubbing  PSYCH: alert & oriented x 3 with fluent speech NEURO: no focal motor/sensory deficits Breasts: Breast inspection showed them to be  symmetrical with no nipple discharge. (+) Two small nodules at biopsy side located  lower-inner quadrant and next to nipple of left breast. Possible hematoma from biopsy. (+) S/p mastectomy with tissue expander placement with incision healed very well, left breast is firm, no tenderness.   LABORATORY DATA:  I have reviewed the data as listed CBC Latest Ref Rng & Units 11/25/2016 10/08/2016 02/27/2015  WBC 3.9 - 10.3 10e3/uL 11.1(H) 5.3 7.7  Hemoglobin 11.6 - 15.9 g/dL 11.8 13.6 11.9(L)  Hematocrit 34.8 - 46.6 % 36.7 40.1 37.2  Platelets 145 - 400 10e3/uL 254 288 222    CMP Latest Ref Rng & Units 11/25/2016 10/08/2016 02/27/2015  Glucose 70 - 140 mg/dl 148(H) 99 135(H)  BUN 7.0 - 26.0 mg/dL 10._0 Creatinine 0.6 - 1.1 mg/dL 0.8 0.84 0.75  Sodium 136 - 145 mEq/L 139 138 140  Potassium 3.5 - 5.1 mEq/L 4.1 3.8 3.8  Chloride 101 - 111 mmol/L - 105 106  CO2 22 - 29 mEq/L _1 Calcium 8.4 - 10.4 mg/dL 10.0 9.3 8.4(L)  Total Protein 6.4 - 8.3 g/dL 7.3 7.0 5.9(L)  Total Bilirubin 0.20 - 1.20 mg/dL 0.65 1.1 0.7  Alkaline Phos 40 - 150 U/L 74 69 50  AST 5 - 34 U/L _2 ALT 0 - 55 U/L _3 PATHOLOGY:   Surgery Diagnosis 10/13/16  1. Lymph node, sentinel, biopsy, Left Axillary - METASTATIC CARCINOMA IN ONE LYMPH NODE (1/1). 2. Lymph node, sentinel, biopsy, Left - METASTATIC CARCINOMA IN ONE LYMPH NODE (1/1). 3. Breast, radical mastectomy (including lymph nodes), Left - INVASIVE DUCTAL CARCINOMA, 0.3 CM. - LYMPHOVASCULAR INVOLVEMENT BY CARCINOMA. - HIGH GRADE DUCTAL CARCINOMA IN SITU WITH CALCIFICATIONS AND NECROSIS, 5.5 CM. - DCIS FOCALLY 0.1 CM FROM CAUTERIZED MEDIAL MARGIN. - SEE ONCOLOGY TABLE AND COMMENT. 4. Skin , Left additional mastectomy flap - BENIGN FIBROADIPOSE TISSUE. - NO EVIDENCE OF MALIGNANCY. Microscopic Comment 3. BREAST, INVASIVE TUMOR Procedure: Mastectomy and two sentinel lymph nodes with additional left mastectomy flap. Laterality: Left  breast Tumor Size: 0.3 cm invasive carcinoma; 5.5 cm DCIS Histologic Type: Ductal Grade: II Tubular Differentiation: 2 Nuclear Pleomorphism: 2 Mitotic Count: 2 Ductal Carcinoma in Situ (DCIS): Present, high grade with necrosis Extent of Tumor: Skin: Free of tumor Nipple: Free of tumor Microscopic Comment(continued) Skeletal muscle: Free of tumor Margins: Free of tumor Invasive carcinoma, distance from closest margin: Greater than 1.5 cm DCIS, distance from closest margin: Less than 0.1 cm from medial margin Regional Lymph Nodes: Number of Lymph Nodes Examined: 2 Number of Sentinel Lymph Nodes Examined: 2 Lymph Nodes with Macrometastases: 2 Lymph Nodes with Micrometastases: 0 Lymph Nodes with Isolated Tumor Cells: 0 Breast Prognostic Profile: Pending Estrogen Receptor: Pending Progesterone Receptor: Pending Her2: Pending Ki-67: Pending Best tumor block for sendout testing: 3[facility] Pathologic Stage Classification (pTNM, AJCC 8th Edition): Primary Tumor (pT): pT1a Regional Lymph Nodes (pN): pN1a Distant Metastases (pM): pMX Comments: There is extensive high grade ductal carcinoma in situ with necrosis. Multiple additional portions of the tissue are examined and there is a 0.3 cm invasive ductal carcinoma associated with lymphovascular involvement by tumor. Breast prognostic profile will be performed on the invasive carcinoma and reported as an addendum. Immunohistochemistry shows positive basal cell staining in the DCIS with calponin, smooth muscle myosin and p63. ADDITIONAL INFORMATION: 3. By immunohistochemistry, the tumor cells are Negative for Her2 (1+). Vicente Males MD Pathologist, Electronic Signature ( Signed 10/21/2016) 3. PROGNOSTIC INDICATORS For Part 3[facility] Results: IMMUNOHISTOCHEMICAL AND MORPHOMETRIC ANALYSIS PERFORMED MANUALLY Estrogen Receptor: 95%, POSITIVE,  STRONG STAINING INTENSITY Progesterone Receptor: 45%, POSITIVE, STRONG STAINING INTENSITY Proliferation  Marker Ki67: 15% ADDITIONAL INFORMATION:(continued) Results: HER2 - *EQUIVOCAL*. OF NOTE, A TOTAL OF 40 TUMOR CELLS WERE EVALUATED FOR HER2 EXPRESSION. HER2 BY IMMUNOHISTOCHEMISTRY WILL BE PERFORMED AND THE RESULTS REPORTED SEPARATELY. RATIO OF HER2/CEP17 SIGNALS 1.67 AVERAGE HER2 COPY NUMBER PER CELL 4.18   Surgery: 07/10/16 Diagnosis 1. 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 - SEE MICROSCOPIC DESCRIPTION. 2. 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 - SEE MICROSCOPIC DESCRIPTION. Microscopic Comment 1. Prognostic markers have been ordered and will be reported in an addendum. 2. Prognostic markers have been ordered and will be reported in an addendum. Called to the [facility] on 07/13/16. Results: IMMUNOHISTOCHEMICAL AND MORPHOMETRIC ANALYSIS PERFORMED MANUALLY Estrogen Receptor: 95%, POSITIVE, STRONG STAINING INTENSITY Progesterone Receptor: 80%, POSITIVE, STRONG STAINING INTENSITY  Biopsy: 02/25/15 Diagnosis 1. 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 - NO ATYPIA OR MALIGNANCY IDENTIFIED. - SEE COMMENT. 2. Lymph node, biopsy, 12 L - ONE BENIGN LYMPH NODE WITH NO TUMOR SEEN (0/1). 3. Lymph node, biopsy, 9 L - ONE BENIGN LYMPH NODE WITH NO TUMOR SEEN (0/1). Microscopic Comment 1. A Congo Red stain is performed and a crystal violet stain is performed on the lung specimen. The staining pattern (apple green birefringence) coupled with the morphology is consistent with amyloid deposition. Dr. Zenda Alpers, Dr. Fletcher Anon and Dr. Darl Householder have all seen this case in consultation with agreement that amyloid deposition is present. Dr.  Donato Heinz has also seen this case in consultation with agreement of the additional findings in the case.   MammaPrint 10/31/26 Result:  High Risk at 94.6% MPI: -0.368 Average 10-year risk of recurrence untreated: 29%   Genetics 07/29/16    PROCEDURE  ECHO 11/17/16 Study Conclusions - Left ventricle: The cavity size was normal. Wall thickness was   normal. Systolic function was normal. The estimated ejection   fraction was in the range of 60% to 65%. Wall motion was normal;   there were no regional wall motion abnormalities. Features are   consistent with a pseudonormal left ventricular filling pattern,   with concomitant abnormal relaxation and increased filling   pressure (grade 2 diastolic dysfunction). - Aortic valve: There was mild regurgitation. Valve area (VTI):   1.84 cm^2. Valve area (Vmax): 1.76 cm^2. Valve area (Vmean): 1.89   cm^2.  RADIOGRAPHIC STUDIES: I have personally reviewed the radiological images as listed and agreed with the findings in the report. Ct Chest W Contrast  Result Date: 11/18/2016 CLINICAL DATA:  Staging left breast cancer. EXAM: CT CHEST, ABDOMEN, AND PELVIS WITH CONTRAST TECHNIQUE: Multidetector CT imaging of the chest, abdomen and pelvis was performed following the standard protocol during bolus administration of intravenous contrast. CONTRAST:  151m ISOVUE-300 IOPAMIDOL (ISOVUE-300) INJECTION 61% COMPARISON:  Chest CT 05/27/2016 and prior PET-CT 01/22/2015 FINDINGS: CT CHEST FINDINGS Cardiovascular: The heart is normal in size. No pericardial effusion. The aorta is normal in caliber. Mild tortuosity and scattered calcifications. No dissection. The branch vessels are patent. No obvious coronary artery calcifications. Mediastinum/Nodes: Small scattered mediastinal and hilar lymph nodes. No mass or lymphadenopathy. No change since prior study. The esophagus is grossly normal. Lungs/Pleura: Stable surgical changes involving the left upper lobe.  No worrisome  pulmonary lesions or acute pulmonary findings. No pleural effusion. No pleural nodules. Chest wall/ Musculoskeletal: There is a need the left breast tissue expander in place. There is moderate skin thickening and interstitial change involving the left breast. No obvious mass. There is a fluid collection in the upper outer quadrant left breast adjacent to the tissue expander which may be a postoperative liquified hematoma or seroma. No enlarged axillary or supraclavicular lymph nodes are identified. A few small scattered lymph nodes are noted. No significant bony findings. CT ABDOMEN PELVIS FINDINGS Hepatobiliary: No focal hepatic lesions to suggest metastatic disease. The portal and hepatic veins are patent. The gallbladder is normal. No common bile duct dilatation. Pancreas: No mass, inflammation or ductal dilatation. Spleen: Normal size.  No focal lesions. Adrenals/Urinary Tract: The adrenal glands and kidneys are unremarkable. The bladder appears normal. Stomach/Bowel: The stomach, duodenum, small bowel and colon are unremarkable. No acute inflammatory process, mass lesions or obstructive findings. The terminal ileum is normal. The appendix is normal. Vascular/Lymphatic: Moderate atherosclerotic calcifications involving the aorta and iliac arteries but no aneurysm or dissection. The branch vessels are patent. The major venous structures are patent. Small scattered mesenteric and retroperitoneal lymph nodes but no mass or overt adenopathy. Reproductive: The uterus and right ovary are surgically absent. The left ovary is present and appears normal. Other: No pelvic mass or adenopathy. No free pelvic fluid collections. No inguinal mass or adenopathy. No abdominal wall hernia or subcutaneous lesions. Musculoskeletal: No significant bony findings. Moderate degenerative changes involving the spine, most notably the degenerative disc disease at L5-S1. No worrisome bone lesions to suggest metastatic disease. IMPRESSION:  1. Skin thickening and interstitial changes involving the left breast, likely related to radiation. Surgical changes are also noted with a tissue expander in place. Probable postop fluid collection in the upper outer quadrant breast. No enlarged supraclavicular or axillary lymph nodes. 2. No CT findings suggesting metastatic disease involving the chest, abdomen or pelvis or bony structures. 3. Surgical changes from a left upper lobe wedge resection. No findings for recurrent tumor. Electronically Signed   By: Marijo Sanes M.D.   On: 11/18/2016 16:09   Nm Bone Scan Whole Body  Result Date: 11/23/2016 CLINICAL DATA:  LEFT breast cancer, new diagnosis EXAM: NUCLEAR MEDICINE WHOLE BODY BONE SCAN TECHNIQUE: Whole body anterior and posterior images were obtained approximately 3 hours after intravenous injection of radiopharmaceutical. RADIOPHARMACEUTICALS:  19 mCi Technetium-65mMDP IV COMPARISON:  CT chest abdomen pelvis 11/18/2016 FINDINGS: Uptake at the shoulders, knees, and feet typically degenerative. Uptake in the cervical region may reflect thyroid localization of free pertechnetate, with potential shine through on posterior images. No definite abnormal foci of increased osseous tracer accumulation are identified which are suspicious for osseous metastatic disease. Small amount of tracer localization within the LEFT breast which could be related to skin thickening/trabeculation or surgical changes present on CT. Otherwise expected urinary tract and soft tissue distribution of tracer. IMPRESSION: No definite scintigraphic evidence of osseous metastatic disease as above. Electronically Signed   By: MLavonia DanaM.D.   On: 11/23/2016 16:53   Ct Abdomen Pelvis W Contrast  Result Date: 11/18/2016 CLINICAL DATA:  Staging left breast cancer. EXAM: CT CHEST, ABDOMEN, AND PELVIS WITH CONTRAST TECHNIQUE: Multidetector CT imaging of the chest, abdomen and pelvis was performed following the standard protocol during  bolus administration of intravenous contrast. CONTRAST:  1031mISOVUE-300 IOPAMIDOL (ISOVUE-300) INJECTION 61% COMPARISON:  Chest CT 05/27/2016 and prior PET-CT 01/22/2015 FINDINGS: CT CHEST  FINDINGS Cardiovascular: The heart is normal in size. No pericardial effusion. The aorta is normal in caliber. Mild tortuosity and scattered calcifications. No dissection. The branch vessels are patent. No obvious coronary artery calcifications. Mediastinum/Nodes: Small scattered mediastinal and hilar lymph nodes. No mass or lymphadenopathy. No change since prior study. The esophagus is grossly normal. Lungs/Pleura: Stable surgical changes involving the left upper lobe. No worrisome pulmonary lesions or acute pulmonary findings. No pleural effusion. No pleural nodules. Chest wall/ Musculoskeletal: There is a need the left breast tissue expander in place. There is moderate skin thickening and interstitial change involving the left breast. No obvious mass. There is a fluid collection in the upper outer quadrant left breast adjacent to the tissue expander which may be a postoperative liquified hematoma or seroma. No enlarged axillary or supraclavicular lymph nodes are identified. A few small scattered lymph nodes are noted. No significant bony findings. CT ABDOMEN PELVIS FINDINGS Hepatobiliary: No focal hepatic lesions to suggest metastatic disease. The portal and hepatic veins are patent. The gallbladder is normal. No common bile duct dilatation. Pancreas: No mass, inflammation or ductal dilatation. Spleen: Normal size.  No focal lesions. Adrenals/Urinary Tract: The adrenal glands and kidneys are unremarkable. The bladder appears normal. Stomach/Bowel: The stomach, duodenum, small bowel and colon are unremarkable. No acute inflammatory process, mass lesions or obstructive findings. The terminal ileum is normal. The appendix is normal. Vascular/Lymphatic: Moderate atherosclerotic calcifications involving the aorta and iliac  arteries but no aneurysm or dissection. The branch vessels are patent. The major venous structures are patent. Small scattered mesenteric and retroperitoneal lymph nodes but no mass or overt adenopathy. Reproductive: The uterus and right ovary are surgically absent. The left ovary is present and appears normal. Other: No pelvic mass or adenopathy. No free pelvic fluid collections. No inguinal mass or adenopathy. No abdominal wall hernia or subcutaneous lesions. Musculoskeletal: No significant bony findings. Moderate degenerative changes involving the spine, most notably the degenerative disc disease at L5-S1. No worrisome bone lesions to suggest metastatic disease. IMPRESSION: 1. Skin thickening and interstitial changes involving the left breast, likely related to radiation. Surgical changes are also noted with a tissue expander in place. Probable postop fluid collection in the upper outer quadrant breast. No enlarged supraclavicular or axillary lymph nodes. 2. No CT findings suggesting metastatic disease involving the chest, abdomen or pelvis or bony structures. 3. Surgical changes from a left upper lobe wedge resection. No findings for recurrent tumor. Electronically Signed   By: Marijo Sanes M.D.   On: 11/18/2016 16:09   Dg Chest Port 1 View  Result Date: 11/24/2016 CLINICAL DATA:  Status post Port-A-Cath appliance placement on the right. Left breast malignancy. EXAM: PORTABLE CHEST 1 VIEW COMPARISON:  Chest x-ray of March 12, 2015 FINDINGS: The lungs are adequately inflated. There is no postprocedure pneumothorax following porta catheter placement. The tip of the catheter projects at the junction of the middle and distal portions of the SVC. The reservoir overlies the mid thorax. The cardiac silhouette is top-normal in size. The central pulmonary vascularity is mildly prominent but has since accentuated by the portable technique. There is calcification in the wall of the aortic arch. The bony thorax  exhibits no acute abnormality. IMPRESSION: No immediate postprocedure complication following Port-A-Cath appliance placement. Thoracic aortic atherosclerosis. Electronically Signed   By: David  Martinique M.D.   On: 11/24/2016 12:19   Dg Fluoro Guide Cv Line-no Report  Result Date: 11/24/2016 Fluoroscopy was utilized by the requesting physician.  No radiographic interpretation.  ASSESSMENT & PLAN:  ANAYSIA GERMER is 61 y.o. who present to the clinic with Ductal carcinoma in situ (DCIS) of left breast due to begin dose dense A/C today.   1. 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 - She had mostly DCIS in her breast but There was small component of invasive cancer that has spread to 2 of her lymph nodes.  Her surgical margins were negative. -Her mammaprint showed high-risk disease, luminal type B -Due to multiple positive lymph nodes, she underwent CT and bone scan to rule out any more positive lymph nodes or cancer that has spread anywhere else.  -MD discussed there was no obvious adenopathy on CT and no abnormalities on bone scan, so she will likely not need surgery.  -MD discussed the chemotherapy regimen and she will begin adjuvant dose dense Adriamycin and Cytoxan (A/C) every 2 weeks for 4 cycles beginning today. She will then have weekly taxol for 12 cycles. Due to positive lymph node she will get radiation afterward with Dr. Isidore Moos.  -We reviewed her CT Scan from 11/18/16 and she does not need re- excision for lymph node dissection. Her ECHO showed normal EF, adequate to continue with chemo cycle 1 today.  -She has recovered well from surgery, incision has healed well, lab reviewed, adequate for treatment, we'll proceed with cycle 1 chemotherapy AC today with onpro -Potential side effects of chemotherapy reviewed with patient  again, especially neutropenia fever, management of nausea avoid dehydration, Neulasta recent bone pain, etc..  -I discussed the possibility of returning to work with light duties in approximately 2 months, but we will monitor her response to treatment to determine if and when to return to work.  -I discussed to contact us if she experiences side effects of chemo  -We discussed anti-medic medication regimen today, and her questions were answered to satisfaction.  2.Arthritis -She experiences mild arthritis in LE, more specific to her right knee -She is pretty active   3. Osteopenia: -Last bone density scan ordered by Dr Brigitte Pulse in the last 2 years -Will f/u with PCP and scans every 2 years  4. Smoking cessation -The patient has quit smoking in 07/2016  5. 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 The Hereditary Gene Panel offered by [facility] includes sequencing and/or deletion duplication testing of the following 46 genes: APC, ATM, AXIN2, BARD1, BMPR1A, BRCA1, BRCA2, BRIP1, CDH1, CDKN2A (p14ARF), CDKN2A (p16INK4a), CHEK2, CTNNA1, DICER1, EPCAM (Deletion/duplication testing only), GREM1 (promoter region deletion/duplication testing only), KIT, MEN1, MLH1, MSH2, MSH3, MSH6, MUTYH, NBN, NF1, NHTL1, PALB2, PDGFRA, PMS2, POLD1, POLE, PTEN, RAD50, RAD51C, RAD51D, SDHB, SDHC, SDHD, SMAD4, SMARCA4. STK11, TP53, TSC1, TSC2, and VHL.  The following gene was evaluated for sequence changes only: SDHA and HOXB13 c.251G>A variant only.  The report date is Aug 09, 2016.  6. Pulmonary Walsh - she was found to have a left upper lobe lung nodule, status post surgical resection, which showed pulmonary Walsh. Her workup was negative for systemic Walsh  -I will plan to repeat SPEP and light chain level on next visit  PLAN:  -She will proceed with cycle 1 A/C today with onpro -Prescription for wig  -Lab, flush, f/u and AC chemo in 2, 4 and 6 weeks -Walsh  labs with next lab visit  Orders Placed This Encounter  Procedures  . CBC with  Differential    Standing Status:   Standing    Number of Occurrences:   50    Standing Expiration Date:   11/25/2021  . Comprehensive metabolic panel    Standing Status:   Standing    Number of Occurrences:   50    Standing Expiration Date:   11/25/2021  . 24-Hr Ur UPEP/UIFE/Light Chains/TP    Standing Status:   Future    Standing Expiration Date:   11/25/2017  . Multiple Myeloma Panel (SPEP&IFE w/QIG)    Standing Status:   Future    Standing Expiration Date:   11/25/2017    All questions were answered. The patient knows to call the clinic with any problems, questions or concerns. I spent 25 minutes counseling the patient face to face. The total time spent in the appointment was 30 minutes and more than 50% was on counseling.  Cira Rue, AGNP-C 11/25/2016   Addendum I have seen the patient, examined her. I agree with the assessment and and plan and have edited the notes.    Truitt Merle, MD 11/25/2016   This document serves as a record of services personally performed by Truitt Merle, MD. It was created on her behalf by Joslyn Devon, a trained medical scribe. The creation of this record is based on the scribe's personal observations and the provider's statements to them. This document has been checked and approved by the attending provider.

## 2016-11-26 ENCOUNTER — Encounter: Payer: BC Managed Care – PPO | Admitting: Physical Therapy

## 2016-12-01 ENCOUNTER — Ambulatory Visit: Payer: BC Managed Care – PPO | Attending: Plastic Surgery | Admitting: Physical Therapy

## 2016-12-01 DIAGNOSIS — M25612 Stiffness of left shoulder, not elsewhere classified: Secondary | ICD-10-CM | POA: Diagnosis not present

## 2016-12-01 DIAGNOSIS — R600 Localized edema: Secondary | ICD-10-CM | POA: Insufficient documentation

## 2016-12-01 DIAGNOSIS — M25512 Pain in left shoulder: Secondary | ICD-10-CM | POA: Diagnosis present

## 2016-12-01 DIAGNOSIS — M6281 Muscle weakness (generalized): Secondary | ICD-10-CM | POA: Diagnosis present

## 2016-12-01 NOTE — Therapy (Signed)
[place], [place], [address] Phone: [phone]   Fax:  [phone]  Physical Therapy Treatment  Patient Details  Name: Taylor Walsh MRN: [id number] Date of Birth: 04/24/1955 Referring Provider: [place]  Encounter Date: 12/01/2016      PT End of Session - 12/01/16 1445    Visit Number 5   Number of Visits 9   Date for PT Re-Evaluation 12/09/16   PT Start Time 6378  pt. late today   PT Stop Time 1434   PT Time Calculation (min) 43 min   Activity Tolerance Patient tolerated treatment well   Behavior During Therapy WFL for tasks assessed/performed      Past Medical History:  Diagnosis Date  . Anxiety   . Arthritis   . Cancer ([place]) 10/08/2016   left breast cancer  . Diabetes mellitus without complication ([place])   . Dyslipidemia   . Family history of breast cancer   . Family history of ovarian cancer   . Family history of prostate cancer   . GERD (gastroesophageal reflux disease)    nexium if needed  . Heart murmur   . Hypertension   . Hypothyroid   . Osteopenia   . Pneumonia 10/08/2016   June 2013    Past Surgical History:  Procedure Laterality Date  . ABDOMINAL HYSTERECTOMY    . BREAST RECONSTRUCTION WITH PLACEMENT OF TISSUE EXPANDER AND FLEX HD (ACELLULAR HYDRATED DERMIS) Left 10/13/2016   Procedure: LEFT BREAST RECONSTRUCTION WITH PLACEMENT OF TISSUE EXPANDER AND ALLODERM;  Surgeon: Irene Limbo, MD;  Location: [facility];  Service: Plastics;  Laterality: Left;  . FOOT SURGERY     bi-lat/ repaired hammer toes  . MASTECTOMY W/ SENTINEL NODE BIOPSY Left 10/13/2016  . MASTECTOMY W/ SENTINEL NODE BIOPSY Left 10/13/2016   Procedure: LEFT TOTAL MASTECTOMY WITH LEFT AXILLARY SENTINEL LYMPH NODE BIOPSY;  Surgeon: Coralie Keens, MD;  Location: [facility];  Service: General;  Laterality: Left;  . PORTACATH PLACEMENT Right 11/24/2016   Procedure: INSERTION PORT-A-CATH;  Surgeon: Fanny Skates, MD;  Location: [facility];  Service: General;  Laterality: Right;  . TUBAL LIGATION    . VIDEO ASSISTED THORACOSCOPY (VATS)/WEDGE RESECTION Left 02/25/2015   Procedure: VIDEO ASSISTED THORACOSCOPY (VATS)/ LUL WEDGE RESECTION with ON Q placement.;  Surgeon: Melrose Nakayama, MD;  Location: [facility];  Service: Thoracic;  Laterality: Left;    There were no vitals filed for this visit.      Subjective Assessment - 12/01/16 1355    Subjective The only thing I wanted to ask--because they had me cancel my Thursday appointment last week--should I add an appointment on the back end. Just felt tired, sleepy after the last chemo, which was her first treatment. Today and yesterday had someright side pain.   Currently in Pain? No/denies            [facility] PT Assessment - 12/01/16 0001      AROM   Left Shoulder Flexion 145 Degrees  prior to stretching   Left Shoulder ABduction 114 Degrees  prior to stretching                     [facility] Adult PT Treatment/Exercise - 12/01/16 0001      Shoulder Exercises: Seated   Other Seated Exercises backward shoulder rolls, bilat. shoulder er with scapular retraction at end of session     Shoulder Exercises: Pulleys   Flexion 2 minutes   ABduction  2 minutes     Shoulder Exercises: Therapy Ball   Flexion 10 reps  with hold at end of stretc   ABduction 10 reps  on L with hold at end of stretch     Manual Therapy   Passive ROM to left shoulder in direction of flexion, horizontal abduction, and abduction                        Long Term Clinic Goals - 12/01/16 1400      CC Long Term Goal  #1   Title Pt will be able to independently verbalize lymphedema risk reduction practices   Status On-going     CC Long Term Goal  #2   Title Pt will be independent in a home exercise program for continued strengthening and stretching   Status Partially Met     CC Long Term Goal  #3   Title Pt will report  a 75% improvement in left lateral trunk swelling to allow improved comfort when left arm is at side   Baseline 75% as of 12/01/16, though she still feels it some.   Status Achieved     CC Long Term Goal  #4   Title Pt will demonstrate 160 degrees of left shoulder abduction to allow pt to reach out to sides.   Status Partially Met     CC Long Term Goal  #5   Title Pt will demonstrate 160 degrees of left shoulder flexion to allow her to reach items overhead   Status Partially Met            Plan - 12/01/16 1445    Clinical Impression Statement Pt. did well with ROM exercise and P/ROM today, but still has tightness. She has partially but not fully met goals.  She planned to reschedule the appt. she missed last week for the end of her course of treatment.  She will benefit from continued therapy.   Rehab Potential Good   Clinical Impairments Affecting Rehab Potential pt. has started chemotherapy   PT Frequency 2x / week   PT Duration 4 weeks   PT Treatment/Interventions ADLs/Self Care Home Management;Therapeutic exercise;Therapeutic activities;Patient/family education;Passive range of motion;Scar mobilization;Manual lymph drainage;Manual techniques;Taping   PT Next Visit Plan pulleys, ball, continue gentle AA/A/PROM to left shoulder, do MLD here if time allows   Consulted and Agree with Plan of Care Patient      Patient will benefit from skilled therapeutic intervention in order to improve the following deficits and impairments:  Decreased knowledge of precautions, Increased edema, Decreased range of motion, Decreased strength, Postural dysfunction, Pain, Increased fascial restricitons  Visit Diagnosis: Stiffness of left shoulder, not elsewhere classified  Acute pain of left shoulder     Problem List Patient Active Problem List   Diagnosis Date Noted  . DCIS (ductal carcinoma in situ) of breast 10/13/2016  . Genetic testing 08/10/2016  . Family history of breast cancer   .  Family history of prostate cancer   . Family history of ovarian cancer   . Breast cancer of upper-inner quadrant of left female breast ([place]) 07/22/2016  . Cigarette smoker 05/20/2016  . 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 Dyspnea 06/24/2015  . Nodule of left lung 02/25/2015  . Hypertension 01/28/2015  . Hypothyroidism 01/28/2015  . GERD (gastroesophageal reflux disease) 01/28/2015  . Lung mass 01/07/2015    SALISBURY,DONNA 12/01/2016, 2:49 PM  [place], [place],  [address] Phone: [phone]   Fax:  [phone]  Name: LATIYA NAVIA MRN: [id number] Date of Birth: [birth year]  Serafina Royals, PT 12/01/16 2:49 PM

## 2016-12-03 ENCOUNTER — Ambulatory Visit: Payer: BC Managed Care – PPO | Admitting: Physical Therapy

## 2016-12-03 ENCOUNTER — Telehealth: Payer: Self-pay

## 2016-12-03 DIAGNOSIS — M25612 Stiffness of left shoulder, not elsewhere classified: Secondary | ICD-10-CM

## 2016-12-03 DIAGNOSIS — M25512 Pain in left shoulder: Secondary | ICD-10-CM

## 2016-12-03 DIAGNOSIS — M6281 Muscle weakness (generalized): Secondary | ICD-10-CM

## 2016-12-03 DIAGNOSIS — R600 Localized edema: Secondary | ICD-10-CM

## 2016-12-03 NOTE — Patient Instructions (Signed)
Flexion (Isometric)      Cancer Rehab [phone]    Press right fist against wall. Hold __5__ seconds. Repeat _5-10___ times. Do __1-2__ sessions per day.  SHOULDER: Abduction (Isometric)    Use wall as resistance. Press arm against pillow. Hold _5__ seconds. _5-10__ times. Do _1-2__ sessions per day.    External Rotation (Isometric)    Place back of left fist against door frame, with elbow bent. Press fist against door frame. Hold __5__ seconds. Repeat _5-10___ times. Do _1-2___ sessions per day.  Extension (Isometric)    Place left bent elbow and back of arm against wall. Press elbow against wall. Hold __5__ seconds. Repeat _5-10___ times. Do _1-2___ sessions per day.   Warrior I Pose    In wide stride stance, feet facing forward, bend right knee and extend left leg behind, heel elevated. Distribute weight equally between front foot and ball of back foot. Extend arms upward beside ears. Hold for __2_ breaths. Repeat with other leg forward. Raise arms only if able  Repeat _3__ times, alternating legs.  Copyright  VHI. All rights reserved.

## 2016-12-03 NOTE — Telephone Encounter (Signed)
Yesterday pt had inside tongue mouth throat starts feel funny. Food feels like it is slowly passing down. Yesterday was laying around d/t not feel well, poor energy. She denied beefy red, nor white spots.  Discussed biotene, baking soda rinses or salt water gargles. Instructed her to call later today or tomorrow if it gets worse.   Discussed low blood counts at day 7-14 and how this effects symptoms. Discussed premeds with 12 hour and 3 day effects in the system.   She had D1C1 AC on 8/29 Next OV 9/13

## 2016-12-03 NOTE — Therapy (Signed)
[place], [place], [address] Phone: [phone]   Fax:  [phone]  Physical Therapy Treatment  Patient Details  Name: Taylor Walsh MRN: [id number] Date of Birth: 27-Feb-1956 Referring Provider: Thimmappa  Encounter Date: 12/03/2016      PT End of Session - 12/03/16 1823    Visit Number 6   Number of Visits 9   Date for PT Re-Evaluation 12/09/16   PT Start Time 1300   PT Stop Time 1345   PT Time Calculation (min) 45 min      Past Medical History:  Diagnosis Date  . Anxiety   . Arthritis   . Cancer ([facility]) 10/08/2016   left breast cancer  . Diabetes mellitus without complication ([facility])   . Dyslipidemia   . Family history of breast cancer   . Family history of ovarian cancer   . Family history of prostate cancer   . GERD (gastroesophageal reflux disease)    nexium if needed  . Heart murmur   . Hypertension   . Hypothyroid   . Osteopenia   . Pneumonia 10/08/2016   June 2013    Past Surgical History:  Procedure Laterality Date  . ABDOMINAL HYSTERECTOMY    . BREAST RECONSTRUCTION WITH PLACEMENT OF TISSUE EXPANDER AND FLEX HD (ACELLULAR HYDRATED DERMIS) Left 10/13/2016   Procedure: LEFT BREAST RECONSTRUCTION WITH PLACEMENT OF TISSUE EXPANDER AND ALLODERM;  Surgeon: Irene Limbo, MD;  Location: [facility];  Service: Plastics;  Laterality: Left;  . FOOT SURGERY     bi-lat/ repaired hammer toes  . MASTECTOMY W/ SENTINEL NODE BIOPSY Left 10/13/2016  . MASTECTOMY W/ SENTINEL NODE BIOPSY Left 10/13/2016   Procedure: LEFT TOTAL MASTECTOMY WITH LEFT AXILLARY SENTINEL LYMPH NODE BIOPSY;  Surgeon: Coralie Keens, MD;  Location: [facility];  Service: General;  Laterality: Left;  . PORTACATH PLACEMENT Right 11/24/2016   Procedure: INSERTION PORT-A-CATH;  Surgeon: Fanny Skates, MD;  Location: [facility];  Service: General;  Laterality: Right;  . TUBAL LIGATION    . VIDEO ASSISTED  THORACOSCOPY (VATS)/WEDGE RESECTION Left 02/25/2015   Procedure: VIDEO ASSISTED THORACOSCOPY (VATS)/ LUL WEDGE RESECTION with ON Q placement.;  Surgeon: Melrose Nakayama, MD;  Location: [facility];  Service: Thoracic;  Laterality: Left;    There were no vitals filed for this visit.      Subjective Assessment - 12/03/16 1355    Subjective Pt states she was doing well until yesterday when she developed a sore thoat. she feels it is a side effect from the chemo. she wil going to get her sleeve after this appoinment. She feels her shoulder is doing good She says she does her exercises when she feels she can do them She feels like her arm still has a little more to go.    Pertinent History Presented following screening MMG with new left breast calcifications. 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 MRI showed mass and NME spanning 10.2 cm from anterior to posterior, including both biopsied lesions. No abnormal or enlarged LN, and no evidence of malignancy in the right breast. Final pathology mastectomy 0.3 cm IDC, 5.5 cm high grade DCIS focally 0.1 cm from medial margin, 2/2 SLN +, +LVI. 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 Yes  not feeling great    Pain Score --  did not rate    Pain Location --   Pain Orientation --   Pain Descriptors / Indicators --   Aggravating Factors  --   Multiple Pain Sites --                         [facility] Adult PT Treatment/Exercise - 12/03/16 0001      Balance Poses: Yoga   Warrior I 2 reps  2 breaths      Shoulder Exercises: Pulleys   Flexion 2 minutes   ABduction 2 minutes     Shoulder Exercises:  ROM/Strengthening   Wall Wash 10 reps with towel      Shoulder Exercises: Isometric Strengthening   Flexion 5X5"   Extension 5X5"   ABduction 3X5"     Manual Therapy   Manual Therapy Passive ROM   Passive ROM to left shoulder in direction of flexion, horizontal abduction, and abduction                        [facility] Goals - 12/01/16 1400      CC Long Term Goal  #1   Title Pt will be able to independently verbalize lymphedema risk reduction practices   Status On-going     CC Long Term Goal  #2   Title Pt will be independent in a home exercise program for continued strengthening and stretching   Status Partially Met     CC Long Term Goal  #3   Title Pt will report a 75% improvement in left lateral trunk swelling to allow improved comfort when left arm is at side   Baseline 75% as of 12/01/16, though she still feels it some.   Status Achieved     CC Long Term Goal  #4   Title Pt will demonstrate 160 degrees of left shoulder abduction to allow pt to reach out to sides.   Status Partially Met     CC Long Term Goal  #5   Title Pt will demonstrate 160 degrees of left shoulder flexion to allow her to reach items overhead   Status Partially Met            Plan - 12/03/16 1823    Clinical Impression Statement Pt report some side effects of chemo including throat and mouth pain and fatigue.  She is making progress with her arm range of motion and strength but still feels pain at end ranges   Clinical Impairments Affecting Rehab Potential pt. has started chemotherapy   PT Next Visit Plan pulleys, ball, continue gentle AA/A/PROM to left shoulder, do MLD here if time allows  consider supine scap series for strengthening    Consulted and Agree with Plan of Care Patient      Patient will benefit from skilled therapeutic intervention in order to improve the following deficits and impairments:  Decreased knowledge of precautions, Increased edema, Decreased  range of motion, Decreased strength, Postural dysfunction, Pain, Increased fascial restricitons  Visit Diagnosis: Stiffness of left shoulder, not elsewhere classified  Acute pain of left shoulder  Muscle weakness (generalized)  Localized edema     Problem List Patient Active Problem List   Diagnosis Date Noted  . DCIS (ductal carcinoma in situ)  of breast 10/13/2016  . Genetic testing 08/10/2016  . Family history of breast cancer   . Family history of prostate cancer   . Family history of ovarian cancer   . Breast cancer of upper-inner quadrant of left female breast ([facility]) 07/22/2016  . Cigarette smoker 05/20/2016  . 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 Dyspnea 06/24/2015  . Nodule of left lung 02/25/2015  . Hypertension 01/28/2015  . Hypothyroidism 01/28/2015  . GERD (gastroesophageal reflux disease) 01/28/2015  . Lung mass 01/07/2015   Donato Heinz. Owens Shark PT  [facility]o 12/03/2016, 6:26 PM  [place], [place], [address] Phone: [phone]   Fax:  [phone]  Name: Taylor Walsh MRN: [id number] Date of Birth: 06-20-1955

## 2016-12-04 ENCOUNTER — Emergency Department (HOSPITAL_COMMUNITY)
Admission: EM | Admit: 2016-12-04 | Discharge: 2016-12-05 | Disposition: A | Discharge status: Home / Self Care | Payer: BC Managed Care – PPO | Attending: Emergency Medicine | Admitting: Emergency Medicine

## 2016-12-04 ENCOUNTER — Encounter (HOSPITAL_COMMUNITY): Payer: Self-pay

## 2016-12-04 DIAGNOSIS — C50212 Malignant neoplasm of upper-inner quadrant of left female breast: Secondary | ICD-10-CM | POA: Diagnosis not present

## 2016-12-04 DIAGNOSIS — Z7982 Long term (current) use of aspirin: Secondary | ICD-10-CM | POA: Diagnosis not present

## 2016-12-04 DIAGNOSIS — I10 Essential (primary) hypertension: Secondary | ICD-10-CM | POA: Insufficient documentation

## 2016-12-04 DIAGNOSIS — M545 Low back pain, unspecified: Secondary | ICD-10-CM

## 2016-12-04 DIAGNOSIS — Z79899 Other long term (current) drug therapy: Secondary | ICD-10-CM | POA: Insufficient documentation

## 2016-12-04 DIAGNOSIS — E119 Type 2 diabetes mellitus without complications: Secondary | ICD-10-CM | POA: Diagnosis not present

## 2016-12-04 DIAGNOSIS — Z87891 Personal history of nicotine dependence: Secondary | ICD-10-CM | POA: Diagnosis not present

## 2016-12-04 DIAGNOSIS — Z9221 Personal history of antineoplastic chemotherapy: Secondary | ICD-10-CM | POA: Diagnosis not present

## 2016-12-04 LAB — URINALYSIS, ROUTINE W REFLEX MICROSCOPIC
BILIRUBIN URINE: NEGATIVE
Glucose, UA: NEGATIVE mg/dL
Hgb urine dipstick: NEGATIVE
KETONES UR: NEGATIVE mg/dL
LEUKOCYTES UA: NEGATIVE
NITRITE: NEGATIVE
Protein, ur: NEGATIVE mg/dL
Specific Gravity, Urine: 1.013 (ref 1.005–1.030)
pH: 7 (ref 5.0–8.0)

## 2016-12-04 NOTE — ED Triage Notes (Signed)
Pt complains of back pain and frequent urination Pt is currently on chemo and is concerned about getting thrush, she complains of a sore throat

## 2016-12-05 MED ORDER — IBUPROFEN 200 MG PO TABS
600.0000 mg | ORAL_TABLET | Freq: Once | ORAL | Status: AC
  Administered 2016-12-05: 600 mg via ORAL
  Filled 2016-12-05: qty 3

## 2016-12-05 MED ORDER — IBUPROFEN 600 MG PO TABS: 600.0000 mg | ORAL_TABLET | Freq: Three times a day (TID) | ORAL | 0 refills | Status: DC | PRN

## 2016-12-05 NOTE — ED Provider Notes (Signed)
[facility] DEPT Provider Note   CSN: [id number] Arrival date & time: 12/04/16  2015     History   Chief Complaint Chief Complaint  Patient presents with  . Back Pain    HPI Taylor Walsh is a 61 y.o. female.  HPI Patient is a 61 year old female with a history of breast cancer.  She states that she had a breast mass and end up with a positive lymph node and just initiated with chemotherapy.  She began having sharp intermittent low back pain today without radiation towards her abdomen or groin.  She denies radiating pain down into her legs.  No numbness or weakness in her legs.  No prior history of back pain.  She states this began acutely.  She did have some urinary frequency without dysuria.  No fevers and chills.  Denies nausea vomiting diarrhea.  No chest pain.  Abdominal pain.  No shortness of breath.  Symptoms are mild in severity.  She did not try any medication prior to arrival.   Past Medical History:  Diagnosis Date  . Anxiety   . Arthritis   . Cancer ([facility]) 10/08/2016   left breast cancer  . Diabetes mellitus without complication ([facility])   . Dyslipidemia   . Family history of breast cancer   . Family history of ovarian cancer   . Family history of prostate cancer   . GERD (gastroesophageal reflux disease)    nexium if needed  . Heart murmur   . Hypertension   . Hypothyroid   . Osteopenia   . Pneumonia 10/08/2016   June 2013    Patient Active Problem List   Diagnosis Date Noted  . DCIS (ductal carcinoma in situ) of breast 10/13/2016  . Genetic testing 08/10/2016  . Family history of breast cancer   . Family history of prostate cancer   . Family history of ovarian cancer   . Breast cancer of upper-inner quadrant of left female breast ([facility]) 07/22/2016  . Cigarette smoker 05/20/2016  . 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 Dyspnea 06/24/2015  . Nodule of left lung 02/25/2015  . Hypertension 01/28/2015  . Hypothyroidism 01/28/2015  . GERD  (gastroesophageal reflux disease) 01/28/2015  . Lung mass 01/07/2015    Past Surgical History:  Procedure Laterality Date  . ABDOMINAL HYSTERECTOMY    . BREAST RECONSTRUCTION WITH PLACEMENT OF TISSUE EXPANDER AND FLEX HD (ACELLULAR HYDRATED DERMIS) Left 10/13/2016   Procedure: LEFT BREAST RECONSTRUCTION WITH PLACEMENT OF TISSUE EXPANDER AND ALLODERM;  Surgeon: Irene Limbo, MD;  Location: [facility];  Service: Plastics;  Laterality: Left;  . FOOT SURGERY     bi-lat/ repaired hammer toes  . MASTECTOMY W/ SENTINEL NODE BIOPSY Left 10/13/2016  . MASTECTOMY W/ SENTINEL NODE BIOPSY Left 10/13/2016   Procedure: LEFT TOTAL MASTECTOMY WITH LEFT AXILLARY SENTINEL LYMPH NODE BIOPSY;  Surgeon: Coralie Keens, MD;  Location: [facility];  Service: General;  Laterality: Left;  . PORTACATH PLACEMENT Right 11/24/2016   Procedure: INSERTION PORT-A-CATH;  Surgeon: Fanny Skates, MD;  Location: [facility];  Service: General;  Laterality: Right;  . TUBAL LIGATION    . VIDEO ASSISTED THORACOSCOPY (VATS)/WEDGE RESECTION Left 02/25/2015   Procedure: VIDEO ASSISTED THORACOSCOPY (VATS)/ LUL WEDGE RESECTION with ON Q placement.;  Surgeon: Melrose Nakayama, MD;  Location: [facility];  Service: Thoracic;  Laterality: Left;    OB History    No data available       Home Medications    Prior to Admission medications  Medication Sig Start Date End Date Taking? Authorizing Provider  aspirin 81 MG tablet Take 81 mg by mouth daily.   Yes [provider]  atorvastatin (LIPITOR) 40 MG tablet Take 40 mg by mouth daily.   Yes [provider]  calcium-vitamin D 250-100 MG-UNIT tablet Take 2 tablets by mouth daily.    Yes [provider]  ergocalciferol (VITAMIN D2) 50000 UNITS capsule Take 50,000 Units by mouth every Friday.    Yes [provider]  HYDROcodone-acetaminophen (NORCO) 5-325 MG tablet Take 1-2 tablets by mouth every 6 (six) hours as needed for moderate pain or  severe pain. 11/24/16  Yes Fanny Skates, MD  levothyroxine (SYNTHROID, LEVOTHROID) 88 MCG tablet Take 88 mcg by mouth daily before breakfast.    Yes [provider]  lidocaine-prilocaine (EMLA) cream Apply to affected area once 11/20/16  Yes Truitt Merle, MD  methocarbamol (ROBAXIN) 500 MG tablet Take 1 tablet (500 mg total) by mouth every 8 (eight) hours as needed for muscle spasms. 10/14/16  Yes Irene Limbo, MD  olmesartan (BENICAR) 20 MG tablet Take 20 mg by mouth daily.   Yes [provider]  ondansetron (ZOFRAN) 8 MG tablet Take 1 tablet (8 mg total) by mouth 2 (two) times daily as needed. Start on the third day after chemotherapy. 11/20/16  Yes Truitt Merle, MD  prochlorperazine (COMPAZINE) 10 MG tablet Take 1 tablet (10 mg total) by mouth every 6 (six) hours as needed (Nausea or vomiting). 11/20/16  Yes Truitt Merle, MD  traMADol (ULTRAM) 50 MG tablet Take 50 mg by mouth every 6 (six) hours as needed for moderate pain or severe pain.  11/04/16  Yes [provider]  valACYclovir (VALTREX) 1000 MG tablet Take 1,000 mg by mouth daily as needed (for sores).    Yes [provider]  ibuprofen (ADVIL,MOTRIN) 600 MG tablet Take 1 tablet (600 mg total) by mouth every 8 (eight) hours as needed. 12/05/16   Jola Schmidt, MD    Family History Family History  Problem Relation Age of Onset  . Cancer Mother        brain, ovarian  . Hypertension Mother   . Stroke Father   . Hypertension Father   . Cancer Sister 79       bilateral breast cancer, 2nd cancer at 24  . Cancer Brother        dx with prostate cancer in the 51s  . Cancer Brother        dx in his 31s with prostate cancer  . Brain cancer Maternal Uncle   . Cirrhosis Brother   . Cancer Brother        NOS  . Cancer Paternal Aunt        NOS  . Cancer Cousin        paternal cousin with cancer NOS    Social History Social History  Substance Use Topics  . Smoking status: Former Smoker    Packs/day: 1.00     Years: 42.00    Quit date: 08/27/2016  . Smokeless tobacco: Never Used     Comment: 5/1/18she smokes occasionally when stressed. She is smoking 4 cigarettes a week  . Alcohol use No     Allergies   Vicodin [hydrocodone-acetaminophen]   Review of Systems Review of Systems  All other systems reviewed and are negative.    Physical Exam Updated Vital Signs BP (!) 145/94 (BP Location: Right Arm)   Pulse 86   Temp 98.6 F (37 C) (  Oral)   Resp 14   Ht 5\' 3"  (1.6 m)   Wt 92.8 kg (204 lb 9.6 oz)   SpO2 100%   BMI 36.24 kg/m   Physical Exam  Constitutional: She is oriented to person, place, and time. She appears well-developed and well-nourished.  HENT:  Head: Normocephalic.  Eyes: EOM are normal.  Neck: Normal range of motion.  Pulmonary/Chest: Effort normal.  Abdominal: She exhibits no distension.  Musculoskeletal: Normal range of motion.  No thoracic or lumbar midline tenderness.  No obvious spasm.  No signs of zoster.  No rash or erythema.  Full range of motion bilateral lower extremities with normal strength.  Neurological: She is alert and oriented to person, place, and time.  Psychiatric: She has a normal mood and affect.  Nursing note and vitals reviewed.    ED Treatments / Results  Labs (all labs ordered are listed, but only abnormal results are displayed) Labs Reviewed  URINALYSIS, ROUTINE W REFLEX MICROSCOPIC - Abnormal; Notable for the following:       Result Value   Color, Urine STRAW (*)    All other components within normal limits    EKG  EKG Interpretation None       Radiology No results found.  Procedures Procedures (including critical care time)  Medications Ordered in ED Medications  ibuprofen (ADVIL,MOTRIN) tablet 600 mg (not administered)     Initial Impression / Assessment and Plan / ED Course  I have reviewed the triage vital signs and the nursing notes.  Pertinent labs & imaging results that were available during my care of  the patient were reviewed by me and considered in my medical decision making (see chart for details).     Likely muscles: Low-back pain.  Urine without signs of infection.  Doubt ureteral colic.  Well-appearing.  No significant symptoms at this time.  Home with instructions for anti-inflammatories to the weekend and to return to the ER for new or worsening symptoms.  I do not think she needs advanced imaging of her abdomen and pelvis are back at this time.  Final Clinical Impressions(s) / ED Diagnoses   Final diagnoses:  Acute midline low back pain without sciatica    New Prescriptions New Prescriptions   IBUPROFEN (ADVIL,MOTRIN) 600 MG TABLET    Take 1 tablet (600 mg total) by mouth every 8 (eight) hours as needed.     Jola Schmidt, MD 12/05/16 [phone]

## 2016-12-05 NOTE — ED Notes (Signed)
Pt reports lower back pain Thursday, denies any injury.  Pt ambulates without difficulty.  Denies tingling or numbness in LE.  Denies incontinence of urine or bowels.

## 2016-12-09 ENCOUNTER — Encounter: Payer: Self-pay | Admitting: Physical Therapy

## 2016-12-09 ENCOUNTER — Ambulatory Visit: Payer: BC Managed Care – PPO | Admitting: Physical Therapy

## 2016-12-09 DIAGNOSIS — M6281 Muscle weakness (generalized): Secondary | ICD-10-CM

## 2016-12-09 DIAGNOSIS — R600 Localized edema: Secondary | ICD-10-CM

## 2016-12-09 DIAGNOSIS — M25512 Pain in left shoulder: Secondary | ICD-10-CM

## 2016-12-09 DIAGNOSIS — M25612 Stiffness of left shoulder, not elsewhere classified: Secondary | ICD-10-CM

## 2016-12-09 NOTE — Patient Instructions (Signed)

## 2016-12-09 NOTE — Therapy (Signed)
[place], [place], [address] Phone: [phone]   Fax:  [phone]  Physical Therapy Treatment  Patient Details  Name: Taylor Walsh MRN: [id number] Date of Birth: October 05, 1955 Referring Provider: Thimmappa  Encounter Date: 12/09/2016      PT End of Session - 12/09/16 1347    Visit Number 7   Number of Visits 16   Date for PT Re-Evaluation 01/06/17   PT Start Time 1302   PT Stop Time 1345   PT Time Calculation (min) 43 min   Activity Tolerance Patient tolerated treatment well   Behavior During Therapy WFL for tasks assessed/performed      Past Medical History:  Diagnosis Date  . Anxiety   . Arthritis   . Cancer ([facility]) 10/08/2016   left breast cancer  . Diabetes mellitus without complication ([facility])   . Dyslipidemia   . Family history of breast cancer   . Family history of ovarian cancer   . Family history of prostate cancer   . GERD (gastroesophageal reflux disease)    nexium if needed  . Heart murmur   . Hypertension   . Hypothyroid   . Osteopenia   . Pneumonia 10/08/2016   June 2013    Past Surgical History:  Procedure Laterality Date  . ABDOMINAL HYSTERECTOMY    . BREAST RECONSTRUCTION WITH PLACEMENT OF TISSUE EXPANDER AND FLEX HD (ACELLULAR HYDRATED DERMIS) Left 10/13/2016   Procedure: LEFT BREAST RECONSTRUCTION WITH PLACEMENT OF TISSUE EXPANDER AND ALLODERM;  Surgeon: Irene Limbo, MD;  Location: [facility];  Service: Plastics;  Laterality: Left;  . FOOT SURGERY     bi-lat/ repaired hammer toes  . MASTECTOMY W/ SENTINEL NODE BIOPSY Left 10/13/2016  . MASTECTOMY W/ SENTINEL NODE BIOPSY Left 10/13/2016   Procedure: LEFT TOTAL MASTECTOMY WITH LEFT AXILLARY SENTINEL LYMPH NODE BIOPSY;  Surgeon: Coralie Keens, MD;  Location: [facility];  Service: General;  Laterality: Left;  . PORTACATH PLACEMENT Right 11/24/2016   Procedure: INSERTION PORT-A-CATH;  Surgeon: Fanny Skates, MD;   Location: [facility];  Service: General;  Laterality: Right;  . TUBAL LIGATION    . VIDEO ASSISTED THORACOSCOPY (VATS)/WEDGE RESECTION Left 02/25/2015   Procedure: VIDEO ASSISTED THORACOSCOPY (VATS)/ LUL WEDGE RESECTION with ON Q placement.;  Surgeon: Melrose Nakayama, MD;  Location: [facility];  Service: Thoracic;  Laterality: Left;    There were no vitals filed for this visit.      Subjective Assessment - 12/09/16 1304    Subjective The Thursday after I was here I started having terrible back pain. It felt like when I was going to take a step my knees would buckle. I went to the emergency room and they gave me some Tylenol and sent me home.    Pertinent History Presented following screening MMG with new left breast calcifications. 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 MRI showed mass and NME spanning 10.2 cm from anterior to posterior, including both biopsied lesions. No abnormal or enlarged LN, and no evidence of malignancy in the right breast. Final pathology mastectomy 0.3 cm IDC, 5.5 cm high grade DCIS focally 0.1 cm from medial margin, 2/2 SLN +, +LVI. 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 Quit smoking 6 weeks prior to surgery., DCIS left breast high grade s/p left SRM 10/13/16, TE/ADM (Alloderm) reoconstruction, pt is not sure if she will require chemo or radiation  Patient Stated Goals to get arm back to where it used to be   Currently in Pain? No/denies   Pain Score 0-No pain            [facility] PT Assessment - 12/09/16 0001      AROM   Left Shoulder Flexion 142 Degrees   Left Shoulder ABduction 165 Degrees                     [facility] Adult PT Treatment/Exercise - 12/09/16 0001      Shoulder Exercises: Supine   Horizontal ABduction Strengthening;Both;10 reps;Theraband   Theraband Level (Shoulder Horizontal ABduction) Level 1 (Yellow)   External Rotation  Strengthening;Both;10 reps;Theraband   Theraband Level (Shoulder External Rotation) Level 1 (Yellow)   Flexion Strengthening;Both;10 reps;Theraband  narrow and wide grip   Other Supine Exercises D2 x 10 reps bilaterally with yellow theraband     Shoulder Exercises: Pulleys   Flexion 2 minutes   ABduction 2 minutes     Shoulder Exercises: Therapy Ball   Flexion 10 reps  with hold at end of stretch   ABduction 10 reps  on L with hold at end of stretch     Shoulder Exercises: Isometric Strengthening   Flexion 5X5"   Extension 5X5"   ABduction 5X5"  requires frequent verbal cues to perform correctly                        [facility] Goals - 12/09/16 1328      CC Long Term Goal  #1   Title Pt will be able to independently verbalize lymphedema risk reduction practices   Baseline 12/09/16- pt able to recall independently   Time 4   Period Weeks   Status Achieved     CC Long Term Goal  #2   Title Pt will be independent in a home exercise program for continued strengthening and stretching   Time 4   Period Weeks   Status On-going     CC Long Term Goal  #3   Title Pt will report a 75% improvement in left lateral trunk swelling to allow improved comfort when left arm is at side   Baseline 75% as of 12/01/16, though she still feels it some.   Time 4   Period Weeks   Status Achieved     CC Long Term Goal  #4   Title Pt will demonstrate 160 degrees of left shoulder abduction to allow pt to reach out to sides.   Baseline 101, 12/09/16- 165 degrees   Time 4   Period Weeks   Status Achieved     CC Long Term Goal  #5   Title Pt will demonstrate 160 degrees of left shoulder flexion to allow her to reach items overhead   Baseline 120, 12/09/16- 142 degrees   Time 4   Period Weeks   Status On-going            Plan - 12/09/16 1341    Clinical Impression Statement Pt got fitted for a compression sleeve last Tuesday and is awaiting it's arrival. Assessed  pt's progress towards goals in therapy today. She has met her abduction ROM and her lymphedema risk reduction goal. She is progressing towards her flexion goal. Will continue to see pt for ROM and strengthening and to progress pt towards independence with a home exericse program.    Rehab Potential Good   Clinical Impairments Affecting Rehab Potential pt.  has started chemotherapy   PT Frequency 2x / week   PT Duration 4 weeks   PT Treatment/Interventions ADLs/Self Care Home Management;Therapeutic exercise;Therapeutic activities;Patient/family education;Passive range of motion;Scar mobilization;Manual lymph drainage;Manual techniques;Taping   PT Next Visit Plan pulleys, ball, continue gentle AA/A/PROM to left shoulder, do MLD here if time allows, assess indep with supine scap   PT Home Exercise Plan supine scap exericses   Consulted and Agree with Plan of Care Patient      Patient will benefit from skilled therapeutic intervention in order to improve the following deficits and impairments:  Decreased knowledge of precautions, Increased edema, Decreased range of motion, Decreased strength, Postural dysfunction, Pain, Increased fascial restricitons  Visit Diagnosis: Stiffness of left shoulder, not elsewhere classified - Plan: PT plan of care cert/re-cert  Acute pain of left shoulder - Plan: PT plan of care cert/re-cert  Muscle weakness (generalized) - Plan: PT plan of care cert/re-cert  Localized edema - Plan: PT plan of care cert/re-cert     Problem List Patient Active Problem List   Diagnosis Date Noted  . DCIS (ductal carcinoma in situ) of breast 10/13/2016  . Genetic testing 08/10/2016  . Family history of breast cancer   . Family history of prostate cancer   . Family history of ovarian cancer   . Breast cancer of upper-inner quadrant of left female breast ([facility]) 07/22/2016  . Cigarette smoker 05/20/2016  . 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 Dyspnea 06/24/2015  . Nodule  of left lung 02/25/2015  . Hypertension 01/28/2015  . Hypothyroidism 01/28/2015  . GERD (gastroesophageal reflux disease) 01/28/2015  . Lung mass 01/07/2015    Allyson Sabal [facility] 12/09/2016, 4:29 PM  [place], [place], [address] Phone: [phone]   Fax:  [phone]  Name: Taylor Walsh MRN: [id number] Date of Birth: October 01, 1955  Manus Gunning, PT 12/09/16 4:29 PM

## 2016-12-10 ENCOUNTER — Encounter: Payer: Self-pay | Admitting: Oncology

## 2016-12-10 ENCOUNTER — Ambulatory Visit (HOSPITAL_BASED_OUTPATIENT_CLINIC_OR_DEPARTMENT_OTHER): Payer: BC Managed Care – PPO | Admitting: Oncology

## 2016-12-10 ENCOUNTER — Ambulatory Visit: Payer: BC Managed Care – PPO

## 2016-12-10 ENCOUNTER — Other Ambulatory Visit (HOSPITAL_BASED_OUTPATIENT_CLINIC_OR_DEPARTMENT_OTHER): Payer: BC Managed Care – PPO

## 2016-12-10 ENCOUNTER — Ambulatory Visit (HOSPITAL_BASED_OUTPATIENT_CLINIC_OR_DEPARTMENT_OTHER): Payer: BC Managed Care – PPO

## 2016-12-10 ENCOUNTER — Ambulatory Visit: Payer: BC Managed Care – PPO | Admitting: Nutrition

## 2016-12-10 VITALS — BP 138/80 | HR 88 | Temp 98.2°F | Resp 18 | Ht 63.0 in | Wt 200.7 lb

## 2016-12-10 DIAGNOSIS — Z170 Estrogen receptor positive status [ER+]: Secondary | ICD-10-CM

## 2016-12-10 DIAGNOSIS — R739 Hyperglycemia, unspecified: Secondary | ICD-10-CM | POA: Diagnosis not present

## 2016-12-10 DIAGNOSIS — C773 Secondary and unspecified malignant neoplasm of axilla and upper limb lymph nodes: Secondary | ICD-10-CM

## 2016-12-10 DIAGNOSIS — Z5189 Encounter for other specified aftercare: Secondary | ICD-10-CM | POA: Diagnosis not present

## 2016-12-10 DIAGNOSIS — Z5111 Encounter for antineoplastic chemotherapy: Secondary | ICD-10-CM

## 2016-12-10 DIAGNOSIS — C50212 Malignant neoplasm of upper-inner quadrant of left female breast: Secondary | ICD-10-CM

## 2016-12-10 LAB — CBC WITH DIFFERENTIAL/PLATELET
BASO%: 0.4 % (ref 0.0–2.0)
Basophils Absolute: 0 10*3/uL (ref 0.0–0.1)
EOS ABS: 0 10*3/uL (ref 0.0–0.5)
EOS%: 0.2 % (ref 0.0–7.0)
HCT: 37.2 % (ref 34.8–46.6)
HGB: 12.1 g/dL (ref 11.6–15.9)
LYMPH#: 1 10*3/uL (ref 0.9–3.3)
LYMPH%: 19.3 % (ref 14.0–49.7)
MCH: 27.8 pg (ref 25.1–34.0)
MCHC: 32.5 g/dL (ref 31.5–36.0)
MCV: 85.3 fL (ref 79.5–101.0)
MONO#: 0.8 10*3/uL (ref 0.1–0.9)
MONO%: 15.1 % — AB (ref 0.0–14.0)
NEUT%: 65 % (ref 38.4–76.8)
NEUTROS ABS: 3.5 10*3/uL (ref 1.5–6.5)
Platelets: 268 10*3/uL (ref 145–400)
RBC: 4.36 10*6/uL (ref 3.70–5.45)
RDW: 14.6 % — AB (ref 11.2–14.5)
WBC: 5.4 10*3/uL (ref 3.9–10.3)
nRBC: 0 % (ref 0–0)

## 2016-12-10 LAB — COMPREHENSIVE METABOLIC PANEL
ALT: 29 U/L (ref 0–55)
AST: 18 U/L (ref 5–34)
Albumin: 3.6 g/dL (ref 3.5–5.0)
Alkaline Phosphatase: 109 U/L (ref 40–150)
Anion Gap: 10 mEq/L (ref 3–11)
BUN: 8.9 mg/dL (ref 7.0–26.0)
CHLORIDE: 103 meq/L (ref 98–109)
CO2: 25 meq/L (ref 22–29)
CREATININE: 1 mg/dL (ref 0.6–1.1)
Calcium: 9.5 mg/dL (ref 8.4–10.4)
EGFR: 74 mL/min/{1.73_m2} — ABNORMAL LOW (ref >90)
Glucose: 219 mg/dl — ABNORMAL HIGH (ref 70–140)
Potassium: 4 mEq/L (ref 3.5–5.1)
SODIUM: 138 meq/L (ref 136–145)
Total Bilirubin: 0.28 mg/dL (ref 0.20–1.20)
Total Protein: 7.1 g/dL (ref 6.4–8.3)

## 2016-12-10 MED ORDER — SODIUM CHLORIDE 0.9% FLUSH
10.0000 mL | INTRAVENOUS | Status: DC | PRN
  Administered 2016-12-10: 10 mL
  Filled 2016-12-10: qty 10

## 2016-12-10 MED ORDER — PEGFILGRASTIM 6 MG/0.6ML ~~LOC~~ PSKT
6.0000 mg | PREFILLED_SYRINGE | Freq: Once | SUBCUTANEOUS | Status: AC
  Administered 2016-12-10: 6 mg via SUBCUTANEOUS
  Filled 2016-12-10: qty 0.6

## 2016-12-10 MED ORDER — METFORMIN HCL 500 MG PO TABS: 500.0000 mg | ORAL_TABLET | Freq: Two times a day (BID) | ORAL | 0 refills | Status: DC

## 2016-12-10 MED ORDER — SODIUM CHLORIDE 0.9 % IV SOLN
Freq: Once | INTRAVENOUS | Status: AC
  Administered 2016-12-10: 11:00:00 via INTRAVENOUS
  Filled 2016-12-10: qty 5

## 2016-12-10 MED ORDER — DOXORUBICIN HCL CHEMO IV INJECTION 2 MG/ML
60.0000 mg/m2 | Freq: Once | INTRAVENOUS | Status: AC
  Administered 2016-12-10: 120 mg via INTRAVENOUS
  Filled 2016-12-10: qty 60

## 2016-12-10 MED ORDER — SODIUM CHLORIDE 0.9 % IV SOLN
Freq: Once | INTRAVENOUS | Status: AC
  Administered 2016-12-10: 11:00:00 via INTRAVENOUS

## 2016-12-10 MED ORDER — SODIUM CHLORIDE 0.9 % IV SOLN
600.0000 mg/m2 | Freq: Once | INTRAVENOUS | Status: AC
  Administered 2016-12-10: 1200 mg via INTRAVENOUS
  Filled 2016-12-10: qty 60

## 2016-12-10 MED ORDER — PALONOSETRON HCL INJECTION 0.25 MG/5ML
0.2500 mg | Freq: Once | INTRAVENOUS | Status: AC
  Administered 2016-12-10: 0.25 mg via INTRAVENOUS

## 2016-12-10 MED ORDER — PALONOSETRON HCL INJECTION 0.25 MG/5ML
INTRAVENOUS | Status: AC
  Filled 2016-12-10: qty 5

## 2016-12-10 MED ORDER — HEPARIN SOD (PORK) LOCK FLUSH 100 UNIT/ML IV SOLN
500.0000 [IU] | Freq: Once | INTRAVENOUS | Status: AC | PRN
  Administered 2016-12-10: 500 [IU]
  Filled 2016-12-10: qty 5

## 2016-12-10 NOTE — Progress Notes (Signed)
Patient requested samples and coupons. Patient is a 61 year old female diagnosed with DCIS receiving chemotherapy. Patient reports taste alterations. Met briefly with patient and discussed oral nutrition supplements. Provided samples and coupons at patient's request. Provided education on taste alterations and provided patient with fact sheet and contact information. Questions were answered.  Teach back method used.  Patient will call me with any questions.  **Disclaimer: This note was dictated with voice recognition software. Similar sounding words can inadvertently be transcribed and this note may contain transcription errors which may not have been corrected upon publication of note.**

## 2016-12-10 NOTE — Assessment & Plan Note (Addendum)
Glucose noted to be elevated at 219 today. This is likely steroid induced. She is scheduled to get dexamethasone 12 mg IV as a premedication and we will continue this dose. The patient indicates that her primary care provider has been watching her for prediabetes. She states that she has a glucometer at home, but has not had diabetes education.  Explained to the patient that her glucose may go higher given that she is receiving dexamethasone with her chemotherapy today. She was encouraged to monitor her blood sugar closely.I will start her on metformin 500 mg twice a day. Side effects have been discussed with the patient and she is willing to take this medication. Prescription was sent to her local pharmacy. She was instructed to call for a follow-up with her primary care provider within one week for further evaluation of her elevated glucose. Further management will be deferred to her primary care provider.  Plan reviewed with Dr. Burr Medico.

## 2016-12-10 NOTE — Assessment & Plan Note (Signed)
Taylor Walsh is 61 y.o. who present to the clinic with Ductal carcinoma in situ (DCIS) of left breast here for evaluation prior to cycle 2 of her Adriamycin Cytoxan.  --MD discussed her 10/13/16 Surgical pathology findings and her mammaprint result in detail. - She had mostly DCIS in her breast but There was small component of invasive cancer that has spread to 2 of her lymph nodes.  Her surgical margins were negative. -Her mammaprint showed high-risk disease, luminal type B -Due to multiple positive lymph nodes, she underwent CT and bone scan to rule out any more positive lymph nodes or cancer that has spread anywhere else.  -MD discussed there was no obvious adenopathy on CT and no abnormalities on bone scan, so she will likely not need surgery.  -MD discussed the chemotherapy regimen and she will begin adjuvant dose dense Adriamycin and Cytoxan (A/C) every 2 weeks for 4 cycles beginning today. She will then have weekly taxol for 12 cycles. Due to positive lymph node she will get radiation afterward with Dr. Isidore Moos.  -We reviewed her CT Scan from 11/18/16 and she does not need re- excision for lymph node dissection. Her ECHO showed normal EF.  -She has recovered well from surgery, incision has healed well, lab reviewed, adequate for treatment. Tolerated her first cycle of chemotherapy well overall. Counts been reviewed and are adequate for treatment today. Recommend that she proceed with cycle 2 of her chemotherapy as scheduled today. She will receive OnPro with her chemotherapy. -she was instructed to use her antiemetics that she has at home. -FMLA papers were brought in today. Anticipate that we will keep her out of work through her Adriamycin and Cytoxan and then reevaluate at that time. Papers have been left for managed care for completion. -the patient was instructed to purchase her wig and bring the receipt to social work for partial reimbursement. -Follow-up visit will be in 2 weeks for  evaluation prior to cycle 3 of her Adriamycin and Cytoxan.

## 2016-12-10 NOTE — Patient Instructions (Addendum)
May remove Neulast On-Pro at 7pm on Friday 12/11/2016.   [facility] Discharge Instructions for Patients Receiving Chemotherapy  Today you received the following chemotherapy agents Adriamycin, Cytoxan and Neulasta.  To help prevent nausea and vomiting after your treatment, we encourage you to take your nausea medication as directed.   If you develop nausea and vomiting that is not controlled by your nausea medication, call the clinic.   BELOW ARE SYMPTOMS THAT SHOULD BE REPORTED IMMEDIATELY:  *FEVER GREATER THAN 100.5 F  *CHILLS WITH OR WITHOUT FEVER  NAUSEA AND VOMITING THAT IS NOT CONTROLLED WITH YOUR NAUSEA MEDICATION  *UNUSUAL SHORTNESS OF BREATH  *UNUSUAL BRUISING OR BLEEDING  TENDERNESS IN MOUTH AND THROAT WITH OR WITHOUT PRESENCE OF ULCERS  *URINARY PROBLEMS  *BOWEL PROBLEMS  UNUSUAL RASH Items with * indicate a potential emergency and should be followed up as soon as possible.  Feel free to call the clinic you have any questions or concerns. The clinic phone number is [phone].  Please show the [facility] at check-in to the Emergency Department and triage nurse.

## 2016-12-10 NOTE — Progress Notes (Signed)
[facility] Follow up:    Marton Redwood, MD [place] [address]   DIAGNOSIS: Cancer Staging Breast cancer of upper-inner quadrant of left female breast [facility]) Staging form: Breast, AJCC 8th Edition - Clinical stage from 07/10/2016: Stage 0 (cTis (DCIS), cN0, cM0, G3, ER: Positive, PR: Positive, HER2: Not Assessed) - Signed by Truitt Merle, MD on 07/28/2016 - Pathologic stage from 10/26/2016: Stage IA (pT1a, pN1a, cM0, G2, ER: Positive, PR: Positive, HER2: Negative) - Signed by Truitt Merle, MD on 11/11/2016   SUMMARY OF ONCOLOGIC HISTORY: Oncology History   Cancer Staging Breast cancer of upper-inner quadrant of left female breast [facility]) Staging form: Breast, AJCC 8th Edition - Clinical stage from 07/10/2016: Stage 0 (cTis (DCIS), cN0, cM0, G3, ER: Positive, PR: Positive, HER2: Not Assessed) - Signed by Truitt Merle, MD on 07/28/2016 - Pathologic stage from 10/26/2016: Stage IA (pT1a, pN1a, cM0, G2, ER: Positive, PR: Positive, HER2: Negative) - Signed by Truitt Merle, MD on 11/11/2016       Breast cancer of upper-inner quadrant of left female breast ([place])   07/10/2016 Initial Biopsy    Diagnosis 1. 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 2. 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      07/10/2016 Receptors her2    ER 95%, PR 80-90%+, both strong staining, Ki67 80-90%      07/22/2016 Initial Diagnosis    Ductal carcinoma in situ (DCIS) of left breast     07/27/2016 Imaging    Breast MRI w wo contrast showed  1. Extensive mass and non mass enhancement along the lower inner aspect the left breast, lower outer quadrant, spanning 10.2 cm from anterior to posterior, including both biopsied lesions, as detailed above. This is all consistent with  DCIS. 2. No other abnormal enhancement in the left breast. 3. No abnormal or enlarged axillary lymph nodes. 4. No evidence of malignancy in the right breast      08/10/2016 Genetic Testing    ATM [id number] VUS identified on the Common Hereditary Cancer panel. The Hereditary Gene Panel offered by [facility] includes sequencing and/or deletion duplication testing of the following 46 genes: APC, ATM, AXIN2, BARD1, BMPR1A, BRCA1, BRCA2, BRIP1, CDH1, CDKN2A (p14ARF), CDKN2A (p16INK4a), CHEK2, CTNNA1, DICER1, EPCAM (Deletion/duplication testing only), GREM1 (promoter region deletion/duplication testing only), KIT, MEN1, MLH1, MSH2, MSH3, MSH6, MUTYH, NBN, NF1, NHTL1, PALB2, PDGFRA, PMS2, POLD1, POLE, PTEN, RAD50, RAD51C, RAD51D, SDHB, SDHC, SDHD, SMAD4, SMARCA4. STK11, TP53, TSC1, TSC2, and VHL.  The following gene was evaluated for sequence changes only: SDHA and HOXB13 c.251G>A variant only.  The report date is Aug 09, 2016.       10/13/2016 Surgery    LEFT TOTAL MASTECTOMY WITH LEFT AXILLARY SENTINEL LYMPH NODE BIOPSY By Dr. Ninfa Linden      10/13/2016 Pathology Results    Surgery Diagnosis 10/13/16  1. Lymph node, sentinel, biopsy, Left Axillary - METASTATIC CARCINOMA IN ONE LYMPH NODE (1/1). 2. Lymph node, sentinel, biopsy, Left - METASTATIC CARCINOMA IN ONE LYMPH NODE (1/1). 3. Breast, radical mastectomy (including lymph nodes), Left - INVASIVE DUCTAL CARCINOMA, 0.3 CM. - LYMPHOVASCULAR INVOLVEMENT BY CARCINOMA. - HIGH GRADE DUCTAL CARCINOMA IN SITU WITH CALCIFICATIONS AND NECROSIS, 5.5 CM. - DCIS FOCALLY 0.1 CM FROM CAUTERIZED MEDIAL MARGIN. - SEE ONCOLOGY TABLE AND COMMENT. 4. Skin ,  Left additional mastectomy flap - BENIGN FIBROADIPOSE TISSUE. - NO EVIDENCE OF MALIGNANCY.      10/30/2016 Miscellaneous    MammaPrint 10/31/26 Result:  High Risk,Luminal type B MPI: -0.368 Average 10-year risk of recurrence untreated: 29%      11/18/2016 Imaging    CT CAP W Contrast 11/18/16 IMPRESSION: 1.  Skin thickening and interstitial changes involving the left breast, likely related to radiation. Surgical changes are also noted with a tissue expander in place. Probable postop fluid collection in the upper outer quadrant breast. No enlarged supraclavicular or axillary lymph nodes. 2. No CT findings suggesting metastatic disease involving the chest, abdomen or pelvis or bony structures. 3. Surgical changes from a left upper lobe wedge resection. No findings for recurrent tumor.      11/23/2016 Imaging    Bone Scan Whole body 11/23/16 IMPRESSION: No definite scintigraphic evidence of osseous metastatic disease as above.      11/25/2016 -  Chemotherapy    Adjuvant chemotherapy AC every 2 weeks for 4 cycles followed by Taxol weekly for 12 cycles starting 11/25/16        CURRENT THERAPY: Adjuvant chemotherapy AC every 2 weeks for 4 cycles followed by Taxol weekly for 12 cycles starting 11/25/16  INTERVAL HISTORY: Taylor Walsh 61 y.o. female returns for routine follow-up by herself. Tolerated her first cycle of chemotherapy well. She reports some mild nausea several days following her chemotherapy, but no vomiting. She has noticed some taste changes and some hyperpigmentation of her tongue. States that the hyperpigmentation was present previously, but is more noticeable now. Had back pain last week, and was advised to go to the emergency room over concerns of a kidney stone. She was diagnosed with a muscle strain and was placed and ibuprofen. Denies back pain today. Denies fevers and chills. Denies chest pain, shortness breath, cough, hemoptysis. Denies constipation and diarrhea. The patient has FMLA paperwork needs to be filled out today. She also has questions about her wig prescription. The patient is here today for evaluation prior to cycle 2 of her chemotherapy.   Patient Active Problem List   Diagnosis Date Noted  . Encounter for antineoplastic chemotherapy 12/10/2016  . Hyperglycemia  12/10/2016  . DCIS (ductal carcinoma in situ) of breast 10/13/2016  . Genetic testing 08/10/2016  . Family history of breast cancer   . Family history of prostate cancer   . Family history of ovarian cancer   . Breast cancer of upper-inner quadrant of left female breast ([place]) 07/22/2016  . Cigarette smoker 05/20/2016  . 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 Dyspnea 06/24/2015  . Nodule of left lung 02/25/2015  . Hypertension 01/28/2015  . Hypothyroidism 01/28/2015  . GERD (gastroesophageal reflux disease) 01/28/2015  . Lung mass 01/07/2015    is allergic to vicodin [hydrocodone-acetaminophen].  MEDICAL HISTORY: Past Medical History:  Diagnosis Date  . Anxiety   . Arthritis   . Cancer ([place]) 10/08/2016   left breast cancer  . Diabetes mellitus without complication ([place])   . Dyslipidemia   . Family history of breast cancer   . Family history of ovarian cancer   . Family history of prostate cancer   . GERD (gastroesophageal reflux disease)    nexium if needed  . Heart murmur   . Hypertension   . Hypothyroid   . Osteopenia   . Pneumonia 10/08/2016   June 2013    SURGICAL HISTORY: Past Surgical History:  Procedure Laterality Date  . ABDOMINAL HYSTERECTOMY    .  BREAST RECONSTRUCTION WITH PLACEMENT OF TISSUE EXPANDER AND FLEX HD (ACELLULAR HYDRATED DERMIS) Left 10/13/2016   Procedure: LEFT BREAST RECONSTRUCTION WITH PLACEMENT OF TISSUE EXPANDER AND ALLODERM;  Surgeon: Irene Limbo, MD;  Location: [facility];  Service: Plastics;  Laterality: Left;  . FOOT SURGERY     bi-lat/ repaired hammer toes  . MASTECTOMY W/ SENTINEL NODE BIOPSY Left 10/13/2016  . MASTECTOMY W/ SENTINEL NODE BIOPSY Left 10/13/2016   Procedure: LEFT TOTAL MASTECTOMY WITH LEFT AXILLARY SENTINEL LYMPH NODE BIOPSY;  Surgeon: Coralie Keens, MD;  Location: [facility];  Service: General;  Laterality: Left;  . PORTACATH PLACEMENT Right 11/24/2016   Procedure: INSERTION PORT-A-CATH;  Surgeon: Fanny Skates,  MD;  Location: [facility];  Service: General;  Laterality: Right;  . TUBAL LIGATION    . VIDEO ASSISTED THORACOSCOPY (VATS)/WEDGE RESECTION Left 02/25/2015   Procedure: VIDEO ASSISTED THORACOSCOPY (VATS)/ LUL WEDGE RESECTION with ON Q placement.;  Surgeon: Melrose Nakayama, MD;  Location: [facility];  Service: Thoracic;  Laterality: Left;    SOCIAL HISTORY: Social History   Social History  . Marital status: Single    Spouse name: N/A  . Number of children: 2  . Years of education: N/A   Occupational History  . Not on file.   Social History Main Topics  . Smoking status: Former Smoker    Packs/day: 1.00    Years: 42.00    Quit date: 08/27/2016  . Smokeless tobacco: Never Used     Comment: 5/1/18she smokes occasionally when stressed. She is smoking 4 cigarettes a week  . Alcohol use No  . Drug use: No  . Sexual activity: Not on file   Other Topics Concern  . Not on file   Social History Narrative  . No narrative on file    FAMILY HISTORY: Family History  Problem Relation Age of Onset  . Cancer Mother        brain, ovarian  . Hypertension Mother   . Stroke Father   . Hypertension Father   . Cancer Sister 50       bilateral breast cancer, 2nd cancer at 35  . Cancer Brother        dx with prostate cancer in the 32s  . Cancer Brother        dx in his 71s with prostate cancer  . Brain cancer Maternal Uncle   . Cirrhosis Brother   . Cancer Brother        NOS  . Cancer Paternal Aunt        NOS  . Cancer Cousin        paternal cousin with cancer NOS    Review of Systems  Constitutional: Negative.   HENT:   Negative for mouth sores and sore throat.   Eyes: Negative.   Respiratory: Negative.   Cardiovascular: Negative.   Gastrointestinal: Negative.        Had nausea several days following her chemotherapy, but no vomiting. Denies nausea today.  Endocrine: Negative.   Genitourinary: Negative.    Musculoskeletal: Negative.   Skin: Negative.    Neurological: Negative.   Hematological: Negative.   Psychiatric/Behavioral: Negative.       PHYSICAL EXAMINATION  ECOG PERFORMANCE STATUS: 1 - Symptomatic but completely ambulatory  Vitals:   12/10/16 1005  BP: 138/80  Pulse: 88  Resp: 18  Temp: 98.2 F (36.8 C)  SpO2: 100%    Physical Exam  Constitutional: She is oriented to person, place, and time and well-developed,  well-nourished, and in no distress. No distress.  HENT:  Head: Normocephalic and atraumatic.  Mouth/Throat: Oropharynx is clear and moist. No oropharyngeal exudate.  Hyperpigmentation on her tongue. No thrush.  Eyes: Conjunctivae are normal. Right eye exhibits no discharge. Left eye exhibits no discharge. No scleral icterus.  Neck: Normal range of motion. Neck supple.  Cardiovascular: Normal rate, regular rhythm, normal heart sounds and intact distal pulses.   Pulmonary/Chest: Effort normal and breath sounds normal. No respiratory distress. She has no wheezes. She has no rales.  Abdominal: Soft. Bowel sounds are normal. She exhibits no distension and no mass. There is no tenderness.  Musculoskeletal: Normal range of motion. She exhibits no edema.  Lymphadenopathy:    She has no cervical adenopathy.  Neurological: She is alert and oriented to person, place, and time. She exhibits normal muscle tone. Gait normal.  Skin: Skin is warm and dry. No rash noted. She is not diaphoretic. No erythema. No pallor.  Psychiatric: Mood, memory, affect and judgment normal.  Vitals reviewed.   LABORATORY DATA:  CBC    Component Value Date/Time   WBC 5.4 12/10/2016 0924   WBC 5.3 10/08/2016 1315   RBC 4.36 12/10/2016 0924   RBC 4.71 10/08/2016 1315   HGB 12.1 12/10/2016 0924   HCT 37.2 12/10/2016 0924   PLT 268 12/10/2016 0924   MCV 85.3 12/10/2016 0924   MCH 27.8 12/10/2016 0924   MCH 28.9 10/08/2016 1315   MCHC 32.5 12/10/2016 0924   MCHC 33.9 10/08/2016 1315   RDW 14.6 (H) 12/10/2016 0924   LYMPHSABS 1.0  12/10/2016 0924   MONOABS 0.8 12/10/2016 0924   EOSABS 0.0 12/10/2016 0924   BASOSABS 0.0 12/10/2016 0924    CMP     Component Value Date/Time   NA 138 12/10/2016 0924   K 4.0 12/10/2016 0924   CL 105 10/08/2016 1315   CO2 25 12/10/2016 0924   GLUCOSE 219 (H) 12/10/2016 0924   BUN 8.9 12/10/2016 0924   CREATININE 1.0 12/10/2016 0924   CALCIUM 9.5 12/10/2016 0924   PROT 7.1 12/10/2016 0924   ALBUMIN 3.6 12/10/2016 0924   AST 18 12/10/2016 0924   ALT 29 12/10/2016 0924   ALKPHOS 109 12/10/2016 0924   BILITOT 0.28 12/10/2016 0924   GFRNONAA >60 10/08/2016 1315   GFRAA >60 10/08/2016 1315    RADIOGRAPHIC STUDIES:  No results found.  ASSESSMENT and THERAPY PLAN:   Breast cancer of upper-inner quadrant of left female breast ([place]) Taylor Walsh is 61 y.o. who present to the clinic with Ductal carcinoma in situ (DCIS) of left breast here for evaluation prior to cycle 2 of her Adriamycin Cytoxan.  --MD discussed her 10/13/16 Surgical pathology findings and her mammaprint result in detail. - She had mostly DCIS in her breast but There was small component of invasive cancer that has spread to 2 of her lymph nodes.  Her surgical margins were negative. -Her mammaprint showed high-risk disease, luminal type B -Due to multiple positive lymph nodes, she underwent CT and bone scan to rule out any more positive lymph nodes or cancer that has spread anywhere else.  -MD discussed there was no obvious adenopathy on CT and no abnormalities on bone scan, so she will likely not need surgery.  -MD discussed the chemotherapy regimen and she will begin adjuvant dose dense Adriamycin and Cytoxan (A/C) every 2 weeks for 4 cycles beginning today. She will then have weekly taxol for 12 cycles. Due to positive lymph node she  will get radiation afterward with Dr. Isidore Moos.  -We reviewed her CT Scan from 11/18/16 and she does not need re- excision for lymph node dissection. Her ECHO showed normal EF.  -She has  recovered well from surgery, incision has healed well, lab reviewed, adequate for treatment. Tolerated her first cycle of chemotherapy well overall. Counts been reviewed and are adequate for treatment today. Recommend that she proceed with cycle 2 of her chemotherapy as scheduled today. She will receive OnPro with her chemotherapy. -she was instructed to use her antiemetics that she has at home. -FMLA papers were brought in today. Anticipate that we will keep her out of work through her Adriamycin and Cytoxan and then reevaluate at that time. Papers have been left for managed care for completion. -the patient was instructed to purchase her wig and bring the receipt to social work for partial reimbursement. -Follow-up visit will be in 2 weeks for evaluation prior to cycle 3 of her Adriamycin and Cytoxan.  Hyperglycemia Glucose noted to be elevated at 219 today. This is likely steroid induced. She is scheduled to get dexamethasone 12 mg IV as a premedication and we will continue this dose. The patient indicates that her primary care provider has been watching her for prediabetes. She states that she has a glucometer at home, but has not had diabetes education.  Explained to the patient that her glucose may go higher given that she is receiving dexamethasone with her chemotherapy today. She was encouraged to monitor her blood sugar closely.I will start her on metformin 500 mg twice a day. Side effects have been discussed with the patient and she is willing to take this medication. Prescription was sent to her local pharmacy. She was instructed to call for a follow-up with her primary care provider within one week for further evaluation of her elevated glucose. Further management will be deferred to her primary care provider.  Plan reviewed with Dr. Burr Medico.   No orders of the defined types were placed in this encounter.   I spent 25 minutes counseling the patient face to face. The total time spent in the  appointment was 30 minutes and more than 50% was on counseling. All questions were answered. The patient knows to call the clinic with any problems, questions or concerns. We can certainly see the patient much sooner if necessary.  Mikey Bussing, NP 12/10/2016

## 2016-12-11 ENCOUNTER — Ambulatory Visit: Payer: BC Managed Care – PPO | Admitting: Physical Therapy

## 2016-12-11 ENCOUNTER — Encounter: Payer: Self-pay | Admitting: Physical Therapy

## 2016-12-11 DIAGNOSIS — M25612 Stiffness of left shoulder, not elsewhere classified: Secondary | ICD-10-CM | POA: Diagnosis not present

## 2016-12-11 DIAGNOSIS — M6281 Muscle weakness (generalized): Secondary | ICD-10-CM

## 2016-12-11 DIAGNOSIS — M25512 Pain in left shoulder: Secondary | ICD-10-CM

## 2016-12-11 NOTE — Therapy (Signed)
[place], [place], [address] Phone: [phone]   Fax:  [phone]  Physical Therapy Treatment  Patient Details  Name: Taylor Walsh MRN: [id number] Date of Birth: 1956/03/04 Referring Provider: Thimmappa  Encounter Date: 12/11/2016      PT End of Session - 12/11/16 1150    Visit Number 8   Number of Visits 16   Date for PT Re-Evaluation 01/06/17   PT Start Time 1109  pt arrived late   PT Stop Time 1147   PT Time Calculation (min) 38 min   Activity Tolerance Patient tolerated treatment well   Behavior During Therapy WFL for tasks assessed/performed      Past Medical History:  Diagnosis Date  . Anxiety   . Arthritis   . Cancer ([place]) 10/08/2016   left breast cancer  . Diabetes mellitus without complication ([place])   . Dyslipidemia   . Family history of breast cancer   . Family history of ovarian cancer   . Family history of prostate cancer   . GERD (gastroesophageal reflux disease)    nexium if needed  . Heart murmur   . Hypertension   . Hypothyroid   . Osteopenia   . Pneumonia 10/08/2016   June 2013    Past Surgical History:  Procedure Laterality Date  . ABDOMINAL HYSTERECTOMY    . BREAST RECONSTRUCTION WITH PLACEMENT OF TISSUE EXPANDER AND FLEX HD (ACELLULAR HYDRATED DERMIS) Left 10/13/2016   Procedure: LEFT BREAST RECONSTRUCTION WITH PLACEMENT OF TISSUE EXPANDER AND ALLODERM;  Surgeon: Irene Limbo, MD;  Location: [facility];  Service: Plastics;  Laterality: Left;  . FOOT SURGERY     bi-lat/ repaired hammer toes  . MASTECTOMY W/ SENTINEL NODE BIOPSY Left 10/13/2016  . MASTECTOMY W/ SENTINEL NODE BIOPSY Left 10/13/2016   Procedure: LEFT TOTAL MASTECTOMY WITH LEFT AXILLARY SENTINEL LYMPH NODE BIOPSY;  Surgeon: Coralie Keens, MD;  Location: [facility];  Service: General;  Laterality: Left;  . PORTACATH PLACEMENT Right 11/24/2016   Procedure: INSERTION PORT-A-CATH;  Surgeon: Fanny Skates, MD;  Location: [facility];  Service: General;  Laterality: Right;  . TUBAL LIGATION    . VIDEO ASSISTED THORACOSCOPY (VATS)/WEDGE RESECTION Left 02/25/2015   Procedure: VIDEO ASSISTED THORACOSCOPY (VATS)/ LUL WEDGE RESECTION with ON Q placement.;  Surgeon: Melrose Nakayama, MD;  Location: [facility];  Service: Thoracic;  Laterality: Left;    There were no vitals filed for this visit.      Subjective Assessment - 12/11/16 1111    Subjective I didn't want to miss today but I had chemo yesterday and I can not do a lot standing because I started feeling bad.    Pertinent History Presented following screening MMG with new left breast calcifications. 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 MRI showed mass and NME spanning 10.2 cm from anterior to posterior, including both biopsied lesions. No abnormal or enlarged LN, and no evidence of malignancy in the right breast. Final pathology mastectomy 0.3 cm IDC, 5.5 cm high grade DCIS focally 0.1 cm from medial margin, 2/2 SLN +, +LVI. 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 No/denies   Pain Score 0-No pain                         [facility] Adult PT Treatment/Exercise - 12/11/16 0001      Shoulder Exercises: Supine   Horizontal ABduction Strengthening;Both;10 reps;Theraband   Theraband Level (Shoulder Horizontal ABduction) Level 1 (Yellow)   External Rotation Strengthening;Both;10 reps;Theraband   Theraband Level (Shoulder External Rotation) Level 1 (Yellow)   Flexion Strengthening;Both;10 reps;Theraband  narrow and wide grip   Theraband Level (Shoulder Flexion) Level 1  (Yellow)   Other Supine Exercises D2 x 10 reps bilaterally with yellow theraband     Shoulder Exercises: Pulleys   Flexion 2 minutes   ABduction 2 minutes     Manual Therapy   Manual Therapy Passive ROM   Passive ROM to left shoulder in direction of flexion, horizontal abduction, and abduction                        Long Term Clinic Goals - 12/09/16 1328      CC Long Term Goal  #1   Title Pt will be able to independently verbalize lymphedema risk reduction practices   Baseline 12/09/16- pt able to recall independently   Time 4   Period Weeks   Status Achieved     CC Long Term Goal  #2   Title Pt will be independent in a home exercise program for continued strengthening and stretching   Time 4   Period Weeks   Status On-going     CC Long Term Goal  #3   Title Pt will report a 75% improvement in left lateral trunk swelling to allow improved comfort when left arm is at side   Baseline 75% as of 12/01/16, though she still feels it some.   Time 4   Period Weeks   Status Achieved     CC Long Term Goal  #4   Title Pt will demonstrate 160 degrees of left shoulder abduction to allow pt to reach out to sides.   Baseline 101, 12/09/16- 165 degrees   Time 4   Period Weeks   Status Achieved     CC Long Term Goal  #5   Title Pt will demonstrate 160 degrees of left shoulder flexion to allow her to reach items overhead   Baseline 120, 12/09/16- 142 degrees   Time 4   Period Weeks   Status On-going            Plan - 12/11/16 1150    Clinical Impression Statement Pt not feeling well after chemotherapy yesterday and wanted to focus on things lying down. Continued educating pt in supine scap exercises which pt has been doing at home. Continued with PROM with prolonged stretches to left shoulder.    Rehab Potential Good   Clinical Impairments Affecting Rehab Potential pt. has started chemotherapy   PT Frequency 2x / week   PT Duration 4 weeks   PT  Treatment/Interventions ADLs/Self Care Home Management;Therapeutic exercise;Therapeutic activities;Patient/family education;Passive range of motion;Scar mobilization;Manual lymph drainage;Manual techniques;Taping   PT Next Visit Plan pulleys, ball, continue gentle AA/A/PROM to left shoulder, do MLD here if time allows, add to HEP   PT Home Exercise Plan supine scap exericses   Consulted and Agree with Plan of Care Patient      Patient will benefit from skilled therapeutic intervention in order to improve the following deficits and impairments:  Decreased knowledge of precautions, Increased edema, Decreased range of motion, Decreased strength, Postural dysfunction, Pain, Increased fascial restricitons  Visit Diagnosis: Stiffness of left shoulder, not elsewhere classified  Acute pain of left shoulder  Muscle weakness (generalized)     Problem List Patient Active Problem List   Diagnosis Date Noted  . Encounter for antineoplastic chemotherapy 12/10/2016  . Hyperglycemia 12/10/2016  . DCIS (ductal carcinoma in situ) of breast 10/13/2016  . Genetic testing 08/10/2016  . Family history of breast cancer   . Family history of prostate cancer   . Family history of ovarian cancer   . Breast cancer of upper-inner quadrant of left female breast ([place]) 07/22/2016  . Cigarette smoker 05/20/2016  . 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 Dyspnea 06/24/2015  . Nodule of left lung 02/25/2015  . Hypertension 01/28/2015  . Hypothyroidism 01/28/2015  . GERD (gastroesophageal reflux disease) 01/28/2015  . Lung mass 01/07/2015    Allyson Sabal [facility] 12/11/2016, 11:52 AM  [place], [place], [address] Phone: [phone]   Fax:  [phone]  Name: Taylor Walsh MRN: [id number] Date of Birth: 1955/09/24  Manus Gunning, PT 12/11/16 11:52 AM

## 2016-12-14 ENCOUNTER — Ambulatory Visit: Payer: BC Managed Care – PPO | Admitting: Physical Therapy

## 2016-12-16 ENCOUNTER — Ambulatory Visit: Payer: BC Managed Care – PPO | Admitting: Physical Therapy

## 2016-12-16 DIAGNOSIS — M25612 Stiffness of left shoulder, not elsewhere classified: Secondary | ICD-10-CM | POA: Diagnosis not present

## 2016-12-16 DIAGNOSIS — M25512 Pain in left shoulder: Secondary | ICD-10-CM

## 2016-12-16 NOTE — Therapy (Signed)
[place], [place], [address] Phone: [phone]   Fax:  [phone]  Physical Therapy Treatment  Patient Details  Name: Taylor Walsh MRN: [id number] Date of Birth: [birth year] Referring Provider: Thimmappa  Encounter Date: 12/16/2016      PT End of Session - 12/16/16 1243    Visit Number 9   Number of Visits 16   Date for PT Re-Evaluation 01/06/17   PT Start Time 0938   PT Stop Time 1018   PT Time Calculation (min) 40 min   Activity Tolerance Patient tolerated treatment well   Behavior During Therapy [facility] for tasks assessed/performed      Past Medical History:  Diagnosis Date  . Anxiety   . Arthritis   . Cancer ([place]) 10/08/2016   left breast cancer  . Diabetes mellitus without complication ([place])   . Dyslipidemia   . Family history of breast cancer   . Family history of ovarian cancer   . Family history of prostate cancer   . GERD (gastroesophageal reflux disease)    nexium if needed  . Heart murmur   . Hypertension   . Hypothyroid   . Osteopenia   . Pneumonia 10/08/2016   June 2013    Past Surgical History:  Procedure Laterality Date  . ABDOMINAL HYSTERECTOMY    . BREAST RECONSTRUCTION WITH PLACEMENT OF TISSUE EXPANDER AND FLEX HD (ACELLULAR HYDRATED DERMIS) Left 10/13/2016   Procedure: LEFT BREAST RECONSTRUCTION WITH PLACEMENT OF TISSUE EXPANDER AND ALLODERM;  Surgeon: Irene Limbo, MD;  Location: [facility];  Service: Plastics;  Laterality: Left;  . FOOT SURGERY     bi-lat/ repaired hammer toes  . MASTECTOMY W/ SENTINEL NODE BIOPSY Left 10/13/2016  . MASTECTOMY W/ SENTINEL NODE BIOPSY Left 10/13/2016   Procedure: LEFT TOTAL MASTECTOMY WITH LEFT AXILLARY SENTINEL LYMPH NODE BIOPSY;  Surgeon: Coralie Keens, MD;  Location: [facility];  Service: General;  Laterality: Left;  . PORTACATH PLACEMENT Right 11/24/2016   Procedure: INSERTION PORT-A-CATH;  Surgeon: Fanny Skates, MD;   Location: [facility];  Service: General;  Laterality: Right;  . TUBAL LIGATION    . VIDEO ASSISTED THORACOSCOPY (VATS)/WEDGE RESECTION Left 02/25/2015   Procedure: VIDEO ASSISTED THORACOSCOPY (VATS)/ LUL WEDGE RESECTION with ON Q placement.;  Surgeon: Melrose Nakayama, MD;  Location: [facility];  Service: Thoracic;  Laterality: Left;    There were no vitals filed for this visit.      Subjective Assessment - 12/16/16 0940    Subjective This chemo hit me really hard.  I couldn't have come Monday.  Still feeling bad.   Currently in Pain? No/denies  just feel [facility] Adult PT Treatment/Exercise - 12/16/16 0001      Shoulder Exercises: Seated   Other Seated Exercises backward shoulder rolls at end of session     Manual Therapy   Manual Therapy Myofascial release;Scapular mobilization   Myofascial Release in supine: left UE myofascial pulling with movement into abduction; release from left upper arm to right abdomen in diagonal; crosshands from left shoulder to left mid-flank. Also in right sidelying, from left upper arm to left lower flank.   Scapular Mobilization in right sidelying to left scapula with rhythmic movements into protraction and depression   Passive ROM in supine to left shoulder for ir, er, abduction, and flexion with stretch  to tolerance; also for horizontal abduction  er tight today but stretched to 90 degrees slowly                        Long Term Clinic Goals - 12/09/16 1328      CC Long Term Goal  #1   Title Pt will be able to independently verbalize lymphedema risk reduction practices   Baseline 12/09/16- pt able to recall independently   Time 4   Period Weeks   Status Achieved     CC Long Term Goal  #2   Title Pt will be independent in a home exercise program for continued strengthening and stretching   Time 4   Period Weeks   Status On-going     CC Long Term Goal  #3   Title Pt  will report a 75% improvement in left lateral trunk swelling to allow improved comfort when left arm is at side   Baseline 75% as of 12/01/16, though she still feels it some.   Time 4   Period Weeks   Status Achieved     CC Long Term Goal  #4   Title Pt will demonstrate 160 degrees of left shoulder abduction to allow pt to reach out to sides.   Baseline 101, 12/09/16- 165 degrees   Time 4   Period Weeks   Status Achieved     CC Long Term Goal  #5   Title Pt will demonstrate 160 degrees of left shoulder flexion to allow her to reach items overhead   Baseline 120, 12/09/16- 142 degrees   Time 4   Period Weeks   Status On-going            Plan - 12/16/16 1243    Clinical Impression Statement Pt. still not feeling well after last chemo session, so focus was on passive manual therapy today.  She did well with this.   Rehab Potential Good   Clinical Impairments Affecting Rehab Potential pt. has started chemotherapy   PT Frequency 2x / week   PT Duration 4 weeks   PT Treatment/Interventions ADLs/Self Care Home Management;Therapeutic exercise;Therapeutic activities;Patient/family education;Passive range of motion;Scar mobilization;Manual lymph drainage;Manual techniques;Taping   PT Next Visit Plan pulleys, ball, continue gentle AA/A/PROM to left shoulder, do MLD here if time allows, add to HEP   PT Home Exercise Plan supine scap exericses   Consulted and Agree with Plan of Care Patient      Patient will benefit from skilled therapeutic intervention in order to improve the following deficits and impairments:  Decreased knowledge of precautions, Increased edema, Decreased range of motion, Decreased strength, Postural dysfunction, Pain, Increased fascial restricitons  Visit Diagnosis: Stiffness of left shoulder, not elsewhere classified  Acute pain of left shoulder     Problem List Patient Active Problem List   Diagnosis Date Noted  . Encounter for antineoplastic chemotherapy  12/10/2016  . Hyperglycemia 12/10/2016  . DCIS (ductal carcinoma in situ) of breast 10/13/2016  . Genetic testing 08/10/2016  . Family history of breast cancer   . Family history of prostate cancer   . Family history of ovarian cancer   . Breast cancer of upper-inner quadrant of left female breast ([place]) 07/22/2016  . Cigarette smoker 05/20/2016  . 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 Dyspnea 06/24/2015  . Nodule of left lung 02/25/2015  . Hypertension 01/28/2015  . Hypothyroidism 01/28/2015  . GERD (gastroesophageal reflux disease) 01/28/2015  . Lung mass 01/07/2015  [facility] 12/16/2016, 12:47 PM  [place], [place], [address] Phone: [phone]   Fax:  [phone]  Name: LETINA LUCKETT MRN: [id number] Date of Birth: 01/31/1956  Serafina Royals, PT 12/16/16 12:47 PM

## 2016-12-23 ENCOUNTER — Other Ambulatory Visit (HOSPITAL_BASED_OUTPATIENT_CLINIC_OR_DEPARTMENT_OTHER): Payer: BC Managed Care – PPO

## 2016-12-23 ENCOUNTER — Ambulatory Visit: Payer: BC Managed Care – PPO

## 2016-12-23 ENCOUNTER — Encounter: Payer: Self-pay | Admitting: Hematology

## 2016-12-23 ENCOUNTER — Ambulatory Visit (HOSPITAL_BASED_OUTPATIENT_CLINIC_OR_DEPARTMENT_OTHER): Payer: BC Managed Care – PPO | Admitting: Hematology

## 2016-12-23 ENCOUNTER — Ambulatory Visit (HOSPITAL_BASED_OUTPATIENT_CLINIC_OR_DEPARTMENT_OTHER): Payer: BC Managed Care – PPO

## 2016-12-23 VITALS — BP 125/73 | HR 96 | Temp 98.3°F | Resp 18 | Ht 63.0 in | Wt 198.4 lb

## 2016-12-23 DIAGNOSIS — Z5111 Encounter for antineoplastic chemotherapy: Secondary | ICD-10-CM | POA: Diagnosis not present

## 2016-12-23 DIAGNOSIS — C50212 Malignant neoplasm of upper-inner quadrant of left female breast: Secondary | ICD-10-CM

## 2016-12-23 DIAGNOSIS — M8580 Other specified disorders of bone density and structure, unspecified site: Secondary | ICD-10-CM | POA: Diagnosis not present

## 2016-12-23 DIAGNOSIS — Z5189 Encounter for other specified aftercare: Secondary | ICD-10-CM

## 2016-12-23 DIAGNOSIS — Z170 Estrogen receptor positive status [ER+]: Principal | ICD-10-CM

## 2016-12-23 DIAGNOSIS — R05 Cough: Secondary | ICD-10-CM

## 2016-12-23 LAB — CBC WITH DIFFERENTIAL/PLATELET
BASO%: 0.3 % (ref 0.0–2.0)
BASOS ABS: 0 10*3/uL (ref 0.0–0.1)
EOS%: 0.7 % (ref 0.0–7.0)
Eosinophils Absolute: 0.1 10*3/uL (ref 0.0–0.5)
HEMATOCRIT: 35.1 % (ref 34.8–46.6)
HEMOGLOBIN: 11.7 g/dL (ref 11.6–15.9)
LYMPH#: 0.8 10*3/uL — AB (ref 0.9–3.3)
LYMPH%: 9.9 % — ABNORMAL LOW (ref 14.0–49.7)
MCH: 28.2 pg (ref 25.1–34.0)
MCHC: 33.4 g/dL (ref 31.5–36.0)
MCV: 84.3 fL (ref 79.5–101.0)
MONO#: 1.1 10*3/uL — AB (ref 0.1–0.9)
MONO%: 13.7 % (ref 0.0–14.0)
NEUT#: 6.3 10*3/uL (ref 1.5–6.5)
NEUT%: 75.4 % (ref 38.4–76.8)
PLATELETS: 187 10*3/uL (ref 145–400)
RBC: 4.16 10*6/uL (ref 3.70–5.45)
RDW: 14.6 % — AB (ref 11.2–14.5)
WBC: 8.4 10*3/uL (ref 3.9–10.3)

## 2016-12-23 LAB — COMPREHENSIVE METABOLIC PANEL
ALBUMIN: 3.7 g/dL (ref 3.5–5.0)
ALK PHOS: 103 U/L (ref 40–150)
ALT: 34 U/L (ref 0–55)
ANION GAP: 8 meq/L (ref 3–11)
AST: 30 U/L (ref 5–34)
BILIRUBIN TOTAL: 0.32 mg/dL (ref 0.20–1.20)
BUN: 5.3 mg/dL — AB (ref 7.0–26.0)
CALCIUM: 9.2 mg/dL (ref 8.4–10.4)
CHLORIDE: 106 meq/L (ref 98–109)
CO2: 25 mEq/L (ref 22–29)
Creatinine: 0.8 mg/dL (ref 0.6–1.1)
EGFR: >90 mL/min/{1.73_m2} (ref >90)
Glucose: 106 mg/dl (ref 70–140)
Potassium: 3.5 mEq/L (ref 3.5–5.1)
Sodium: 139 mEq/L (ref 136–145)
Total Protein: 7 g/dL (ref 6.4–8.3)

## 2016-12-23 MED ORDER — PEGFILGRASTIM 6 MG/0.6ML ~~LOC~~ PSKT
6.0000 mg | PREFILLED_SYRINGE | Freq: Once | SUBCUTANEOUS | Status: AC
  Administered 2016-12-23: 6 mg via SUBCUTANEOUS
  Filled 2016-12-23: qty 0.6

## 2016-12-23 MED ORDER — SODIUM CHLORIDE 0.9 % IV SOLN
Freq: Once | INTRAVENOUS | Status: AC
  Administered 2016-12-23: 13:00:00 via INTRAVENOUS
  Filled 2016-12-23: qty 5

## 2016-12-23 MED ORDER — SODIUM CHLORIDE 0.9% FLUSH
10.0000 mL | Freq: Once | INTRAVENOUS | Status: AC
  Administered 2016-12-23: 10 mL via INTRAVENOUS
  Filled 2016-12-23: qty 10

## 2016-12-23 MED ORDER — HEPARIN SOD (PORK) LOCK FLUSH 100 UNIT/ML IV SOLN
500.0000 [IU] | Freq: Once | INTRAVENOUS | Status: AC | PRN
  Administered 2016-12-23: 500 [IU]
  Filled 2016-12-23: qty 5

## 2016-12-23 MED ORDER — SODIUM CHLORIDE 0.9 % IV SOLN
600.0000 mg/m2 | Freq: Once | INTRAVENOUS | Status: AC
  Administered 2016-12-23: 1200 mg via INTRAVENOUS
  Filled 2016-12-23: qty 60

## 2016-12-23 MED ORDER — SODIUM CHLORIDE 0.9 % IV SOLN
Freq: Once | INTRAVENOUS | Status: AC
  Administered 2016-12-23: 12:00:00 via INTRAVENOUS

## 2016-12-23 MED ORDER — DOXORUBICIN HCL CHEMO IV INJECTION 2 MG/ML
60.0000 mg/m2 | Freq: Once | INTRAVENOUS | Status: AC
  Administered 2016-12-23: 120 mg via INTRAVENOUS
  Filled 2016-12-23: qty 60

## 2016-12-23 MED ORDER — GUAIFENESIN-CODEINE 100-10 MG/5ML PO SOLN: 5.0000 mL | Freq: Four times a day (QID) | ORAL | 0 refills | Status: DC | PRN

## 2016-12-23 MED ORDER — PALONOSETRON HCL INJECTION 0.25 MG/5ML
INTRAVENOUS | Status: AC
  Filled 2016-12-23: qty 5

## 2016-12-23 MED ORDER — SODIUM CHLORIDE 0.9% FLUSH
10.0000 mL | INTRAVENOUS | Status: DC | PRN
  Administered 2016-12-23: 10 mL
  Filled 2016-12-23: qty 10

## 2016-12-23 MED ORDER — PALONOSETRON HCL INJECTION 0.25 MG/5ML
0.2500 mg | Freq: Once | INTRAVENOUS | Status: AC
  Administered 2016-12-23: 0.25 mg via INTRAVENOUS

## 2016-12-23 NOTE — Patient Instructions (Signed)
May remove Neulast On-Pro at 7pm on Friday 12/11/2016.   [facility] Discharge Instructions for Patients Receiving Chemotherapy  Today you received the following chemotherapy agents Adriamycin, Cytoxan and Neulasta.  To help prevent nausea and vomiting after your treatment, we encourage you to take your nausea medication as directed.   If you develop nausea and vomiting that is not controlled by your nausea medication, call the clinic.   BELOW ARE SYMPTOMS THAT SHOULD BE REPORTED IMMEDIATELY:  *FEVER GREATER THAN 100.5 F  *CHILLS WITH OR WITHOUT FEVER  NAUSEA AND VOMITING THAT IS NOT CONTROLLED WITH YOUR NAUSEA MEDICATION  *UNUSUAL SHORTNESS OF BREATH  *UNUSUAL BRUISING OR BLEEDING  TENDERNESS IN MOUTH AND THROAT WITH OR WITHOUT PRESENCE OF ULCERS  *URINARY PROBLEMS  *BOWEL PROBLEMS  UNUSUAL RASH Items with * indicate a potential emergency and should be followed up as soon as possible.  Feel free to call the clinic you have any questions or concerns. The clinic phone number is [phone].  Please show the [facility] at check-in to the Emergency Department and triage nurse.

## 2016-12-23 NOTE — Patient Instructions (Signed)
Implanted Port Home Guide An implanted port is a type of central line that is placed under the skin. Central lines are used to provide IV access when treatment or nutrition needs to be given through a person's veins. Implanted ports are used for long-term IV access. An implanted port may be placed because:  You need IV medicine that would be irritating to the small veins in your hands or arms.  You need long-term IV medicines, such as antibiotics.  You need IV nutrition for a long period.  You need frequent blood draws for lab tests.  You need dialysis.  Implanted ports are usually placed in the chest area, but they can also be placed in the upper arm, the abdomen, or the leg. An implanted port has two main parts:  Reservoir. The reservoir is round and will appear as a small, raised area under your skin. The reservoir is the part where a needle is inserted to give medicines or draw blood.  Catheter. The catheter is a thin, flexible tube that extends from the reservoir. The catheter is placed into a large vein. Medicine that is inserted into the reservoir goes into the catheter and then into the vein.  How will I care for my incision site? Do not get the incision site wet. Bathe or shower as directed by your health care provider. How is my port accessed? Special steps must be taken to access the port:  Before the port is accessed, a numbing cream can be placed on the skin. This helps numb the skin over the port site.  Your health care provider uses a sterile technique to access the port. ? Your health care provider must put on a mask and sterile gloves. ? The skin over your port is cleaned carefully with an antiseptic and allowed to dry. ? The port is gently pinched between sterile gloves, and a needle is inserted into the port.  Only "non-coring" port needles should be used to access the port. Once the port is accessed, a blood return should be checked. This helps ensure that the port  is in the vein and is not clogged.  If your port needs to remain accessed for a constant infusion, a clear (transparent) bandage will be placed over the needle site. The bandage and needle will need to be changed every week, or as directed by your health care provider.  Keep the bandage covering the needle clean and dry. Do not get it wet. Follow your health care provider's instructions on how to take a shower or bath while the port is accessed.  If your port does not need to stay accessed, no bandage is needed over the port.  What is flushing? Flushing helps keep the port from getting clogged. Follow your health care provider's instructions on how and when to flush the port. Ports are usually flushed with saline solution or a medicine called heparin. The need for flushing will depend on how the port is used.  If the port is used for intermittent medicines or blood draws, the port will need to be flushed: ? After medicines have been given. ? After blood has been drawn. ? As part of routine maintenance.  If a constant infusion is running, the port may not need to be flushed.  How long will my port stay implanted? The port can stay in for as long as your health care provider thinks it is needed. When it is time for the port to come out, surgery will be   done to remove it. The procedure is similar to the one performed when the port was put in. When should I seek immediate medical care? When you have an implanted port, you should seek immediate medical care if:  You notice a bad smell coming from the incision site.  You have swelling, redness, or drainage at the incision site.  You have more swelling or pain at the port site or the surrounding area.  You have a fever that is not controlled with medicine.  This information is not intended to replace advice given to you by your health care provider. Make sure you discuss any questions you have with your health care provider. Document  Released: 03/16/2005 Document Revised: 08/22/2015 Document Reviewed: 11/21/2012 [employer] Interactive Patient Education  2017 [employer]  

## 2016-12-23 NOTE — Progress Notes (Signed)
[place]  [phone]) [phone] Fax:(336) [phone]  Clinic Follow-up Note   Patient Care Team: Marton Redwood, MD as PCP - General (Internal Medicine) Melrose Nakayama, MD as Consulting Physician (Cardiothoracic Surgery) Fanny Skates, MD as Consulting Physician (General Surgery) Truitt Merle, MD as Consulting Physician (Hematology) 12/23/2016   CHIEF COMPLAINTS F/U left breast cancer   Oncology History   Cancer Staging Breast cancer of upper-inner quadrant of left female breast [facility]) Staging form: Breast, AJCC 8th Edition - Clinical stage from 07/10/2016: Stage 0 (cTis (DCIS), cN0, cM0, G3, ER: Positive, PR: Positive, HER2: Not Assessed) - Signed by Truitt Merle, MD on 07/28/2016 - Pathologic stage from 10/26/2016: Stage IA (pT1a, pN1a, cM0, G2, ER: Positive, PR: Positive, HER2: Negative) - Signed by Truitt Merle, MD on 11/11/2016       Breast cancer of upper-inner quadrant of left female breast ([place])   07/10/2016 Initial Biopsy    Diagnosis 1. 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 2. 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      07/10/2016 Receptors her2    ER 95%, PR 80-90%+, both strong staining, Ki67 80-90%      07/22/2016 Initial Diagnosis    Ductal carcinoma in situ (DCIS) of left breast     07/27/2016 Imaging    Breast MRI w wo contrast showed  1. Extensive mass and non mass enhancement along the lower inner aspect the left breast, lower outer quadrant, spanning 10.2 cm from anterior to posterior, including both biopsied lesions, as detailed above. This is all consistent with DCIS. 2. No other abnormal enhancement in the left breast. 3. No abnormal or enlarged axillary lymph nodes. 4. No evidence of malignancy in the right breast      08/10/2016 Genetic Testing    ATM c.4066A>G VUS identified on the Common Hereditary Cancer panel. The Hereditary Gene Panel offered by [facility] includes sequencing and/or deletion duplication testing of the following 46 genes: APC, ATM, AXIN2, BARD1, BMPR1A, BRCA1, BRCA2, BRIP1, CDH1, CDKN2A (p14ARF), CDKN2A (p16INK4a), CHEK2, CTNNA1, DICER1, EPCAM (Deletion/duplication testing only), GREM1 (promoter region deletion/duplication testing only), KIT, MEN1, MLH1, MSH2, MSH3, MSH6, MUTYH, NBN, NF1, NHTL1, PALB2, PDGFRA, PMS2, POLD1, POLE, PTEN, RAD50, RAD51C, RAD51D, SDHB, SDHC, SDHD, SMAD4, SMARCA4. STK11, TP53, TSC1, TSC2, and VHL.  The following gene was evaluated for sequence changes only: SDHA and HOXB13 c.251G>A variant only.  The report date is Aug 09, 2016.       10/13/2016 Surgery    LEFT TOTAL MASTECTOMY WITH LEFT AXILLARY SENTINEL LYMPH NODE BIOPSY By Dr. Ninfa Linden      10/13/2016 Pathology Results    Surgery Diagnosis 10/13/16  1. Lymph node, sentinel, biopsy, Left Axillary - METASTATIC CARCINOMA IN ONE LYMPH NODE (1/1). 2. Lymph node, sentinel, biopsy, Left - METASTATIC CARCINOMA IN ONE LYMPH NODE (1/1). 3. Breast, radical mastectomy (including lymph nodes), Left - INVASIVE DUCTAL CARCINOMA, 0.3 CM. - LYMPHOVASCULAR INVOLVEMENT BY CARCINOMA. - HIGH GRADE DUCTAL CARCINOMA IN SITU WITH CALCIFICATIONS AND NECROSIS, 5.5 CM. - DCIS FOCALLY 0.1 CM FROM CAUTERIZED MEDIAL MARGIN. - SEE ONCOLOGY TABLE AND COMMENT. 4. Skin , Left additional mastectomy flap - BENIGN FIBROADIPOSE TISSUE. - NO EVIDENCE OF MALIGNANCY.      10/30/2016 Miscellaneous    MammaPrint 10/31/26 Result:  High Risk,Luminal type B MPI: -0.368 Average 10-year risk of  recurrence untreated: 29%      11/18/2016 Imaging    CT CAP W Contrast 11/18/16 IMPRESSION: 1. Skin thickening and interstitial changes involving the left breast, likely related to radiation. Surgical changes are also noted with a tissue expander in  place. Probable postop fluid collection in the upper outer quadrant breast. No enlarged supraclavicular or axillary lymph nodes. 2. No CT findings suggesting metastatic disease involving the chest, abdomen or pelvis or bony structures. 3. Surgical changes from a left upper lobe wedge resection. No findings for recurrent tumor.      11/23/2016 Imaging    Bone Scan Whole body 11/23/16 IMPRESSION: No definite scintigraphic evidence of osseous metastatic disease as above.      11/25/2016 -  Chemotherapy    Adjuvant chemotherapy AC every 2 weeks for 4 cycles followed by Taxol weekly for 12 cycles starting 11/25/16         HISTORY OF PRESENTING ILLNESS (07/28/16):  Taylor Walsh 61 y.o. 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 She presents to the clinic today by herself. She was referred by her surgeon Dr. Dalbert Batman.   This was discovered by routine screening mammogram. She denies any palpable breast mass or adenopathy. She denies any other new symptoms. She was contacted for her need of a biopsy to further investigate the calcification found in her mammogram. A biopsy was done leading to a diagnosis of her DCIS. She reports having arthritis in the right knee. She reported having a tubal ligation and hysterectomy. She also has famaily history of breast cancer with mother and sister. She reports to live with her daughter and 2 of her grandchildren. She reports of having history of HTN, arthritis, Thyroidism, being Pre-diabetic, Osteopenia and is currently back to Smoking. She also reports her brother has a history of blood clots and he was told that it was hereditary which she is following up with Dr. Brigitte Pulse about testing.   GYN HISTORY  Menarchal: 11 LMP: 36 before hysterectomy Contraceptive: N/A HRT: no GP:G2P2    CURRENT THERAPY: Adjuvant chemotherapy AC every 2 weeks for 4 cycles starting 11/25/16 followed by Taxol weekly for 12 cycles    INTERVAL HISTORY:  Taylor Walsh is here for a follow up. She presents to clinic today noting she has a dry cough since 5 days ago. Her daughter had a cold and her grandson had a runny nose. Pt is afebrile and denies chest pain or discomfort, chills. Her throat is scratchy.   She has been on Metformin for about a week.  Her tongue is slightly black form treatment. Cold water will hurt in her mouth. She has upper teeth pain. She has no pain from blister in her right cheek. She gargles with warm salt water 3 times a day and does not use alcohol containing mouth wash.   She will wake up with a sweet taste in her mouth. I explained her taste will return after chemo. She has had boost ensure.   She tried to get in touch with [employer] about her FMLA paperwork. She is waiting on them.   She feels that she smells an odor when she goes to the bathroom.   Will get flu shot next time in October. Will wait until cough resolves.     MEDICAL HISTORY:  Past Medical History:  Diagnosis Date  . Anxiety   . Arthritis   . Cancer ([place]) 10/08/2016   left breast cancer  . Diabetes mellitus  without complication ([place])   . Dyslipidemia   . Family history of breast cancer   . Family history of ovarian cancer   . Family history of prostate cancer   . GERD (gastroesophageal reflux disease)    nexium if needed  . Heart murmur   . Hypertension   . Hypothyroid   . Osteopenia   . Pneumonia 10/08/2016   June 2013    SURGICAL HISTORY: Past Surgical History:  Procedure Laterality Date  . ABDOMINAL HYSTERECTOMY    . BREAST RECONSTRUCTION WITH PLACEMENT OF TISSUE EXPANDER AND FLEX HD (ACELLULAR HYDRATED DERMIS) Left 10/13/2016   Procedure: LEFT BREAST RECONSTRUCTION WITH PLACEMENT OF TISSUE EXPANDER AND ALLODERM;  Surgeon: Irene Limbo, MD;  Location: [place];  Service: Plastics;  Laterality: Left;  . FOOT SURGERY     bi-lat/ repaired hammer toes  . MASTECTOMY W/ SENTINEL NODE BIOPSY Left 10/13/2016  . MASTECTOMY W/ SENTINEL NODE  BIOPSY Left 10/13/2016   Procedure: LEFT TOTAL MASTECTOMY WITH LEFT AXILLARY SENTINEL LYMPH NODE BIOPSY;  Surgeon: Coralie Keens, MD;  Location: [place];  Service: General;  Laterality: Left;  . PORTACATH PLACEMENT Right 11/24/2016   Procedure: INSERTION PORT-A-CATH;  Surgeon: Fanny Skates, MD;  Location: [place];  Service: General;  Laterality: Right;  . TUBAL LIGATION    . VIDEO ASSISTED THORACOSCOPY (VATS)/WEDGE RESECTION Left 02/25/2015   Procedure: VIDEO ASSISTED THORACOSCOPY (VATS)/ LUL WEDGE RESECTION with ON Q placement.;  Surgeon: Melrose Nakayama, MD;  Location: [place];  Service: Thoracic;  Laterality: Left;    SOCIAL HISTORY: Social History   Social History  . Marital status: Single    Spouse name: N/A  . Number of children: 2  . Years of education: N/A   Occupational History  . Not on file.   Social History Main Topics  . Smoking status: Former Smoker    Packs/day: 1.00    Years: 42.00    Quit date: 08/27/2016  . Smokeless tobacco: Never Used     Comment: 5/1/18she smokes occasionally when stressed. She is smoking 4 cigarettes a week  . Alcohol use No  . Drug use: No  . Sexual activity: Not on file   Other Topics Concern  . Not on file   Social History Narrative  . No narrative on file    FAMILY HISTORY: Family History  Problem Relation Age of Onset  . Cancer Mother        brain, ovarian  . Hypertension Mother   . Stroke Father   . Hypertension Father   . Cancer Sister 70       bilateral breast cancer, 2nd cancer at 72  . Cancer Brother        dx with prostate cancer in the 48s  . Cancer Brother        dx in his 7s with prostate cancer  . Brain cancer Maternal Uncle   . Cirrhosis Brother   . Cancer Brother        NOS  . Cancer Paternal Aunt        NOS  . Cancer Cousin        paternal cousin with cancer NOS    ALLERGIES:  is allergic to vicodin [hydrocodone-acetaminophen].  MEDICATIONS:  Current Outpatient  Prescriptions  Medication Sig Dispense Refill  . aspirin 81 MG tablet Take 81 mg by mouth daily.    Marland Kitchen atorvastatin (LIPITOR) 40 MG tablet Take 40 mg by mouth daily.    . calcium-vitamin D 250-100  MG-UNIT tablet Take 2 tablets by mouth daily.     . ergocalciferol (VITAMIN D2) 50000 UNITS capsule Take 50,000 Units by mouth every Friday.     . levothyroxine (SYNTHROID, LEVOTHROID) 88 MCG tablet Take 88 mcg by mouth daily before breakfast.     . lidocaine-prilocaine (EMLA) cream Apply to affected area once 30 g 3  . metFORMIN (GLUCOPHAGE) 500 MG tablet Take 1 tablet (500 mg total) by mouth 2 (two) times daily with a meal. 60 tablet 0  . olmesartan (BENICAR) 20 MG tablet Take 20 mg by mouth daily.    Marland Kitchen guaiFENesin-codeine 100-10 MG/5ML syrup Take 5 mLs by mouth every 6 (six) hours as needed for cough. 120 mL 0  . HYDROcodone-acetaminophen (NORCO) 5-325 MG tablet Take 1-2 tablets by mouth every 6 (six) hours as needed for moderate pain or severe pain. (Patient not taking: Reported on 12/23/2016) 20 tablet 0  . ibuprofen (ADVIL,MOTRIN) 600 MG tablet Take 1 tablet (600 mg total) by mouth every 8 (eight) hours as needed. (Patient not taking: Reported on 12/23/2016) 15 tablet 0  . methocarbamol (ROBAXIN) 500 MG tablet Take 1 tablet (500 mg total) by mouth every 8 (eight) hours as needed for muscle spasms. (Patient not taking: Reported on 12/23/2016) 30 tablet 0  . ondansetron (ZOFRAN) 8 MG tablet Take 1 tablet (8 mg total) by mouth 2 (two) times daily as needed. Start on the third day after chemotherapy. (Patient not taking: Reported on 12/23/2016) 30 tablet 1  . prochlorperazine (COMPAZINE) 10 MG tablet Take 1 tablet (10 mg total) by mouth every 6 (six) hours as needed (Nausea or vomiting). (Patient not taking: Reported on 12/23/2016) 30 tablet 1  . traMADol (ULTRAM) 50 MG tablet Take 50 mg by mouth every 6 (six) hours as needed for moderate pain or severe pain.     . valACYclovir (VALTREX) 1000 MG tablet Take  1,000 mg by mouth daily as needed (for sores).      No current facility-administered medications for this visit.     REVIEW OF SYSTEMS:  Constitutional: Denies fevers, chills (+) night sweats (+) taste change Eyes: Denies blurriness of vision, double vision or watery eyes Ears, nose, mouth, throat, and face: Denies mucositis or sore throat Respiratory: Denies dyspnea or wheezes (+) Dry cough, scratchy throat Cardiovascular: Denies palpitation, chest discomfort or lower extremity swelling Gastrointestinal:  Denies nausea, heartburn or change in bowel habits Skin: Denies abnormal skin rashes Lymphatics: Denies new lymphadenopathy or easy bruising Musculoskeletal: (+) arthritis in the right knee Neurological:Denies numbness, tingling or new weaknesses Breast (+) Left breast pain and soreness  Behavioral/Psych: Mood is stable, no new changes  All other systems were reviewed with the patient and are negative.  PHYSICAL EXAMINATION: ECOG PERFORMANCE STATUS: 0 - Asymptomatic  Vitals:   12/23/16 1110  BP: 125/73  Pulse: 96  Resp: 18  Temp: 98.3 F (36.8 C)  SpO2: 100%   Filed Weights   12/23/16 1110  Weight: 198 lb 6.4 oz (90 kg)    GENERAL:alert, no distress and comfortable SKIN: skin color, texture, turgor are normal, no rashes or significant lesions EYES: normal, conjunctiva are pink and non-injected, sclera clear OROPHARYNX:no exudate, no erythema and lips, buccal mucosa, and tongue normal  (+) Mild Mucositis  NECK: supple, thyroid normal size, non-tender, without nodularity LYMPH:  no palpable lymphadenopathy in the cervical, axillary or inguinal LUNGS: clear to auscultation and percussion with normal breathing effort HEART: regular rate & rhythm and no murmurs and no lower  extremity edema ABDOMEN:abdomen soft, non-tender and normal bowel sounds Musculoskeletal:no cyanosis of digits and no clubbing  PSYCH: alert & oriented x 3 with fluent speech NEURO: no focal  motor/sensory deficits Breasts: Breast inspection showed them to be symmetrical with no nipple discharge. (+) S/p mastectomy with tissue expander placement with incision elated very well, some tenderness.   LABORATORY DATA:  I have reviewed the data as listed CBC Latest Ref Rng & Units 12/23/2016 12/10/2016 11/25/2016  WBC 3.9 - 10.3 10e3/uL 8.4 5.4 11.1(H)  Hemoglobin 11.6 - 15.9 g/dL 11.7 12.1 11.8  Hematocrit 34.8 - 46.6 % 35.1 37.2 36.7  Platelets 145 - 400 10e3/uL 187 268 254    CMP Latest Ref Rng & Units 12/23/2016 12/10/2016 11/25/2016  Glucose 70 - 140 mg/dl 106 219(H) 148(H)  BUN 7.0 - 26.0 mg/dL 5.3(L) 8.9 10.8  Creatinine 0.6 - 1.1 mg/dL 0.8 1.0 0.8  Sodium 136 - 145 mEq/L 139 138 139  Potassium 3.5 - 5.1 mEq/L 3.5 4.0 4.1  Chloride 101 - 111 mmol/L - - -  CO2 22 - 29 mEq/L _0 Calcium 8.4 - 10.4 mg/dL 9.2 9.5 10.0  Total Protein 6.4 - 8.3 g/dL 7.0 7.1 7.3  Total Bilirubin 0.20 - 1.20 mg/dL 0.32 0.28 0.65  Alkaline Phos 40 - 150 U/L 103 109 74  AST 5 - 34 U/L _1 ALT 0 - 55 U/L 34 29 24    PATHOLOGY:   Surgery Diagnosis 10/13/16  1. Lymph node, sentinel, biopsy, Left Axillary - METASTATIC CARCINOMA IN ONE LYMPH NODE (1/1). 2. Lymph node, sentinel, biopsy, Left - METASTATIC CARCINOMA IN ONE LYMPH NODE (1/1). 3. Breast, radical mastectomy (including lymph nodes), Left - INVASIVE DUCTAL CARCINOMA, 0.3 CM. - LYMPHOVASCULAR INVOLVEMENT BY CARCINOMA. - HIGH GRADE DUCTAL CARCINOMA IN SITU WITH CALCIFICATIONS AND NECROSIS, 5.5 CM. - DCIS FOCALLY 0.1 CM FROM CAUTERIZED MEDIAL MARGIN. - SEE ONCOLOGY TABLE AND COMMENT. 4. Skin , Left additional mastectomy flap - BENIGN FIBROADIPOSE TISSUE. - NO EVIDENCE OF MALIGNANCY. Microscopic Comment 3. BREAST, INVASIVE TUMOR Procedure: Mastectomy and two sentinel lymph nodes with additional left mastectomy flap. Laterality: Left breast Tumor Size: 0.3 cm invasive carcinoma; 5.5 cm DCIS Histologic Type: Ductal Grade:  II Tubular Differentiation: 2 Nuclear Pleomorphism: 2 Mitotic Count: 2 Ductal Carcinoma in Situ (DCIS): Present, high grade with necrosis Extent of Tumor: Skin: Free of tumor Nipple: Free of tumor Microscopic Comment(continued) Skeletal muscle: Free of tumor Margins: Free of tumor Invasive carcinoma, distance from closest margin: Greater than 1.5 cm DCIS, distance from closest margin: Less than 0.1 cm from medial margin Regional Lymph Nodes: Number of Lymph Nodes Examined: 2 Number of Sentinel Lymph Nodes Examined: 2 Lymph Nodes with Macrometastases: 2 Lymph Nodes with Micrometastases: 0 Lymph Nodes with Isolated Tumor Cells: 0 Breast Prognostic Profile: Pending Estrogen Receptor: Pending Progesterone Receptor: Pending Her2: Pending Ki-67: Pending Best tumor block for sendout testing: 3[facility] Pathologic Stage Classification (pTNM, AJCC 8th Edition): Primary Tumor (pT): pT1a Regional Lymph Nodes (pN): pN1a Distant Metastases (pM): pMX Comments: There is extensive high grade ductal carcinoma in situ with necrosis. Multiple additional portions of the tissue are examined and there is a 0.3 cm invasive ductal carcinoma associated with lymphovascular involvement by tumor. Breast prognostic profile will be performed on the invasive carcinoma and reported as an addendum. Immunohistochemistry shows positive basal cell staining in the DCIS with calponin, smooth muscle myosin and p63. ADDITIONAL INFORMATION: 3. By immunohistochemistry, the tumor cells are Negative for Her2 (  1+). Vicente Males MD Pathologist, Electronic Signature ( Signed 10/21/2016) 3. PROGNOSTIC INDICATORS For Part 3[facility] Results: IMMUNOHISTOCHEMICAL AND MORPHOMETRIC ANALYSIS PERFORMED MANUALLY Estrogen Receptor: 95%, POSITIVE, STRONG STAINING INTENSITY Progesterone Receptor: 45%, POSITIVE, STRONG STAINING INTENSITY Proliferation Marker Ki67: 15% ADDITIONAL INFORMATION:(continued) Results: HER2 - *EQUIVOCAL*. OF NOTE, A  TOTAL OF 40 TUMOR CELLS WERE EVALUATED FOR HER2 EXPRESSION. HER2 BY IMMUNOHISTOCHEMISTRY WILL BE PERFORMED AND THE RESULTS REPORTED SEPARATELY. RATIO OF HER2/CEP17 SIGNALS 1.67 AVERAGE HER2 COPY NUMBER PER CELL 4.18   Surgery: 07/10/16 Diagnosis 1. 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 - SEE MICROSCOPIC DESCRIPTION. 2. 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 - SEE MICROSCOPIC DESCRIPTION. Microscopic Comment 1. Prognostic markers have been ordered and will be reported in an addendum. 2. Prognostic markers have been ordered and will be reported in an addendum. Called to the [facility] on 07/13/16. Results: IMMUNOHISTOCHEMICAL AND MORPHOMETRIC ANALYSIS PERFORMED MANUALLY Estrogen Receptor: 95%, POSITIVE, STRONG STAINING INTENSITY Progesterone Receptor: 80%, POSITIVE, STRONG STAINING INTENSITY  Biopsy: 02/25/15 Diagnosis 1. 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 - NO ATYPIA OR MALIGNANCY IDENTIFIED. - SEE COMMENT. 2. Lymph node, biopsy, 12 L - ONE BENIGN LYMPH NODE WITH NO TUMOR SEEN (0/1). 3. Lymph node, biopsy, 9 L - ONE BENIGN LYMPH NODE WITH NO TUMOR SEEN (0/1). Microscopic Comment 1. A Congo Red stain is performed and a crystal violet stain is performed on the lung specimen. The staining pattern (apple green birefringence) coupled with the morphology is consistent with amyloid deposition. Dr. Zenda Alpers, Dr. Fletcher Anon and Dr. Darl Householder have all seen this case in consultation with agreement that amyloid deposition is present. Dr. Donato Heinz has also seen this case in consultation with agreement of the additional findings in the  case.   MammaPrint 10/31/26 Result:  High Risk at 94.6% MPI: -0.368 Average 10-year risk of recurrence untreated: 29%   Genetics 07/29/16    PROCEDURES  ECHO 11/17/16 Study Conclusions - Left ventricle: The cavity size was normal. Wall thickness was   normal. Systolic function was normal. The estimated ejection   fraction was in the range of 60% to 65%. Wall motion was normal;   there were no regional wall motion abnormalities. Features are   consistent with a pseudonormal left ventricular filling pattern,   with concomitant abnormal relaxation and increased filling   pressure (grade 2 diastolic dysfunction). - Aortic valve: There was mild regurgitation. Valve area (VTI):   1.84 cm^2. Valve area (Vmax): 1.76 cm^2. Valve area (Vmean): 1.89   cm^2.    RADIOGRAPHIC STUDIES: I have personally reviewed the radiological images as listed and agreed with the findings in the report. Dg Chest Port 1 View  Result Date: 11/24/2016 CLINICAL DATA:  Status post Port-A-Cath appliance placement on the right. Left breast malignancy. EXAM: PORTABLE CHEST 1 VIEW COMPARISON:  Chest x-ray of March 12, 2015 FINDINGS: The lungs are adequately inflated. There is no postprocedure pneumothorax following porta catheter placement. The tip of the catheter projects at the junction of the middle and distal portions of the SVC. The reservoir overlies the mid thorax. The cardiac silhouette is top-normal in size. The central pulmonary vascularity is mildly prominent but has since accentuated by the portable technique. There is calcification in  the wall of the aortic arch. The bony thorax exhibits no acute abnormality. IMPRESSION: No immediate postprocedure complication following Port-A-Cath appliance placement. Thoracic aortic atherosclerosis. Electronically Signed   By: David  Martinique M.D.   On: 11/24/2016 12:19   Dg Fluoro Guide Cv Line-no Report  Result Date: 11/24/2016 Fluoroscopy was utilized by the  requesting physician.  No radiographic interpretation.    ASSESSMENT & PLAN:  Taylor Walsh is 61 y.o. who present to the clinic with Ductal carcinoma in situ (DCIS) of left breast  1. 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 - She had mostly DCIS in her breast but There was small component of invasive cancer that has spread to 2 of her lymph nodes.  Her surgical margins were negative. -Her mammaprint showed high-risk disease, luminal type B -Her staging scan was negative for distant metastasis, or residual axillary adenopathy. --I recommended adjuvant chemotherapy with dose dense AC every 2 weeks for 4 cycles, followed by weekly Taxol for 12 weeks. The course of therapy is curative.  -Pt started [facility] chemo on 11/25/16 and is tolerating well overall.  -Labs are overall normal and she is adequate to continue with [facility] cycle 3 today and start Taxol weekly in 4 weeks -F/u in 2 weeks    2.Arthritis -She experiences mild arthritis in LE, more specific to her right knee -She is pretty active   3. Osteopenia: -Last bone density scan ordered by Dr Brigitte Pulse in the last 2 years -Will f/u with PCP and scans every 2 years  4. Smoking cessation -patient stopped smoking but after close friend passing she has started again smoking.  -She smokes approximately 1 pack a week.  -The patient has quit smoking in 07/2016  5. 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 The Hereditary Gene Panel offered by [facility] includes sequencing and/or deletion duplication testing of the following 46 genes: APC, ATM, AXIN2, BARD1, BMPR1A, BRCA1, BRCA2, BRIP1, CDH1, CDKN2A (p14ARF), CDKN2A (p16INK4a), CHEK2, CTNNA1, DICER1, EPCAM (Deletion/duplication testing only), GREM1  (promoter region deletion/duplication testing only), KIT, MEN1, MLH1, MSH2, MSH3, MSH6, MUTYH, NBN, NF1, NHTL1, PALB2, PDGFRA, PMS2, POLD1, POLE, PTEN, RAD50, RAD51C, RAD51D, SDHB, SDHC, SDHD, SMAD4, SMARCA4. STK11, TP53, TSC1, TSC2, and VHL.  The following gene was evaluated for sequence changes only: SDHA and HOXB13 c.251G>A variant only.  The report date is Aug 09, 2016.  6. Dry cough, bronchitis  - she has developed some dry cough since 3-4 days ago, no chest discomfort, sputum production, fever or chills. (+) Sick contact. -This is likely virus upper respiratory infection, I do not think she needs antibiotics for now. -She knows to contact us if her cough worsens or does not resolve with medication. If needed we will get a chest XRay -Order Hycodan to take at night to help along with fluids and rest  -She denies being allergic to hydrocodone/codein  -Will hold flu shot until the resolves or improves.   7. Mild Mucositis  -She will continue to take her mouth wash and gargle salt water -Will monitor    PLAN:  -Order Hycodan for cough  -Contact Tiffany about FMLA paperwork  -Labs adequate today to Continue with cycle 3 of AC  -Lab, flush, f/u and chemo AC in 2 weeks -Lab, flush, and weekly taxol X4, starting  in 4 weeks  -F/u in 4 and 6 weeks    No orders of the defined types were placed in this encounter.   All questions were answered. The patient knows to call the clinic with any problems, questions or concerns. I spent 25 minutes counseling the patient face to face. The total time spent in the appointment was 30 minutes and more than 50% was on counseling.     Truitt Merle, MD 12/23/2016   This document serves as a record of services personally performed by Truitt Merle, MD. It was created on her behalf by Joslyn Devon, a trained medical scribe. The creation of this record is based on the scribe's personal observations and the provider's statements to them. This document has been  checked and approved by the attending provider.

## 2016-12-24 ENCOUNTER — Ambulatory Visit: Payer: BC Managed Care – PPO

## 2016-12-24 DIAGNOSIS — M25612 Stiffness of left shoulder, not elsewhere classified: Secondary | ICD-10-CM | POA: Diagnosis not present

## 2016-12-24 DIAGNOSIS — M25512 Pain in left shoulder: Secondary | ICD-10-CM

## 2016-12-24 NOTE — Therapy (Signed)
[place], [place], [address] Phone: [phone]   Fax:  [phone]  Physical Therapy Treatment  Patient Details  Name: Taylor Walsh MRN: [id number] Date of Birth: 01/07/1956 Referring Provider: Thimmappa  Encounter Date: 12/24/2016      PT End of Session - 12/24/16 0933    Visit Number 10   Number of Visits 16   Date for PT Re-Evaluation 01/06/17   PT Start Time 0852   PT Stop Time 0932   PT Time Calculation (min) 40 min   Activity Tolerance Patient tolerated treatment well   Behavior During Therapy [facility] for tasks assessed/performed      Past Medical History:  Diagnosis Date  . Anxiety   . Arthritis   . Cancer ([place]) 10/08/2016   left breast cancer  . Diabetes mellitus without complication ([place])   . Dyslipidemia   . Family history of breast cancer   . Family history of ovarian cancer   . Family history of prostate cancer   . GERD (gastroesophageal reflux disease)    nexium if needed  . Heart murmur   . Hypertension   . Hypothyroid   . Osteopenia   . Pneumonia 10/08/2016   June 2013    Past Surgical History:  Procedure Laterality Date  . ABDOMINAL HYSTERECTOMY    . BREAST RECONSTRUCTION WITH PLACEMENT OF TISSUE EXPANDER AND FLEX HD (ACELLULAR HYDRATED DERMIS) Left 10/13/2016   Procedure: LEFT BREAST RECONSTRUCTION WITH PLACEMENT OF TISSUE EXPANDER AND ALLODERM;  Surgeon: Irene Limbo, MD;  Location: [place];  Service: Plastics;  Laterality: Left;  . FOOT SURGERY     bi-lat/ repaired hammer toes  . MASTECTOMY W/ SENTINEL NODE BIOPSY Left 10/13/2016  . MASTECTOMY W/ SENTINEL NODE BIOPSY Left 10/13/2016   Procedure: LEFT TOTAL MASTECTOMY WITH LEFT AXILLARY SENTINEL LYMPH NODE BIOPSY;  Surgeon: Coralie Keens, MD;  Location: [place];  Service: General;  Laterality: Left;  . PORTACATH PLACEMENT Right 11/24/2016   Procedure: INSERTION PORT-A-CATH;  Surgeon: Fanny Skates, MD;   Location: [place];  Service: General;  Laterality: Right;  . TUBAL LIGATION    . VIDEO ASSISTED THORACOSCOPY (VATS)/WEDGE RESECTION Left 02/25/2015   Procedure: VIDEO ASSISTED THORACOSCOPY (VATS)/ LUL WEDGE RESECTION with ON Q placement.;  Surgeon: Melrose Nakayama, MD;  Location: [place];  Service: Thoracic;  Laterality: Left;    There were no vitals filed for this visit.      Subjective Assessment - 12/24/16 0855    Subjective Just had my chemo yesterday so it hasn't hit me yet.    Pertinent History Presented following screening MMG with new left breast calcifications. 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 MRI showed mass and NME spanning 10.2 cm from anterior to posterior, including both biopsied lesions. No abnormal or enlarged LN, and no evidence of malignancy in the right breast. Final pathology mastectomy 0.3 cm IDC, 5.5 cm high grade DCIS focally 0.1 cm from medial margin, 2/2 SLN +, +LVI. 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 No/denies            [facility] PT  Assessment - 12/24/16 0001      AROM   Left Shoulder Flexion 146 Degrees  With some compensation   Left Shoulder ABduction 152 Degrees                     [facility] Adult PT Treatment/Exercise - 12/24/16 0001      Shoulder Exercises: Pulleys   Flexion 2 minutes   Flexion Limitations VCs for slow pace   ABduction 2 minutes     Shoulder Exercises: Therapy Ball   Flexion 10 reps  With forward lean into end of stretch   ABduction 10 reps  Lt UE with side lean into end of stretch     Shoulder Exercises: Stretch   Corner Stretch 3 reps;20  seconds  Pt returning correct therapist demonstration     Manual Therapy   Manual Therapy Myofascial release;Scapular mobilization   Myofascial Release in supine: left UE myofascial pulling with movement into abduction and at Lt axilla   Passive ROM in supine to left shoulder for ir, er, abduction, and flexion with stretch to tolerance; also for horizontal abduction                        Long Term Clinic Goals - 12/09/16 1328      CC Long Term Goal  #1   Title Pt will be able to independently verbalize lymphedema risk reduction practices   Baseline 12/09/16- pt able to recall independently   Time 4   Period Weeks   Status Achieved     CC Long Term Goal  #2   Title Pt will be independent in a home exercise program for continued strengthening and stretching   Time 4   Period Weeks   Status On-going     CC Long Term Goal  #3   Title Pt will report a 75% improvement in left lateral trunk swelling to allow improved comfort when left arm is at side   Baseline 75% as of 12/01/16, though she still feels it some.   Time 4   Period Weeks   Status Achieved     CC Long Term Goal  #4   Title Pt will demonstrate 160 degrees of left shoulder abduction to allow pt to reach out to sides.   Baseline 101, 12/09/16- 165 degrees   Time 4   Period Weeks   Status Achieved     CC Long Term Goal  #5   Title Pt will demonstrate 160 degrees of left shoulder flexion to allow her to reach items overhead   Baseline 120, 12/09/16- 142 degrees   Time 4   Period Weeks   Status On-going            Plan - 12/24/16 0933    Clinical Impression Statement Pt wa feeling better today having just had another chemo treatment yesterday so able to tolerate AA/ROM stretching today which shedid well with. Remeasured her A/ROM which was essentially unchanged with flexion but though measurements were less than last time for abduction, pt is compensating less now so motion is more correct. She has  appt tomorrow but is unsure of how she will feel, will call  if she has to cancel.    Rehab Potential Good   Clinical Impairments Affecting Rehab Potential pt. has started chemotherapy   PT Frequency 2x / week   PT Duration 4 weeks   PT Treatment/Interventions ADLs/Self Care Home Management;Therapeutic exercise;Therapeutic activities;Patient/family education;Passive range of  motion;Scar mobilization;Manual lymph drainage;Manual techniques;Taping   PT Next Visit Plan pulleys, ball, continue gentle AA/A/PROM to left shoulder, do MLD here if time allows, add to HEP   Consulted and Agree with Plan of Care Patient      Patient will benefit from skilled therapeutic intervention in order to improve the following deficits and impairments:  Decreased knowledge of precautions, Increased edema, Decreased range of motion, Decreased strength, Postural dysfunction, Pain, Increased fascial restricitons  Visit Diagnosis: Stiffness of left shoulder, not elsewhere classified  Acute pain of left shoulder     Problem List Patient Active Problem List   Diagnosis Date Noted  . Encounter for antineoplastic chemotherapy 12/10/2016  . Hyperglycemia 12/10/2016  . DCIS (ductal carcinoma in situ) of breast 10/13/2016  . Genetic testing 08/10/2016  . Family history of breast cancer   . Family history of prostate cancer   . Family history of ovarian cancer   . Breast cancer of upper-inner quadrant of left female breast ([place]) 07/22/2016  . Cigarette smoker 05/20/2016  . 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 Dyspnea 06/24/2015  . Nodule of left lung 02/25/2015  . Hypertension 01/28/2015  . Hypothyroidism 01/28/2015  . GERD (gastroesophageal reflux disease) 01/28/2015  . Lung mass 01/07/2015    Otelia Limes, PTA 12/24/2016, 9:36 AM  [place], [place], [address] Phone: [phone]   Fax:  [phone]  Name:  Taylor Walsh MRN: [id number] Date of Birth: Nov 06, 1955

## 2016-12-25 ENCOUNTER — Encounter: Payer: Self-pay | Admitting: Physical Therapy

## 2016-12-25 ENCOUNTER — Ambulatory Visit: Payer: BC Managed Care – PPO | Admitting: Physical Therapy

## 2016-12-25 DIAGNOSIS — M6281 Muscle weakness (generalized): Secondary | ICD-10-CM

## 2016-12-25 DIAGNOSIS — M25612 Stiffness of left shoulder, not elsewhere classified: Secondary | ICD-10-CM | POA: Diagnosis not present

## 2016-12-25 DIAGNOSIS — M25512 Pain in left shoulder: Secondary | ICD-10-CM

## 2016-12-25 NOTE — Therapy (Signed)
[place], [place], [address] Phone: [phone]   Fax:  [phone]  Physical Therapy Treatment  Patient Details  Name: Taylor Walsh MRN: [id number] Date of Birth: Feb 28, 1956 Referring Provider: Thimmappa  Encounter Date: 12/25/2016      PT End of Session - 12/25/16 0823    Visit Number 11   Number of Visits 16   Date for PT Re-Evaluation 01/06/17   PT Start Time 0804   PT Stop Time 0848   PT Time Calculation (min) 44 min   Activity Tolerance Patient tolerated treatment well   Behavior During Therapy [facility] for tasks assessed/performed      Past Medical History:  Diagnosis Date  . Anxiety   . Arthritis   . Cancer ([place]) 10/08/2016   left breast cancer  . Diabetes mellitus without complication ([place])   . Dyslipidemia   . Family history of breast cancer   . Family history of ovarian cancer   . Family history of prostate cancer   . GERD (gastroesophageal reflux disease)    nexium if needed  . Heart murmur   . Hypertension   . Hypothyroid   . Osteopenia   . Pneumonia 10/08/2016   June 2013    Past Surgical History:  Procedure Laterality Date  . ABDOMINAL HYSTERECTOMY    . BREAST RECONSTRUCTION WITH PLACEMENT OF TISSUE EXPANDER AND FLEX HD (ACELLULAR HYDRATED DERMIS) Left 10/13/2016   Procedure: LEFT BREAST RECONSTRUCTION WITH PLACEMENT OF TISSUE EXPANDER AND ALLODERM;  Surgeon: Irene Limbo, MD;  Location: [place];  Service: Plastics;  Laterality: Left;  . FOOT SURGERY     bi-lat/ repaired hammer toes  . MASTECTOMY W/ SENTINEL NODE BIOPSY Left 10/13/2016  . MASTECTOMY W/ SENTINEL NODE BIOPSY Left 10/13/2016   Procedure: LEFT TOTAL MASTECTOMY WITH LEFT AXILLARY SENTINEL LYMPH NODE BIOPSY;  Surgeon: Coralie Keens, MD;  Location: [place];  Service: General;  Laterality: Left;  . PORTACATH PLACEMENT Right 11/24/2016   Procedure: INSERTION PORT-A-CATH;  Surgeon: Fanny Skates, MD;   Location: [place];  Service: General;  Laterality: Right;  . TUBAL LIGATION    . VIDEO ASSISTED THORACOSCOPY (VATS)/WEDGE RESECTION Left 02/25/2015   Procedure: VIDEO ASSISTED THORACOSCOPY (VATS)/ LUL WEDGE RESECTION with ON Q placement.;  Surgeon: Melrose Nakayama, MD;  Location: [place];  Service: Thoracic;  Laterality: Left;    There were no vitals filed for this visit.      Subjective Assessment - 12/25/16 0807    Subjective My upper right breast felt a little swollen yesterday but it went away. I am not sure if it was the way I was laying or if it was the port.    Patient Stated Goals to get arm back to where it used to be   Currently in Pain? No/denies   Pain Score 0-No pain                         [facility] Adult PT Treatment/Exercise - 12/25/16 0001      Shoulder Exercises: Supine   Horizontal ABduction Strengthening;Both;10 reps;Theraband  cues for hand placement on band   Theraband Level (Shoulder Horizontal ABduction) Level 2 (Red)   External Rotation Strengthening;Both;10 reps;Theraband   Theraband Level (Shoulder External Rotation) Level 2 (Red)   Flexion Strengthening;Both;10 reps;Theraband  narrow and wide grip   Theraband Level (Shoulder Flexion) Level 2 (Red)   Other Supine Exercises D2 x 10 reps bilaterally with  red theraband     Shoulder Exercises: Pulleys   Flexion 2 minutes   ABduction 2 minutes     Shoulder Exercises: Therapy Ball   Flexion 10 reps  With forward lean into end of stretch   ABduction 10 reps  Lt UE with side lean into end of stretch     Shoulder Exercises: Stretch   Corner Stretch 20 seconds;1 rep  with pt returning therapist demonstration     Manual Therapy   Myofascial Release in supine: left UE myofascial pulling with movement into abduction and at Lt axilla   Passive ROM in supine to left shoulder for ir, er, abduction, and flexion with stretch to tolerance; also for horizontal abduction                         Long Term Clinic Goals - 12/09/16 1328      CC Long Term Goal  #1   Title Pt will be able to independently verbalize lymphedema risk reduction practices   Baseline 12/09/16- pt able to recall independently   Time 4   Period Weeks   Status Achieved     CC Long Term Goal  #2   Title Pt will be independent in a home exercise program for continued strengthening and stretching   Time 4   Period Weeks   Status On-going     CC Long Term Goal  #3   Title Pt will report a 75% improvement in left lateral trunk swelling to allow improved comfort when left arm is at side   Baseline 75% as of 12/01/16, though she still feels it some.   Time 4   Period Weeks   Status Achieved     CC Long Term Goal  #4   Title Pt will demonstrate 160 degrees of left shoulder abduction to allow pt to reach out to sides.   Baseline 101, 12/09/16- 165 degrees   Time 4   Period Weeks   Status Achieved     CC Long Term Goal  #5   Title Pt will demonstrate 160 degrees of left shoulder flexion to allow her to reach items overhead   Baseline 120, 12/09/16- 142 degrees   Time 4   Period Weeks   Status On-going            Plan - 12/25/16 0820    Clinical Impression Statement Pt feeling some nauseated from chemo the other day. Issued corner stretch for pt to do at home. Upgraded supine scap exercises to red theraband today. Pt would benefit from instruction in Strength ABC program to add to home exercise program. Continued with PROM and myofascial to L sholder to increase ROM.    Rehab Potential Good   Clinical Impairments Affecting Rehab Potential pt. has started chemotherapy   PT Frequency 2x / week   PT Duration 4 weeks   PT Treatment/Interventions ADLs/Self Care Home Management;Therapeutic exercise;Therapeutic activities;Patient/family education;Passive range of motion;Scar mobilization;Manual lymph drainage;Manual techniques;Taping   PT Next Visit Plan Strength ABC?,  pulleys, ball, continue gentle AA/A/PROM to left shoulder, do MLD here if time allows, add to HEP   PT Home Exercise Plan supine scap exericses   Consulted and Agree with Plan of Care Patient      Patient will benefit from skilled therapeutic intervention in order to improve the following deficits and impairments:  Decreased knowledge of precautions, Increased edema, Decreased range of motion, Decreased strength, Postural dysfunction, Pain, Increased fascial restricitons  Visit Diagnosis: Stiffness of left shoulder, not elsewhere classified  Acute pain of left shoulder  Muscle weakness (generalized)     Problem List Patient Active Problem List   Diagnosis Date Noted  . Encounter for antineoplastic chemotherapy 12/10/2016  . Hyperglycemia 12/10/2016  . DCIS (ductal carcinoma in situ) of breast 10/13/2016  . Genetic testing 08/10/2016  . Family history of breast cancer   . Family history of prostate cancer   . Family history of ovarian cancer   . Breast cancer of upper-inner quadrant of left female breast ([place]) 07/22/2016  . Cigarette smoker 05/20/2016  . 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 Dyspnea 06/24/2015  . Nodule of left lung 02/25/2015  . Hypertension 01/28/2015  . Hypothyroidism 01/28/2015  . GERD (gastroesophageal reflux disease) 01/28/2015  . Lung mass 01/07/2015    Allyson Sabal [facility] 12/25/2016, 8:49 AM  [place], [place], [address] Phone: [phone]   Fax:  [phone]  Name: PEJA ALLENDER MRN: [id number] Date of Birth: 1955-07-27  Manus Gunning, PT 12/25/16 8:49 AM

## 2016-12-25 NOTE — Patient Instructions (Signed)
Flexibility: Corner Stretch    Standing in corner with hands just above shoulder level and feet _10-12___ inches from corner, lean forward until a comfortable stretch is felt across chest. Hold _30-60___ seconds. Repeat __5__ times per set. Do _1___ sets per session. Do _2___ sessions per day.  http://orth.exer.us/343   Copyright  VHI. All rights reserved.

## 2016-12-28 ENCOUNTER — Ambulatory Visit: Payer: BC Managed Care – PPO | Attending: Plastic Surgery | Admitting: Physical Therapy

## 2016-12-28 ENCOUNTER — Encounter: Payer: Self-pay | Admitting: Physical Therapy

## 2016-12-28 DIAGNOSIS — M25512 Pain in left shoulder: Secondary | ICD-10-CM | POA: Diagnosis present

## 2016-12-28 DIAGNOSIS — M6281 Muscle weakness (generalized): Secondary | ICD-10-CM | POA: Insufficient documentation

## 2016-12-28 DIAGNOSIS — M25612 Stiffness of left shoulder, not elsewhere classified: Secondary | ICD-10-CM | POA: Insufficient documentation

## 2016-12-28 NOTE — Therapy (Signed)
[place], [place], [address] Phone: [phone]   Fax:  [phone]  Physical Therapy Treatment  Patient Details  Name: Taylor Walsh MRN: [id number] Date of Birth: [birth year] Referring Provider: Thimmappa  Encounter Date: 12/28/2016      PT End of Session - 12/28/16 1017    Visit Number 12   Number of Visits 16   Date for PT Re-Evaluation 01/06/17   PT Start Time 0932   PT Stop Time 1015   PT Time Calculation (min) 43 min   Activity Tolerance Patient tolerated treatment well   Behavior During Therapy [facility] for tasks assessed/performed      Past Medical History:  Diagnosis Date  . Anxiety   . Arthritis   . Cancer ([place]) 10/08/2016   left breast cancer  . Diabetes mellitus without complication ([place])   . Dyslipidemia   . Family history of breast cancer   . Family history of ovarian cancer   . Family history of prostate cancer   . GERD (gastroesophageal reflux disease)    nexium if needed  . Heart murmur   . Hypertension   . Hypothyroid   . Osteopenia   . Pneumonia 10/08/2016   June 2013    Past Surgical History:  Procedure Laterality Date  . ABDOMINAL HYSTERECTOMY    . BREAST RECONSTRUCTION WITH PLACEMENT OF TISSUE EXPANDER AND FLEX HD (ACELLULAR HYDRATED DERMIS) Left 10/13/2016   Procedure: LEFT BREAST RECONSTRUCTION WITH PLACEMENT OF TISSUE EXPANDER AND ALLODERM;  Surgeon: Irene Limbo, MD;  Location: [place];  Service: Plastics;  Laterality: Left;  . FOOT SURGERY     bi-lat/ repaired hammer toes  . MASTECTOMY W/ SENTINEL NODE BIOPSY Left 10/13/2016  . MASTECTOMY W/ SENTINEL NODE BIOPSY Left 10/13/2016   Procedure: LEFT TOTAL MASTECTOMY WITH LEFT AXILLARY SENTINEL LYMPH NODE BIOPSY;  Surgeon: Coralie Keens, MD;  Location: [place];  Service: General;  Laterality: Left;  . PORTACATH PLACEMENT Right 11/24/2016   Procedure: INSERTION PORT-A-CATH;  Surgeon: Fanny Skates, MD;   Location: [place];  Service: General;  Laterality: Right;  . TUBAL LIGATION    . VIDEO ASSISTED THORACOSCOPY (VATS)/WEDGE RESECTION Left 02/25/2015   Procedure: VIDEO ASSISTED THORACOSCOPY (VATS)/ LUL WEDGE RESECTION with ON Q placement.;  Surgeon: Melrose Nakayama, MD;  Location: [place];  Service: Thoracic;  Laterality: Left;    There were no vitals filed for this visit.      Subjective Assessment - 12/28/16 0933    Subjective I am feeling a little bit nauseated today. I don't know what it is. Maybe it was because I tried to eat this morning. I haven't been sleeping good at night.   Pertinent History Presented following screening MMG with new left breast calcifications. 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 MRI showed mass and NME spanning 10.2 cm from anterior to posterior, including both biopsied lesions. No abnormal or enlarged LN, and no evidence of malignancy in the right breast. Final pathology mastectomy 0.3 cm IDC, 5.5 cm high grade DCIS focally 0.1 cm from medial margin, 2/2 SLN +, +LVI. 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 No/denies   Pain Score 0-No pain                         [facility] Adult PT Treatment/Exercise - 12/28/16 0001      Shoulder Exercises: Standing   Other Standing Exercises Instructed pt in Strenth ABC program through chest press today- held all stretches for 30 sec bilaterally, substituted pelvic tilts for ab curls, did not do bird/dog exercise     Shoulder Exercises: Pulleys   Flexion 2 minutes   ABduction 2 minutes     Shoulder Exercises: Therapy Ball   Flexion 10 reps   With forward lean into end of stretch   ABduction 10 reps  Lt UE with side lean into end of stretch                        Long Term Clinic Goals - 12/09/16 1328      CC Long Term Goal  #1   Title Pt will be able to independently verbalize lymphedema risk reduction practices   Baseline 12/09/16- pt able to recall independently   Time 4   Period Weeks   Status Achieved     CC Long Term Goal  #2   Title Pt will be independent in a home exercise program for continued strengthening and stretching   Time 4   Period Weeks   Status On-going     CC Long Term Goal  #3   Title Pt will report a 75% improvement in left lateral trunk swelling to allow improved comfort when left arm is at side   Baseline 75% as of 12/01/16, though she still feels it some.   Time 4   Period Weeks   Status Achieved     CC Long Term Goal  #4   Title Pt will demonstrate 160 degrees of left shoulder abduction to allow pt to reach out to sides.   Baseline 101, 12/09/16- 165 degrees   Time 4   Period Weeks   Status Achieved     CC Long Term Goal  #5   Title Pt will demonstrate 160 degrees of left shoulder flexion to allow her to reach items overhead   Baseline 120, 12/09/16- 142 degrees   Time 4   Period Weeks   Status On-going            Plan - 12/28/16 1203    Clinical Impression Statement Pt still feeling nauseated today from chemo. Began strength ABC program today and made it through chest press so mostly focused on stretching. Will add strengthening at next session. Pt felt fatigue with stretches especially those standing up.    Rehab Potential Good   Clinical Impairments Affecting Rehab Potential pt. has started chemotherapy   PT Frequency 2x / week   PT Duration 4 weeks   PT Treatment/Interventions ADLs/Self Care Home Management;Therapeutic exercise;Therapeutic activities;Patient/family education;Passive range of motion;Scar mobilization;Manual lymph drainage;Manual  techniques;Taping   PT Next Visit Plan Strength ABC continue after chest press, pulleys, ball, continue gentle AA/A/PROM to left shoulder, do MLD here if time allows, add to HEP   PT Home Exercise Plan supine scap exericses   Consulted and Agree with Plan of Care Patient      Patient will benefit from skilled therapeutic intervention in order to improve the following deficits and impairments:  Decreased knowledge of precautions, Increased edema, Decreased range of motion, Decreased strength, Postural dysfunction, Pain,  Increased fascial restricitons  Visit Diagnosis: Stiffness of left shoulder, not elsewhere classified  Acute pain of left shoulder  Muscle weakness (generalized)     Problem List Patient Active Problem List   Diagnosis Date Noted  . Encounter for antineoplastic chemotherapy 12/10/2016  . Hyperglycemia 12/10/2016  . DCIS (ductal carcinoma in situ) of breast 10/13/2016  . Genetic testing 08/10/2016  . Family history of breast cancer   . Family history of prostate cancer   . Family history of ovarian cancer   . Breast cancer of upper-inner quadrant of left female breast ([place]) 07/22/2016  . Cigarette smoker 05/20/2016  . 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 Dyspnea 06/24/2015  . Nodule of left lung 02/25/2015  . Hypertension 01/28/2015  . Hypothyroidism 01/28/2015  . GERD (gastroesophageal reflux disease) 01/28/2015  . Lung mass 01/07/2015    Allyson Sabal [facility] 12/28/2016, 12:06 PM  [place], [place], [address] Phone: [phone]   Fax:  [phone]  Name: Taylor Walsh MRN: [id number] Date of Birth: 1955/09/04  Manus Gunning, PT 12/28/16 12:06 PM

## 2016-12-30 ENCOUNTER — Ambulatory Visit: Payer: BC Managed Care – PPO

## 2016-12-30 DIAGNOSIS — M25512 Pain in left shoulder: Secondary | ICD-10-CM

## 2016-12-30 DIAGNOSIS — M25612 Stiffness of left shoulder, not elsewhere classified: Secondary | ICD-10-CM | POA: Diagnosis not present

## 2016-12-30 NOTE — Therapy (Signed)
[place], [place], [address] Phone: [phone]   Fax:  [phone]  Physical Therapy Treatment  Patient Details  Name: Taylor Walsh MRN: [id number] Date of Birth: 06/11/1955 Referring Provider: [place]  Encounter Date: 12/30/2016      PT End of Session - 12/30/16 1019    Visit Number 13   Number of Visits 16   Date for PT Re-Evaluation 01/06/17   PT Start Time 0938   PT Stop Time 1019   PT Time Calculation (min) 41 min   Activity Tolerance Treatment limited secondary to medical complications (Comment)      Past Medical History:  Diagnosis Date  . Anxiety   . Arthritis   . Cancer ([place]) 10/08/2016   left breast cancer  . Diabetes mellitus without complication ([place])   . Dyslipidemia   . Family history of breast cancer   . Family history of ovarian cancer   . Family history of prostate cancer   . GERD (gastroesophageal reflux disease)    nexium if needed  . Heart murmur   . Hypertension   . Hypothyroid   . Osteopenia   . Pneumonia 10/08/2016   June 2013    Past Surgical History:  Procedure Laterality Date  . ABDOMINAL HYSTERECTOMY    . BREAST RECONSTRUCTION WITH PLACEMENT OF TISSUE EXPANDER AND FLEX HD (ACELLULAR HYDRATED DERMIS) Left 10/13/2016   Procedure: LEFT BREAST RECONSTRUCTION WITH PLACEMENT OF TISSUE EXPANDER AND ALLODERM;  Surgeon: Irene Limbo, MD;  Location: [facility];  Service: Plastics;  Laterality: Left;  . FOOT SURGERY     bi-lat/ repaired hammer toes  . MASTECTOMY W/ SENTINEL NODE BIOPSY Left 10/13/2016  . MASTECTOMY W/ SENTINEL NODE BIOPSY Left 10/13/2016   Procedure: LEFT TOTAL MASTECTOMY WITH LEFT AXILLARY SENTINEL LYMPH NODE BIOPSY;  Surgeon: Coralie Keens, MD;  Location: [facility];  Service: General;  Laterality: Left;  . PORTACATH PLACEMENT Right 11/24/2016   Procedure: INSERTION PORT-A-CATH;  Surgeon: Fanny Skates, MD;  Location: [facility];   Service: General;  Laterality: Right;  . TUBAL LIGATION    . VIDEO ASSISTED THORACOSCOPY (VATS)/WEDGE RESECTION Left 02/25/2015   Procedure: VIDEO ASSISTED THORACOSCOPY (VATS)/ LUL WEDGE RESECTION with ON Q placement.;  Surgeon: Melrose Nakayama, MD;  Location: [facility];  Service: Thoracic;  Laterality: Left;    There were no vitals filed for this visit.      Subjective Assessment - 12/30/16 0939    Subjective Whatever we do today I need to sit or lay because I still feeling nauseous and lightheaded. It's just been coming and going.    Pertinent History Presented following screening MMG with new left breast calcifications. 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 MRI showed mass and NME spanning 10.2 cm from anterior to posterior, including both biopsied lesions. No abnormal or enlarged LN, and no evidence of malignancy in the right breast. Final pathology mastectomy 0.3 cm IDC, 5.5 cm high grade DCIS focally 0.1 cm from medial margin, 2/2 SLN +, +LVI. 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 No/denies  [facility] PT Assessment - 12/30/16 0001      AROM   Left Shoulder Flexion 144 Degrees   Left Shoulder ABduction 149 Degrees                     [facility] Adult PT Treatment/Exercise - 12/30/16 0001      Manual Therapy   Myofascial Release in supine: left UE myofascial pulling with movement into abduction and at Lt axilla   Passive ROM in supine to left shoulder for ir, er, abduction, D2 and flexion with stretch to tolerance                        Long Term Clinic Goals - 12/09/16 1328      CC Long Term  Goal  #1   Title Pt will be able to independently verbalize lymphedema risk reduction practices   Baseline 12/09/16- pt able to recall independently   Time 4   Period Weeks   Status Achieved     CC Long Term Goal  #2   Title Pt will be independent in a home exercise program for continued strengthening and stretching   Time 4   Period Weeks   Status On-going     CC Long Term Goal  #3   Title Pt will report a 75% improvement in left lateral trunk swelling to allow improved comfort when left arm is at side   Baseline 75% as of 12/01/16, though she still feels it some.   Time 4   Period Weeks   Status Achieved     CC Long Term Goal  #4   Title Pt will demonstrate 160 degrees of left shoulder abduction to allow pt to reach out to sides.   Baseline 101, 12/09/16- 165 degrees   Time 4   Period Weeks   Status Achieved     CC Long Term Goal  #5   Title Pt will demonstrate 160 degrees of left shoulder flexion to allow her to reach items overhead   Baseline 120, 12/09/16- 142 degrees   Time 4   Period Weeks   Status On-going            Plan - 12/30/16 1021    Clinical Impression Statement Pt reported feeling very nauseous today and wsa not able to do any activities in standig which is what all the remaining exercises for Strength ABC Program would be. So asked her if she wanted tofocus on end ROM stretching as she is still tight or cancel visit and she wanted to focus on stretching which she tolerated well. Educated pt on fact that if she is feeling to nauseous for therapy it's ok to cancel visit and R/S and she verbalized understanding this. She tolerated stretching well and her end ORM did visibly increase during session.    Rehab Potential Good   Clinical Impairments Affecting Rehab Potential pt. has started chemotherapy   PT Frequency 2x / week   PT Duration 4 weeks   PT Treatment/Interventions ADLs/Self Care Home Management;Therapeutic exercise;Therapeutic activities;Patient/family  education;Passive range of motion;Scar mobilization;Manual lymph drainage;Manual techniques;Taping   PT Next Visit Plan Strength ABC continue after chest press if pt feeling better, pulleys, ball, continue gentle AA/A/PROM to left shoulder, do MLD here if time allows, add to HEP   Consulted and Agree with Plan of Care Patient      Patient will benefit from skilled therapeutic intervention in order to improve the following deficits and  impairments:  Decreased knowledge of precautions, Increased edema, Decreased range of motion, Decreased strength, Postural dysfunction, Pain, Increased fascial restricitons  Visit Diagnosis: Stiffness of left shoulder, not elsewhere classified  Acute pain of left shoulder     Problem List Patient Active Problem List   Diagnosis Date Noted  . Encounter for antineoplastic chemotherapy 12/10/2016  . Hyperglycemia 12/10/2016  . DCIS (ductal carcinoma in situ) of breast 10/13/2016  . Genetic testing 08/10/2016  . Family history of breast cancer   . Family history of prostate cancer   . Family history of ovarian cancer   . Breast cancer of upper-inner quadrant of left female breast ([facility]) 07/22/2016  . Cigarette smoker 05/20/2016  . Pulmonary amyloidosis ([facility]) 06/24/2015  . Dyspnea 06/24/2015  . Nodule of left lung 02/25/2015  . Hypertension 01/28/2015  . Hypothyroidism 01/28/2015  . GERD (gastroesophageal reflux disease) 01/28/2015  . Lung mass 01/07/2015    Otelia Limes, PTA 12/30/2016, 10:24 AM  [place], [place], [address] Phone: [phone]   Fax:  [phone]  Name: Taylor Walsh MRN: [id number] Date of Birth: May 07, 1955

## 2016-12-31 ENCOUNTER — Telehealth: Payer: Self-pay

## 2016-12-31 NOTE — Telephone Encounter (Signed)
Received faxed lab results from pt's PCP Dr. Dione Housekeeper.  Left results on Dr. Ernestina Penna desk for review.

## 2016-12-31 NOTE — Telephone Encounter (Signed)
Pt had labs this AM at Dr Marton Redwood. She was told her WBCs were low. She wanted Dr Burr Medico to be aware. This RN called Dr Raul Del office to have a copy of the labs faxed to Dr Ernestina Penna pod.   C4 of adria cytoxan is due on 10/10

## 2017-01-04 ENCOUNTER — Ambulatory Visit: Payer: BC Managed Care – PPO | Admitting: Physical Therapy

## 2017-01-05 NOTE — Progress Notes (Signed)
[facility]  [phone] [phone]  Clinic Follow-up Note   Patient Care Team: Marton Redwood, MD as PCP - General (Internal Medicine) Melrose Nakayama, MD as Consulting Physician (Cardiothoracic Surgery) Fanny Skates, MD as Consulting Physician (General Surgery) Truitt Merle, MD as Consulting Physician (Hematology) 01/06/2017   CHIEF COMPLAINTS F/U left breast cancer   Oncology History   Cancer Staging Breast cancer of upper-inner quadrant of left female breast [facility]) Staging form: Breast, AJCC 8th Edition - Clinical stage from 07/10/2016: Stage 0 (cTis (DCIS), cN0, cM0, G3, ER: Positive, PR: Positive, HER2: Not Assessed) - Signed by Truitt Merle, MD on 07/28/2016 - Pathologic stage from 10/26/2016: Stage IA (pT1a, pN1a, cM0, G2, ER: Positive, PR: Positive, HER2: Negative) - Signed by Truitt Merle, MD on 11/11/2016       Breast cancer of upper-inner quadrant of left female breast ([place])   07/10/2016 Initial Biopsy    Diagnosis 1. 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 2. 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      07/10/2016 Receptors her2    ER 95%, PR 80-90%+, both strong staining, Ki67 80-90%      07/22/2016 Initial Diagnosis    Ductal carcinoma in situ (DCIS) of left breast     07/27/2016 Imaging    Breast MRI w wo contrast showed  1. Extensive mass and non mass enhancement along the lower inner aspect the left breast, lower outer quadrant, spanning 10.2 cm from anterior to posterior, including both biopsied lesions, as detailed above. This is all consistent with DCIS. 2. No other abnormal enhancement in the left breast. 3. No abnormal or enlarged axillary lymph nodes. 4. No evidence of malignancy in the right breast      08/10/2016 Genetic Testing    ATM c.4066A>G VUS identified on the Common Hereditary Cancer panel. The Hereditary Gene Panel offered by [facility] includes sequencing and/or deletion duplication testing of the following 46 genes: APC, ATM, AXIN2, BARD1, BMPR1A, BRCA1, BRCA2, BRIP1, CDH1, CDKN2A (p14ARF), CDKN2A (p16INK4a), CHEK2, CTNNA1, DICER1, EPCAM (Deletion/duplication testing only), GREM1 (promoter region deletion/duplication testing only), KIT, MEN1, MLH1, MSH2, MSH3, MSH6, MUTYH, NBN, NF1, NHTL1, PALB2, PDGFRA, PMS2, POLD1, POLE, PTEN, RAD50, RAD51C, RAD51D, SDHB, SDHC, SDHD, SMAD4, SMARCA4. STK11, TP53, TSC1, TSC2, and VHL.  The following gene was evaluated for sequence changes only: SDHA and HOXB13 c.251G>A variant only.  The report date is Aug 09, 2016.       10/13/2016 Surgery    LEFT TOTAL MASTECTOMY WITH LEFT AXILLARY SENTINEL LYMPH NODE BIOPSY By Dr. Ninfa Linden      10/13/2016 Pathology Results    Surgery Diagnosis 10/13/16  1. Lymph node, sentinel, biopsy, Left Axillary - METASTATIC CARCINOMA IN ONE LYMPH NODE (1/1). 2. Lymph node, sentinel, biopsy, Left - METASTATIC CARCINOMA IN ONE LYMPH NODE (1/1). 3. Breast, radical mastectomy (including lymph nodes), Left - INVASIVE DUCTAL CARCINOMA, 0.3 CM. - LYMPHOVASCULAR INVOLVEMENT BY CARCINOMA. - HIGH GRADE DUCTAL CARCINOMA IN SITU WITH CALCIFICATIONS AND NECROSIS, 5.5 CM. - DCIS FOCALLY 0.1 CM FROM CAUTERIZED MEDIAL MARGIN. - SEE ONCOLOGY TABLE AND COMMENT. 4. Skin , Left additional mastectomy flap - BENIGN FIBROADIPOSE TISSUE. - NO EVIDENCE OF MALIGNANCY.      10/30/2016 Miscellaneous    MammaPrint 10/31/26 Result:  High Risk,Luminal type B MPI: -0.368 Average 10-year risk of  recurrence untreated: 29%      11/18/2016 Imaging    CT CAP W Contrast 11/18/16 IMPRESSION: 1. Skin thickening and interstitial changes involving the left breast, likely related to radiation. Surgical changes are also noted with a tissue expander in  place. Probable postop fluid collection in the upper outer quadrant breast. No enlarged supraclavicular or axillary lymph nodes. 2. No CT findings suggesting metastatic disease involving the chest, abdomen or pelvis or bony structures. 3. Surgical changes from a left upper lobe wedge resection. No findings for recurrent tumor.      11/23/2016 Imaging    Bone Scan Whole body 11/23/16 IMPRESSION: No definite scintigraphic evidence of osseous metastatic disease as above.      11/25/2016 -  Chemotherapy    Adjuvant chemotherapy AC every 2 weeks for 4 cycles followed by Taxol weekly for 12 cycles starting 11/25/16         HISTORY OF PRESENTING ILLNESS (07/28/16):  Taylor Walsh 61 y.o. 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 She presents to the clinic today by herself. She was referred by her surgeon Dr. Dalbert Batman.   This was discovered by routine screening mammogram. She denies any palpable breast mass or adenopathy. She denies any other new symptoms. She was contacted for her need of a biopsy to further investigate the calcification found in her mammogram. A biopsy was done leading to a diagnosis of her DCIS. She reports having arthritis in the right knee. She reported having a tubal ligation and hysterectomy. She also has famaily history of breast cancer with mother and sister. She reports to live with her daughter and 2 of her grandchildren. She reports of having history of HTN, arthritis, Thyroidism, being Pre-diabetic, Osteopenia and is currently back to Smoking. She also reports her brother has a history of blood clots and he was told that it was hereditary which she is following up with Dr. Brigitte Pulse about testing.   GYN HISTORY  Menarchal: 11 LMP: 36 before hysterectomy Contraceptive: N/A HRT: no GP:G2P2    CURRENT THERAPY: Adjuvant chemotherapy AC every 2 weeks for 4 cycles 11/25/16-01/05/17 followed by Taxol weekly for 12 cycles starting 01/20/17   INTERVAL  HISTORY:  Taylor Walsh is here for a follow up and last cycle AC. She presents to clinic today reporting her last chemo was worse. Her taste has changed but she is able to keep food down, she has no nausea. She lost 7 pounds but makes sure she eats. She will occasionally gag if she eats too much. Her takes ensure boosts but that  makes her gag. She does take antinausea medication but does not really help her. She notes her tongue is still black and wonders will this resolve with her next chemotherapy. Dr. Brigitte Pulse did her blood counts on 12/31/16 and the CBCs were low.  She wanted to confirm her dates for her FMLA paperwork. She does not know if or when she should fully to return to work due to the future treatments she has.  Her cough has improved and is now occasionally. She now has post nasal drip and has to clear her throat. We will hold off on her flu shot until next visit.     MEDICAL HISTORY:  Past Medical History:  Diagnosis Date  . Anxiety   . Arthritis   . Cancer ([place]) 10/08/2016   left breast cancer  . Diabetes mellitus without complication ([place])   . Dyslipidemia   . Family history  of breast cancer   . Family history of ovarian cancer   . Family history of prostate cancer   . GERD (gastroesophageal reflux disease)    nexium if needed  . Heart murmur   . Hypertension   . Hypothyroid   . Osteopenia   . Pneumonia 10/08/2016   June 2013    SURGICAL HISTORY: Past Surgical History:  Procedure Laterality Date  . ABDOMINAL HYSTERECTOMY    . BREAST RECONSTRUCTION WITH PLACEMENT OF TISSUE EXPANDER AND FLEX HD (ACELLULAR HYDRATED DERMIS) Left 10/13/2016   Procedure: LEFT BREAST RECONSTRUCTION WITH PLACEMENT OF TISSUE EXPANDER AND ALLODERM;  Surgeon: Irene Limbo, MD;  Location: [facility];  Service: Plastics;  Laterality: Left;  . FOOT SURGERY     bi-lat/ repaired hammer toes  . MASTECTOMY W/ SENTINEL NODE BIOPSY Left 10/13/2016  . MASTECTOMY W/ SENTINEL NODE BIOPSY Left 10/13/2016    Procedure: LEFT TOTAL MASTECTOMY WITH LEFT AXILLARY SENTINEL LYMPH NODE BIOPSY;  Surgeon: Coralie Keens, MD;  Location: [facility];  Service: General;  Laterality: Left;  . PORTACATH PLACEMENT Right 11/24/2016   Procedure: INSERTION PORT-A-CATH;  Surgeon: Fanny Skates, MD;  Location: [facility];  Service: General;  Laterality: Right;  . TUBAL LIGATION    . VIDEO ASSISTED THORACOSCOPY (VATS)/WEDGE RESECTION Left 02/25/2015   Procedure: VIDEO ASSISTED THORACOSCOPY (VATS)/ LUL WEDGE RESECTION with ON Q placement.;  Surgeon: Melrose Nakayama, MD;  Location: [facility];  Service: Thoracic;  Laterality: Left;    SOCIAL HISTORY: Social History   Social History  . Marital status: Single    Spouse name: N/A  . Number of children: 2  . Years of education: N/A   Occupational History  . Not on file.   Social History Main Topics  . Smoking status: Former Smoker    Packs/day: 1.00    Years: 42.00    Quit date: 08/27/2016  . Smokeless tobacco: Never Used     Comment: 5/1/18she smokes occasionally when stressed. She is smoking 4 cigarettes a week  . Alcohol use No  . Drug use: No  . Sexual activity: Not on file   Other Topics Concern  . Not on file   Social History Narrative  . No narrative on file    FAMILY HISTORY: Family History  Problem Relation Age of Onset  . Cancer Mother        brain, ovarian  . Hypertension Mother   . Stroke Father   . Hypertension Father   . Cancer Sister 84       bilateral breast cancer, 2nd cancer at 56  . Cancer Brother        dx with prostate cancer in the 38s  . Cancer Brother        dx in his 72s with prostate cancer  . Brain cancer Maternal Uncle   . Cirrhosis Brother   . Cancer Brother        NOS  . Cancer Paternal Aunt        NOS  . Cancer Cousin        paternal cousin with cancer NOS    ALLERGIES:  is allergic to vicodin [hydrocodone-acetaminophen].  MEDICATIONS:  Current Outpatient Prescriptions  Medication Sig  Dispense Refill  . aspirin 81 MG tablet Take 81 mg by mouth daily.    Marland Kitchen atorvastatin (LIPITOR) 40 MG tablet Take 40 mg by mouth daily.    . calcium-vitamin D 250-100 MG-UNIT tablet Take 2 tablets by mouth daily.     Marland Kitchen  ergocalciferol (VITAMIN D2) 50000 UNITS capsule Take 50,000 Units by mouth every Friday.     Marland Kitchen guaiFENesin-codeine 100-10 MG/5ML syrup Take 5 mLs by mouth every 6 (six) hours as needed for cough. 120 mL 0  . HYDROcodone-acetaminophen (NORCO) 5-325 MG tablet Take 1-2 tablets by mouth every 6 (six) hours as needed for moderate pain or severe pain. 20 tablet 0  . ibuprofen (ADVIL,MOTRIN) 600 MG tablet Take 1 tablet (600 mg total) by mouth every 8 (eight) hours as needed. 15 tablet 0  . levothyroxine (SYNTHROID, LEVOTHROID) 88 MCG tablet Take 88 mcg by mouth daily before breakfast.     . lidocaine-prilocaine (EMLA) cream Apply to affected area once 30 g 3  . metFORMIN (GLUCOPHAGE) 500 MG tablet Take 1 tablet (500 mg total) by mouth 2 (two) times daily with a meal. 60 tablet 0  . methocarbamol (ROBAXIN) 500 MG tablet Take 1 tablet (500 mg total) by mouth every 8 (eight) hours as needed for muscle spasms. 30 tablet 0  . olmesartan (BENICAR) 20 MG tablet Take 20 mg by mouth daily.    . ondansetron (ZOFRAN) 8 MG tablet Take 1 tablet (8 mg total) by mouth 2 (two) times daily as needed. Start on the third day after chemotherapy. 30 tablet 1  . prochlorperazine (COMPAZINE) 10 MG tablet Take 1 tablet (10 mg total) by mouth every 6 (six) hours as needed (Nausea or vomiting). 30 tablet 1  . traMADol (ULTRAM) 50 MG tablet Take 50 mg by mouth every 6 (six) hours as needed for moderate pain or severe pain.     . valACYclovir (VALTREX) 1000 MG tablet Take 1,000 mg by mouth daily as needed (for sores).      No current facility-administered medications for this visit.     REVIEW OF SYSTEMS:  Constitutional: Denies fevers, chills (+) night sweats (+) taste change (+) weight loss Eyes: Denies  blurriness of vision, double vision or watery eyes Ears, nose, mouth, throat, and face: (+) tongue is black, residual from chemo Respiratory: Denies dyspnea or wheezes (+) Dry cough, improved Cardiovascular: Denies palpitation, chest discomfort or lower extremity swelling Gastrointestinal:  Denies nausea, heartburn or change in bowel habits (+) gagging Skin: Denies abnormal skin rashes Lymphatics: Denies new lymphadenopathy or easy bruising Musculoskeletal: (+) arthritis in the right knee Neurological:Denies numbness, tingling or new weaknesses Breast: negative  Behavioral/Psych: Mood is stable, no new changes  All other systems were reviewed with the patient and are negative.  PHYSICAL EXAMINATION: ECOG PERFORMANCE STATUS: 1  Vitals:   01/06/17 1047  BP: 119/60  Pulse: 93  Resp: 18  Temp: 98.5 F (36.9 C)  SpO2: 100%   Filed Weights   01/06/17 1047  Weight: 191 lb 1.6 oz (86.7 kg)    GENERAL:alert, no distress and comfortable SKIN: skin color, texture, turgor are normal, no rashes or significant lesions EYES: normal, conjunctiva are pink and non-injected, sclera clear OROPHARYNX:no exudate, no erythema and lips, buccal mucosa, and tongue normal  (+) Mild Mucositis  NECK: supple, thyroid normal size, non-tender, without nodularity LYMPH:  no palpable lymphadenopathy in the cervical, axillary or inguinal LUNGS: clear to auscultation and percussion with normal breathing effort HEART: regular rate & rhythm and no murmurs and no lower extremity edema ABDOMEN:abdomen soft, non-tender and normal bowel sounds Musculoskeletal:no cyanosis of digits and no clubbing  PSYCH: alert & oriented x 3 with fluent speech NEURO: no focal motor/sensory deficits Breasts: Breast inspection showed them to be symmetrical with no nipple discharge. (+)  S/p mastectomy with tissue expander placement with incision elated very well, some tenderness.   LABORATORY DATA:  I have reviewed the data as  listed CBC Latest Ref Rng & Units 01/06/2017 12/23/2016 12/10/2016  WBC 3.9 - 10.3 10e3/uL 11.8(H) 8.4 5.4  Hemoglobin 11.6 - 15.9 g/dL 10.5(L) 11.7 12.1  Hematocrit 34.8 - 46.6 % 31.7(L) 35.1 37.2  Platelets 145 - 400 10e3/uL 298 187 268    CMP Latest Ref Rng & Units 01/06/2017 12/23/2016 12/10/2016  Glucose 70 - 140 mg/dl 114 106 219(H)  BUN 7.0 - 26.0 mg/dL 4.4(L) 5.3(L) 8.9  Creatinine 0.6 - 1.1 mg/dL 0.7 0.8 1.0  Sodium 136 - 145 mEq/L 141 139 138  Potassium 3.5 - 5.1 mEq/L 3.5 3.5 4.0  Chloride 101 - 111 mmol/L - - -  CO2 22 - 29 mEq/L _0 Calcium 8.4 - 10.4 mg/dL 9.0 9.2 9.5  Total Protein 6.4 - 8.3 g/dL 6.8 7.0 7.1  Total Bilirubin 0.20 - 1.20 mg/dL 0.38 0.32 0.28  Alkaline Phos 40 - 150 U/L 112 103 109  AST 5 - 34 U/L 40(H) 30 18  ALT 0 - 55 U/L 52 34 29    PATHOLOGY:   Surgery Diagnosis 10/13/16  1. Lymph node, sentinel, biopsy, Left Axillary - METASTATIC CARCINOMA IN ONE LYMPH NODE (1/1). 2. Lymph node, sentinel, biopsy, Left - METASTATIC CARCINOMA IN ONE LYMPH NODE (1/1). 3. Breast, radical mastectomy (including lymph nodes), Left - INVASIVE DUCTAL CARCINOMA, 0.3 CM. - LYMPHOVASCULAR INVOLVEMENT BY CARCINOMA. - HIGH GRADE DUCTAL CARCINOMA IN SITU WITH CALCIFICATIONS AND NECROSIS, 5.5 CM. - DCIS FOCALLY 0.1 CM FROM CAUTERIZED MEDIAL MARGIN. - SEE ONCOLOGY TABLE AND COMMENT. 4. Skin , Left additional mastectomy flap - BENIGN FIBROADIPOSE TISSUE. - NO EVIDENCE OF MALIGNANCY. Microscopic Comment 3. BREAST, INVASIVE TUMOR Procedure: Mastectomy and two sentinel lymph nodes with additional left mastectomy flap. Laterality: Left breast Tumor Size: 0.3 cm invasive carcinoma; 5.5 cm DCIS Histologic Type: Ductal Grade: II Tubular Differentiation: 2 Nuclear Pleomorphism: 2 Mitotic Count: 2 Ductal Carcinoma in Situ (DCIS): Present, high grade with necrosis Extent of Tumor: Skin: Free of tumor Nipple: Free of tumor Microscopic Comment(continued) Skeletal muscle:  Free of tumor Margins: Free of tumor Invasive carcinoma, distance from closest margin: Greater than 1.5 cm DCIS, distance from closest margin: Less than 0.1 cm from medial margin Regional Lymph Nodes: Number of Lymph Nodes Examined: 2 Number of Sentinel Lymph Nodes Examined: 2 Lymph Nodes with Macrometastases: 2 Lymph Nodes with Micrometastases: 0 Lymph Nodes with Isolated Tumor Cells: 0 Breast Prognostic Profile: Pending Estrogen Receptor: Pending Progesterone Receptor: Pending Her2: Pending Ki-67: Pending Best tumor block for sendout testing: 3[facility] Pathologic Stage Classification (pTNM, AJCC 8th Edition): Primary Tumor (pT): pT1a Regional Lymph Nodes (pN): pN1a Distant Metastases (pM): pMX Comments: There is extensive high grade ductal carcinoma in situ with necrosis. Multiple additional portions of the tissue are examined and there is a 0.3 cm invasive ductal carcinoma associated with lymphovascular involvement by tumor. Breast prognostic profile will be performed on the invasive carcinoma and reported as an addendum. Immunohistochemistry shows positive basal cell staining in the DCIS with calponin, smooth muscle myosin and p63. ADDITIONAL INFORMATION: 3. By immunohistochemistry, the tumor cells are Negative for Her2 (1+). Vicente Males MD Pathologist, Electronic Signature ( Signed 10/21/2016) 3. PROGNOSTIC INDICATORS For Part 3[facility] Results: IMMUNOHISTOCHEMICAL AND MORPHOMETRIC ANALYSIS PERFORMED MANUALLY Estrogen Receptor: 95%, POSITIVE, STRONG STAINING INTENSITY Progesterone Receptor: 45%, POSITIVE, STRONG STAINING INTENSITY Proliferation Marker Ki67: 15% ADDITIONAL INFORMATION:(continued) Results:  HER2 - *EQUIVOCAL*. OF NOTE, A TOTAL OF 40 TUMOR CELLS WERE EVALUATED FOR HER2 EXPRESSION. HER2 BY IMMUNOHISTOCHEMISTRY WILL BE PERFORMED AND THE RESULTS REPORTED SEPARATELY. RATIO OF HER2/CEP17 SIGNALS 1.67 AVERAGE HER2 COPY NUMBER PER CELL 4.18   Surgery: 07/10/16 Diagnosis 1.  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 - SEE MICROSCOPIC DESCRIPTION. 2. 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 - SEE MICROSCOPIC DESCRIPTION. Microscopic Comment 1. Prognostic markers have been ordered and will be reported in an addendum. 2. Prognostic markers have been ordered and will be reported in an addendum. Called to the [facility] on 07/13/16. Results: IMMUNOHISTOCHEMICAL AND MORPHOMETRIC ANALYSIS PERFORMED MANUALLY Estrogen Receptor: 95%, POSITIVE, STRONG STAINING INTENSITY Progesterone Receptor: 80%, POSITIVE, STRONG STAINING INTENSITY  Biopsy: 02/25/15 Diagnosis 1. 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 - NO ATYPIA OR MALIGNANCY IDENTIFIED. - SEE COMMENT. 2. Lymph node, biopsy, 12 L - ONE BENIGN LYMPH NODE WITH NO TUMOR SEEN (0/1). 3. Lymph node, biopsy, 9 L - ONE BENIGN LYMPH NODE WITH NO TUMOR SEEN (0/1). Microscopic Comment 1. A Congo Red stain is performed and a crystal violet stain is performed on the lung specimen. The staining pattern (apple green birefringence) coupled with the morphology is consistent with amyloid deposition. Dr. Zenda Alpers, Dr. Fletcher Anon and Dr. Darl Householder have all seen this case in consultation with agreement that amyloid deposition is present. Dr. Donato Heinz has also seen this case in consultation with agreement of the additional findings in the case.   MammaPrint 10/31/26 Result:  High Risk at 94.6% MPI: -0.368 Average 10-year risk of recurrence untreated: 29%   Genetics 07/29/16    PROCEDURES  ECHO 11/17/16 Study Conclusions - Left ventricle: The cavity size was normal. Wall  thickness was   normal. Systolic function was normal. The estimated ejection   fraction was in the range of 60% to 65%. Wall motion was normal;   there were no regional wall motion abnormalities. Features are   consistent with a pseudonormal left ventricular filling pattern,   with concomitant abnormal relaxation and increased filling   pressure (grade 2 diastolic dysfunction). - Aortic valve: There was mild regurgitation. Valve area (VTI):   1.84 cm^2. Valve area (Vmax): 1.76 cm^2. Valve area (Vmean): 1.89   cm^2.    RADIOGRAPHIC STUDIES: I have personally reviewed the radiological images as listed and agreed with the findings in the report. No results found.  ASSESSMENT & PLAN:  Taylor Walsh is 61 y.o. who present to the clinic with Ductal carcinoma in situ (DCIS) of left breast  1. 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 - She had mostly DCIS in her breast but There was small component of invasive cancer that has spread to 2 of her lymph nodes.  Her surgical margins were negative. -Her mammaprint showed high-risk disease, luminal type B -Her staging scan was negative for distant metastasis, or residual axillary adenopathy. --I recommended adjuvant chemotherapy with dose dense AC every 2 weeks for 4 cycles, followed by weekly Taxol for 12 weeks. The course of  therapy is curative.  -Pt started AC chemo on 11/25/16 and is tolerating well overall.  -I discussed her symptoms with Taxol will be less than her current chemotherapy. It will take some time for her symptoms to resolve.  -Lab reviewed and her Counts have improved since her labs with Dr. Brigitte Pulse. She is still slightly anemic but does not need a blood transfusion. She is adequate to complete her AC chemo  today.  -I discussed her amyloidosis urine test, this is to verify or rule out systemic amyloidosis.  -She plans to do breast reconstruction after Taxol and radiation.  -Labs improved and adequate to continue with [facility] cycle 4 today and start Taxol weekly in 2 weeks -F/u in 2 weeks    2.Arthritis -She experiences mild arthritis in LE, more specific to her right knee -She is pretty active   3. Osteopenia -Last bone density scan ordered by Dr Brigitte Pulse in the last 2 years -Will f/u with PCP and scans every 2 years  4. Smoking cessation -patient stopped smoking but after close friend passing she has started again smoking.  -She smokes approximately 1 pack a week.  -The patient has quit smoking in 07/2016  5. 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 The Hereditary Gene Panel offered by [facility] includes sequencing and/or deletion duplication testing of the following 46 genes: APC, ATM, AXIN2, BARD1, BMPR1A, BRCA1, BRCA2, BRIP1, CDH1, CDKN2A (p14ARF), CDKN2A (p16INK4a), CHEK2, CTNNA1, DICER1, EPCAM (Deletion/duplication testing only), GREM1 (promoter region deletion/duplication testing only), KIT, MEN1, MLH1, MSH2, MSH3, MSH6, MUTYH, NBN, NF1, NHTL1, PALB2, PDGFRA, PMS2, POLD1, POLE, PTEN, RAD50, RAD51C, RAD51D, SDHB, SDHC, SDHD, SMAD4, SMARCA4. STK11, TP53, TSC1, TSC2, and VHL.  The following gene was evaluated for sequence changes only: SDHA and HOXB13 c.251G>A variant only.  The report date is Aug 09, 2016.  6. Dry cough, bronchitis  - she has developed some dry cough since 12/20/2016, no chest discomfort, sputum production, fever or chills. (+) Sick contact. -This is likely virus upper respiratory infection, I do not think she needs antibiotics for now. -She knows to contact us if her cough worsens or does not resolve with medication. If needed we will get a chest XRay -previously prescribed Hycodan to take at night to help along with fluids and rest  -She denies  being allergic to hydrocodone/codein  -Will hold flu shot until the resolves or improves.  -Cough improved but with post nasal drip. Will continue to hold flu shot until next visit  7. 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 History of pulmonary amyloidosis -She was found to have a lung nodule in October 2016, which was surgically removed, and showed pulmonary amyloidosis -Previous lab showed no evidence of systemic amyloidosis -Continue monitoring  PLAN:  -Labs adequate today to Continue with cycle 4 of AC  -Lab, flush, f/u and taxol in 2 weeks, will give flu shot on next visit     No orders of the defined types were placed in this encounter.   All questions were answered. The patient knows to call the clinic with any problems, questions or concerns.     Truitt Merle, MD 01/06/2017   This document serves as a record of services personally performed by Truitt Merle, MD. It was created on her behalf by Joslyn Devon, a trained medical scribe.  The creation of this record is based on the scribe's personal observations and the provider's statements to them. This document has been checked and approved by the attending provider.

## 2017-01-06 ENCOUNTER — Ambulatory Visit: Payer: BC Managed Care – PPO

## 2017-01-06 ENCOUNTER — Encounter: Payer: Self-pay | Admitting: Hematology

## 2017-01-06 ENCOUNTER — Ambulatory Visit (HOSPITAL_BASED_OUTPATIENT_CLINIC_OR_DEPARTMENT_OTHER): Payer: BC Managed Care – PPO

## 2017-01-06 ENCOUNTER — Other Ambulatory Visit (HOSPITAL_BASED_OUTPATIENT_CLINIC_OR_DEPARTMENT_OTHER): Payer: BC Managed Care – PPO

## 2017-01-06 ENCOUNTER — Ambulatory Visit (HOSPITAL_BASED_OUTPATIENT_CLINIC_OR_DEPARTMENT_OTHER): Payer: BC Managed Care – PPO | Admitting: Hematology

## 2017-01-06 VITALS — BP 119/60 | HR 93 | Temp 98.5°F | Resp 18 | Ht 63.0 in | Wt 191.1 lb

## 2017-01-06 DIAGNOSIS — M8580 Other specified disorders of bone density and structure, unspecified site: Secondary | ICD-10-CM | POA: Diagnosis not present

## 2017-01-06 DIAGNOSIS — Z5189 Encounter for other specified aftercare: Secondary | ICD-10-CM | POA: Diagnosis not present

## 2017-01-06 DIAGNOSIS — E854 Organ-limited amyloidosis: Secondary | ICD-10-CM | POA: Diagnosis not present

## 2017-01-06 DIAGNOSIS — R05 Cough: Secondary | ICD-10-CM

## 2017-01-06 DIAGNOSIS — C50212 Malignant neoplasm of upper-inner quadrant of left female breast: Secondary | ICD-10-CM

## 2017-01-06 DIAGNOSIS — Z170 Estrogen receptor positive status [ER+]: Secondary | ICD-10-CM

## 2017-01-06 DIAGNOSIS — Z5111 Encounter for antineoplastic chemotherapy: Secondary | ICD-10-CM

## 2017-01-06 DIAGNOSIS — J99 Respiratory disorders in diseases classified elsewhere: Secondary | ICD-10-CM

## 2017-01-06 DIAGNOSIS — D6481 Anemia due to antineoplastic chemotherapy: Secondary | ICD-10-CM | POA: Diagnosis not present

## 2017-01-06 DIAGNOSIS — I10 Essential (primary) hypertension: Secondary | ICD-10-CM | POA: Diagnosis not present

## 2017-01-06 DIAGNOSIS — Z95828 Presence of other vascular implants and grafts: Secondary | ICD-10-CM

## 2017-01-06 LAB — COMPREHENSIVE METABOLIC PANEL
ALBUMIN: 3.6 g/dL (ref 3.5–5.0)
ALK PHOS: 112 U/L (ref 40–150)
ALT: 52 U/L (ref 0–55)
ANION GAP: 10 meq/L (ref 3–11)
AST: 40 U/L — ABNORMAL HIGH (ref 5–34)
BILIRUBIN TOTAL: 0.38 mg/dL (ref 0.20–1.20)
BUN: 4.4 mg/dL — ABNORMAL LOW (ref 7.0–26.0)
CALCIUM: 9 mg/dL (ref 8.4–10.4)
CO2: 27 mEq/L (ref 22–29)
Chloride: 105 mEq/L (ref 98–109)
Creatinine: 0.7 mg/dL (ref 0.6–1.1)
GLUCOSE: 114 mg/dL (ref 70–140)
Potassium: 3.5 mEq/L (ref 3.5–5.1)
Sodium: 141 mEq/L (ref 136–145)
TOTAL PROTEIN: 6.8 g/dL (ref 6.4–8.3)

## 2017-01-06 LAB — CBC WITH DIFFERENTIAL/PLATELET
BASO%: 0.2 % (ref 0.0–2.0)
BASOS ABS: 0 10*3/uL (ref 0.0–0.1)
EOS ABS: 0 10*3/uL (ref 0.0–0.5)
EOS%: 0.3 % (ref 0.0–7.0)
HCT: 31.7 % — ABNORMAL LOW (ref 34.8–46.6)
HEMOGLOBIN: 10.5 g/dL — AB (ref 11.6–15.9)
LYMPH%: 13.1 % — ABNORMAL LOW (ref 14.0–49.7)
MCH: 28.1 pg (ref 25.1–34.0)
MCHC: 33.1 g/dL (ref 31.5–36.0)
MCV: 84.8 fL (ref 79.5–101.0)
MONO#: 1.2 10*3/uL — AB (ref 0.1–0.9)
MONO%: 10.3 % (ref 0.0–14.0)
NEUT%: 76.1 % (ref 38.4–76.8)
NEUTROS ABS: 9 10*3/uL — AB (ref 1.5–6.5)
PLATELETS: 298 10*3/uL (ref 145–400)
RBC: 3.74 10*6/uL (ref 3.70–5.45)
RDW: 16.1 % — AB (ref 11.2–14.5)
WBC: 11.8 10*3/uL — AB (ref 3.9–10.3)
lymph#: 1.6 10*3/uL (ref 0.9–3.3)

## 2017-01-06 MED ORDER — SODIUM CHLORIDE 0.9% FLUSH
10.0000 mL | Freq: Once | INTRAVENOUS | Status: AC
  Administered 2017-01-06: 10 mL
  Filled 2017-01-06: qty 10

## 2017-01-06 MED ORDER — DOXORUBICIN HCL CHEMO IV INJECTION 2 MG/ML
60.0000 mg/m2 | Freq: Once | INTRAVENOUS | Status: AC
  Administered 2017-01-06: 120 mg via INTRAVENOUS
  Filled 2017-01-06: qty 60

## 2017-01-06 MED ORDER — PALONOSETRON HCL INJECTION 0.25 MG/5ML
INTRAVENOUS | Status: AC
  Filled 2017-01-06: qty 5

## 2017-01-06 MED ORDER — PEGFILGRASTIM 6 MG/0.6ML ~~LOC~~ PSKT
6.0000 mg | PREFILLED_SYRINGE | Freq: Once | SUBCUTANEOUS | Status: AC
  Administered 2017-01-06: 6 mg via SUBCUTANEOUS
  Filled 2017-01-06: qty 0.6

## 2017-01-06 MED ORDER — PALONOSETRON HCL INJECTION 0.25 MG/5ML
0.2500 mg | Freq: Once | INTRAVENOUS | Status: AC
  Administered 2017-01-06: 0.25 mg via INTRAVENOUS

## 2017-01-06 MED ORDER — SODIUM CHLORIDE 0.9 % IV SOLN
Freq: Once | INTRAVENOUS | Status: AC
  Administered 2017-01-06: 12:00:00 via INTRAVENOUS

## 2017-01-06 MED ORDER — HEPARIN SOD (PORK) LOCK FLUSH 100 UNIT/ML IV SOLN
500.0000 [IU] | Freq: Once | INTRAVENOUS | Status: AC | PRN
  Administered 2017-01-06: 500 [IU]
  Filled 2017-01-06: qty 5

## 2017-01-06 MED ORDER — SODIUM CHLORIDE 0.9 % IV SOLN
Freq: Once | INTRAVENOUS | Status: AC
  Administered 2017-01-06: 12:00:00 via INTRAVENOUS
  Filled 2017-01-06: qty 5

## 2017-01-06 MED ORDER — SODIUM CHLORIDE 0.9% FLUSH
10.0000 mL | INTRAVENOUS | Status: DC | PRN
  Administered 2017-01-06: 10 mL
  Filled 2017-01-06: qty 10

## 2017-01-06 MED ORDER — SODIUM CHLORIDE 0.9 % IV SOLN
600.0000 mg/m2 | Freq: Once | INTRAVENOUS | Status: AC
  Administered 2017-01-06: 1200 mg via INTRAVENOUS
  Filled 2017-01-06: qty 60

## 2017-01-06 NOTE — Patient Instructions (Signed)
May remove Neulast On-Pro at 7pm on Friday 12/11/2016.   [facility] Discharge Instructions for Patients Receiving Chemotherapy  Today you received the following chemotherapy agents Adriamycin, Cytoxan and Neulasta.  To help prevent nausea and vomiting after your treatment, we encourage you to take your nausea medication as directed.   If you develop nausea and vomiting that is not controlled by your nausea medication, call the clinic.   BELOW ARE SYMPTOMS THAT SHOULD BE REPORTED IMMEDIATELY:  *FEVER GREATER THAN 100.5 F  *CHILLS WITH OR WITHOUT FEVER  NAUSEA AND VOMITING THAT IS NOT CONTROLLED WITH YOUR NAUSEA MEDICATION  *UNUSUAL SHORTNESS OF BREATH  *UNUSUAL BRUISING OR BLEEDING  TENDERNESS IN MOUTH AND THROAT WITH OR WITHOUT PRESENCE OF ULCERS  *URINARY PROBLEMS  *BOWEL PROBLEMS  UNUSUAL RASH Items with * indicate a potential emergency and should be followed up as soon as possible.  Feel free to call the clinic you have any questions or concerns. The clinic phone number is [phone].  Please show the [place] at check-in to the Emergency Department and triage nurse.

## 2017-01-07 ENCOUNTER — Telehealth: Payer: Self-pay | Admitting: Hematology

## 2017-01-07 NOTE — Telephone Encounter (Signed)
Per 10/10 no los at check out °

## 2017-01-11 LAB — MULTIPLE MYELOMA PANEL, SERUM
ALBUMIN/GLOB SERPL: 1.2 (ref 0.7–1.7)
ALPHA 1: 0.3 g/dL (ref 0.0–0.4)
Albumin SerPl Elph-Mcnc: 3.3 g/dL (ref 2.9–4.4)
Alpha2 Glob SerPl Elph-Mcnc: 0.9 g/dL (ref 0.4–1.0)
B-GLOBULIN SERPL ELPH-MCNC: 1 g/dL (ref 0.7–1.3)
GAMMA GLOB SERPL ELPH-MCNC: 0.8 g/dL (ref 0.4–1.8)
GLOBULIN, TOTAL: 3 g/dL (ref 2.2–3.9)
IGG (IMMUNOGLOBIN G), SERUM: 799 mg/dL (ref 700–1600)
IGM (IMMUNOGLOBIN M), SRM: 123 mg/dL (ref 26–217)
IgA, Qn, Serum: 119 mg/dL (ref 87–352)
Total Protein: 6.3 g/dL (ref 6.0–8.5)

## 2017-01-14 ENCOUNTER — Ambulatory Visit (HOSPITAL_BASED_OUTPATIENT_CLINIC_OR_DEPARTMENT_OTHER): Payer: BC Managed Care – PPO

## 2017-01-14 ENCOUNTER — Other Ambulatory Visit: Payer: Self-pay | Admitting: *Deleted

## 2017-01-14 ENCOUNTER — Telehealth: Payer: Self-pay | Admitting: *Deleted

## 2017-01-14 ENCOUNTER — Telehealth: Payer: Self-pay | Admitting: Hematology

## 2017-01-14 ENCOUNTER — Ambulatory Visit (HOSPITAL_BASED_OUTPATIENT_CLINIC_OR_DEPARTMENT_OTHER): Payer: BC Managed Care – PPO | Admitting: Medical

## 2017-01-14 VITALS — BP 108/60 | HR 87 | Temp 98.5°F | Resp 16 | Ht 63.0 in

## 2017-01-14 DIAGNOSIS — D709 Neutropenia, unspecified: Secondary | ICD-10-CM

## 2017-01-14 DIAGNOSIS — A6009 Herpesviral infection of other urogenital tract: Secondary | ICD-10-CM | POA: Diagnosis not present

## 2017-01-14 DIAGNOSIS — C50212 Malignant neoplasm of upper-inner quadrant of left female breast: Secondary | ICD-10-CM

## 2017-01-14 DIAGNOSIS — Z170 Estrogen receptor positive status [ER+]: Secondary | ICD-10-CM | POA: Diagnosis not present

## 2017-01-14 DIAGNOSIS — E860 Dehydration: Secondary | ICD-10-CM | POA: Diagnosis not present

## 2017-01-14 LAB — CBC WITH DIFFERENTIAL/PLATELET
BASO%: 12.9 % — ABNORMAL HIGH (ref 0.0–2.0)
BASOS ABS: 0 10*3/uL (ref 0.0–0.1)
EOS ABS: 0 10*3/uL (ref 0.0–0.5)
EOS%: 3.2 % (ref 0.0–7.0)
HCT: 28.3 % — ABNORMAL LOW (ref 34.8–46.6)
HEMOGLOBIN: 9.5 g/dL — AB (ref 11.6–15.9)
LYMPH#: 0.2 10*3/uL — AB (ref 0.9–3.3)
LYMPH%: 61.3 % — ABNORMAL HIGH (ref 14.0–49.7)
MCH: 27.9 pg (ref 25.1–34.0)
MCHC: 33.6 g/dL (ref 31.5–36.0)
MCV: 83.2 fL (ref 79.5–101.0)
MONO#: 0 10*3/uL — ABNORMAL LOW (ref 0.1–0.9)
MONO%: 9.7 % (ref 0.0–14.0)
NEUT#: 0 10*3/uL — CL (ref 1.5–6.5)
NEUT%: 12.9 % — ABNORMAL LOW (ref 38.4–76.8)
NRBC: 0 % (ref 0–0)
PLATELETS: 96 10*3/uL — AB (ref 145–400)
RBC: 3.4 10*6/uL — AB (ref 3.70–5.45)
RDW: 15.8 % — AB (ref 11.2–14.5)
WBC: 0.3 10*3/uL — CL (ref 3.9–10.3)

## 2017-01-14 LAB — COMPREHENSIVE METABOLIC PANEL
ALT: 15 U/L (ref 0–55)
ANION GAP: 10 meq/L (ref 3–11)
AST: 14 U/L (ref 5–34)
Albumin: 3.2 g/dL — ABNORMAL LOW (ref 3.5–5.0)
Alkaline Phosphatase: 90 U/L (ref 40–150)
BUN: 7.4 mg/dL (ref 7.0–26.0)
CO2: 25 meq/L (ref 22–29)
Calcium: 9.2 mg/dL (ref 8.4–10.4)
Chloride: 100 mEq/L (ref 98–109)
Creatinine: 0.7 mg/dL (ref 0.6–1.1)
GLUCOSE: 147 mg/dL — AB (ref 70–140)
Potassium: 3.8 mEq/L (ref 3.5–5.1)
Sodium: 135 mEq/L — ABNORMAL LOW (ref 136–145)
TOTAL PROTEIN: 6.9 g/dL (ref 6.4–8.3)
Total Bilirubin: 0.51 mg/dL (ref 0.20–1.20)

## 2017-01-14 MED ORDER — SULFAMETHOXAZOLE-TRIMETHOPRIM 800-160 MG PO TABS: 1.0000 | ORAL_TABLET | Freq: Two times a day (BID) | ORAL | 0 refills | Status: AC

## 2017-01-14 MED ORDER — SODIUM CHLORIDE 0.9% FLUSH
10.0000 mL | Freq: Once | INTRAVENOUS | Status: AC
  Administered 2017-01-14: 10 mL via INTRAVENOUS
  Filled 2017-01-14: qty 10

## 2017-01-14 MED ORDER — SULFAMETHOXAZOLE-TRIMETHOPRIM 800-160 MG PO TABS: 1.0000 | ORAL_TABLET | Freq: Two times a day (BID) | ORAL | 0 refills | Status: DC

## 2017-01-14 MED ORDER — HEPARIN SOD (PORK) LOCK FLUSH 100 UNIT/ML IV SOLN
500.0000 [IU] | Freq: Once | INTRAVENOUS | Status: AC
  Administered 2017-01-14: 500 [IU] via INTRAVENOUS
  Filled 2017-01-14: qty 5

## 2017-01-14 MED ORDER — VALACYCLOVIR HCL 1 G PO TABS: 1000.0000 mg | ORAL_TABLET | Freq: Three times a day (TID) | ORAL | 2 refills | Status: AC

## 2017-01-14 MED ORDER — SODIUM CHLORIDE 0.9 % IV SOLN
Freq: Once | INTRAVENOUS | Status: AC
  Administered 2017-01-14: 13:00:00 via INTRAVENOUS

## 2017-01-14 MED FILL — SULFAMETHOXAZOLE-TMP DS TAB: 800-160 | 7 days supply | Qty: 14 | Fill #0

## 2017-01-14 MED FILL — valACYclovir HCL 1 GM TABS: 1 | 7 days supply | Qty: 21 | Fill #0

## 2017-01-14 NOTE — Telephone Encounter (Signed)
Scheduled appt per 10/18 sch msg. Patient is aware of the appt.

## 2017-01-14 NOTE — Progress Notes (Signed)
Pt states she feels better after IV fluids. VSS  Will be going to [facility] to pick up her prescriptions

## 2017-01-14 NOTE — Patient Instructions (Signed)
Dehydration, Adult Dehydration is a condition in which there is not enough fluid or water in the body. This happens when you lose more fluids than you take in. Important organs, such as the kidneys, brain, and heart, cannot function without a proper amount of fluids. Any loss of fluids from the body can lead to dehydration. Dehydration can range from mild to severe. This condition should be treated right away to prevent it from becoming severe. What are the causes? This condition may be caused by:  Vomiting.  Diarrhea.  Excessive sweating, such as from heat exposure or exercise.  Not drinking enough fluid, especially: ? When ill. ? While doing activity that requires a lot of energy.  Excessive urination.  Fever.  Infection.  Certain medicines, such as medicines that cause the body to lose excess fluid (diuretics).  Inability to access safe drinking water.  Reduced physical ability to get adequate water and food.  What increases the risk? This condition is more likely to develop in people:  Who have a poorly controlled long-term (chronic) illness, such as diabetes, heart disease, or kidney disease.  Who are age 65 or older.  Who are disabled.  Who live in a place with high altitude.  Who play endurance sports.  What are the signs or symptoms? Symptoms of mild dehydration may include:  Thirst.  Dry lips.  Slightly dry mouth.  Dry, warm skin.  Dizziness. Symptoms of moderate dehydration may include:  Very dry mouth.  Muscle cramps.  Dark urine. Urine may be the color of tea.  Decreased urine production.  Decreased tear production.  Heartbeat that is irregular or faster than normal (palpitations).  Headache.  Light-headedness, especially when you stand up from a sitting position.  Fainting (syncope). Symptoms of severe dehydration may include:  Changes in skin, such as: ? Cold and clammy skin. ? Blotchy (mottled) or pale skin. ? Skin that does  not quickly return to normal after being lightly pinched and released (poor skin turgor).  Changes in body fluids, such as: ? Extreme thirst. ? No tear production. ? Inability to sweat when body temperature is high, such as in hot weather. ? Very little urine production.  Changes in vital signs, such as: ? Weak pulse. ? Pulse that is more than 100 beats a minute when sitting still. ? Rapid breathing. ? Low blood pressure.  Other changes, such as: ? Sunken eyes. ? Cold hands and feet. ? Confusion. ? Lack of energy (lethargy). ? Difficulty waking up from sleep. ? Short-term weight loss. ? Unconsciousness. How is this diagnosed? This condition is diagnosed based on your symptoms and a physical exam. Blood and urine tests may be done to help confirm the diagnosis. How is this treated? Treatment for this condition depends on the severity. Mild or moderate dehydration can often be treated at home. Treatment should be started right away. Do not wait until dehydration becomes severe. Severe dehydration is an emergency and it needs to be treated in a hospital. Treatment for mild dehydration may include:  Drinking more fluids.  Replacing salts and minerals in your blood (electrolytes) that you may have lost. Treatment for moderate dehydration may include:  Drinking an oral rehydration solution (ORS). This is a drink that helps you replace fluids and electrolytes (rehydrate). It can be found at pharmacies and retail stores. Treatment for severe dehydration may include:  Receiving fluids through an IV tube.  Receiving an electrolyte solution through a feeding tube that is passed through your nose   and into your stomach (nasogastric tube, or NG tube).  Correcting any abnormalities in electrolytes.  Treating the underlying cause of dehydration. Follow these instructions at home:  If directed by your health care provider, drink an ORS: ? Make an ORS by following instructions on the  package. ? Start by drinking small amounts, about  cup (120 mL) every 5-10 minutes. ? Slowly increase how much you drink until you have taken the amount recommended by your health care provider.  Drink enough clear fluid to keep your urine clear or pale yellow. If you were told to drink an ORS, finish the ORS first, then start slowly drinking other clear fluids. Drink fluids such as: ? Water. Do not drink only water. Doing that can lead to having too little salt (sodium) in the body (hyponatremia). ? Ice chips. ? Fruit juice that you have added water to (diluted fruit juice). ? Low-calorie sports drinks.  Avoid: ? Alcohol. ? Drinks that contain a lot of sugar. These include high-calorie sports drinks, fruit juice that is not diluted, and soda. ? Caffeine. ? Foods that are greasy or contain a lot of fat or sugar.  Take over-the-counter and prescription medicines only as told by your health care provider.  Do not take sodium tablets. This can lead to having too much sodium in the body (hypernatremia).  Eat foods that contain a healthy balance of electrolytes, such as bananas, oranges, potatoes, tomatoes, and spinach.  Keep all follow-up visits as told by your health care provider. This is important. Contact a health care provider if:  You have abdominal pain that: ? Gets worse. ? Stays in one area (localizes).  You have a rash.  You have a stiff neck.  You are more irritable than usual.  You are sleepier or more difficult to wake up than usual.  You feel weak or dizzy.  You feel very thirsty.  You have urinated only a small amount of very dark urine over 6-8 hours. Get help right away if:  You have symptoms of severe dehydration.  You cannot drink fluids without vomiting.  Your symptoms get worse with treatment.  You have a fever.  You have a severe headache.  You have vomiting or diarrhea that: ? Gets worse. ? Does not go away.  You have blood or green matter  (bile) in your vomit.  You have blood in your stool. This may cause stool to look black and tarry.  You have not urinated in 6-8 hours.  You faint.  Your heart rate while sitting still is over 100 beats a minute.  You have trouble breathing. This information is not intended to replace advice given to you by your health care provider. Make sure you discuss any questions you have with your health care provider. Document Released: 03/16/2005 Document Revised: 10/11/2015 Document Reviewed: 05/10/2015 Elsevier Interactive Patient Education  2018 [employer]vier Inc[employer]  

## 2017-01-14 NOTE — Telephone Encounter (Signed)
Received  vm call from pt this am stating that she needs to be seen today since she has no energy & she thinks she may have a yeast infection.  She reports that her vaginal area is swollen. She is concerned that her WBC may be low.  Returned call & asked pt to come in to see symptom management.  She lives @ 15 min away & states she has to get a bath & can be here by 9:30/9:45 am.  Message to scheduler to add for lab & see Symptom Management @ 10 am

## 2017-01-15 NOTE — Progress Notes (Signed)
Symptoms Management Clinic Progress Note   SABEL HORNBECK [id number] [birth year] 61 y.o.  Gregery Na Vandrunen is managed by Dr. Truitt Merle  Actively treated with chemotherapy: yes  Current Therapy: Adriamycin and Cytoxan   Last Treated: 10 / 10 / 2018  Assessment: Plan:    Dehydration - Plan: 0.9 %  sodium chloride infusion, sodium chloride flush (NS) 0.9 % injection 10 mL, heparin lock flush 100 unit/mL  Neutropenia, unspecified type (HCC) - Plan: sulfamethoxazole-trimethoprim (BACTRIM DS,SEPTRA DS) 800-160 MG tablet, DISCONTINUED: sulfamethoxazole-trimethoprim (BACTRIM DS,SEPTRA DS) 800-160 MG tablet  Herpes genitalis in women - Plan: valACYclovir (VALTREX) 1000 MG tablet   Dehydration with orthostasis: the patient was infused with 1 L of normal saline.  Neutropenia: Given the patient's open genital sores and genital pain she was given a prescription for Bactrim DS 1 by mouth twice a day 7 days.  Herpes genitalis: Valtrex 1000 mg by mouth 3 times a day 7 days  Please see After Visit Summary for patient specific instructions.  Future Appointments Date Time Provider [place]  01/20/2017 11:30 AM [facility] LAB 5 [facility] None  01/20/2017 12:00 PM [facility] J32 DNS [facility] None  01/20/2017 12:30 PM Truitt Merle, MD [facility] None  01/20/2017 1:30 PM [facility] I26 DNS [facility] None  01/27/2017 10:30 AM [facility] LAB 6 [facility] None  01/27/2017 11:00 AM [facility] FLUSH NURSE 2 [facility] None  01/27/2017 12:15 PM [facility] F21 [facility] None  02/03/2017 12:30 PM [facility] ONLY [facility] None  02/03/2017 1:00 PM [facility] FLUSH NURSE 2 [facility] None  02/03/2017 1:30 PM Truitt Merle, MD [facility] None  02/03/2017 2:30 PM [facility] H30 [facility] None  02/10/2017 9:30 AM [facility] LAB 6 [facility] None  02/10/2017 10:00 AM [facility] FLUSH NURSE 2 [facility] None  02/10/2017 11:00 AM [facility] C8 [facility] None    No orders of  the defined types were placed in this encounter.      Subjective:   Patient ID:  FARRYN LINARES is a 61 y.o. (DOB 1955-10-17) female.  Chief Complaint:  Chief Complaint  Patient presents with  . 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 She has been treated with Adriamycin and Cytoxan with cycle 4 dosed on 01/06/2017. The patient contacted our office on the morning of 01/14/2017 stating that she had little energy and was having swelling in the vaginal area. She recently contacted her gynecologist to report that she thought that she had a yeast infection. She was given Diflucan 150 mg by mouth 1. She has a history of genital herpes and is concerned that she could be having an outbreak. She took one dose of Valtrex on Tuesday and stated that the pain that she was having in her labia was worsened. She has not taken any additional doses. She reports fatigue, mild chills, loose stools, anorexia secondary to the change in the taste of foods, and continued vaginal itching. She used Monistat for 3 days after taking the dose of Diflucan prescribed by her physician. Despite this, she continues to have vaginal discomfort.  Medications: I have reviewed the patient's current medications.  Allergies:  Allergies  Allergen Reactions  . Vicodin [Hydrocodone-Acetaminophen] Swelling and Other (See Comments)    SWELLING REACTION UNSPECIFIED     Past Medical History:  Diagnosis  Date  . Anxiety   . Arthritis   . Cancer ([place]) 10/08/2016   left breast cancer  . Diabetes mellitus without complication ([place])   . Dyslipidemia   . Family history of breast cancer   . Family history of ovarian cancer   . Family history of prostate cancer   . GERD (gastroesophageal reflux disease)      nexium if needed  . Heart murmur   . Hypertension   . Hypothyroid   . Osteopenia   . Pneumonia 10/08/2016   June 2013    Past Surgical History:  Procedure Laterality Date  . ABDOMINAL HYSTERECTOMY    . BREAST RECONSTRUCTION WITH PLACEMENT OF TISSUE EXPANDER AND FLEX HD (ACELLULAR HYDRATED DERMIS) Left 10/13/2016   Procedure: LEFT BREAST RECONSTRUCTION WITH PLACEMENT OF TISSUE EXPANDER AND ALLODERM;  Surgeon: Irene Limbo, MD;  Location: [facility];  Service: Plastics;  Laterality: Left;  . FOOT SURGERY     bi-lat/ repaired hammer toes  . MASTECTOMY W/ SENTINEL NODE BIOPSY Left 10/13/2016  . MASTECTOMY W/ SENTINEL NODE BIOPSY Left 10/13/2016   Procedure: LEFT TOTAL MASTECTOMY WITH LEFT AXILLARY SENTINEL LYMPH NODE BIOPSY;  Surgeon: Coralie Keens, MD;  Location: [facility];  Service: General;  Laterality: Left;  . PORTACATH PLACEMENT Right 11/24/2016   Procedure: INSERTION PORT-A-CATH;  Surgeon: Fanny Skates, MD;  Location: [facility];  Service: General;  Laterality: Right;  . TUBAL LIGATION    . VIDEO ASSISTED THORACOSCOPY (VATS)/WEDGE RESECTION Left 02/25/2015   Procedure: VIDEO ASSISTED THORACOSCOPY (VATS)/ LUL WEDGE RESECTION with ON Q placement.;  Surgeon: Melrose Nakayama, MD;  Location: [facility];  Service: Thoracic;  Laterality: Left;    Family History  Problem Relation Age of Onset  . Cancer Mother        brain, ovarian  . Hypertension Mother   . Stroke Father   . Hypertension Father   . Cancer Sister 14       bilateral breast cancer, 2nd cancer at 15  . Cancer Brother        dx with prostate cancer in the 67s  . Cancer Brother        dx in his 38s with prostate cancer  . Brain cancer Maternal Uncle   . Cirrhosis Brother   . Cancer Brother        NOS  . Cancer Paternal Aunt        NOS  . Cancer Cousin        paternal cousin with cancer NOS    Social History   Social History  . Marital status: Single    Spouse name: N/A  . Number of  children: 2  . Years of education: N/A   Occupational History  . Not on file.   Social History Main Topics  . Smoking status: Former Smoker    Packs/day: 1.00    Years: 42.00    Quit date: 08/27/2016  . Smokeless tobacco: Never Used     Comment: 5/1/18she smokes occasionally when stressed. She is smoking 4 cigarettes a week  . Alcohol use No  . Drug use: No  . Sexual activity: Not on file   Other Topics Concern  . Not on file   Social History Narrative  . No narrative on file    Past Medical History, Surgical history, Social history, and Family history were reviewed and updated as appropriate.   Please see review of systems for further details on the patient's review from today.  Review of Systems:  Review of Systems  Constitutional: Positive for appetite change, chills and fatigue. Negative for diaphoresis and fever.  Respiratory: Negative for cough and shortness of breath.   Cardiovascular: Negative for chest pain, palpitations and leg swelling.  Gastrointestinal: Negative for constipation, diarrhea, nausea and vomiting.  Genitourinary: Positive for genital sores and vaginal pain. Negative for difficulty urinating, dysuria and vaginal discharge.  Skin: Negative for rash.  Neurological: Negative for dizziness and headaches.    Objective:   Physical Exam:  BP (!) (P) 108/52 (BP Location: Right Arm, Patient Position: Sitting)   Pulse 87   Temp (P) 98.4 F (36.9 C) (Oral)   Resp 16   Ht '5\' 3"'  (1.6 m)   SpO2 100%  ECOG: 0  Physical Exam  HENT:  Head: Normocephalic and atraumatic.  Mouth/Throat: Oropharynx is clear and moist. No oropharyngeal exudate.  Eyes: Right eye exhibits no discharge. Left eye exhibits no discharge. No scleral icterus.  Cardiovascular: Normal rate, regular rhythm and normal heart sounds.  Exam reveals no gallop and no friction rub.   No murmur heard. Pulmonary/Chest: Effort normal and breath sounds normal. No respiratory distress. She has  no wheezes. She has no rales.  Abdominal: Soft. Bowel sounds are normal. She exhibits no distension. There is no tenderness. There is no rebound and no guarding.  Genitourinary:     Genitourinary Comments: Drucie Ip, RN was present during my examination of this patient.    Musculoskeletal: She exhibits no edema.  Neurological: She is alert. Coordination normal.  Skin: Skin is warm and dry.  Psychiatric: She has a normal mood and affect. Her behavior is normal. Judgment and thought content normal.    Lab Review:     Component Value Date/Time   NA 135 (L) 01/14/2017 1010   K 3.8 01/14/2017 1010   CL 105 10/08/2016 1315   CO2 25 01/14/2017 1010   GLUCOSE 147 (H) 01/14/2017 1010   BUN 7.4 01/14/2017 1010   CREATININE 0.7 01/14/2017 1010   CALCIUM 9.2 01/14/2017 1010   PROT 6.9 01/14/2017 1010   ALBUMIN 3.2 (L) 01/14/2017 1010   AST 14 01/14/2017 1010   ALT 15 01/14/2017 1010   ALKPHOS 90 01/14/2017 1010   BILITOT 0.51 01/14/2017 1010   GFRNONAA >60 10/08/2016 1315   GFRAA >60 10/08/2016 1315       Component Value Date/Time   WBC 0.3 (LL) 01/14/2017 1010   WBC 5.3 10/08/2016 1315   RBC 3.40 (L) 01/14/2017 1010   RBC 4.71 10/08/2016 1315   HGB 9.5 (L) 01/14/2017 1010   HCT 28.3 (L) 01/14/2017 1010   PLT 96 (L) 01/14/2017 1010   MCV 83.2 01/14/2017 1010   MCH 27.9 01/14/2017 1010   MCH 28.9 10/08/2016 1315   MCHC 33.6 01/14/2017 1010   MCHC 33.9 10/08/2016 1315   RDW 15.8 (H) 01/14/2017 1010   LYMPHSABS 0.2 (L) 01/14/2017 1010   MONOABS 0.0 (L) 01/14/2017 1010   EOSABS 0.0 01/14/2017 1010   BASOSABS 0.0 01/14/2017 1010   -------------------------------  Imaging from last 24 hours (if applicable):  Radiology interpretation: No results found.

## 2017-01-15 NOTE — Progress Notes (Signed)
[place]  [phone] [phone]  Clinic Follow-up Note   Patient Care Team: Marton Redwood, MD as PCP - General (Internal Medicine) Melrose Nakayama, MD as Consulting Physician (Cardiothoracic Surgery) Fanny Skates, MD as Consulting Physician (General Surgery) Truitt Merle, MD as Consulting Physician (Hematology) 01/20/2017   CHIEF COMPLAINTS F/U left breast cancer   Oncology History   Cancer Staging Breast cancer of upper-inner quadrant of left female breast [facility]) Staging form: Breast, AJCC 8th Edition - Clinical stage from 07/10/2016: Stage 0 (cTis (DCIS), cN0, cM0, G3, ER: Positive, PR: Positive, HER2: Not Assessed) - Signed by Truitt Merle, MD on 07/28/2016 - Pathologic stage from 10/26/2016: Stage IA (pT1a, pN1a, cM0, G2, ER: Positive, PR: Positive, HER2: Negative) - Signed by Truitt Merle, MD on 11/11/2016       Breast cancer of upper-inner quadrant of left female breast ([place])   07/10/2016 Initial Biopsy    Diagnosis 1. 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 2. 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      07/10/2016 Receptors her2    ER 95%, PR 80-90%+, both strong staining, Ki67 80-90%      07/22/2016 Initial Diagnosis    Ductal carcinoma in situ (DCIS) of left breast     07/27/2016 Imaging    Breast MRI w wo contrast showed  1. Extensive mass and non mass enhancement along the lower inner aspect the left breast, lower outer quadrant, spanning 10.2 cm from anterior to posterior, including both biopsied lesions, as detailed above. This is all consistent with DCIS. 2. No other abnormal enhancement in the left breast. 3. No abnormal or enlarged axillary lymph nodes. 4. No evidence of malignancy in the right breast      08/10/2016 Genetic Testing    ATM c.4066A>G VUS identified on the Common Hereditary Cancer panel. The Hereditary Gene Panel offered by [facility] includes sequencing and/or deletion duplication testing of the following 46 genes: APC, ATM, AXIN2, BARD1, BMPR1A, BRCA1, BRCA2, BRIP1, CDH1, CDKN2A (p14ARF), CDKN2A (p16INK4a), CHEK2, CTNNA1, DICER1, EPCAM (Deletion/duplication testing only), GREM1 (promoter region deletion/duplication testing only), KIT, MEN1, MLH1, MSH2, MSH3, MSH6, MUTYH, NBN, NF1, NHTL1, PALB2, PDGFRA, PMS2, POLD1, POLE, PTEN, RAD50, RAD51C, RAD51D, SDHB, SDHC, SDHD, SMAD4, SMARCA4. STK11, TP53, TSC1, TSC2, and VHL.  The following gene was evaluated for sequence changes only: SDHA and HOXB13 c.251G>A variant only.  The report date is Aug 09, 2016.       10/13/2016 Surgery    LEFT TOTAL MASTECTOMY WITH LEFT AXILLARY SENTINEL LYMPH NODE BIOPSY By Dr. Ninfa [facility]      10/13/2016 Pathology Results    Surgery Diagnosis 10/13/16  1. Lymph node, sentinel, biopsy, Left Axillary - METASTATIC CARCINOMA IN ONE LYMPH NODE (1/1). 2. Lymph node, sentinel, biopsy, Left - METASTATIC CARCINOMA IN ONE LYMPH NODE (1/1). 3. Breast, radical mastectomy (including lymph nodes), Left - INVASIVE DUCTAL CARCINOMA, 0.3 CM. - LYMPHOVASCULAR INVOLVEMENT BY CARCINOMA. - HIGH GRADE DUCTAL CARCINOMA IN SITU WITH CALCIFICATIONS AND NECROSIS, 5.5 CM. - DCIS FOCALLY 0.1 CM FROM CAUTERIZED MEDIAL MARGIN. - SEE ONCOLOGY TABLE AND COMMENT. 4. Skin , Left additional mastectomy flap - BENIGN FIBROADIPOSE TISSUE. - NO EVIDENCE OF MALIGNANCY.      10/30/2016 Miscellaneous    MammaPrint 10/31/26 Result:  High Risk,Luminal type B MPI: -0.368 Average 10-year risk of  recurrence untreated: 29%      11/18/2016 Imaging    CT CAP W Contrast 11/18/16 IMPRESSION: 1. Skin thickening and interstitial changes involving the left breast, likely related to radiation. Surgical changes are also noted with a tissue expander in  place. Probable postop fluid collection in the upper outer quadrant breast. No enlarged supraclavicular or axillary lymph nodes. 2. No CT findings suggesting metastatic disease involving the chest, abdomen or pelvis or bony structures. 3. Surgical changes from a left upper lobe wedge resection. No findings for recurrent tumor.      11/23/2016 Imaging    Bone Scan Whole body 11/23/16 IMPRESSION: No definite scintigraphic evidence of osseous metastatic disease as above.      11/25/2016 -  Chemotherapy    Adjuvant chemotherapy AC every 2 weeks for 4 cycles followed by Taxol weekly for 12 cycles starting 11/25/16         HISTORY OF PRESENTING ILLNESS (07/28/16):  Taylor Walsh 61 y.o. 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 She presents to the clinic today by herself. She was referred by her surgeon Dr. Dalbert Batman.   This was discovered by routine screening mammogram. She denies any palpable breast mass or adenopathy. She denies any other new symptoms. She was contacted for her need of a biopsy to further investigate the calcification found in her mammogram. A biopsy was done leading to a diagnosis of her DCIS. She reports having arthritis in the right knee. She reported having a tubal ligation and hysterectomy. She also has famaily history of breast cancer with mother and sister. She reports to live with her daughter and 2 of her grandchildren. She reports of having history of HTN, arthritis, Thyroidism, being Pre-diabetic, Osteopenia and is currently back to Smoking. She also reports her brother has a history of blood clots and he was told that it was hereditary which she is following up with Dr. Brigitte Pulse about testing.   GYN HISTORY  Menarchal: 11 LMP: 36 before hysterectomy Contraceptive: N/A HRT: no GP:G2P2    PREVIOUS THERAPY: Adjuvant chemotherapy AC every 2 weeks for 4 cycles 11/25/16-01/05/17  CURRENT THERAPY: Taxol weekly for 12 cycles starting  01/20/17   INTERVAL HISTORY:  Taylor Walsh is here for a follow up and first cycle taxol. She was last seen on 01/14/2017 by Sandi Mealy, PA-C in the symptom management clinic for dehydration. She was found to be neutropenic, ANC 0, with genital herpes outbreak, she was started on Bactrim DS and Valtrex and received 1 L IVF. She will complete Bactrim today, she has a couple more days left on Valtrex prescription. She notes her lesions are nearly resolved, but continues to have some vaginal sensitivity. She has mildly sore throat and intermittent burning in her eyes and occasional frontal/facial, no known sick contacts, nasal congestion, fever, chills, eye redness, pain, or discharge. She has mild fatigue but able to continue activities with some effort. She has good appetite but decreased taste, sometimes eating makes her gag, but no emesis. She has daily loose stool since starting chemotherapy, has not used Imodium. She felt dyspneic on exertion last week but this improved with IV hydration. Otherwise she feels well.    MEDICAL HISTORY:  Past Medical History:  Diagnosis Date   Anxiety    Arthritis    Cancer ([place]) 10/08/2016   left breast cancer   Diabetes mellitus without complication ([place])    Dyslipidemia    Family history of breast cancer  Family history of ovarian cancer    Family history of prostate cancer    GERD (gastroesophageal reflux disease)    nexium if needed   Heart murmur    Hypertension    Hypothyroid    Osteopenia    Pneumonia 10/08/2016   June 2013    SURGICAL HISTORY: Past Surgical History:  Procedure Laterality Date   ABDOMINAL HYSTERECTOMY     BREAST RECONSTRUCTION WITH PLACEMENT OF TISSUE EXPANDER AND FLEX HD (ACELLULAR HYDRATED DERMIS) Left 10/13/2016   Procedure: LEFT BREAST RECONSTRUCTION WITH PLACEMENT OF TISSUE EXPANDER AND ALLODERM;  Surgeon: Irene Limbo, MD;  Location: [facility];  Service: Plastics;  Laterality: Left;   FOOT SURGERY      bi-lat/ repaired hammer toes   MASTECTOMY W/ SENTINEL NODE BIOPSY Left 10/13/2016   MASTECTOMY W/ SENTINEL NODE BIOPSY Left 10/13/2016   Procedure: LEFT TOTAL MASTECTOMY WITH LEFT AXILLARY SENTINEL LYMPH NODE BIOPSY;  Surgeon: Coralie Keens, MD;  Location: [facility];  Service: General;  Laterality: Left;   PORTACATH PLACEMENT Right 11/24/2016   Procedure: INSERTION PORT-A-CATH;  Surgeon: Fanny Skates, MD;  Location: [facility];  Service: General;  Laterality: Right;   TUBAL LIGATION     VIDEO ASSISTED THORACOSCOPY (VATS)/WEDGE RESECTION Left 02/25/2015   Procedure: VIDEO ASSISTED THORACOSCOPY (VATS)/ LUL WEDGE RESECTION with ON Q placement.;  Surgeon: Melrose Nakayama, MD;  Location: [facility];  Service: Thoracic;  Laterality: Left;    SOCIAL HISTORY: Social History   Social History   Marital status: Single    Spouse name: N/A   Number of children: 2   Years of education: N/A   Occupational History   Not on file.   Social History Main Topics   Smoking status: Former Smoker    Packs/day: 1.00    Years: 42.00    Quit date: 08/27/2016   Smokeless tobacco: Never Used     Comment: 5/1/18she smokes occasionally when stressed. She is smoking 4 cigarettes a week   Alcohol use No   Drug use: No   Sexual activity: Not on file   Other Topics Concern   Not on file   Social History Narrative   No narrative on file    FAMILY HISTORY: Family History  Problem Relation Age of Onset   Cancer Mother        brain, ovarian   Hypertension Mother    Stroke Father    Hypertension Father    Cancer Sister 44       bilateral breast cancer, 2nd cancer at 41   Cancer Brother        dx with prostate cancer in the 72s   Cancer Brother        dx in his 44s with prostate cancer   Brain cancer Maternal Uncle    Cirrhosis Brother    Cancer Brother        NOS   Cancer Paternal Aunt        NOS   Cancer Cousin        paternal cousin with cancer  NOS    ALLERGIES:  is allergic to vicodin [hydrocodone-acetaminophen].  MEDICATIONS:  Current Outpatient Prescriptions  Medication Sig Dispense Refill   atorvastatin (LIPITOR) 40 MG tablet Take 40 mg by mouth daily.     ergocalciferol (VITAMIN D2) 50000 UNITS capsule Take 50,000 Units by mouth every Friday.      guaiFENesin-codeine 100-10 MG/5ML syrup Take 5 mLs by mouth every 6 (six) hours as needed for cough.  120 mL 0   ibuprofen (ADVIL,MOTRIN) 600 MG tablet Take 1 tablet (600 mg total) by mouth every 8 (eight) hours as needed. 15 tablet 0   levothyroxine (SYNTHROID, LEVOTHROID) 88 MCG tablet Take 88 mcg by mouth daily before breakfast.      lidocaine-prilocaine (EMLA) cream Apply to affected area once 30 g 3   metFORMIN (GLUCOPHAGE) 500 MG tablet Take 1 tablet (500 mg total) by mouth 2 (two) times daily with a meal. 60 tablet 0   methocarbamol (ROBAXIN) 500 MG tablet Take 1 tablet (500 mg total) by mouth every 8 (eight) hours as needed for muscle spasms. 30 tablet 0   olmesartan (BENICAR) 20 MG tablet Take 20 mg by mouth daily.     ondansetron (ZOFRAN) 8 MG tablet Take 1 tablet (8 mg total) by mouth 2 (two) times daily as needed. Start on the third day after chemotherapy. 30 tablet 1   prochlorperazine (COMPAZINE) 10 MG tablet Take 1 tablet (10 mg total) by mouth every 6 (six) hours as needed (Nausea or vomiting). 30 tablet 1   sulfamethoxazole-trimethoprim (BACTRIM DS,SEPTRA DS) 800-160 MG tablet Take 1 tablet by mouth 2 (two) times daily. 14 tablet 0   traMADol (ULTRAM) 50 MG tablet Take 50 mg by mouth every 6 (six) hours as needed for moderate pain or severe pain.      valACYclovir (VALTREX) 1000 MG tablet Take 1 tablet (1,000 mg total) by mouth 3 (three) times daily. 21 tablet 2   aspirin 81 MG tablet Take 81 mg by mouth daily.     calcium-vitamin D 250-100 MG-UNIT tablet Take 2 tablets by mouth daily.      HYDROcodone-acetaminophen (NORCO) 5-325 MG tablet Take 1-2  tablets by mouth every 6 (six) hours as needed for moderate pain or severe pain. (Patient not taking: Reported on 01/20/2017) 20 tablet 0   No current facility-administered medications for this visit.    Facility-Administered Medications Ordered in Other Visits  Medication Dose Route Frequency Provider Last Rate Last Dose   heparin lock flush 100 unit/mL  500 Units Intracatheter Once PRN Truitt Merle, MD       sodium chloride flush (NS) 0.9 % injection 10 mL  10 mL Intracatheter PRN Truitt Merle, MD        REVIEW OF SYSTEMS:  Constitutional: Denies fevers, chills (+) taste change (+) weight loss Eyes: Denies blurriness of vision, double vision or watery eyes. Denies eye pain, redness, or itching (+) intermittent burning and eyes Ears, nose, mouth, throat, and face: Denies mucositis or nasal congestion (+) sore throat (+) tongue is black, residual from chemo (+) intermittent frontal/facial headache Respiratory: Denies cough or wheezes (+) DOE when severely fatigued, none today Cardiovascular: Denies palpitation, chest discomfort or lower extremity swelling Gastrointestinal:  Denies vomiting, constipation, diarrhea, or change in bowel habits (+) intermittent gagging with eating (+) belching, reflux (+) loose stool, daily since beginning of chemotherapy GU/GYN: recurrent herpes outbreak, improving, some residual sensitivity Skin: Denies abnormal skin rashes Lymphatics: Denies new lymphadenopathy or easy bruising Musculoskeletal: (+) arthritis in the right knee. Denies bone pain from Neulasta Neurological:Denies numbness, tingling or new weaknesses Breast: negative  Behavioral/Psych: Mood is stable, no new changes  All other systems were reviewed with the patient and are negative.  PHYSICAL EXAMINATION: ECOG PERFORMANCE STATUS: 1  Vitals:   01/20/17 1216 01/20/17 1325  BP: 126/67   Pulse: (!) 119 100  Resp: 19   Temp: 98.1 F (36.7 C)   SpO2: 98%  Filed Weights   01/20/17 1216   Weight: 185 lb 3.2 oz (84 kg)   GENERAL:alert, no distress and comfortable SKIN: skin color, texture, turgor are normal, no rashes  EYES: normal, conjunctiva are pink and non-injected, sclera clear OROPHARYNX:no exudate, no erythema and lips, buccal mucosa, and tongue normal . No obvious mucositis NECK: supple, thyroid normal size, non-tender, without nodularity LYMPH:  no palpable  Cervical, supraclavicular, or axillary lymphadenopathy LUNGS: clear to auscultation bilaterally with normal breathing effort HEART: regular rate & rhythm and no murmurs and no lower extremity edema ABDOMEN:abdomen soft, non-tender and normal bowel sounds. No palpable hepatomegaly or masses GU/GYN: external exam reveals healing herpetic lesions to left labia minora Musculoskeletal:no cyanosis of digits and no clubbing  PSYCH: alert & oriented x 3 with fluent speech NEURO: no focal motor/sensory deficits Breasts: Breast inspection showed them to be symmetrical with no nipple discharge. (+) S/p left mastectomy with tissue expander placement with incision well-healed, no tenderness. a  LABORATORY DATA:  I have reviewed the data as listed CBC Latest Ref Rng & Units 01/20/2017 01/14/2017 01/06/2017  WBC 3.9 - 10.3 10e3/uL 8.4 0.3(LL) 11.8(H)  Hemoglobin 11.6 - 15.9 g/dL 9.7(L) 9.5(L) 10.5(L)  Hematocrit 34.8 - 46.6 % 29.4(L) 28.3(L) 31.7(L)  Platelets 145 - 400 10e3/uL 335 96(L) 298    CMP Latest Ref Rng & Units 01/20/2017 01/14/2017 01/06/2017  Glucose 70 - 140 mg/dl 121 147(H) 114  BUN 7.0 - 26.0 mg/dL 3.3(L) 7.4 4.4(L)  Creatinine 0.6 - 1.1 mg/dL 0.8 0.7 0.7  Sodium 136 - 145 mEq/L 136 135(L) 141  Potassium 3.5 - 5.1 mEq/L 4.0 3.8 3.5  Chloride 101 - 111 mmol/L - - -  CO2 22 - 29 mEq/L 21(L) 25 27  Calcium 8.4 - 10.4 mg/dL 9.2 9.2 9.0  Total Protein 6.4 - 8.3 g/dL 6.9 6.9 6.8  Total Bilirubin 0.20 - 1.20 mg/dL 0.28 0.51 0.38  Alkaline Phos 40 - 150 U/L 94 90 112  AST 5 - 34 U/L 24 14 40(H)  ALT 0 -  55 U/L 23 15 52    PATHOLOGY:   Surgery Diagnosis 10/13/16  1. Lymph node, sentinel, biopsy, Left Axillary - METASTATIC CARCINOMA IN ONE LYMPH NODE (1/1). 2. Lymph node, sentinel, biopsy, Left - METASTATIC CARCINOMA IN ONE LYMPH NODE (1/1). 3. Breast, radical mastectomy (including lymph nodes), Left - INVASIVE DUCTAL CARCINOMA, 0.3 CM. - LYMPHOVASCULAR INVOLVEMENT BY CARCINOMA. - HIGH GRADE DUCTAL CARCINOMA IN SITU WITH CALCIFICATIONS AND NECROSIS, 5.5 CM. - DCIS FOCALLY 0.1 CM FROM CAUTERIZED MEDIAL MARGIN. - SEE ONCOLOGY TABLE AND COMMENT. 4. Skin , Left additional mastectomy flap - BENIGN FIBROADIPOSE TISSUE. - NO EVIDENCE OF MALIGNANCY. Microscopic Comment 3. BREAST, INVASIVE TUMOR Procedure: Mastectomy and two sentinel lymph nodes with additional left mastectomy flap. Laterality: Left breast Tumor Size: 0.3 cm invasive carcinoma; 5.5 cm DCIS Histologic Type: Ductal Grade: II Tubular Differentiation: 2 Nuclear Pleomorphism: 2 Mitotic Count: 2 Ductal Carcinoma in Situ (DCIS): Present, high grade with necrosis Extent of Tumor: Skin: Free of tumor Nipple: Free of tumor Microscopic Comment(continued) Skeletal muscle: Free of tumor Margins: Free of tumor Invasive carcinoma, distance from closest margin: Greater than 1.5 cm DCIS, distance from closest margin: Less than 0.1 cm from medial margin Regional Lymph Nodes: Number of Lymph Nodes Examined: 2 Number of Sentinel Lymph Nodes Examined: 2 Lymph Nodes with Macrometastases: 2 Lymph Nodes with Micrometastases: 0 Lymph Nodes with Isolated Tumor Cells: 0 Breast Prognostic Profile: Pending Estrogen Receptor: Pending Progesterone Receptor: Pending Her2:  Pending Ki-67: Pending Best tumor block for sendout testing: [facility] Pathologic Stage Classification (pTNM, AJCC 8th Edition): Primary Tumor (pT): pT1a Regional Lymph Nodes (pN): pN1a Distant Metastases (pM): pMX Comments: There is extensive high grade ductal carcinoma in  situ with necrosis. Multiple additional portions of the tissue are examined and there is a 0.3 cm invasive ductal carcinoma associated with lymphovascular involvement by tumor. Breast prognostic profile will be performed on the invasive carcinoma and reported as an addendum. Immunohistochemistry shows positive basal cell staining in the DCIS with calponin, smooth muscle myosin and p63. ADDITIONAL INFORMATION: 3. By immunohistochemistry, the tumor cells are Negative for Her2 (1+). Vicente Males MD Pathologist, Electronic Signature ( Signed 10/21/2016) 3. PROGNOSTIC INDICATORS For Part 3[facility] Results: IMMUNOHISTOCHEMICAL AND MORPHOMETRIC ANALYSIS PERFORMED MANUALLY Estrogen Receptor: 95%, POSITIVE, STRONG STAINING INTENSITY Progesterone Receptor: 45%, POSITIVE, STRONG STAINING INTENSITY Proliferation Marker Ki67: 15% ADDITIONAL INFORMATION:(continued) Results: HER2 - *EQUIVOCAL*. OF NOTE, A TOTAL OF 40 TUMOR CELLS WERE EVALUATED FOR HER2 EXPRESSION. HER2 BY IMMUNOHISTOCHEMISTRY WILL BE PERFORMED AND THE RESULTS REPORTED SEPARATELY. RATIO OF HER2/CEP17 SIGNALS 1.67 AVERAGE HER2 COPY NUMBER PER CELL 4.18   Surgery: 07/10/16 Diagnosis 1. 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 - SEE MICROSCOPIC DESCRIPTION. 2. 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 - SEE MICROSCOPIC DESCRIPTION. Microscopic Comment 1. Prognostic markers have been ordered and will be reported in an addendum. 2. Prognostic markers have been ordered and will be reported in an addendum. Called to the [facility] on 07/13/16. Results: IMMUNOHISTOCHEMICAL AND MORPHOMETRIC ANALYSIS PERFORMED MANUALLY Estrogen Receptor: 95%, POSITIVE, STRONG STAINING INTENSITY Progesterone Receptor: 80%,  POSITIVE, STRONG STAINING INTENSITY  Biopsy: 02/25/15 Diagnosis 1. 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 - NO ATYPIA OR MALIGNANCY IDENTIFIED. - SEE COMMENT. 2. Lymph node, biopsy, 12 L - ONE BENIGN LYMPH NODE WITH NO TUMOR SEEN (0/1). 3. Lymph node, biopsy, 9 L - ONE BENIGN LYMPH NODE WITH NO TUMOR SEEN (0/1). Microscopic Comment 1. A Congo Red stain is performed and a crystal violet stain is performed on the lung specimen. The staining pattern (apple green birefringence) coupled with the morphology is consistent with amyloid deposition. Dr. Zenda Alpers, Dr. Fletcher Anon and Dr. Darl Householder have all seen this case in consultation with agreement that amyloid deposition is present. Dr. Donato Heinz has also seen this case in consultation with agreement of the additional findings in the case.   MammaPrint 10/31/26 Result:  High Risk at 94.6% MPI: -0.368 Average 10-year risk of recurrence untreated: 29%   Genetics 07/29/16    PROCEDURES  ECHO 11/17/16 Study Conclusions - Left ventricle: The cavity size was normal. Wall thickness was   normal. Systolic function was normal. The estimated ejection   fraction was in the range of 60% to 65%. Wall motion was normal;   there were no regional wall motion abnormalities. Features are   consistent with a pseudonormal left ventricular filling pattern,   with concomitant abnormal relaxation and increased filling   pressure (grade 2 diastolic dysfunction). - Aortic valve: There was mild regurgitation. Valve area (VTI):   1.84 cm^2. Valve area (Vmax): 1.76 cm^2. Valve area (Vmean): 1.89   cm^2.    RADIOGRAPHIC STUDIES: I have personally reviewed the radiological images as listed and  agreed with the findings in the report. No results found.  ASSESSMENT & PLAN:  Taylor Walsh is 61 y.o. who present to the clinic with Ductal carcinoma  in situ (DCIS) of left breast  1. 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 - She had mostly DCIS in her breast but There was small component of invasive cancer that has spread to 2 of her lymph nodes.  Her surgical margins were negative. -Her mammaprint showed high-risk disease, luminal type B -Her staging scan was negative for distant metastasis, or residual axillary adenopathy. --she completed adjuvant dose dense AC every 2 weeks for 4 cycles from 11/25/2016 to 01/05/2017, she tolerated well overall but had neutropenia and required IV hydration -she is due to begin weekly Taxol for 12 weeks today 01/20/2017. The course of therapy is curative.  -I again reviewed her symptoms with Taxol will likely be less than AC chemotherapy.  -She plans to do breast reconstruction after Taxol and radiation. -her [place] has recovered to 5.4, CBC and Cmet otherwise adequate for treatment. She is still slightly anemic but does not need a blood transfusion -she completed her amyloidosis urine tests, she will return to lab today after this visit -she has an active herpes outbreak that is nearly resolved, she will complete Valtrex in a couple days, I discussed the case with Dr. Burr Medico, okay to proceed with cycle 1 Taxol today; continue weekly for 12 cycles -she is mildly tachycardic today, likely some degree of dehydration; repeat heart rate 100 apically, she will receive 1 L NS over 2 hours with chemotherapy today -she has some burning in her eyes, exam is unremarkable, she can buy OTC saline eye drops, use when necessary -return in 1 week for next cycle, follow-up in 2 weeks   2.Arthritis -She experiences mild arthritis in LE, more specific to her right knee -She is pretty  active   3. Osteopenia -Last bone density scan ordered by Dr Brigitte Pulse in the last 2 years -Will f/u with PCP and scans every 2 years  4. Smoking cessation -patient stopped smoking but after close friend passing she has started again smoking.  -She smokes approximately 1 pack a week.  -The patient has quit smoking in 07/2016  5. 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 The Hereditary Gene Panel offered by [facility] includes sequencing and/or deletion duplication testing of the following 46 genes: APC, ATM, AXIN2, BARD1, BMPR1A, BRCA1, BRCA2, BRIP1, CDH1, CDKN2A (p14ARF), CDKN2A (p16INK4a), CHEK2, CTNNA1, DICER1, EPCAM (Deletion/duplication testing only), GREM1 (promoter region deletion/duplication testing only), KIT, MEN1, MLH1, MSH2, MSH3, MSH6, MUTYH, NBN, NF1, NHTL1, PALB2, PDGFRA, PMS2, POLD1, POLE, PTEN, RAD50, RAD51C, RAD51D, SDHB, SDHC, SDHD, SMAD4, SMARCA4. STK11, TP53, TSC1, TSC2, and VHL.  The following gene was evaluated for sequence changes only: SDHA and HOXB13 c.251G>A variant only.  The report date is Aug 09, 2016.  6. Dry cough, bronchitis  - she has developed some dry cough since 12/20/2016, no chest discomfort, sputum production, fever or chills. (+) Sick contact. -This is likely virus upper respiratory infection, I do not think she needs antibiotics for now. -She knows to contact us if her cough worsens or does not resolve with medication. If needed we will get a chest XRay -previously prescribed Hycodan to take at night  to help along with fluids and rest  -She denies being allergic to hydrocodone/codein  -Will hold flu shot until the resolves or improves.  -Cough improved but with post nasal drip. Will continue to hold flu shot until next visit -her cough has resolved, she continues to have mild sore throat and has active viral outbreak; continue to hold flu vaccine  7. 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 Presented to symptom management clinic on 01/14/17 for Herpes genitalis.  -She was previously given Valtrex 1000 mg by mouth 3 times a day 7 days.  -she was given prescription for Bactrim DS 1 tablet twice a day 7 days while neutropenic to prevent secondary infection from herpes outbreak  -herpetic lesions are nearly resolved, she continues to have mild vaginal sensitivity -she completes Bactrim today, she has a few days left on Valtrex and will complete this week  10.  Dysgeusia, belching, weight loss -she has trouble eating foods that taste good, she occasionally gags -I have recommended she rinse her mouth out prior to meals and brush tongue without toothpaste -she has occasional belching and reflux, she will take OTC tums when necessary -she currently drinks one ensure per day for nutrition supplementation, she will increase to 2-3 per day to prevent further weight loss  PLAN:  -mouth rinse and brush tongue prior to meals to improve taste -Tums  When necessary for belching and reflux -complete Valtrex prescription -OTC saline eye drops for eye burning when necessary -increase ensure to 2-3 per day to prevent further weight loss, may refer to dietitian if this does not improve -labs reviewed, proceed with cycle 1 Taxol, 1 L IVF over 2 hours today for mild tachycardia and dehydration -Return in one week for next cycle, follow-up in 2 weeks   All questions were answered. The patient knows to call the clinic with any problems, questions or concerns.     Alla Feeling, NP 01/20/2017    This document serves as a record of services personally performed by Truitt Merle, MD. It was created on her behalf by Joslyn Devon, a trained medical scribe. The creation of this record is based on the scribe's personal observations and the provider's statements to them. This document has been checked and approved by the attending provider.

## 2017-01-20 ENCOUNTER — Ambulatory Visit: Payer: BC Managed Care – PPO

## 2017-01-20 ENCOUNTER — Ambulatory Visit (HOSPITAL_BASED_OUTPATIENT_CLINIC_OR_DEPARTMENT_OTHER): Payer: BC Managed Care – PPO

## 2017-01-20 ENCOUNTER — Other Ambulatory Visit (HOSPITAL_BASED_OUTPATIENT_CLINIC_OR_DEPARTMENT_OTHER): Payer: BC Managed Care – PPO

## 2017-01-20 ENCOUNTER — Other Ambulatory Visit: Payer: Self-pay

## 2017-01-20 ENCOUNTER — Ambulatory Visit (HOSPITAL_BASED_OUTPATIENT_CLINIC_OR_DEPARTMENT_OTHER): Payer: BC Managed Care – PPO | Admitting: Hematology

## 2017-01-20 ENCOUNTER — Encounter: Payer: Self-pay | Admitting: Hematology

## 2017-01-20 VITALS — BP 126/67 | HR 100 | Temp 98.1°F | Resp 19 | Ht 63.0 in | Wt 185.2 lb

## 2017-01-20 VITALS — BP 114/58 | HR 84 | Temp 98.6°F | Resp 16

## 2017-01-20 DIAGNOSIS — C50212 Malignant neoplasm of upper-inner quadrant of left female breast: Secondary | ICD-10-CM | POA: Diagnosis not present

## 2017-01-20 DIAGNOSIS — D6481 Anemia due to antineoplastic chemotherapy: Secondary | ICD-10-CM | POA: Diagnosis not present

## 2017-01-20 DIAGNOSIS — Z23 Encounter for immunization: Secondary | ICD-10-CM | POA: Diagnosis not present

## 2017-01-20 DIAGNOSIS — J99 Respiratory disorders in diseases classified elsewhere: Secondary | ICD-10-CM

## 2017-01-20 DIAGNOSIS — Z5111 Encounter for antineoplastic chemotherapy: Secondary | ICD-10-CM | POA: Diagnosis not present

## 2017-01-20 DIAGNOSIS — Z170 Estrogen receptor positive status [ER+]: Principal | ICD-10-CM

## 2017-01-20 DIAGNOSIS — E039 Hypothyroidism, unspecified: Secondary | ICD-10-CM

## 2017-01-20 DIAGNOSIS — E854 Organ-limited amyloidosis: Secondary | ICD-10-CM

## 2017-01-20 DIAGNOSIS — M8580 Other specified disorders of bone density and structure, unspecified site: Secondary | ICD-10-CM

## 2017-01-20 DIAGNOSIS — I10 Essential (primary) hypertension: Secondary | ICD-10-CM

## 2017-01-20 LAB — CBC WITH DIFFERENTIAL/PLATELET
BASO%: 0.4 % (ref 0.0–2.0)
BASOS ABS: 0 10*3/uL (ref 0.0–0.1)
EOS%: 0.1 % (ref 0.0–7.0)
Eosinophils Absolute: 0 10*3/uL (ref 0.0–0.5)
HCT: 29.4 % — ABNORMAL LOW (ref 34.8–46.6)
HEMOGLOBIN: 9.7 g/dL — AB (ref 11.6–15.9)
LYMPH%: 17.9 % (ref 14.0–49.7)
MCH: 28 pg (ref 25.1–34.0)
MCHC: 33 g/dL (ref 31.5–36.0)
MCV: 85 fL (ref 79.5–101.0)
MONO#: 1.4 10*3/uL — AB (ref 0.1–0.9)
MONO%: 17 % — ABNORMAL HIGH (ref 0.0–14.0)
NEUT#: 5.4 10*3/uL (ref 1.5–6.5)
NEUT%: 64.6 % (ref 38.4–76.8)
NRBC: 0 % (ref 0–0)
Platelets: 335 10*3/uL (ref 145–400)
RBC: 3.46 10*6/uL — ABNORMAL LOW (ref 3.70–5.45)
RDW: 17.1 % — ABNORMAL HIGH (ref 11.2–14.5)
WBC: 8.4 10*3/uL (ref 3.9–10.3)
lymph#: 1.5 10*3/uL (ref 0.9–3.3)

## 2017-01-20 LAB — COMPREHENSIVE METABOLIC PANEL
ALBUMIN: 3.4 g/dL — AB (ref 3.5–5.0)
ALT: 23 U/L (ref 0–55)
ANION GAP: 12 meq/L — AB (ref 3–11)
AST: 24 U/L (ref 5–34)
Alkaline Phosphatase: 94 U/L (ref 40–150)
BUN: 3.3 mg/dL — ABNORMAL LOW (ref 7.0–26.0)
CHLORIDE: 103 meq/L (ref 98–109)
CO2: 21 meq/L — AB (ref 22–29)
CREATININE: 0.8 mg/dL (ref 0.6–1.1)
Calcium: 9.2 mg/dL (ref 8.4–10.4)
Glucose: 121 mg/dl (ref 70–140)
POTASSIUM: 4 meq/L (ref 3.5–5.1)
Sodium: 136 mEq/L (ref 136–145)
TOTAL PROTEIN: 6.9 g/dL (ref 6.4–8.3)
Total Bilirubin: 0.28 mg/dL (ref 0.20–1.20)

## 2017-01-20 MED ORDER — PACLITAXEL CHEMO INJECTION 300 MG/50ML
80.0000 mg/m2 | Freq: Once | INTRAVENOUS | Status: AC
  Administered 2017-01-20: 162 mg via INTRAVENOUS
  Filled 2017-01-20: qty 27

## 2017-01-20 MED ORDER — SODIUM CHLORIDE 0.9 % IV SOLN
Freq: Once | INTRAVENOUS | Status: AC
  Administered 2017-01-20: 14:00:00 via INTRAVENOUS

## 2017-01-20 MED ORDER — DEXAMETHASONE SODIUM PHOSPHATE 10 MG/ML IJ SOLN
INTRAMUSCULAR | Status: AC
  Filled 2017-01-20: qty 1

## 2017-01-20 MED ORDER — FAMOTIDINE IN NACL 20-0.9 MG/50ML-% IV SOLN
20.0000 mg | Freq: Once | INTRAVENOUS | Status: AC
  Administered 2017-01-20: 20 mg via INTRAVENOUS

## 2017-01-20 MED ORDER — DEXAMETHASONE SODIUM PHOSPHATE 10 MG/ML IJ SOLN
10.0000 mg | Freq: Once | INTRAMUSCULAR | Status: AC
  Administered 2017-01-20: 10 mg via INTRAVENOUS

## 2017-01-20 MED ORDER — DIPHENHYDRAMINE HCL 50 MG/ML IJ SOLN
25.0000 mg | Freq: Once | INTRAMUSCULAR | Status: AC
  Administered 2017-01-20: 25 mg via INTRAVENOUS

## 2017-01-20 MED ORDER — SODIUM CHLORIDE 0.9 % IV SOLN: 10.0000 mg | Freq: Once | INTRAVENOUS | Status: DC

## 2017-01-20 MED ORDER — HEPARIN SOD (PORK) LOCK FLUSH 100 UNIT/ML IV SOLN
500.0000 [IU] | Freq: Once | INTRAVENOUS | Status: AC | PRN
  Administered 2017-01-20: 500 [IU]
  Filled 2017-01-20: qty 5

## 2017-01-20 MED ORDER — INFLUENZA VAC SPLIT QUAD 0.5 ML IM SUSY
0.5000 mL | PREFILLED_SYRINGE | Freq: Once | INTRAMUSCULAR | Status: AC
  Administered 2017-01-20: 0.5 mL via INTRAMUSCULAR
  Filled 2017-01-20: qty 0.5

## 2017-01-20 MED ORDER — SODIUM CHLORIDE 0.9% FLUSH
10.0000 mL | INTRAVENOUS | Status: DC | PRN
  Administered 2017-01-20: 10 mL
  Filled 2017-01-20: qty 10

## 2017-01-20 MED ORDER — DIPHENHYDRAMINE HCL 50 MG/ML IJ SOLN
INTRAMUSCULAR | Status: AC
  Filled 2017-01-20: qty 1

## 2017-01-20 MED ORDER — FAMOTIDINE IN NACL 20-0.9 MG/50ML-% IV SOLN
INTRAVENOUS | Status: AC
  Filled 2017-01-20: qty 50

## 2017-01-20 NOTE — Patient Instructions (Signed)
[facility] Discharge Instructions for Patients Receiving Chemotherapy  Today you received the following chemotherapy agents Taxol  To help prevent nausea and vomiting after your treatment, we encourage you to take your nausea medication as directed   If you develop nausea and vomiting that is not controlled by your nausea medication, call the clinic.   BELOW ARE SYMPTOMS THAT SHOULD BE REPORTED IMMEDIATELY:  *FEVER GREATER THAN 100.5 F  *CHILLS WITH OR WITHOUT FEVER  NAUSEA AND VOMITING THAT IS NOT CONTROLLED WITH YOUR NAUSEA MEDICATION  *UNUSUAL SHORTNESS OF BREATH  *UNUSUAL BRUISING OR BLEEDING  TENDERNESS IN MOUTH AND THROAT WITH OR WITHOUT PRESENCE OF ULCERS  *URINARY PROBLEMS  *BOWEL PROBLEMS  UNUSUAL RASH Items with * indicate a potential emergency and should be followed up as soon as possible.  Feel free to call the clinic should you have any questions or concerns. The clinic phone number is [phone].  Please show the CHEMO ALERT CARD at check-in to the Emergency Department and triage nurse.   Paclitaxel injection What is this medicine? PACLITAXEL (PAK li TAX el) is a chemotherapy drug. It targets fast dividing cells, like cancer cells, and causes these cells to die. This medicine is used to treat ovarian cancer, breast cancer, and other cancers. This medicine may be used for other purposes; ask your health care provider or pharmacist if you have questions. COMMON BRAND NAME(S): Onxol, Taxol What should I tell my health care provider before I take this medicine? They need to know if you have any of these conditions: -blood disorders -irregular heartbeat -infection (especially a virus infection such as chickenpox, cold sores, or herpes) -liver disease -previous or ongoing radiation therapy -an unusual or allergic reaction to paclitaxel, alcohol, polyoxyethylated castor oil, other chemotherapy agents, other medicines, foods, dyes, or  preservatives -pregnant or trying to get pregnant -breast-feeding How should I use this medicine? This drug is given as an infusion into a vein. It is administered in a hospital or clinic by a specially trained health care professional. Talk to your pediatrician regarding the use of this medicine in children. Special care may be needed. Overdosage: If you think you have taken too much of this medicine contact a poison control center or emergency room at once. NOTE: This medicine is only for you. Do not share this medicine with others. What if I miss a dose? It is important not to miss your dose. Call your doctor or health care professional if you are unable to keep an appointment. What may interact with this medicine? Do not take this medicine with any of the following medications: -disulfiram -metronidazole This medicine may also interact with the following medications: -cyclosporine -diazepam -ketoconazole -medicines to increase blood counts like filgrastim, pegfilgrastim, sargramostim -other chemotherapy drugs like cisplatin, doxorubicin, epirubicin, etoposide, teniposide, vincristine -quinidine -testosterone -vaccines -verapamil Talk to your doctor or health care professional before taking any of these medicines: -acetaminophen -aspirin -ibuprofen -ketoprofen -naproxen This list may not describe all possible interactions. Give your health care provider a list of all the medicines, herbs, non-prescription drugs, or dietary supplements you use. Also tell them if you smoke, drink alcohol, or use illegal drugs. Some items may interact with your medicine. What should I watch for while using this medicine? Your condition will be monitored carefully while you are receiving this medicine. You will need important blood work done while you are taking this medicine. This medicine can cause serious allergic reactions. To reduce your risk you will need to   take other medicine(s) before  treatment with this medicine. If you experience allergic reactions like skin rash, itching or hives, swelling of the face, lips, or tongue, tell your doctor or health care professional right away. In some cases, you may be given additional medicines to help with side effects. Follow all directions for their use. This drug may make you feel generally unwell. This is not uncommon, as chemotherapy can affect healthy cells as well as cancer cells. Report any side effects. Continue your course of treatment even though you feel ill unless your doctor tells you to stop. Call your doctor or health care professional for advice if you get a fever, chills or sore throat, or other symptoms of a cold or flu. Do not treat yourself. This drug decreases your body's ability to fight infections. Try to avoid being around people who are sick. This medicine may increase your risk to bruise or bleed. Call your doctor or health care professional if you notice any unusual bleeding. Be careful brushing and flossing your teeth or using a toothpick because you may get an infection or bleed more easily. If you have any dental work done, tell your dentist you are receiving this medicine. Avoid taking products that contain aspirin, acetaminophen, ibuprofen, naproxen, or ketoprofen unless instructed by your doctor. These medicines may hide a fever. Do not become pregnant while taking this medicine. Women should inform their doctor if they wish to become pregnant or think they might be pregnant. There is a potential for serious side effects to an unborn child. Talk to your health care professional or pharmacist for more information. Do not breast-feed an infant while taking this medicine. Men are advised not to father a child while receiving this medicine. This product may contain alcohol. Ask your pharmacist or healthcare provider if this medicine contains alcohol. Be sure to tell all healthcare providers you are taking this medicine.  Certain medicines, like metronidazole and disulfiram, can cause an unpleasant reaction when taken with alcohol. The reaction includes flushing, headache, nausea, vomiting, sweating, and increased thirst. The reaction can last from 30 minutes to several hours. What side effects may I notice from receiving this medicine? Side effects that you should report to your doctor or health care professional as soon as possible: -allergic reactions like skin rash, itching or hives, swelling of the face, lips, or tongue -low blood counts - This drug may decrease the number of white blood cells, red blood cells and platelets. You may be at increased risk for infections and bleeding. -signs of infection - fever or chills, cough, sore throat, pain or difficulty passing urine -signs of decreased platelets or bleeding - bruising, pinpoint red spots on the skin, black, tarry stools, nosebleeds -signs of decreased red blood cells - unusually weak or tired, fainting spells, lightheadedness -breathing problems -chest pain -high or low blood pressure -mouth sores -nausea and vomiting -pain, swelling, redness or irritation at the injection site -pain, tingling, numbness in the hands or feet -slow or irregular heartbeat -swelling of the ankle, feet, hands Side effects that usually do not require medical attention (report to your doctor or health care professional if they continue or are bothersome): -bone pain -complete hair loss including hair on your head, underarms, pubic hair, eyebrows, and eyelashes -changes in the color of fingernails -diarrhea -loosening of the fingernails -loss of appetite -muscle or joint pain -red flush to skin -sweating This list may not describe all possible side effects. Call your doctor for medical advice about   side effects. You may report side effects to FDA at 1-800-FDA-1088. Where should I keep my medicine? This drug is given in a hospital or clinic and will not be stored at  home. NOTE: This sheet is a summary. It may not cover all possible information. If you have questions about this medicine, talk to your doctor, pharmacist, or health care provider.  2018 [employer]vier/Gold Standard (2015-01-15 19:58:00)   

## 2017-01-20 NOTE — Patient Instructions (Signed)
Implanted Port Home Guide An implanted port is a type of central line that is placed under the skin. Central lines are used to provide IV access when treatment or nutrition needs to be given through a person's veins. Implanted ports are used for long-term IV access. An implanted port may be placed because:  You need IV medicine that would be irritating to the small veins in your hands or arms.  You need long-term IV medicines, such as antibiotics.  You need IV nutrition for a long period.  You need frequent blood draws for lab tests.  You need dialysis.  Implanted ports are usually placed in the chest area, but they can also be placed in the upper arm, the abdomen, or the leg. An implanted port has two main parts:  Reservoir. The reservoir is round and will appear as a small, raised area under your skin. The reservoir is the part where a needle is inserted to give medicines or draw blood.  Catheter. The catheter is a thin, flexible tube that extends from the reservoir. The catheter is placed into a large vein. Medicine that is inserted into the reservoir goes into the catheter and then into the vein.  How will I care for my incision site? Do not get the incision site wet. Bathe or shower as directed by your health care provider. How is my port accessed? Special steps must be taken to access the port:  Before the port is accessed, a numbing cream can be placed on the skin. This helps numb the skin over the port site.  Your health care provider uses a sterile technique to access the port. ? Your health care provider must put on a mask and sterile gloves. ? The skin over your port is cleaned carefully with an antiseptic and allowed to dry. ? The port is gently pinched between sterile gloves, and a needle is inserted into the port.  Only "non-coring" port needles should be used to access the port. Once the port is accessed, a blood return should be checked. This helps ensure that the port  is in the vein and is not clogged.  If your port needs to remain accessed for a constant infusion, a clear (transparent) bandage will be placed over the needle site. The bandage and needle will need to be changed every week, or as directed by your health care provider.  Keep the bandage covering the needle clean and dry. Do not get it wet. Follow your health care provider's instructions on how to take a shower or bath while the port is accessed.  If your port does not need to stay accessed, no bandage is needed over the port.  What is flushing? Flushing helps keep the port from getting clogged. Follow your health care provider's instructions on how and when to flush the port. Ports are usually flushed with saline solution or a medicine called heparin. The need for flushing will depend on how the port is used.  If the port is used for intermittent medicines or blood draws, the port will need to be flushed: ? After medicines have been given. ? After blood has been drawn. ? As part of routine maintenance.  If a constant infusion is running, the port may not need to be flushed.  How long will my port stay implanted? The port can stay in for as long as your health care provider thinks it is needed. When it is time for the port to come out, surgery will be   done to remove it. The procedure is similar to the one performed when the port was put in. When should I seek immediate medical care? When you have an implanted port, you should seek immediate medical care if:  You notice a bad smell coming from the incision site.  You have swelling, redness, or drainage at the incision site.  You have more swelling or pain at the port site or the surrounding area.  You have a fever that is not controlled with medicine.  This information is not intended to replace advice given to you by your health care provider. Make sure you discuss any questions you have with your health care provider. Document  Released: 03/16/2005 Document Revised: 08/22/2015 Document Reviewed: 11/21/2012 [employer] Interactive Patient Education  2017 [employer]  

## 2017-01-21 ENCOUNTER — Telehealth: Payer: Self-pay

## 2017-01-21 LAB — UPEP/UIFE/LIGHT CHAINS/TP, 24-HR UR
% BETA, Urine: 0 %
ALBUMIN, U: 100 %
ALPHA 1 URINE: 0 %
ALPHA-2-GLOBULIN, U: 0 %
FREE KAPPA LT CHAINS, UR: 6.32 mg/L (ref 1.35–24.19)
FREE LAMBDA LT CHAINS, UR: 0.35 mg/L (ref 0.24–6.66)
GAMMA GLOBULIN URINE: 0 %
KAPPA/LAMBDA RATIO, U: 18.06 — AB (ref 2.04–10.37)
PROTEIN,TOTAL,URINE: 4.3 mg/dL
Prot,24hr calculated: 65 mg/24 hr (ref 30–150)

## 2017-01-21 NOTE — Telephone Encounter (Signed)
Called patient back, she left a message to call her. She wants to know how to take the Imodium? Per Dr. Burr Medico only take immodium with loose BM's only.  Does Dr. Burr Medico think she should remove her acrylic nails? Per Dr. Burr Medico, she can leave acrylic nails on as long as they are not hurting or causing problems. Verbalized understanding.

## 2017-01-22 ENCOUNTER — Telehealth: Payer: Self-pay | Admitting: Hematology

## 2017-01-22 NOTE — Telephone Encounter (Signed)
Spoke with patient about appts added per 10/24 los. Patient is getting an updated schedule when she is here on 10/31.

## 2017-01-25 ENCOUNTER — Encounter: Payer: Self-pay | Admitting: Pharmacist

## 2017-01-27 ENCOUNTER — Ambulatory Visit (HOSPITAL_BASED_OUTPATIENT_CLINIC_OR_DEPARTMENT_OTHER): Payer: BC Managed Care – PPO

## 2017-01-27 ENCOUNTER — Telehealth: Payer: Self-pay

## 2017-01-27 ENCOUNTER — Other Ambulatory Visit (HOSPITAL_BASED_OUTPATIENT_CLINIC_OR_DEPARTMENT_OTHER): Payer: BC Managed Care – PPO

## 2017-01-27 ENCOUNTER — Ambulatory Visit: Payer: BC Managed Care – PPO

## 2017-01-27 VITALS — BP 104/53 | HR 70 | Temp 98.2°F | Resp 16

## 2017-01-27 DIAGNOSIS — Z5111 Encounter for antineoplastic chemotherapy: Secondary | ICD-10-CM | POA: Diagnosis not present

## 2017-01-27 DIAGNOSIS — C50212 Malignant neoplasm of upper-inner quadrant of left female breast: Secondary | ICD-10-CM

## 2017-01-27 DIAGNOSIS — Z170 Estrogen receptor positive status [ER+]: Principal | ICD-10-CM

## 2017-01-27 DIAGNOSIS — Z95828 Presence of other vascular implants and grafts: Secondary | ICD-10-CM

## 2017-01-27 LAB — CBC WITH DIFFERENTIAL/PLATELET
BASO%: 2.7 % — ABNORMAL HIGH (ref 0.0–2.0)
BASOS ABS: 0.1 10*3/uL (ref 0.0–0.1)
EOS ABS: 0 10*3/uL (ref 0.0–0.5)
EOS%: 0.2 % (ref 0.0–7.0)
HEMATOCRIT: 27.9 % — AB (ref 34.8–46.6)
HEMOGLOBIN: 9.3 g/dL — AB (ref 11.6–15.9)
LYMPH#: 0.5 10*3/uL — AB (ref 0.9–3.3)
LYMPH%: 21 % (ref 14.0–49.7)
MCH: 28.3 pg (ref 25.1–34.0)
MCHC: 33.5 g/dL (ref 31.5–36.0)
MCV: 84.5 fL (ref 79.5–101.0)
MONO#: 0.5 10*3/uL (ref 0.1–0.9)
MONO%: 19.8 % — AB (ref 0.0–14.0)
NEUT#: 1.3 10*3/uL — ABNORMAL LOW (ref 1.5–6.5)
NEUT%: 56.3 % (ref 38.4–76.8)
Platelets: 413 10*3/uL — ABNORMAL HIGH (ref 145–400)
RBC: 3.3 10*6/uL — ABNORMAL LOW (ref 3.70–5.45)
RDW: 16.7 % — ABNORMAL HIGH (ref 11.2–14.5)
WBC: 2.4 10*3/uL — ABNORMAL LOW (ref 3.9–10.3)

## 2017-01-27 LAB — COMPREHENSIVE METABOLIC PANEL
ALBUMIN: 3.4 g/dL — AB (ref 3.5–5.0)
ALK PHOS: 61 U/L (ref 40–150)
ALT: 21 U/L (ref 0–55)
AST: 22 U/L (ref 5–34)
Anion Gap: 9 mEq/L (ref 3–11)
BUN: 3.9 mg/dL — ABNORMAL LOW (ref 7.0–26.0)
CALCIUM: 9.2 mg/dL (ref 8.4–10.4)
CHLORIDE: 103 meq/L (ref 98–109)
CO2: 26 mEq/L (ref 22–29)
Creatinine: 0.6 mg/dL (ref 0.6–1.1)
Glucose: 105 mg/dl (ref 70–140)
POTASSIUM: 3.9 meq/L (ref 3.5–5.1)
Sodium: 137 mEq/L (ref 136–145)
Total Bilirubin: 0.48 mg/dL (ref 0.20–1.20)
Total Protein: 6.5 g/dL (ref 6.4–8.3)

## 2017-01-27 MED ORDER — DIPHENHYDRAMINE HCL 50 MG/ML IJ SOLN
INTRAMUSCULAR | Status: AC
  Filled 2017-01-27: qty 1

## 2017-01-27 MED ORDER — SODIUM CHLORIDE 0.9% FLUSH
10.0000 mL | INTRAVENOUS | Status: DC | PRN
  Administered 2017-01-27: 10 mL
  Filled 2017-01-27: qty 10

## 2017-01-27 MED ORDER — DEXAMETHASONE SODIUM PHOSPHATE 10 MG/ML IJ SOLN
INTRAMUSCULAR | Status: AC
  Filled 2017-01-27: qty 1

## 2017-01-27 MED ORDER — FAMOTIDINE IN NACL 20-0.9 MG/50ML-% IV SOLN
INTRAVENOUS | Status: AC
  Filled 2017-01-27: qty 50

## 2017-01-27 MED ORDER — DEXAMETHASONE SODIUM PHOSPHATE 10 MG/ML IJ SOLN
10.0000 mg | Freq: Once | INTRAMUSCULAR | Status: AC
  Administered 2017-01-27: 10 mg via INTRAVENOUS

## 2017-01-27 MED ORDER — HEPARIN SOD (PORK) LOCK FLUSH 100 UNIT/ML IV SOLN
500.0000 [IU] | Freq: Once | INTRAVENOUS | Status: AC | PRN
  Administered 2017-01-27: 500 [IU]
  Filled 2017-01-27: qty 5

## 2017-01-27 MED ORDER — DIPHENHYDRAMINE HCL 50 MG/ML IJ SOLN
25.0000 mg | Freq: Once | INTRAMUSCULAR | Status: AC
  Administered 2017-01-27: 25 mg via INTRAVENOUS

## 2017-01-27 MED ORDER — SODIUM CHLORIDE 0.9% FLUSH
10.0000 mL | Freq: Once | INTRAVENOUS | Status: AC
  Administered 2017-01-27: 10 mL
  Filled 2017-01-27: qty 10

## 2017-01-27 MED ORDER — FAMOTIDINE IN NACL 20-0.9 MG/50ML-% IV SOLN
20.0000 mg | Freq: Once | INTRAVENOUS | Status: AC
  Administered 2017-01-27: 20 mg via INTRAVENOUS

## 2017-01-27 MED ORDER — SODIUM CHLORIDE 0.9 % IV SOLN
Freq: Once | INTRAVENOUS | Status: AC
  Administered 2017-01-27: 13:00:00 via INTRAVENOUS

## 2017-01-27 MED ORDER — PACLITAXEL CHEMO INJECTION 300 MG/50ML
80.0000 mg/m2 | Freq: Once | INTRAVENOUS | Status: AC
  Administered 2017-01-27: 162 mg via INTRAVENOUS
  Filled 2017-01-27: qty 27

## 2017-01-27 NOTE — Telephone Encounter (Signed)
Printed avs and calender for upcoming appointment for November. Per 10/31 los

## 2017-01-27 NOTE — Patient Instructions (Signed)
[facility] Discharge Instructions for Patients Receiving Chemotherapy  Today you received the following chemotherapy agents: Paclitaxel (Taxol)  To help prevent nausea and vomiting after your treatment, we encourage you to take your nausea medication as directed.    If you develop nausea and vomiting that is not controlled by your nausea medication, call the clinic.   BELOW ARE SYMPTOMS THAT SHOULD BE REPORTED IMMEDIATELY:  *FEVER GREATER THAN 100.5 F  *CHILLS WITH OR WITHOUT FEVER  NAUSEA AND VOMITING THAT IS NOT CONTROLLED WITH YOUR NAUSEA MEDICATION  *UNUSUAL SHORTNESS OF BREATH  *UNUSUAL BRUISING OR BLEEDING  TENDERNESS IN MOUTH AND THROAT WITH OR WITHOUT PRESENCE OF ULCERS  *URINARY PROBLEMS  *BOWEL PROBLEMS  UNUSUAL RASH Items with * indicate a potential emergency and should be followed up as soon as possible.  Feel free to call the clinic should you have any questions or concerns. The clinic phone number is [phone].  Please show the CHEMO ALERT CARD at check-in to the Emergency Department and triage nurse.  Paclitaxel injection What is this medicine? PACLITAXEL (PAK li TAX el) is a chemotherapy drug. It targets fast dividing cells, like cancer cells, and causes these cells to die. This medicine is used to treat ovarian cancer, breast cancer, and other cancers. This medicine may be used for other purposes; ask your health care provider or pharmacist if you have questions. COMMON BRAND NAME(S): Onxol, Taxol What should I tell my health care provider before I take this medicine? They need to know if you have any of these conditions: -blood disorders -irregular heartbeat -infection (especially a virus infection such as chickenpox, cold sores, or herpes) -liver disease -previous or ongoing radiation therapy -an unusual or allergic reaction to paclitaxel, alcohol, polyoxyethylated castor oil, other chemotherapy agents, other medicines, foods, dyes,  or preservatives -pregnant or trying to get pregnant -breast-feeding How should I use this medicine? This drug is given as an infusion into a vein. It is administered in a hospital or clinic by a specially trained health care professional. Talk to your pediatrician regarding the use of this medicine in children. Special care may be needed. Overdosage: If you think you have taken too much of this medicine contact a poison control center or emergency room at once. NOTE: This medicine is only for you. Do not share this medicine with others. What if I miss a dose? It is important not to miss your dose. Call your doctor or health care professional if you are unable to keep an appointment. What may interact with this medicine? Do not take this medicine with any of the following medications: -disulfiram -metronidazole This medicine may also interact with the following medications: -cyclosporine -diazepam -ketoconazole -medicines to increase blood counts like filgrastim, pegfilgrastim, sargramostim -other chemotherapy drugs like cisplatin, doxorubicin, epirubicin, etoposide, teniposide, vincristine -quinidine -testosterone -vaccines -verapamil Talk to your doctor or health care professional before taking any of these medicines: -acetaminophen -aspirin -ibuprofen -ketoprofen -naproxen This list may not describe all possible interactions. Give your health care provider a list of all the medicines, herbs, non-prescription drugs, or dietary supplements you use. Also tell them if you smoke, drink alcohol, or use illegal drugs. Some items may interact with your medicine. What should I watch for while using this medicine? Your condition will be monitored carefully while you are receiving this medicine. You will need important blood work done while you are taking this medicine. This medicine can cause serious allergic reactions. To reduce your risk you will need   take other medicine(s) before  treatment with this medicine. If you experience allergic reactions like skin rash, itching or hives, swelling of the face, lips, or tongue, tell your doctor or health care professional right away. In some cases, you may be given additional medicines to help with side effects. Follow all directions for their use. This drug may make you feel generally unwell. This is not uncommon, as chemotherapy can affect healthy cells as well as cancer cells. Report any side effects. Continue your course of treatment even though you feel ill unless your doctor tells you to stop. Call your doctor or health care professional for advice if you get a fever, chills or sore throat, or other symptoms of a cold or flu. Do not treat yourself. This drug decreases your body's ability to fight infections. Try to avoid being around people who are sick. This medicine may increase your risk to bruise or bleed. Call your doctor or health care professional if you notice any unusual bleeding. Be careful brushing and flossing your teeth or using a toothpick because you may get an infection or bleed more easily. If you have any dental work done, tell your dentist you are receiving this medicine. Avoid taking products that contain aspirin, acetaminophen, ibuprofen, naproxen, or ketoprofen unless instructed by your doctor. These medicines may hide a fever. Do not become pregnant while taking this medicine. Women should inform their doctor if they wish to become pregnant or think they might be pregnant. There is a potential for serious side effects to an unborn child. Talk to your health care professional or pharmacist for more information. Do not breast-feed an infant while taking this medicine. Men are advised not to father a child while receiving this medicine. This product may contain alcohol. Ask your pharmacist or healthcare provider if this medicine contains alcohol. Be sure to tell all healthcare providers you are taking this medicine.  Certain medicines, like metronidazole and disulfiram, can cause an unpleasant reaction when taken with alcohol. The reaction includes flushing, headache, nausea, vomiting, sweating, and increased thirst. The reaction can last from 30 minutes to several hours. What side effects may I notice from receiving this medicine? Side effects that you should report to your doctor or health care professional as soon as possible: -allergic reactions like skin rash, itching or hives, swelling of the face, lips, or tongue -low blood counts - This drug may decrease the number of white blood cells, red blood cells and platelets. You may be at increased risk for infections and bleeding. -signs of infection - fever or chills, cough, sore throat, pain or difficulty passing urine -signs of decreased platelets or bleeding - bruising, pinpoint red spots on the skin, black, tarry stools, nosebleeds -signs of decreased red blood cells - unusually weak or tired, fainting spells, lightheadedness -breathing problems -chest pain -high or low blood pressure -mouth sores -nausea and vomiting -pain, swelling, redness or irritation at the injection site -pain, tingling, numbness in the hands or feet -slow or irregular heartbeat -swelling of the ankle, feet, hands Side effects that usually do not require medical attention (report to your doctor or health care professional if they continue or are bothersome): -bone pain -complete hair loss including hair on your head, underarms, pubic hair, eyebrows, and eyelashes -changes in the color of fingernails -diarrhea -loosening of the fingernails -loss of appetite -muscle or joint pain -red flush to skin -sweating This list may not describe all possible side effects. Call your doctor for medical advice about   side effects. You may report side effects to FDA at 1-800-FDA-1088. Where should I keep my medicine? This drug is given in a hospital or clinic and will not be stored at  home. NOTE: This sheet is a summary. It may not cover all possible information. If you have questions about this medicine, talk to your doctor, pharmacist, or health care provider.  2018 [employer]vier/Gold Standard (2015-01-15 19:58:00)

## 2017-01-27 NOTE — Progress Notes (Signed)
Dr. Burr Medico okay to tx with ANC of 1.3. Discussed neutropenic precautions with pt. Pt c/o eyes burning and being dry. Dr. Burr Medico recommended for pt to use artificial tears.

## 2017-02-01 NOTE — Progress Notes (Signed)
[facility]  [phone] [phone]  Clinic Follow-up Note   Patient Care Team: Marton Redwood, MD as PCP - General (Internal Medicine) Melrose Nakayama, MD as Consulting Physician (Cardiothoracic Surgery) Fanny Skates, MD as Consulting Physician (General Surgery) Truitt Merle, MD as Consulting Physician (Hematology) 02/03/2017   CHIEF COMPLAINTS F/U left breast cancer   Oncology History   Cancer Staging Breast cancer of upper-inner quadrant of left female breast [facility]) Staging form: Breast, AJCC 8th Edition - Clinical stage from 07/10/2016: Stage 0 (cTis (DCIS), cN0, cM0, G3, ER: Positive, PR: Positive, HER2: Not Assessed) - Signed by Truitt Merle, MD on 07/28/2016 - Pathologic stage from 10/26/2016: Stage IA (pT1a, pN1a, cM0, G2, ER: Positive, PR: Positive, HER2: Negative) - Signed by Truitt Merle, MD on 11/11/2016       Breast cancer of upper-inner quadrant of left female breast ([place])   07/10/2016 Initial Biopsy    Diagnosis 1. 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 2. 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      07/10/2016 Receptors her2    ER 95%, PR 80-90%+, both strong staining, Ki67 80-90%      07/22/2016 Initial Diagnosis    Ductal carcinoma in situ (DCIS) of left breast     07/27/2016 Imaging    Breast MRI w wo contrast showed  1. Extensive mass and non mass enhancement along the lower inner aspect the left breast, lower outer quadrant, spanning 10.2 cm from anterior to posterior, including both biopsied lesions, as detailed above. This is all consistent with DCIS. 2. No other abnormal enhancement in the left breast. 3. No abnormal or enlarged axillary lymph nodes. 4. No evidence of malignancy in the right breast      08/10/2016 Genetic Testing    ATM c.4066A>G VUS identified on the Common Hereditary Cancer panel. The Hereditary Gene Panel offered by [facility] includes sequencing and/or deletion duplication testing of the following 46 genes: APC, ATM, AXIN2, BARD1, BMPR1A, BRCA1, BRCA2, BRIP1, CDH1, CDKN2A (p14ARF), CDKN2A (p16INK4a), CHEK2, CTNNA1, DICER1, EPCAM (Deletion/duplication testing only), GREM1 (promoter region deletion/duplication testing only), KIT, MEN1, MLH1, MSH2, MSH3, MSH6, MUTYH, NBN, NF1, NHTL1, PALB2, PDGFRA, PMS2, POLD1, POLE, PTEN, RAD50, RAD51C, RAD51D, SDHB, SDHC, SDHD, SMAD4, SMARCA4. STK11, TP53, TSC1, TSC2, and VHL.  The following gene was evaluated for sequence changes only: SDHA and HOXB13 c.251G>A variant only.  The report date is Aug 09, 2016.       10/13/2016 Surgery    LEFT TOTAL MASTECTOMY WITH LEFT AXILLARY SENTINEL LYMPH NODE BIOPSY By Dr. Ninfa Linden      10/13/2016 Pathology Results    Surgery Diagnosis 10/13/16  1. Lymph node, sentinel, biopsy, Left Axillary - METASTATIC CARCINOMA IN ONE LYMPH NODE (1/1). 2. Lymph node, sentinel, biopsy, Left - METASTATIC CARCINOMA IN ONE LYMPH NODE (1/1). 3. Breast, radical mastectomy (including lymph nodes), Left - INVASIVE DUCTAL CARCINOMA, 0.3 CM. - LYMPHOVASCULAR INVOLVEMENT BY CARCINOMA. - HIGH GRADE DUCTAL CARCINOMA IN SITU WITH CALCIFICATIONS AND NECROSIS, 5.5 CM. - DCIS FOCALLY 0.1 CM FROM CAUTERIZED MEDIAL MARGIN. - SEE ONCOLOGY TABLE AND COMMENT. 4. Skin , Left additional mastectomy flap - BENIGN FIBROADIPOSE TISSUE. - NO EVIDENCE OF MALIGNANCY.      10/30/2016 Miscellaneous    MammaPrint 10/31/26 Result:  High Risk,Luminal type B MPI: -0.368 Average 10-year risk of  recurrence untreated: 29%      11/18/2016 Imaging    CT CAP W Contrast 11/18/16 IMPRESSION: 1. Skin thickening and interstitial changes involving the left breast, likely related to radiation. Surgical changes are also noted with a tissue expander in  place. Probable postop fluid collection in the upper outer quadrant breast. No enlarged supraclavicular or axillary lymph nodes. 2. No CT findings suggesting metastatic disease involving the chest, abdomen or pelvis or bony structures. 3. Surgical changes from a left upper lobe wedge resection. No findings for recurrent tumor.      11/23/2016 Imaging    Bone Scan Whole body 11/23/16 IMPRESSION: No definite scintigraphic evidence of osseous metastatic disease as above.      11/25/2016 -  Chemotherapy    Adjuvant chemotherapy AC every 2 weeks for 4 cycles, 11/25/16-01/05/17, followed by Taxol weekly for 12 cycles starting 01/20/17         HISTORY OF PRESENTING ILLNESS (07/28/16):  Taylor Walsh 61 y.o. 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 She presents to the clinic today by herself. She was referred by her surgeon Dr. Dalbert Batman.   This was discovered by routine screening mammogram. She denies any palpable breast mass or adenopathy. She denies any other new symptoms. She was contacted for her need of a biopsy to further investigate the calcification found in her mammogram. A biopsy was done leading to a diagnosis of her DCIS. She reports having arthritis in the right knee. She reported having a tubal ligation and hysterectomy. She also has family history of breast cancer with mother and sister. She reports to live with her daughter and 2 of her grandchildren. She reports of having history of HTN, arthritis, Thyroidism, being Pre-diabetic, Osteopenia and is currently back to Smoking. She also reports her brother has a history of blood clots and he was told that it was hereditary which she is following up with Dr. Brigitte Pulse about testing.   GYN HISTORY  Menarchal: 11 LMP: 36 before hysterectomy Contraceptive: BN/A HRT: no GP:G2P2    PREVIOUS THERAPY: Adjuvant chemotherapy AC every 2 weeks for 4 cycles 11/25/16-01/05/17   CURRENT THERAPY: Taxol weekly for 12 cycles  started 01/20/17   INTERVAL HISTORY:   Taylor Walsh is here for a follow up and 3rd cycle taxol. She presents to the clinic today accompanied by family member. She notes her past chemo was better than her previously chemotherapy. She notes to still being overall fatigued. She does get around and goes out. She says she may not have energy to walk from the car so she requests can she get a temporary handicap sticker.  She went to ophthalmologist previously who suggest eye drops due to the patient having tired eyes. She bought some artificial tears which helped slightly. There was no cause for concern according to her physician.  She denies neuropathy and she notes her port is sitting out further but could be related to weight loss. She notes needing to change dates for her FMLA paperwork as she would like an extension of leave from work until her last treatment in 03/2017.  -She would like a refill for her metformin and her sugar has been manageable.      MEDICAL HISTORY:  Past Medical History:  Diagnosis Date  . Anxiety   . Arthritis   . Cancer ([place]) 10/08/2016   left breast cancer  . Diabetes mellitus without complication ([place])   . Dyslipidemia   . Family history of  breast cancer   . Family history of ovarian cancer   . Family history of prostate cancer   . GERD (gastroesophageal reflux disease)    nexium if needed  . Heart murmur   . Hypertension   . Hypothyroid   . Osteopenia   . Pneumonia 10/08/2016   June 2013    SURGICAL HISTORY: Past Surgical History:  Procedure Laterality Date  . ABDOMINAL HYSTERECTOMY    . FOOT SURGERY     bi-lat/ repaired hammer toes  . MASTECTOMY W/ SENTINEL NODE BIOPSY Left 10/13/2016  . TUBAL LIGATION      SOCIAL HISTORY: Social History   Socioeconomic History  . Marital status: Single    Spouse name: Not on file  . Number of children: 2  . Years of education: Not on file  . Highest education level: Not on file  Social Needs  .  Financial resource strain: Not on file  . Food insecurity - worry: Not on file  . Food insecurity - inability: Not on file  . Transportation needs - medical: Not on file  . Transportation needs - non-medical: Not on file  Occupational History  . Not on file  Tobacco Use  . Smoking status: Former Smoker    Packs/day: 1.00    Years: 42.00    Pack years: 42.00    Last attempt to quit: 08/27/2016    Years since quitting: 0.4  . Smokeless tobacco: Never Used  . Tobacco comment: 5/1/18she smokes occasionally when stressed. She is smoking 4 cigarettes a week  Substance and Sexual Activity  . Alcohol use: No    Alcohol/week: 0.0 oz  . Drug use: No  . Sexual activity: Not on file  Other Topics Concern  . Not on file  Social History Narrative  . Not on file    FAMILY HISTORY: Family History  Problem Relation Age of Onset  . Cancer Mother        brain, ovarian  . Hypertension Mother   . Stroke Father   . Hypertension Father   . Cancer Sister 20       bilateral breast cancer, 2nd cancer at 70  . Cancer Brother        dx with prostate cancer in the 61s  . Cancer Brother        dx in his 77s with prostate cancer  . Brain cancer Maternal Uncle   . Cirrhosis Brother   . Cancer Brother        NOS  . Cancer Paternal Aunt        NOS  . Cancer Cousin        paternal cousin with cancer NOS    ALLERGIES:  is allergic to vicodin [hydrocodone-acetaminophen].  MEDICATIONS:  Current Outpatient Medications  Medication Sig Dispense Refill  . aspirin 81 MG tablet Take 81 mg by mouth daily.    Marland Kitchen atorvastatin (LIPITOR) 40 MG tablet Take 40 mg by mouth daily.    . calcium-vitamin D 250-100 MG-UNIT tablet Take 2 tablets by mouth daily.     . ergocalciferol (VITAMIN D2) 50000 UNITS capsule Take 50,000 Units by mouth every Friday.     Marland Kitchen guaiFENesin-codeine 100-10 MG/5ML syrup Take 5 mLs by mouth every 6 (six) hours as needed for cough. 120 mL 0  . HYDROcodone-acetaminophen (NORCO) 5-325 MG  tablet Take 1-2 tablets by mouth every 6 (six) hours as needed for moderate pain or severe pain. 20 tablet 0  . ibuprofen (ADVIL,MOTRIN) 600 MG tablet  Take 1 tablet (600 mg total) by mouth every 8 (eight) hours as needed. 15 tablet 0  . levothyroxine (SYNTHROID, LEVOTHROID) 88 MCG tablet Take 88 mcg by mouth daily before breakfast.     . lidocaine-prilocaine (EMLA) cream Apply to affected area once 30 g 3  . metFORMIN (GLUCOPHAGE) 500 MG tablet Take 1 tablet (500 mg total) 2 (two) times daily with a meal by mouth. 60 tablet 2  . methocarbamol (ROBAXIN) 500 MG tablet Take 1 tablet (500 mg total) by mouth every 8 (eight) hours as needed for muscle spasms. 30 tablet 0  . olmesartan (BENICAR) 20 MG tablet Take 20 mg by mouth daily.    . ondansetron (ZOFRAN) 8 MG tablet Take 1 tablet (8 mg total) by mouth 2 (two) times daily as needed. Start on the third day after chemotherapy. 30 tablet 1  . prochlorperazine (COMPAZINE) 10 MG tablet Take 1 tablet (10 mg total) by mouth every 6 (six) hours as needed (Nausea or vomiting). 30 tablet 1  . traMADol (ULTRAM) 50 MG tablet Take 50 mg by mouth every 6 (six) hours as needed for moderate pain or severe pain.      No current facility-administered medications for this visit.    Facility-Administered Medications Ordered in Other Visits  Medication Dose Route Frequency Provider Last Rate Last Dose  . sodium chloride flush (NS) 0.9 % injection 10 mL  10 mL Intracatheter PRN Truitt Merle, MD   10 mL at 02/03/17 1734    REVIEW OF SYSTEMS:  Constitutional: Denies fevers, chills (+) significant fatigue, low energy  Eyes: Denies blurriness of vision, double vision or watery eyes. Denies eye pain, redness, or itching (+) intermittent burning and eyes Ears, nose, mouth, throat, and face: Denies mucositis or nasal congestion  Respiratory: Denies cough or wheezes Cardiovascular: Denies palpitation, chest discomfort or lower extremity swelling Gastrointestinal:  Denies  vomiting, constipation, diarrhea, or change in bowel habits  GU/GYN: recurrent herpes outbreak, improving, some residual sensitivity Skin: Denies abnormal skin rashes Lymphatics: Denies new lymphadenopathy or easy bruising Musculoskeletal: (+) arthritis in the right knee. Denies bone pain from [place] Neurological:Denies numbness, tingling or new weaknesses Breast: negative  Behavioral/Psych: Mood is stable, no new changes  All other systems were reviewed with the patient and are negative.  PHYSICAL EXAMINATION: ECOG PERFORMANCE STATUS: 1  Vitals:   02/03/17 1345  BP: (!) 112/56  Pulse: 95  Resp: 18  Temp: 97.7 F (36.5 C)  SpO2: 100%   Filed Weights   02/03/17 1345  Weight: 182 lb (82.6 kg)     GENERAL:alert, no distress and comfortable SKIN: skin color, texture, turgor are normal, no rashes  EYES: normal, conjunctiva are pink and non-injected, sclera clear OROPHARYNX:no exudate, no erythema and lips, buccal mucosa, and tongue normal . No obvious mucositis NECK: supple, thyroid normal size, non-tender, without nodularity LYMPH:  no palpable  Cervical, supraclavicular, or axillary lymphadenopathy LUNGS: clear to auscultation bilaterally with normal breathing effort HEART: regular rate & rhythm and no murmurs and no lower extremity edema ABDOMEN:abdomen soft, non-tender and normal bowel sounds. No palpable hepatomegaly or masses GU/GYN: external exam reveals healing herpetic lesions to left labia minora Musculoskeletal:no cyanosis of digits and no clubbing  PSYCH: alert & oriented x 3 with fluent speech NEURO: no focal motor/sensory deficits Breasts: Breast inspection showed them to be symmetrical with no nipple discharge. (+) S/p left mastectomy with tissue expander placement with incision well-healed, no tenderness.   LABORATORY DATA:  I have reviewed the  data as listed CBC Latest Ref Rng & Units 02/03/2017 01/27/2017 01/20/2017  WBC 3.9 - 10.3 10e3/uL 2.4(L) 2.4(L) 8.4    Hemoglobin 11.6 - 15.9 g/dL 9.4(L) 9.3(L) 9.7(L)  Hematocrit 34.8 - 46.6 % 28.9(L) 27.9(L) 29.4(L)  Platelets 145 - 400 10e3/uL 270 413(H) 335    CMP Latest Ref Rng & Units 02/03/2017 01/27/2017 01/20/2017  Glucose 70 - 140 mg/dl 109 105 121  BUN 7.0 - 26.0 mg/dL 5.1(L) 3.9(L) 3.3(L)  Creatinine 0.6 - 1.1 mg/dL 0.6 0.6 0.8  Sodium 136 - 145 mEq/L 139 137 136  Potassium 3.5 - 5.1 mEq/L 4.0 3.9 4.0  Chloride 101 - 111 mmol/L - - -  CO2 22 - 29 mEq/L 25 26 21(L)  Calcium 8.4 - 10.4 mg/dL 9.1 9.2 9.2  Total Protein 6.4 - 8.3 g/dL 6.5 6.5 6.9  Total Bilirubin 0.20 - 1.20 mg/dL 0.63 0.48 0.28  Alkaline Phos 40 - 150 U/L 52 61 94  AST 5 - 34 U/L _0 ALT 0 - 55 U/L _1 PATHOLOGY:   Surgery Diagnosis 10/13/16  1. Lymph node, sentinel, biopsy, Left Axillary - METASTATIC CARCINOMA IN ONE LYMPH NODE (1/1). 2. Lymph node, sentinel, biopsy, Left - METASTATIC CARCINOMA IN ONE LYMPH NODE (1/1). 3. Breast, radical mastectomy (including lymph nodes), Left - INVASIVE DUCTAL CARCINOMA, 0.3 CM. - LYMPHOVASCULAR INVOLVEMENT BY CARCINOMA. - HIGH GRADE DUCTAL CARCINOMA IN SITU WITH CALCIFICATIONS AND NECROSIS, 5.5 CM. - DCIS FOCALLY 0.1 CM FROM CAUTERIZED MEDIAL MARGIN. - SEE ONCOLOGY TABLE AND COMMENT. 4. Skin , Left additional mastectomy flap - BENIGN FIBROADIPOSE TISSUE. - NO EVIDENCE OF MALIGNANCY. Microscopic Comment 3. BREAST, INVASIVE TUMOR Procedure: Mastectomy and two sentinel lymph nodes with additional left mastectomy flap. Laterality: Left breast Tumor Size: 0.3 cm invasive carcinoma; 5.5 cm DCIS Histologic Type: Ductal Grade: II Tubular Differentiation: 2 Nuclear Pleomorphism: 2 Mitotic Count: 2 Ductal Carcinoma in Situ (DCIS): Present, high grade with necrosis Extent of Tumor: Skin: Free of tumor Nipple: Free of tumor Microscopic Comment(continued) Skeletal muscle: Free of tumor Margins: Free of tumor Invasive carcinoma, distance from closest margin: Greater  than 1.5 cm DCIS, distance from closest margin: Less than 0.1 cm from medial margin Regional Lymph Nodes: Number of Lymph Nodes Examined: 2 Number of Sentinel Lymph Nodes Examined: 2 Lymph Nodes with Macrometastases: 2 Lymph Nodes with Micrometastases: 0 Lymph Nodes with Isolated Tumor Cells: 0 Breast Prognostic Profile: Pending Estrogen Receptor: Pending Progesterone Receptor: Pending Her2: Pending Ki-67: Pending Best tumor block for sendout testing: [facility] Pathologic Stage Classification (pTNM, AJCC 8th Edition): Primary Tumor (pT): pT1a Regional Lymph Nodes (pN): pN1a Distant Metastases (pM): pMX Comments: There is extensive high grade ductal carcinoma in situ with necrosis. Multiple additional portions of the tissue are examined and there is a 0.3 cm invasive ductal carcinoma associated with lymphovascular involvement by tumor. Breast prognostic profile will be performed on the invasive carcinoma and reported as an addendum. Immunohistochemistry shows positive basal cell staining in the DCIS with calponin, smooth muscle myosin and p63. ADDITIONAL INFORMATION: 3. By immunohistochemistry, the tumor cells are Negative for Her2 (1+). Vicente Males MD Pathologist, Electronic Signature ( Signed 10/21/2016) 3. PROGNOSTIC INDICATORS For Part [facility] Results: IMMUNOHISTOCHEMICAL AND MORPHOMETRIC ANALYSIS PERFORMED MANUALLY Estrogen Receptor: 95%, POSITIVE, STRONG STAINING INTENSITY Progesterone Receptor: 45%, POSITIVE, STRONG STAINING INTENSITY Proliferation Marker Ki67: 15% ADDITIONAL INFORMATION:(continued) Results: HER2 - *EQUIVOCAL*. OF NOTE, A TOTAL OF 40 TUMOR CELLS WERE EVALUATED FOR HER2 EXPRESSION. HER2 BY IMMUNOHISTOCHEMISTRY WILL BE  PERFORMED AND THE RESULTS REPORTED SEPARATELY. RATIO OF HER2/CEP17 SIGNALS 1.67 AVERAGE HER2 COPY NUMBER PER CELL 4.18   Surgery: 07/10/16 Diagnosis 1. Breast, left, needle core biopsy, upper inner quadrant, anterior (near nipple) - DUCTAL  CARCINOMA IN SITU, HIGH NUCLEAR GRADE WITH NECROSIS AND CALCIFICATIONS. - SEE MICROSCOPIC DESCRIPTION. 2. 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 - SEE MICROSCOPIC DESCRIPTION. Microscopic Comment 1. Prognostic markers have been ordered and will be reported in an addendum. 2. Prognostic markers have been ordered and will be reported in an addendum. Called to the [facility] on 07/13/16. Results: IMMUNOHISTOCHEMICAL AND MORPHOMETRIC ANALYSIS PERFORMED MANUALLY Estrogen Receptor: 95%, POSITIVE, STRONG STAINING INTENSITY Progesterone Receptor: 80%, POSITIVE, STRONG STAINING INTENSITY  Biopsy: 02/25/15 Diagnosis 1. 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 - NO ATYPIA OR MALIGNANCY IDENTIFIED. - SEE COMMENT. 2. Lymph node, biopsy, 12 L - ONE BENIGN LYMPH NODE WITH NO TUMOR SEEN (0/1). 3. Lymph node, biopsy, 9 L - ONE BENIGN LYMPH NODE WITH NO TUMOR SEEN (0/1). Microscopic Comment 1. A Congo Red stain is performed and a crystal violet stain is performed on the lung specimen. The staining pattern (apple green birefringence) coupled with the morphology is consistent with amyloid deposition. Dr. Zenda Alpers, Dr. Fletcher Anon and Dr. Darl Householder have all seen this case in consultation with agreement that amyloid deposition is present. Dr. Donato Heinz has also seen this case in consultation with agreement of the additional findings in the case.   MammaPrint 10/31/26 Result:  High Risk at 94.6% MPI: -0.368 Average 10-year risk of recurrence untreated: 29%   Genetics 07/29/16    PROCEDURES  ECHO 11/17/16 Study Conclusions - Left ventricle: The cavity size was normal. Wall thickness was   normal. Systolic function was normal. The estimated ejection   fraction was in  the range of 60% to 65%. Wall motion was normal;   there were no regional wall motion abnormalities. Features are   consistent with a pseudonormal left ventricular filling pattern,   with concomitant abnormal relaxation and increased filling   pressure (grade 2 diastolic dysfunction). - Aortic valve: There was mild regurgitation. Valve area (VTI):   1.84 cm^2. Valve area (Vmax): 1.76 cm^2. Valve area (Vmean): 1.89   cm^2.    RADIOGRAPHIC STUDIES: I have personally reviewed the radiological images as listed and agreed with the findings in the report. No results found.  ASSESSMENT & PLAN:  Taylor Walsh is 61 y.o. who present to the clinic with Ductal carcinoma in situ (DCIS) of left breast  1. 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 - She had mostly DCIS in her breast but There was small component of invasive cancer that has spread to 2 of her lymph nodes.  Her surgical margins were negative. -Her mammaprint showed high-risk disease, luminal type B -Her staging scan was negative for distant metastasis, or residual axillary adenopathy. -She underwent adjuvant dose dense AC every 2 weeks for 4 cycles from 11/25/2016 to 01/05/2017, she tolerated well overall but had neutropenia and required IV hydration -She started weekly Taxol for 12 weeks on 01/20/2017 and plan to complete on 04/07/16. The  course of therapy is curative.  -She plans to do breast reconstruction after Taxol and radiation -She has been tolerating Taxol well, will continue.  She has developed a mild neutropenia. -Labs reviewed, Her BUN is 5.1, Hg is  9.4, but not need for blood transfusion. ANC is 1.2 today but adequate to continue with Taxol today. If ANC drops below 1 we will give  Granix injection to boost her WBC. I suggest she be conscious about infection and stay away from big crowds.    2. Arthritis -She experiences mild arthritis in LE, more specific to her right knee -She is pretty active   3. Osteopenia  -Last bone density scan ordered by Dr Brigitte Pulse in the last 2 years -Will f/u with PCP and scans every 2 years  4. Smoking cessation -patient stopped smoking but after close friend passing she has started again smoking.  -She smokes approximately 1 pack a week.  -The patient quit smoking in 07/2016  5. 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 The Hereditary Gene Panel offered by [facility] includes sequencing and/or deletion duplication testing of the following 46 genes: APC, ATM, AXIN2, BARD1, BMPR1A, BRCA1, BRCA2, BRIP1, CDH1, CDKN2A (p14ARF), CDKN2A (p16INK4a), CHEK2, CTNNA1, DICER1, EPCAM (Deletion/duplication testing only), GREM1 (promoter region deletion/duplication testing only), KIT, MEN1, MLH1, MSH2, MSH3, MSH6, MUTYH, NBN, NF1, NHTL1, PALB2, PDGFRA, PMS2, POLD1, POLE, PTEN, RAD50, RAD51C, RAD51D, SDHB, SDHC, SDHD, SMAD4, SMARCA4. STK11, TP53, TSC1, TSC2, and VHL.  The following gene was evaluated for sequence changes only: SDHA and HOXB13 c.251G>A variant only.  The report date is Aug 09, 2016.  6. 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 History of pulmonary amyloidosis -She was found to have a lung nodule in October 2016, which was surgically removed, and showed pulmonary amyloidosis -Previous lab showed no evidence of systemic amyloidosis -Continue monitoring  8.  Neutropenia -secondary to chemotherapy -mild, will continue monitoring. -if ANCA below 1.0, will consider Granix  9.  There is induced hyperglycemia -Continue metformin,  random sugar has been normal -Previously discussed diabetic diet and exercise  PLAN:  -Extension for FMLA paperwork until mid 03/2017 -Provide handicap sticker application for patient.  -Refill Metformin today -Labs reviewed and adequate to proceed with taxol today and continue weekly until 1/9 -F/u every 2 weeks     All questions were answered. The patient knows to call the clinic with any problems, questions or concerns.     Truitt Merle, MD 02/03/2017   This document serves as a record of services personally performed by Truitt Merle, MD. It was created on her behalf by Joslyn Devon, a trained medical scribe. The creation of this record is based on the scribe's personal observations and the provider's statements to them.    I have reviewed the above documentation for accuracy and completeness, and I agree with the above.

## 2017-02-02 DIAGNOSIS — T451X5A Adverse effect of antineoplastic and immunosuppressive drugs, initial encounter: Secondary | ICD-10-CM

## 2017-02-02 DIAGNOSIS — D6481 Anemia due to antineoplastic chemotherapy: Secondary | ICD-10-CM | POA: Insufficient documentation

## 2017-02-03 ENCOUNTER — Encounter: Payer: Self-pay | Admitting: Hematology

## 2017-02-03 ENCOUNTER — Other Ambulatory Visit (HOSPITAL_BASED_OUTPATIENT_CLINIC_OR_DEPARTMENT_OTHER): Payer: BC Managed Care – PPO

## 2017-02-03 ENCOUNTER — Telehealth: Payer: Self-pay

## 2017-02-03 ENCOUNTER — Ambulatory Visit: Payer: BC Managed Care – PPO

## 2017-02-03 ENCOUNTER — Ambulatory Visit (HOSPITAL_BASED_OUTPATIENT_CLINIC_OR_DEPARTMENT_OTHER): Payer: BC Managed Care – PPO

## 2017-02-03 ENCOUNTER — Ambulatory Visit (HOSPITAL_BASED_OUTPATIENT_CLINIC_OR_DEPARTMENT_OTHER): Payer: BC Managed Care – PPO | Admitting: Hematology

## 2017-02-03 VITALS — BP 112/56 | HR 95 | Temp 97.7°F | Resp 18 | Ht 63.0 in | Wt 182.0 lb

## 2017-02-03 DIAGNOSIS — M8580 Other specified disorders of bone density and structure, unspecified site: Secondary | ICD-10-CM | POA: Diagnosis not present

## 2017-02-03 DIAGNOSIS — Z5111 Encounter for antineoplastic chemotherapy: Secondary | ICD-10-CM

## 2017-02-03 DIAGNOSIS — C773 Secondary and unspecified malignant neoplasm of axilla and upper limb lymph nodes: Secondary | ICD-10-CM

## 2017-02-03 DIAGNOSIS — D6481 Anemia due to antineoplastic chemotherapy: Secondary | ICD-10-CM

## 2017-02-03 DIAGNOSIS — D701 Agranulocytosis secondary to cancer chemotherapy: Secondary | ICD-10-CM | POA: Diagnosis not present

## 2017-02-03 DIAGNOSIS — C50212 Malignant neoplasm of upper-inner quadrant of left female breast: Secondary | ICD-10-CM

## 2017-02-03 DIAGNOSIS — Z170 Estrogen receptor positive status [ER+]: Secondary | ICD-10-CM

## 2017-02-03 DIAGNOSIS — C50412 Malignant neoplasm of upper-outer quadrant of left female breast: Secondary | ICD-10-CM

## 2017-02-03 DIAGNOSIS — Z95828 Presence of other vascular implants and grafts: Secondary | ICD-10-CM

## 2017-02-03 DIAGNOSIS — T451X5A Adverse effect of antineoplastic and immunosuppressive drugs, initial encounter: Secondary | ICD-10-CM

## 2017-02-03 LAB — CBC WITH DIFFERENTIAL/PLATELET
BASO%: 1.2 % (ref 0.0–2.0)
BASOS ABS: 0 10*3/uL (ref 0.0–0.1)
EOS ABS: 0 10*3/uL (ref 0.0–0.5)
EOS%: 0.8 % (ref 0.0–7.0)
HEMATOCRIT: 28.9 % — AB (ref 34.8–46.6)
HEMOGLOBIN: 9.4 g/dL — AB (ref 11.6–15.9)
LYMPH#: 0.8 10*3/uL — AB (ref 0.9–3.3)
LYMPH%: 33.1 % (ref 14.0–49.7)
MCH: 28.4 pg (ref 25.1–34.0)
MCHC: 32.5 g/dL (ref 31.5–36.0)
MCV: 87.3 fL (ref 79.5–101.0)
MONO#: 0.4 10*3/uL (ref 0.1–0.9)
MONO%: 15.3 % — ABNORMAL HIGH (ref 0.0–14.0)
NEUT#: 1.2 10*3/uL — ABNORMAL LOW (ref 1.5–6.5)
NEUT%: 49.6 % (ref 38.4–76.8)
PLATELETS: 270 10*3/uL (ref 145–400)
RBC: 3.31 10*6/uL — ABNORMAL LOW (ref 3.70–5.45)
RDW: 19.8 % — AB (ref 11.2–14.5)
WBC: 2.4 10*3/uL — ABNORMAL LOW (ref 3.9–10.3)

## 2017-02-03 LAB — COMPREHENSIVE METABOLIC PANEL
ALBUMIN: 3.6 g/dL (ref 3.5–5.0)
ALK PHOS: 52 U/L (ref 40–150)
ALT: 31 U/L (ref 0–55)
ANION GAP: 9 meq/L (ref 3–11)
AST: 27 U/L (ref 5–34)
BILIRUBIN TOTAL: 0.63 mg/dL (ref 0.20–1.20)
BUN: 5.1 mg/dL — AB (ref 7.0–26.0)
CALCIUM: 9.1 mg/dL (ref 8.4–10.4)
CO2: 25 mEq/L (ref 22–29)
CREATININE: 0.6 mg/dL (ref 0.6–1.1)
Chloride: 105 mEq/L (ref 98–109)
EGFR: >60 mL/min/{1.73_m2} (ref >60)
Glucose: 109 mg/dl (ref 70–140)
Potassium: 4 mEq/L (ref 3.5–5.1)
Sodium: 139 mEq/L (ref 136–145)
Total Protein: 6.5 g/dL (ref 6.4–8.3)

## 2017-02-03 MED ORDER — HEPARIN SOD (PORK) LOCK FLUSH 100 UNIT/ML IV SOLN
500.0000 [IU] | Freq: Once | INTRAVENOUS | Status: AC | PRN
  Administered 2017-02-03: 500 [IU]
  Filled 2017-02-03: qty 5

## 2017-02-03 MED ORDER — SODIUM CHLORIDE 0.9 % IV SOLN
80.0000 mg/m2 | Freq: Once | INTRAVENOUS | Status: AC
  Administered 2017-02-03: 162 mg via INTRAVENOUS
  Filled 2017-02-03: qty 27

## 2017-02-03 MED ORDER — SODIUM CHLORIDE 0.9 % IV SOLN
Freq: Once | INTRAVENOUS | Status: AC
  Administered 2017-02-03: 15:00:00 via INTRAVENOUS

## 2017-02-03 MED ORDER — FAMOTIDINE IN NACL 20-0.9 MG/50ML-% IV SOLN
INTRAVENOUS | Status: AC
  Filled 2017-02-03: qty 50

## 2017-02-03 MED ORDER — DIPHENHYDRAMINE HCL 50 MG/ML IJ SOLN
25.0000 mg | Freq: Once | INTRAMUSCULAR | Status: AC
  Administered 2017-02-03: 25 mg via INTRAVENOUS

## 2017-02-03 MED ORDER — DEXAMETHASONE SODIUM PHOSPHATE 10 MG/ML IJ SOLN
10.0000 mg | Freq: Once | INTRAMUSCULAR | Status: AC
  Administered 2017-02-03: 10 mg via INTRAVENOUS

## 2017-02-03 MED ORDER — PALONOSETRON HCL INJECTION 0.25 MG/5ML
INTRAVENOUS | Status: AC
  Filled 2017-02-03: qty 5

## 2017-02-03 MED ORDER — SODIUM CHLORIDE 0.9% FLUSH
10.0000 mL | Freq: Once | INTRAVENOUS | Status: AC
  Administered 2017-02-03: 10 mL
  Filled 2017-02-03: qty 10

## 2017-02-03 MED ORDER — DEXAMETHASONE SODIUM PHOSPHATE 10 MG/ML IJ SOLN
INTRAMUSCULAR | Status: AC
  Filled 2017-02-03: qty 1

## 2017-02-03 MED ORDER — FAMOTIDINE IN NACL 20-0.9 MG/50ML-% IV SOLN
20.0000 mg | Freq: Once | INTRAVENOUS | Status: AC
  Administered 2017-02-03: 20 mg via INTRAVENOUS

## 2017-02-03 MED ORDER — METFORMIN HCL 500 MG PO TABS: 500.0000 mg | ORAL_TABLET | Freq: Two times a day (BID) | ORAL | 2 refills | Status: DC

## 2017-02-03 MED ORDER — DIPHENHYDRAMINE HCL 50 MG/ML IJ SOLN
INTRAMUSCULAR | Status: AC
  Filled 2017-02-03: qty 1

## 2017-02-03 MED ORDER — SODIUM CHLORIDE 0.9% FLUSH
10.0000 mL | INTRAVENOUS | Status: DC | PRN
  Administered 2017-02-03: 10 mL
  Filled 2017-02-03: qty 10

## 2017-02-03 NOTE — Patient Instructions (Signed)
[facility] Discharge Instructions for Patients Receiving Chemotherapy  Today you received the following chemotherapy agents:  Taxol.  To help prevent nausea and vomiting after your treatment, we encourage you to take your nausea medication as directed.   If you develop nausea and vomiting that is not controlled by your nausea medication, call the clinic.   BELOW ARE SYMPTOMS THAT SHOULD BE REPORTED IMMEDIATELY:  *FEVER GREATER THAN 100.5 F  *CHILLS WITH OR WITHOUT FEVER  NAUSEA AND VOMITING THAT IS NOT CONTROLLED WITH YOUR NAUSEA MEDICATION  *UNUSUAL SHORTNESS OF BREATH  *UNUSUAL BRUISING OR BLEEDING  TENDERNESS IN MOUTH AND THROAT WITH OR WITHOUT PRESENCE OF ULCERS  *URINARY PROBLEMS  *BOWEL PROBLEMS  UNUSUAL RASH Items with * indicate a potential emergency and should be followed up as soon as possible.  Feel free to call the clinic should you have any questions or concerns. The clinic phone number is [phone].  Please show the CHEMO ALERT CARD at check-in to the Emergency Department and triage nurse.   

## 2017-02-03 NOTE — Progress Notes (Signed)
OK to treat with ANC 1.2 , and BUN 5.1 today as per Dr. Burr Medico.

## 2017-02-03 NOTE — Telephone Encounter (Signed)
Scheduled per 11/7 los for upcoming appointment.

## 2017-02-10 ENCOUNTER — Other Ambulatory Visit (HOSPITAL_BASED_OUTPATIENT_CLINIC_OR_DEPARTMENT_OTHER): Payer: BC Managed Care – PPO

## 2017-02-10 ENCOUNTER — Ambulatory Visit (HOSPITAL_BASED_OUTPATIENT_CLINIC_OR_DEPARTMENT_OTHER): Payer: BC Managed Care – PPO

## 2017-02-10 ENCOUNTER — Ambulatory Visit: Payer: BC Managed Care – PPO

## 2017-02-10 VITALS — BP 114/73 | HR 71 | Temp 98.2°F | Resp 17

## 2017-02-10 DIAGNOSIS — Z95828 Presence of other vascular implants and grafts: Secondary | ICD-10-CM

## 2017-02-10 DIAGNOSIS — Z170 Estrogen receptor positive status [ER+]: Secondary | ICD-10-CM

## 2017-02-10 DIAGNOSIS — C50212 Malignant neoplasm of upper-inner quadrant of left female breast: Secondary | ICD-10-CM | POA: Diagnosis not present

## 2017-02-10 DIAGNOSIS — Z5111 Encounter for antineoplastic chemotherapy: Secondary | ICD-10-CM | POA: Diagnosis not present

## 2017-02-10 LAB — COMPREHENSIVE METABOLIC PANEL
ALT: 21 U/L (ref 0–55)
AST: 19 U/L (ref 5–34)
Albumin: 3.6 g/dL (ref 3.5–5.0)
Alkaline Phosphatase: 47 U/L (ref 40–150)
Anion Gap: 7 mEq/L (ref 3–11)
BUN: 5.8 mg/dL — ABNORMAL LOW (ref 7.0–26.0)
CHLORIDE: 105 meq/L (ref 98–109)
CO2: 27 meq/L (ref 22–29)
Calcium: 9.3 mg/dL (ref 8.4–10.4)
Creatinine: 0.7 mg/dL (ref 0.6–1.1)
GLUCOSE: 104 mg/dL (ref 70–140)
POTASSIUM: 4.3 meq/L (ref 3.5–5.1)
SODIUM: 138 meq/L (ref 136–145)
Total Bilirubin: 0.55 mg/dL (ref 0.20–1.20)
Total Protein: 6.7 g/dL (ref 6.4–8.3)

## 2017-02-10 LAB — CBC WITH DIFFERENTIAL/PLATELET
BASO%: 0.9 % (ref 0.0–2.0)
BASOS ABS: 0 10*3/uL (ref 0.0–0.1)
EOS ABS: 0 10*3/uL (ref 0.0–0.5)
EOS%: 0.9 % (ref 0.0–7.0)
HEMATOCRIT: 29.6 % — AB (ref 34.8–46.6)
HEMOGLOBIN: 9.7 g/dL — AB (ref 11.6–15.9)
LYMPH#: 0.5 10*3/uL — AB (ref 0.9–3.3)
LYMPH%: 20.3 % (ref 14.0–49.7)
MCH: 29 pg (ref 25.1–34.0)
MCHC: 32.9 g/dL (ref 31.5–36.0)
MCV: 88.2 fL (ref 79.5–101.0)
MONO#: 0.3 10*3/uL (ref 0.1–0.9)
MONO%: 12.3 % (ref 0.0–14.0)
NEUT%: 65.6 % (ref 38.4–76.8)
NEUTROS ABS: 1.6 10*3/uL (ref 1.5–6.5)
Platelets: 300 10*3/uL (ref 145–400)
RBC: 3.35 10*6/uL — ABNORMAL LOW (ref 3.70–5.45)
RDW: 22.7 % — ABNORMAL HIGH (ref 11.2–14.5)
WBC: 2.5 10*3/uL — ABNORMAL LOW (ref 3.9–10.3)

## 2017-02-10 MED ORDER — DIPHENHYDRAMINE HCL 50 MG/ML IJ SOLN
25.0000 mg | Freq: Once | INTRAMUSCULAR | Status: AC
  Administered 2017-02-10: 25 mg via INTRAVENOUS

## 2017-02-10 MED ORDER — SODIUM CHLORIDE 0.9% FLUSH
10.0000 mL | Freq: Once | INTRAVENOUS | Status: AC
  Administered 2017-02-10: 10 mL
  Filled 2017-02-10: qty 10

## 2017-02-10 MED ORDER — DEXAMETHASONE SODIUM PHOSPHATE 10 MG/ML IJ SOLN
10.0000 mg | Freq: Once | INTRAMUSCULAR | Status: AC
  Administered 2017-02-10: 10 mg via INTRAVENOUS

## 2017-02-10 MED ORDER — HEPARIN SOD (PORK) LOCK FLUSH 100 UNIT/ML IV SOLN
500.0000 [IU] | Freq: Once | INTRAVENOUS | Status: AC | PRN
  Administered 2017-02-10: 500 [IU]
  Filled 2017-02-10: qty 5

## 2017-02-10 MED ORDER — DEXAMETHASONE SODIUM PHOSPHATE 10 MG/ML IJ SOLN
INTRAMUSCULAR | Status: AC
  Filled 2017-02-10: qty 1

## 2017-02-10 MED ORDER — SODIUM CHLORIDE 0.9 % IV SOLN
Freq: Once | INTRAVENOUS | Status: AC
  Administered 2017-02-10: 10:00:00 via INTRAVENOUS

## 2017-02-10 MED ORDER — SODIUM CHLORIDE 0.9% FLUSH
10.0000 mL | INTRAVENOUS | Status: DC | PRN
  Administered 2017-02-10: 10 mL
  Filled 2017-02-10: qty 10

## 2017-02-10 MED ORDER — SODIUM CHLORIDE 0.9 % IV SOLN
80.0000 mg/m2 | Freq: Once | INTRAVENOUS | Status: AC
  Administered 2017-02-10: 162 mg via INTRAVENOUS
  Filled 2017-02-10: qty 27

## 2017-02-10 MED ORDER — DIPHENHYDRAMINE HCL 50 MG/ML IJ SOLN
INTRAMUSCULAR | Status: AC
  Filled 2017-02-10: qty 1

## 2017-02-10 MED ORDER — FAMOTIDINE IN NACL 20-0.9 MG/50ML-% IV SOLN
20.0000 mg | Freq: Once | INTRAVENOUS | Status: AC
  Administered 2017-02-10: 20 mg via INTRAVENOUS

## 2017-02-10 MED ORDER — FAMOTIDINE IN NACL 20-0.9 MG/50ML-% IV SOLN
INTRAVENOUS | Status: AC
  Filled 2017-02-10: qty 50

## 2017-02-10 NOTE — Patient Instructions (Signed)
[facility] Discharge Instructions for Patients Receiving Chemotherapy  Today you received the following chemotherapy agents:  Taxol.  To help prevent nausea and vomiting after your treatment, we encourage you to take your nausea medication as directed.   If you develop nausea and vomiting that is not controlled by your nausea medication, call the clinic.   BELOW ARE SYMPTOMS THAT SHOULD BE REPORTED IMMEDIATELY:  *FEVER GREATER THAN 100.5 F  *CHILLS WITH OR WITHOUT FEVER  NAUSEA AND VOMITING THAT IS NOT CONTROLLED WITH YOUR NAUSEA MEDICATION  *UNUSUAL SHORTNESS OF BREATH  *UNUSUAL BRUISING OR BLEEDING  TENDERNESS IN MOUTH AND THROAT WITH OR WITHOUT PRESENCE OF ULCERS  *URINARY PROBLEMS  *BOWEL PROBLEMS  UNUSUAL RASH Items with * indicate a potential emergency and should be followed up as soon as possible.  Feel free to call the clinic should you have any questions or concerns. The clinic phone number is [phone].  Please show the CHEMO ALERT CARD at check-in to the Emergency Department and triage nurse.   

## 2017-02-17 ENCOUNTER — Ambulatory Visit (HOSPITAL_BASED_OUTPATIENT_CLINIC_OR_DEPARTMENT_OTHER): Payer: BC Managed Care – PPO | Admitting: Nurse Practitioner

## 2017-02-17 ENCOUNTER — Encounter: Payer: Self-pay | Admitting: Nurse Practitioner

## 2017-02-17 ENCOUNTER — Ambulatory Visit (HOSPITAL_BASED_OUTPATIENT_CLINIC_OR_DEPARTMENT_OTHER): Payer: BC Managed Care – PPO

## 2017-02-17 ENCOUNTER — Ambulatory Visit: Payer: BC Managed Care – PPO

## 2017-02-17 ENCOUNTER — Other Ambulatory Visit (HOSPITAL_BASED_OUTPATIENT_CLINIC_OR_DEPARTMENT_OTHER): Payer: BC Managed Care – PPO

## 2017-02-17 VITALS — BP 115/63 | HR 80 | Temp 98.5°F | Resp 18 | Ht 63.0 in | Wt 181.2 lb

## 2017-02-17 DIAGNOSIS — Z170 Estrogen receptor positive status [ER+]: Principal | ICD-10-CM

## 2017-02-17 DIAGNOSIS — C50212 Malignant neoplasm of upper-inner quadrant of left female breast: Secondary | ICD-10-CM

## 2017-02-17 DIAGNOSIS — D6481 Anemia due to antineoplastic chemotherapy: Secondary | ICD-10-CM

## 2017-02-17 DIAGNOSIS — M8580 Other specified disorders of bone density and structure, unspecified site: Secondary | ICD-10-CM

## 2017-02-17 DIAGNOSIS — Z5111 Encounter for antineoplastic chemotherapy: Secondary | ICD-10-CM

## 2017-02-17 DIAGNOSIS — D649 Anemia, unspecified: Secondary | ICD-10-CM

## 2017-02-17 DIAGNOSIS — T451X5A Adverse effect of antineoplastic and immunosuppressive drugs, initial encounter: Secondary | ICD-10-CM

## 2017-02-17 DIAGNOSIS — Z95828 Presence of other vascular implants and grafts: Secondary | ICD-10-CM

## 2017-02-17 LAB — CBC WITH DIFFERENTIAL/PLATELET
BASO%: 0.6 % (ref 0.0–2.0)
Basophils Absolute: 0 10*3/uL (ref 0.0–0.1)
EOS ABS: 0.1 10*3/uL (ref 0.0–0.5)
EOS%: 1.8 % (ref 0.0–7.0)
HEMATOCRIT: 29.4 % — AB (ref 34.8–46.6)
HEMOGLOBIN: 9.5 g/dL — AB (ref 11.6–15.9)
LYMPH#: 0.8 10*3/uL — AB (ref 0.9–3.3)
LYMPH%: 22.4 % (ref 14.0–49.7)
MCH: 29 pg (ref 25.1–34.0)
MCHC: 32.3 g/dL (ref 31.5–36.0)
MCV: 89.6 fL (ref 79.5–101.0)
MONO#: 0.4 10*3/uL (ref 0.1–0.9)
MONO%: 10.9 % (ref 0.0–14.0)
NEUT#: 2.2 10*3/uL (ref 1.5–6.5)
NEUT%: 64.3 % (ref 38.4–76.8)
PLATELETS: 245 10*3/uL (ref 145–400)
RBC: 3.28 10*6/uL — AB (ref 3.70–5.45)
RDW: 21.9 % — AB (ref 11.2–14.5)
WBC: 3.4 10*3/uL — AB (ref 3.9–10.3)

## 2017-02-17 LAB — COMPREHENSIVE METABOLIC PANEL
ALBUMIN: 3.5 g/dL (ref 3.5–5.0)
ALK PHOS: 51 U/L (ref 40–150)
ALT: 17 U/L (ref 0–55)
ANION GAP: 8 meq/L (ref 3–11)
AST: 14 U/L (ref 5–34)
BILIRUBIN TOTAL: 0.72 mg/dL (ref 0.20–1.20)
BUN: 5.4 mg/dL — ABNORMAL LOW (ref 7.0–26.0)
CO2: 26 meq/L (ref 22–29)
Calcium: 9.3 mg/dL (ref 8.4–10.4)
Chloride: 103 mEq/L (ref 98–109)
Creatinine: 0.6 mg/dL (ref 0.6–1.1)
Glucose: 137 mg/dl (ref 70–140)
Potassium: 3.9 mEq/L (ref 3.5–5.1)
Sodium: 137 mEq/L (ref 136–145)
TOTAL PROTEIN: 6.5 g/dL (ref 6.4–8.3)

## 2017-02-17 MED ORDER — HEPARIN SOD (PORK) LOCK FLUSH 100 UNIT/ML IV SOLN
500.0000 [IU] | Freq: Once | INTRAVENOUS | Status: AC | PRN
  Administered 2017-02-17: 500 [IU]
  Filled 2017-02-17: qty 5

## 2017-02-17 MED ORDER — FAMOTIDINE IN NACL 20-0.9 MG/50ML-% IV SOLN
INTRAVENOUS | Status: AC
  Filled 2017-02-17: qty 50

## 2017-02-17 MED ORDER — SODIUM CHLORIDE 0.9 % IV SOLN
80.0000 mg/m2 | Freq: Once | INTRAVENOUS | Status: AC
  Administered 2017-02-17: 162 mg via INTRAVENOUS
  Filled 2017-02-17: qty 27

## 2017-02-17 MED ORDER — DIPHENHYDRAMINE HCL 50 MG/ML IJ SOLN
INTRAMUSCULAR | Status: AC
Start: 2017-02-17 — End: 2017-02-17
  Filled 2017-02-17: qty 1

## 2017-02-17 MED ORDER — DEXAMETHASONE SODIUM PHOSPHATE 10 MG/ML IJ SOLN
INTRAMUSCULAR | Status: AC
  Filled 2017-02-17: qty 1

## 2017-02-17 MED ORDER — DIPHENHYDRAMINE HCL 50 MG/ML IJ SOLN
25.0000 mg | Freq: Once | INTRAMUSCULAR | Status: AC
  Administered 2017-02-17: 25 mg via INTRAVENOUS

## 2017-02-17 MED ORDER — SODIUM CHLORIDE 0.9% FLUSH
10.0000 mL | Freq: Once | INTRAVENOUS | Status: AC
  Administered 2017-02-17: 10 mL
  Filled 2017-02-17: qty 10

## 2017-02-17 MED ORDER — DEXAMETHASONE SODIUM PHOSPHATE 10 MG/ML IJ SOLN
10.0000 mg | Freq: Once | INTRAMUSCULAR | Status: AC
  Administered 2017-02-17: 10 mg via INTRAVENOUS

## 2017-02-17 MED ORDER — SODIUM CHLORIDE 0.9 % IV SOLN
Freq: Once | INTRAVENOUS | Status: AC
  Administered 2017-02-17: 15:00:00 via INTRAVENOUS

## 2017-02-17 MED ORDER — SODIUM CHLORIDE 0.9% FLUSH
10.0000 mL | INTRAVENOUS | Status: DC | PRN
  Administered 2017-02-17: 10 mL
  Filled 2017-02-17: qty 10

## 2017-02-17 MED ORDER — FAMOTIDINE IN NACL 20-0.9 MG/50ML-% IV SOLN
20.0000 mg | Freq: Once | INTRAVENOUS | Status: AC
  Administered 2017-02-17: 20 mg via INTRAVENOUS

## 2017-02-17 NOTE — Progress Notes (Signed)
[place]  [phone] [phone]  Clinic Follow up Note   Patient Care Team: Marton Redwood, MD as PCP - General (Internal Medicine) Melrose Nakayama, MD as Consulting Physician (Cardiothoracic Surgery) Fanny Skates, MD as Consulting Physician (General Surgery) Truitt Merle, MD as Consulting Physician (Hematology) 02/17/2017  CHIEF COMPLAINTS: F/u left breast cancer  SUMMARY OF ONCOLOGIC HISTORY: Oncology History   Cancer Staging Breast cancer of upper-inner quadrant of left female breast [facility]) Staging form: Breast, AJCC 8th Edition - Clinical stage from 07/10/2016: Stage 0 (cTis (DCIS), cN0, cM0, G3, ER: Positive, PR: Positive, HER2: Not Assessed) - Signed by Truitt Merle, MD on 07/28/2016 - Pathologic stage from 10/26/2016: Stage IA (pT1a, pN1a, cM0, G2, ER: Positive, PR: Positive, HER2: Negative) - Signed by Truitt Merle, MD on 11/11/2016       Breast cancer of upper-inner quadrant of left female breast ([place])   07/10/2016 Initial Biopsy    Diagnosis 1. 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 2. 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      07/10/2016 Receptors her2    ER 95%, PR 80-90%+, both strong staining, Ki67 80-90%      07/22/2016 Initial Diagnosis    Ductal carcinoma in situ (DCIS) of left breast      07/27/2016 Imaging    Breast MRI w wo contrast showed  1. Extensive mass and non mass enhancement along the lower inner aspect the left breast, lower outer quadrant, spanning 10.2 cm from anterior to posterior, including both biopsied lesions, as detailed above. This is all consistent with DCIS. 2. No other abnormal enhancement in the left breast. 3. No abnormal or enlarged axillary lymph nodes. 4. No evidence of  malignancy in the right breast      08/10/2016 Genetic Testing    ATM c.[id number] VUS identified on the Common Hereditary Cancer panel. The Hereditary Gene Panel offered by [facility] includes sequencing and/or deletion duplication testing of the following 46 genes: APC, ATM, AXIN2, BARD1, BMPR1A, BRCA1, BRCA2, BRIP1, CDH1, CDKN2A (p14ARF), CDKN2A (p16INK4a), CHEK2, CTNNA1, DICER1, EPCAM (Deletion/duplication testing only), GREM1 (promoter region deletion/duplication testing only), KIT, MEN1, MLH1, MSH2, MSH3, MSH6, MUTYH, NBN, NF1, NHTL1, PALB2, PDGFRA, PMS2, POLD1, POLE, PTEN, RAD50, RAD51C, RAD51D, SDHB, SDHC, SDHD, SMAD4, SMARCA4. STK11, TP53, TSC1, TSC2, and VHL.  The following gene was evaluated for sequence changes only: SDHA and HOXB13 c.251G>A variant only.  The report date is Aug 09, 2016.       10/13/2016 Surgery    LEFT TOTAL MASTECTOMY WITH LEFT AXILLARY SENTINEL LYMPH NODE BIOPSY By Dr. Ninfa Linden      10/13/2016 Pathology Results    Surgery Diagnosis 10/13/16  1. Lymph node, sentinel, biopsy, Left Axillary - METASTATIC CARCINOMA IN ONE LYMPH NODE (1/1). 2. Lymph node, sentinel, biopsy, Left - METASTATIC CARCINOMA IN ONE LYMPH NODE (1/1). 3. Breast, radical mastectomy (including lymph nodes), Left - INVASIVE DUCTAL CARCINOMA, 0.3 CM. - LYMPHOVASCULAR INVOLVEMENT BY CARCINOMA. - HIGH GRADE DUCTAL CARCINOMA IN SITU WITH CALCIFICATIONS AND NECROSIS, 5.5 CM. - DCIS FOCALLY 0.1 CM FROM CAUTERIZED MEDIAL MARGIN. - SEE ONCOLOGY TABLE AND COMMENT. 4. Skin , Left additional mastectomy flap - BENIGN FIBROADIPOSE TISSUE. - NO EVIDENCE OF MALIGNANCY.      10/30/2016 Miscellaneous    MammaPrint 10/31/26 Result:  High Risk,Luminal type B MPI: -  0.368 Average 10-year risk of recurrence untreated: 29%      11/18/2016 Imaging    CT CAP W Contrast 11/18/16 IMPRESSION: 1. Skin thickening and interstitial changes involving the left breast, likely related to radiation. Surgical changes are also  noted with a tissue expander in place. Probable postop fluid collection in the upper outer quadrant breast. No enlarged supraclavicular or axillary lymph nodes. 2. No CT findings suggesting metastatic disease involving the chest, abdomen or pelvis or bony structures. 3. Surgical changes from a left upper lobe wedge resection. No findings for recurrent tumor.      11/23/2016 Imaging    Bone Scan Whole body 11/23/16 IMPRESSION: No definite scintigraphic evidence of osseous metastatic disease as above.      11/25/2016 -  Chemotherapy    Adjuvant chemotherapy AC every 2 weeks for 4 cycles, 11/25/16-01/05/17, followed by Taxol weekly for 12 cycles starting 01/20/17      PREVIOUS THERAPY: Adjuvant chemotherapy AC every 2 weeks for 4 cycles 11/25/16-01/05/17   CURRENT THERAPY: Taxol weekly for 12 cycles started 01/20/17  INTERVAL HISTORY: Ms. standifer returns today for follow-up as scheduled prior to cycle 5 Taxol.  She is tolerating chemo well overall with mild fatigue, continues activities as normal.  Due to the grocery store and lifted heavy bags last week and noticed left arm swelling, she applied her lymphedema compression sleeve and swelling improved.  Continues to attend lymphedema clinic.  Her appetite is improved, she has found that she tolerates and enjoys [place] food.,  0-4 episodes per day, Imodium helps.  No nausea or vomiting, bone/joint pain, or numbness/tingling in hands or feet.  She has hyperpigmentation to hands and feet bilaterally and her feet stay cold.  REVIEW OF SYSTEMS:   Constitutional: Denies fevers, chills or abnormal weight loss (+) fatigue Eyes: Denies blurriness of vision Ears, nose, mouth, throat, and face: Denies mucositis or sore throat Respiratory: Denies dyspnea or wheezes (+) dry cough Cardiovascular: Denies palpitation, chest discomfort or lower extremity swelling Gastrointestinal:  Denies nausea, vomiting, constipation, heartburn or change in bowel habits  (+) diarrhea, 0-4 episodes per day, controlled with Imodium as needed Skin: Denies abnormal skin rashes (+) hyperpigmentation to hands and feet Lymphatics: Denies new lymphadenopathy or easy bruising (+) mild lymphedema to left upper extremity, improves with sleeve Neurological:Denies numbness, tingling or new weaknesses Behavioral/Psych: Mood is stable, no new changes  All other systems were reviewed with the patient and are negative.  MEDICAL HISTORY:  Past Medical History:  Diagnosis Date  . Anxiety   . Arthritis   . Cancer ([place]) 10/08/2016   left breast cancer  . Diabetes mellitus without complication ([place])   . Dyslipidemia   . Family history of breast cancer   . Family history of ovarian cancer   . Family history of prostate cancer   . GERD (gastroesophageal reflux disease)    nexium if needed  . Heart murmur   . Hypertension   . Hypothyroid   . Osteopenia   . Pneumonia 10/08/2016   June 2013    SURGICAL HISTORY: Past Surgical History:  Procedure Laterality Date  . ABDOMINAL HYSTERECTOMY    . BREAST RECONSTRUCTION WITH PLACEMENT OF TISSUE EXPANDER AND FLEX HD (ACELLULAR HYDRATED DERMIS) Left 10/13/2016   Procedure: LEFT BREAST RECONSTRUCTION WITH PLACEMENT OF TISSUE EXPANDER AND ALLODERM;  Surgeon: Irene Limbo, MD;  Location: [facility];  Service: Plastics;  Laterality: Left;  . FOOT SURGERY     bi-lat/ repaired hammer toes  . MASTECTOMY W/  SENTINEL NODE BIOPSY Left 10/13/2016  . MASTECTOMY W/ SENTINEL NODE BIOPSY Left 10/13/2016   Procedure: LEFT TOTAL MASTECTOMY WITH LEFT AXILLARY SENTINEL LYMPH NODE BIOPSY;  Surgeon: Coralie Keens, MD;  Location: [facility];  Service: General;  Laterality: Left;  . PORTACATH PLACEMENT Right 11/24/2016   Procedure: INSERTION PORT-A-CATH;  Surgeon: Fanny Skates, MD;  Location: [facility];  Service: General;  Laterality: Right;  . TUBAL LIGATION    . VIDEO ASSISTED THORACOSCOPY (VATS)/WEDGE RESECTION Left 02/25/2015    Procedure: VIDEO ASSISTED THORACOSCOPY (VATS)/ LUL WEDGE RESECTION with ON Q placement.;  Surgeon: Melrose Nakayama, MD;  Location: [facility];  Service: Thoracic;  Laterality: Left;    I have reviewed the social history and family history with the patient and they are unchanged from previous note.  ALLERGIES:  is allergic to vicodin [hydrocodone-acetaminophen].  MEDICATIONS:  Current Outpatient Medications  Medication Sig Dispense Refill  . aspirin 81 MG tablet Take 81 mg by mouth daily.    Marland Kitchen atorvastatin (LIPITOR) 40 MG tablet Take 40 mg by mouth daily.    . calcium-vitamin D 250-100 MG-UNIT tablet Take 2 tablets by mouth daily.     . ergocalciferol (VITAMIN D2) 50000 UNITS capsule Take 50,000 Units by mouth every Friday.     Marland Kitchen guaiFENesin-codeine 100-10 MG/5ML syrup Take 5 mLs by mouth every 6 (six) hours as needed for cough. 120 mL 0  . ibuprofen (ADVIL,MOTRIN) 600 MG tablet Take 1 tablet (600 mg total) by mouth every 8 (eight) hours as needed. 15 tablet 0  . levothyroxine (SYNTHROID, LEVOTHROID) 88 MCG tablet Take 88 mcg by mouth daily before breakfast.     . lidocaine-prilocaine (EMLA) cream Apply to affected area once 30 g 3  . metFORMIN (GLUCOPHAGE) 500 MG tablet Take 1 tablet (500 mg total) 2 (two) times daily with a meal by mouth. 60 tablet 2  . methocarbamol (ROBAXIN) 500 MG tablet Take 1 tablet (500 mg total) by mouth every 8 (eight) hours as needed for muscle spasms. 30 tablet 0  . olmesartan (BENICAR) 20 MG tablet Take 20 mg by mouth daily.    . traMADol (ULTRAM) 50 MG tablet Take 50 mg by mouth every 6 (six) hours as needed for moderate pain or severe pain.     Marland Kitchen HYDROcodone-acetaminophen (NORCO) 5-325 MG tablet Take 1-2 tablets by mouth every 6 (six) hours as needed for moderate pain or severe pain. (Patient not taking: Reported on 02/17/2017) 20 tablet 0  . ondansetron (ZOFRAN) 8 MG tablet Take 1 tablet (8 mg total) by mouth 2 (two) times daily as needed. Start on the third day  after chemotherapy. (Patient not taking: Reported on 02/17/2017) 30 tablet 1  . prochlorperazine (COMPAZINE) 10 MG tablet Take 1 tablet (10 mg total) by mouth every 6 (six) hours as needed (Nausea or vomiting). (Patient not taking: Reported on 02/17/2017) 30 tablet 1   No current facility-administered medications for this visit.     PHYSICAL EXAMINATION: ECOG PERFORMANCE STATUS: 1 - Symptomatic but completely ambulatory  Vitals:   02/17/17 1300  BP: 115/63  Pulse: 80  Resp: 18  Temp: 98.5 F (36.9 C)  SpO2: 99%   Filed Weights   02/17/17 1300  Weight: 181 lb 3.2 oz (82.2 kg)    GENERAL:alert, no distress and comfortable SKIN: skin color, texture, turgor are normal, no rashes or significant lesions EYES: normal, Conjunctiva are pink and non-injected, sclera clear OROPHARYNX:no exudate, no erythema and lips, buccal mucosa, and tongue normal  NECK: supple, thyroid normal size, non-tender, without nodularity LYMPH:  no palpable cervical, supraclavicular, or axillary lymphadenopathy LUNGS: clear to auscultation laterally with normal breathing effort HEART: regular rate & rhythm and no murmurs and no lower extremity edema ABDOMEN:abdomen soft, non-tender and normal bowel sounds.  No palpable hepatomegaly Musculoskeletal:no cyanosis of digits and no clubbing  NEURO: alert & oriented x 3 with fluent speech, no focal motor/sensory deficits PAC without erythema  LABORATORY DATA:  I have reviewed the data as listed CBC Latest Ref Rng & Units 02/17/2017 02/10/2017 02/03/2017  WBC 3.9 - 10.3 10e3/uL 3.4(L) 2.5(L) 2.4(L)  Hemoglobin 11.6 - 15.9 g/dL 9.5(L) 9.7(L) 9.4(L)  Hematocrit 34.8 - 46.6 % 29.4(L) 29.6(L) 28.9(L)  Platelets 145 - 400 10e3/uL 245 300 270    CMP Latest Ref Rng & Units 02/17/2017 02/10/2017 02/03/2017  Glucose 70 - 140 mg/dl 137 104 109  BUN 7.0 - 26.0 mg/dL 5.4(L) 5.8(L) 5.1(L)  Creatinine 0.6 - 1.1 mg/dL 0.6 0.7 0.6  Sodium 136 - 145 mEq/L 137 138 139    Potassium 3.5 - 5.1 mEq/L 3.9 4.3 4.0  Chloride 101 - 111 mmol/L - - -  CO2 22 - 29 mEq/L '26 27 25  ' Calcium 8.4 - 10.4 mg/dL 9.3 9.3 9.1  Total Protein 6.4 - 8.3 g/dL 6.5 6.7 6.5  Total Bilirubin 0.20 - 1.20 mg/dL 0.72 0.55 0.63  Alkaline Phos 40 - 150 U/L 51 47 52  AST 5 - 34 U/L '14 19 27  ' ALT 0 - 55 U/L '17 21 31    ' RADIOGRAPHIC STUDIES: I have personally reviewed the radiological images as listed and agreed with the findings in the report. No results found.   ASSESSMENT & PLAN: JEHAN RANGANATHAN is 61 y.o. who present to the clinic with Ductal carcinoma in situ (DCIS) of left breast  1. 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 Arthritis 3. Osteopenia 4. Smoking cessation 5. Genetics 6. Anemia secondary to chemotherapy 7. History of pulmonary amyloidosis 8. Neutropenia 9. Therapy induced hyperglycemia 10.  Hyperpigmentation to hands and feet  Ms. siegrist appears stable today, tolerating chemotherapy well overall.  Diarrhea is well controlled with Imodium as needed; hyperpigmentation to hands and feet, secondary to chemotherapy. We discussed this will likely resolve slowly over time after completion of chemo. Vital signs and weight stable.  Cmet unremarkable; mild anemia on CBC, Hgb 9.5 stable overall; she does not require transfusion at this time.  Neutropenia resolved, ANC 2.2; CBC otherwise stable.  She will proceed with cycle 5 Taxol today; return in 1 week for cycle 6; she will follow-up with MD in 2 weeks.  PLAN: Labs reviewed, proceed with cycle 5 Taxol today Return in 1 week for cycle 6 Taxol F/u with Dr. Burr Medico in 2 weeks prior to cycle 7  All questions were answered. The patient knows to call the clinic with any problems, questions or concerns. No barriers to learning was detected.      Alla Feeling, NP 02/17/17

## 2017-02-17 NOTE — Patient Instructions (Signed)
[facility] Discharge Instructions for Patients Receiving Chemotherapy  Today you received the following chemotherapy agents:  Taxol.  To help prevent nausea and vomiting after your treatment, we encourage you to take your nausea medication as directed.   If you develop nausea and vomiting that is not controlled by your nausea medication, call the clinic.   BELOW ARE SYMPTOMS THAT SHOULD BE REPORTED IMMEDIATELY:  *FEVER GREATER THAN 100.5 F  *CHILLS WITH OR WITHOUT FEVER  NAUSEA AND VOMITING THAT IS NOT CONTROLLED WITH YOUR NAUSEA MEDICATION  *UNUSUAL SHORTNESS OF BREATH  *UNUSUAL BRUISING OR BLEEDING  TENDERNESS IN MOUTH AND THROAT WITH OR WITHOUT PRESENCE OF ULCERS  *URINARY PROBLEMS  *BOWEL PROBLEMS  UNUSUAL RASH Items with * indicate a potential emergency and should be followed up as soon as possible.  Feel free to call the clinic should you have any questions or concerns. The clinic phone number is [phone].  Please show the CHEMO ALERT CARD at check-in to the Emergency Department and triage nurse.   

## 2017-02-22 ENCOUNTER — Ambulatory Visit: Payer: BC Managed Care – PPO | Attending: Plastic Surgery

## 2017-02-22 DIAGNOSIS — M6281 Muscle weakness (generalized): Secondary | ICD-10-CM | POA: Diagnosis present

## 2017-02-22 DIAGNOSIS — M25512 Pain in left shoulder: Secondary | ICD-10-CM | POA: Insufficient documentation

## 2017-02-22 DIAGNOSIS — M25612 Stiffness of left shoulder, not elsewhere classified: Secondary | ICD-10-CM | POA: Insufficient documentation

## 2017-02-22 DIAGNOSIS — R600 Localized edema: Secondary | ICD-10-CM | POA: Insufficient documentation

## 2017-02-22 NOTE — Therapy (Signed)
[place], [place], [address] Phone: [phone]   Fax:  [phone]  Physical Therapy Treatment  Patient Details  Name: Taylor Walsh MRN: [id number] Date of Birth: October 10, 1955 Referring Provider: [place]   Encounter Date: 02/22/2017  PT End of Session - 02/22/17 0933    Visit Number  14    Number of Visits  16    Date for PT Re-Evaluation  01/06/17    PT Start Time  0853    PT Stop Time  0933    PT Time Calculation (min)  40 min    Activity Tolerance  Patient tolerated treatment well    Behavior During Therapy  [facility] for tasks assessed/performed       Past Medical History:  Diagnosis Date  . Anxiety   . Arthritis   . Cancer ([place]) 10/08/2016   left breast cancer  . Diabetes mellitus without complication ([place])   . Dyslipidemia   . Family history of breast cancer   . Family history of ovarian cancer   . Family history of prostate cancer   . GERD (gastroesophageal reflux disease)    nexium if needed  . Heart murmur   . Hypertension   . Hypothyroid   . Osteopenia   . Pneumonia 10/08/2016   June 2013    Past Surgical History:  Procedure Laterality Date  . ABDOMINAL HYSTERECTOMY    . BREAST RECONSTRUCTION WITH PLACEMENT OF TISSUE EXPANDER AND FLEX HD (ACELLULAR HYDRATED DERMIS) Left 10/13/2016   Procedure: LEFT BREAST RECONSTRUCTION WITH PLACEMENT OF TISSUE EXPANDER AND ALLODERM;  Surgeon: Irene Limbo, MD;  Location: [facility];  Service: Plastics;  Laterality: Left;  . FOOT SURGERY     bi-lat/ repaired hammer toes  . MASTECTOMY W/ SENTINEL NODE BIOPSY Left 10/13/2016  . MASTECTOMY W/ SENTINEL NODE BIOPSY Left 10/13/2016   Procedure: LEFT TOTAL MASTECTOMY WITH LEFT AXILLARY SENTINEL LYMPH NODE BIOPSY;  Surgeon: Coralie Keens, MD;  Location: [facility];  Service: General;  Laterality: Left;  . PORTACATH PLACEMENT Right 11/24/2016   Procedure: INSERTION PORT-A-CATH;  Surgeon: Fanny Skates,  MD;  Location: [facility];  Service: General;  Laterality: Right;  . TUBAL LIGATION    . VIDEO ASSISTED THORACOSCOPY (VATS)/WEDGE RESECTION Left 02/25/2015   Procedure: VIDEO ASSISTED THORACOSCOPY (VATS)/ LUL WEDGE RESECTION with ON Q placement.;  Surgeon: Melrose Nakayama, MD;  Location: [facility];  Service: Thoracic;  Laterality: Left;    There were no vitals filed for this visit.  Subjective Assessment - 02/22/17 0911    Subjective  I am feeling better than last time. I got through the [employer] chemo treatment but it took alot out of me. I'm almost half way through 12 weeks of weekly treatments. I've been doing well and can make this my last day. Last week I lifted some bags that were too heavy and my hand swelled a little and I had a pain run all the way down my arm. Wore my compression sleeve for about 2 days which moved the pian up to my elbow and now I haven't felt it in last 2 days, I even saw a cord down my arm.    Pertinent History  Presented following screening MMG with new left breast calcifications. 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 MRI showed mass and NME spanning 10.2 cm from anterior to posterior, including both biopsied lesions. No abnormal or enlarged LN, and no  evidence of malignancy in the right breast. Final pathology mastectomy 0.3 cm IDC, 5.5 cm high grade DCIS focally 0.1 cm from medial margin, 2/2 SLN +, +LVI. 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  No/denies         [facility] PT Assessment - 02/22/17 0001      AROM   Left Shoulder Flexion  150 Degrees Able to correct compensations    Left Shoulder  ABduction  159 Degrees                  [facility] Adult PT Treatment/Exercise - 02/22/17 0001      Shoulder Exercises: Standing   Other Standing Exercises  Completed instruction of Strength ABC Packet from core exercise to the end of packet              PT Education - 02/22/17 0918    Education provided  Yes    Education Details  cording and completed Strength ABC Packet    Person(s) Educated  Patient    Methods  Explanation;Demonstration;Handout    Comprehension  Verbalized understanding;Returned demonstration             [facility] Goals - 02/22/17 [phone]      CC Long Term Goal  #1   Title  Pt will be able to independently verbalize lymphedema risk reduction practices    Baseline  12/09/16- pt able to recall independently    Status  Achieved      CC Long Term Goal  #2   Title  Pt will be independent in a home exercise program for continued strengthening and stretching    Baseline  Completed instruction of Strenght ABC Packet-02/22/17    Status  Achieved      CC Long Term Goal  #3   Title  Pt will report a 75% improvement in left lateral trunk swelling to allow improved comfort when left arm is at side    Baseline  75% as of 12/01/16, though she still feels it some; 99% improvement at this time-02/22/17    Status  Achieved      CC Long Term Goal  #4   Title  Pt will demonstrate 160 degrees of left shoulder abduction to allow pt to reach out to sides.    Baseline  101, 12/09/16- 165 degrees; 150 degrees today but pt has been able to get up to 165 degrees (had cording pop up last week affecting ROM this week)-02/22/17    Status  Achieved      CC Long Term Goal  #5   Title  Pt will demonstrate 160 degrees of left shoulder flexion to allow her to reach items overhead    Baseline  120, 12/09/16- 142 degrees; 150 degrees (pt had cording pop up last week affecting ROM this week)-02/22/17    Status  Partially Met         Plan - 02/22/17 0934    Clinical  Impression Statement  Pt has done well since being here last (over a month ago due to chemo treatments and hadn't felt well enough to come) and has met all but one goal. Her  ROM was still slightly limited at end ROM but she had cording pop up last week when lifting heavy bags. Instructed her in what cording was and issued handout and also instructed her to cont wearing sleeve and stretch until it fully resolves. Completed instruction of Strength ABC Packet which she did well with and pt is ready to D/C at this time.     Clinical Impairments Affecting Rehab Potential  pt. has started chemotherapy    PT Frequency  2x / week    PT Duration  4 weeks    PT Treatment/Interventions  ADLs/Self Care Home Management;Therapeutic exercise;Therapeutic activities;Patient/family education;Passive range of motion;Scar mobilization;Manual lymph drainage;Manual techniques;Taping    PT Next Visit Plan  D/C this visit.    PT Home Exercise Plan  supine scap exericses; Strength ABC Packet    Consulted and Agree with Plan of Care  Patient       Patient will benefit from skilled therapeutic intervention in order to improve the following deficits and impairments:  Decreased knowledge of precautions, Increased edema, Decreased range of motion, Decreased strength, Postural dysfunction, Pain, Increased fascial restricitons  Visit Diagnosis: Stiffness of left shoulder, not elsewhere classified  Acute pain of left shoulder  Muscle weakness (generalized)  Localized edema     Problem List Patient Active Problem List   Diagnosis Date Noted  . Anemia due to antineoplastic chemotherapy 02/02/2017  . Port-A-Cath in place 01/06/2017  . Encounter for antineoplastic chemotherapy 12/10/2016  . Hyperglycemia 12/10/2016  . DCIS (ductal carcinoma in situ) of breast 10/13/2016  . Genetic testing 08/10/2016  . Family history of breast cancer   . Family history of prostate cancer   . Family history of ovarian cancer   .  Breast cancer of upper-inner quadrant of left female breast ([place]) 07/22/2016  . Cigarette smoker 05/20/2016  . 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 Dyspnea 06/24/2015  . Nodule of left lung 02/25/2015  . Hypertension 01/28/2015  . Hypothyroidism 01/28/2015  . GERD (gastroesophageal reflux disease) 01/28/2015  . Lung mass 01/07/2015    Otelia Limes, PTA 02/22/2017, 9:37 AM   PHYSICAL THERAPY DISCHARGE SUMMARY  Visits from Start of Care: 14  Current functional level related to goals / functional outcomes: All goals met and patient doing well except slightly limited shoulder flexion ROM compared to baseline but is still functional.   Remaining deficits: none   Education / Equipment: Home exercise program  Plan: Patient agrees to discharge.  Patient goals were met. Patient is being discharged due to meeting the stated rehab goals.  ?????        Annia Friendly, [place] 02/22/17 9:52 AM  [place], [place], [address] Phone: [phone]   Fax:  [phone]  Name: Taylor Walsh MRN: [id number] Date of Birth: 1955/07/02

## 2017-02-22 NOTE — Patient Instructions (Signed)

## 2017-02-24 ENCOUNTER — Ambulatory Visit: Payer: BC Managed Care – PPO

## 2017-02-24 ENCOUNTER — Other Ambulatory Visit: Payer: Self-pay | Admitting: Hematology

## 2017-02-24 ENCOUNTER — Other Ambulatory Visit (HOSPITAL_BASED_OUTPATIENT_CLINIC_OR_DEPARTMENT_OTHER): Payer: BC Managed Care – PPO

## 2017-02-24 ENCOUNTER — Ambulatory Visit (HOSPITAL_BASED_OUTPATIENT_CLINIC_OR_DEPARTMENT_OTHER): Payer: BC Managed Care – PPO

## 2017-02-24 VITALS — BP 98/64 | HR 82 | Temp 98.3°F | Resp 18

## 2017-02-24 DIAGNOSIS — C50212 Malignant neoplasm of upper-inner quadrant of left female breast: Secondary | ICD-10-CM

## 2017-02-24 DIAGNOSIS — Z170 Estrogen receptor positive status [ER+]: Principal | ICD-10-CM

## 2017-02-24 DIAGNOSIS — Z95828 Presence of other vascular implants and grafts: Secondary | ICD-10-CM

## 2017-02-24 DIAGNOSIS — Z5111 Encounter for antineoplastic chemotherapy: Secondary | ICD-10-CM

## 2017-02-24 LAB — CBC WITH DIFFERENTIAL/PLATELET
BASO%: 0.4 % (ref 0.0–2.0)
Basophils Absolute: 0 10*3/uL (ref 0.0–0.1)
EOS ABS: 0 10*3/uL (ref 0.0–0.5)
EOS%: 1.8 % (ref 0.0–7.0)
HCT: 32.5 % — ABNORMAL LOW (ref 34.8–46.6)
HEMOGLOBIN: 10.4 g/dL — AB (ref 11.6–15.9)
LYMPH%: 29.1 % (ref 14.0–49.7)
MCH: 29 pg (ref 25.1–34.0)
MCHC: 32 g/dL (ref 31.5–36.0)
MCV: 90.5 fL (ref 79.5–101.0)
MONO#: 0.2 10*3/uL (ref 0.1–0.9)
MONO%: 8.8 % (ref 0.0–14.0)
NEUT%: 59.9 % (ref 38.4–76.8)
NEUTROS ABS: 1.4 10*3/uL — AB (ref 1.5–6.5)
Platelets: 263 10*3/uL (ref 145–400)
RBC: 3.59 10*6/uL — AB (ref 3.70–5.45)
RDW: 21.5 % — AB (ref 11.2–14.5)
WBC: 2.3 10*3/uL — AB (ref 3.9–10.3)
lymph#: 0.7 10*3/uL — ABNORMAL LOW (ref 0.9–3.3)

## 2017-02-24 LAB — COMPREHENSIVE METABOLIC PANEL
ALBUMIN: 3.8 g/dL (ref 3.5–5.0)
ALK PHOS: 56 U/L (ref 40–150)
ALT: 19 U/L (ref 0–55)
ANION GAP: 8 meq/L (ref 3–11)
AST: 20 U/L (ref 5–34)
BUN: 5 mg/dL — AB (ref 7.0–26.0)
CALCIUM: 9.4 mg/dL (ref 8.4–10.4)
CO2: 24 mEq/L (ref 22–29)
Chloride: 107 mEq/L (ref 98–109)
Creatinine: 0.7 mg/dL (ref 0.6–1.1)
EGFR: >60 mL/min/{1.73_m2} (ref >60)
Glucose: 98 mg/dl (ref 70–140)
POTASSIUM: 4.2 meq/L (ref 3.5–5.1)
Sodium: 139 mEq/L (ref 136–145)
Total Bilirubin: 0.74 mg/dL (ref 0.20–1.20)
Total Protein: 7 g/dL (ref 6.4–8.3)

## 2017-02-24 MED ORDER — SODIUM CHLORIDE 0.9% FLUSH
10.0000 mL | Freq: Once | INTRAVENOUS | Status: AC
  Administered 2017-02-24: 10 mL
  Filled 2017-02-24: qty 10

## 2017-02-24 MED ORDER — FAMOTIDINE IN NACL 20-0.9 MG/50ML-% IV SOLN
INTRAVENOUS | Status: AC
Start: 2017-02-24 — End: 2017-02-24
  Filled 2017-02-24: qty 50

## 2017-02-24 MED ORDER — DEXAMETHASONE SODIUM PHOSPHATE 10 MG/ML IJ SOLN
10.0000 mg | Freq: Once | INTRAMUSCULAR | Status: AC
  Administered 2017-02-24: 10 mg via INTRAVENOUS

## 2017-02-24 MED ORDER — SODIUM CHLORIDE 0.9 % IV SOLN
Freq: Once | INTRAVENOUS | Status: AC
  Administered 2017-02-24: 12:00:00 via INTRAVENOUS

## 2017-02-24 MED ORDER — PACLITAXEL CHEMO INJECTION 300 MG/50ML
80.0000 mg/m2 | Freq: Once | INTRAVENOUS | Status: AC
  Administered 2017-02-24: 162 mg via INTRAVENOUS
  Filled 2017-02-24: qty 27

## 2017-02-24 MED ORDER — DIPHENHYDRAMINE HCL 50 MG/ML IJ SOLN
25.0000 mg | Freq: Once | INTRAMUSCULAR | Status: AC
  Administered 2017-02-24: 25 mg via INTRAVENOUS

## 2017-02-24 MED ORDER — SODIUM CHLORIDE 0.9% FLUSH
10.0000 mL | INTRAVENOUS | Status: DC | PRN
  Administered 2017-02-24: 10 mL
  Filled 2017-02-24: qty 10

## 2017-02-24 MED ORDER — DIPHENHYDRAMINE HCL 50 MG/ML IJ SOLN
INTRAMUSCULAR | Status: AC
  Filled 2017-02-24: qty 1

## 2017-02-24 MED ORDER — DEXAMETHASONE SODIUM PHOSPHATE 10 MG/ML IJ SOLN
INTRAMUSCULAR | Status: AC
  Filled 2017-02-24: qty 1

## 2017-02-24 MED ORDER — HEPARIN SOD (PORK) LOCK FLUSH 100 UNIT/ML IV SOLN
500.0000 [IU] | Freq: Once | INTRAVENOUS | Status: AC | PRN
  Administered 2017-02-24: 500 [IU]
  Filled 2017-02-24: qty 5

## 2017-02-24 MED ORDER — FAMOTIDINE IN NACL 20-0.9 MG/50ML-% IV SOLN
20.0000 mg | Freq: Once | INTRAVENOUS | Status: AC
  Administered 2017-02-24: 20 mg via INTRAVENOUS

## 2017-02-24 NOTE — Patient Instructions (Signed)
[facility] Discharge Instructions for Patients Receiving Chemotherapy  Today you received the following chemotherapy agents:  Taxol.  To help prevent nausea and vomiting after your treatment, we encourage you to take your nausea medication as directed.   If you develop nausea and vomiting that is not controlled by your nausea medication, call the clinic.   BELOW ARE SYMPTOMS THAT SHOULD BE REPORTED IMMEDIATELY:  *FEVER GREATER THAN 100.5 F  *CHILLS WITH OR WITHOUT FEVER  NAUSEA AND VOMITING THAT IS NOT CONTROLLED WITH YOUR NAUSEA MEDICATION  *UNUSUAL SHORTNESS OF BREATH  *UNUSUAL BRUISING OR BLEEDING  TENDERNESS IN MOUTH AND THROAT WITH OR WITHOUT PRESENCE OF ULCERS  *URINARY PROBLEMS  *BOWEL PROBLEMS  UNUSUAL RASH Items with * indicate a potential emergency and should be followed up as soon as possible.  Feel free to call the clinic should you have any questions or concerns. The clinic phone number is [phone].  Please show the CHEMO ALERT CARD at check-in to the Emergency Department and triage nurse.   

## 2017-02-24 NOTE — Patient Instructions (Signed)
Implanted Port Home Guide An implanted port is a type of central line that is placed under the skin. Central lines are used to provide IV access when treatment or nutrition needs to be given through a person's veins. Implanted ports are used for long-term IV access. An implanted port may be placed because:  You need IV medicine that would be irritating to the small veins in your hands or arms.  You need long-term IV medicines, such as antibiotics.  You need IV nutrition for a long period.  You need frequent blood draws for lab tests.  You need dialysis.  Implanted ports are usually placed in the chest area, but they can also be placed in the upper arm, the abdomen, or the leg. An implanted port has two main parts:  Reservoir. The reservoir is round and will appear as a small, raised area under your skin. The reservoir is the part where a needle is inserted to give medicines or draw blood.  Catheter. The catheter is a thin, flexible tube that extends from the reservoir. The catheter is placed into a large vein. Medicine that is inserted into the reservoir goes into the catheter and then into the vein.  How will I care for my incision site? Do not get the incision site wet. Bathe or shower as directed by your health care provider. How is my port accessed? Special steps must be taken to access the port:  Before the port is accessed, a numbing cream can be placed on the skin. This helps numb the skin over the port site.  Your health care provider uses a sterile technique to access the port. ? Your health care provider must put on a mask and sterile gloves. ? The skin over your port is cleaned carefully with an antiseptic and allowed to dry. ? The port is gently pinched between sterile gloves, and a needle is inserted into the port.  Only "non-coring" port needles should be used to access the port. Once the port is accessed, a blood return should be checked. This helps ensure that the port  is in the vein and is not clogged.  If your port needs to remain accessed for a constant infusion, a clear (transparent) bandage will be placed over the needle site. The bandage and needle will need to be changed every week, or as directed by your health care provider.  Keep the bandage covering the needle clean and dry. Do not get it wet. Follow your health care provider's instructions on how to take a shower or bath while the port is accessed.  If your port does not need to stay accessed, no bandage is needed over the port.  What is flushing? Flushing helps keep the port from getting clogged. Follow your health care provider's instructions on how and when to flush the port. Ports are usually flushed with saline solution or a medicine called heparin. The need for flushing will depend on how the port is used.  If the port is used for intermittent medicines or blood draws, the port will need to be flushed: ? After medicines have been given. ? After blood has been drawn. ? As part of routine maintenance.  If a constant infusion is running, the port may not need to be flushed.  How long will my port stay implanted? The port can stay in for as long as your health care provider thinks it is needed. When it is time for the port to come out, surgery will be   done to remove it. The procedure is similar to the one performed when the port was put in. When should I seek immediate medical care? When you have an implanted port, you should seek immediate medical care if:  You notice a bad smell coming from the incision site.  You have swelling, redness, or drainage at the incision site.  You have more swelling or pain at the port site or the surrounding area.  You have a fever that is not controlled with medicine.  This information is not intended to replace advice given to you by your health care provider. Make sure you discuss any questions you have with your health care provider. Document  Released: 03/16/2005 Document Revised: 08/22/2015 Document Reviewed: 11/21/2012 [employer] Interactive Patient Education  2017 [employer]  

## 2017-02-24 NOTE — Progress Notes (Signed)
OK to treat with today's lab values per Dr. Burr Medico.  Wylene Simmer, BSN, RN 02/24/2017 12:01 PM

## 2017-02-26 ENCOUNTER — Encounter: Payer: BC Managed Care – PPO | Admitting: Physical Therapy

## 2017-03-01 NOTE — Progress Notes (Signed)
[place]  [phone] [phone]  Clinic Follow-up Note   Patient Care Team: Marton Redwood, MD as PCP - General (Internal Medicine) Melrose Nakayama, MD as Consulting Physician (Cardiothoracic Surgery) Fanny Skates, MD as Consulting Physician (General Surgery) Truitt Merle, MD as Consulting Physician (Hematology) 03/03/2017   CHIEF COMPLAINTS F/U left breast cancer   Oncology History   Cancer Staging Breast cancer of upper-inner quadrant of left female breast [facility]) Staging form: Breast, AJCC 8th Edition - Clinical stage from 07/10/2016: Stage 0 (cTis (DCIS), cN0, cM0, G3, ER: Positive, PR: Positive, HER2: Not Assessed) - Signed by Truitt Merle, MD on 07/28/2016 - Pathologic stage from 10/26/2016: Stage IA (pT1a, pN1a, cM0, G2, ER: Positive, PR: Positive, HER2: Negative) - Signed by Truitt Merle, MD on 11/11/2016       Breast cancer of upper-inner quadrant of left female breast ([place])   07/10/2016 Initial Biopsy    Diagnosis 1. 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 2. 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      07/10/2016 Receptors her2    ER 95%, PR 80-90%+, both strong staining, Ki67 80-90%      07/22/2016 Initial Diagnosis    Ductal carcinoma in situ (DCIS) of left breast      07/27/2016 Imaging    Breast MRI w wo contrast showed  1. Extensive mass and non mass enhancement along the lower inner aspect the left breast, lower outer quadrant, spanning 10.2 cm from anterior to posterior, including both biopsied lesions, as detailed above. This is all consistent with DCIS. 2. No other abnormal enhancement in the left breast. 3. No abnormal or enlarged axillary lymph nodes. 4. No evidence of malignancy in the right breast      08/10/2016 Genetic Testing    ATM c.4066A>G VUS identified on the Common Hereditary Cancer panel. The Hereditary Gene Panel offered by [facility] includes sequencing and/or deletion duplication testing of the following 46 genes: APC, ATM, AXIN2, BARD1, BMPR1A, BRCA1, BRCA2, BRIP1, CDH1, CDKN2A (p14ARF), CDKN2A (p16INK4a), CHEK2, CTNNA1, DICER1, EPCAM (Deletion/duplication testing only), GREM1 (promoter region deletion/duplication testing only), KIT, MEN1, MLH1, MSH2, MSH3, MSH6, MUTYH, NBN, NF1, NHTL1, PALB2, PDGFRA, PMS2, POLD1, POLE, PTEN, RAD50, RAD51C, RAD51D, SDHB, SDHC, SDHD, SMAD4, SMARCA4. STK11, TP53, TSC1, TSC2, and VHL.  The following gene was evaluated for sequence changes only: SDHA and HOXB13 c.251G>A variant only.  The report date is Aug 09, 2016.       10/13/2016 Surgery    LEFT TOTAL MASTECTOMY WITH LEFT AXILLARY SENTINEL LYMPH NODE BIOPSY By Dr. Ninfa Linden      10/13/2016 Pathology Results    Surgery Diagnosis 10/13/16  1. Lymph node, sentinel, biopsy, Left Axillary - METASTATIC CARCINOMA IN ONE LYMPH NODE (1/1). 2. Lymph node, sentinel, biopsy, Left - METASTATIC CARCINOMA IN ONE LYMPH NODE (1/1). 3. Breast, radical mastectomy (including lymph nodes), Left - INVASIVE DUCTAL CARCINOMA, 0.3 CM. - LYMPHOVASCULAR INVOLVEMENT BY CARCINOMA. - HIGH GRADE DUCTAL CARCINOMA IN SITU WITH CALCIFICATIONS AND NECROSIS, 5.5 CM. - DCIS FOCALLY 0.1 CM FROM CAUTERIZED MEDIAL MARGIN. - SEE ONCOLOGY TABLE AND COMMENT. 4. Skin , Left additional mastectomy flap - BENIGN FIBROADIPOSE TISSUE. - NO EVIDENCE OF MALIGNANCY.      10/30/2016 Miscellaneous    MammaPrint 10/31/26 Result:  High Risk,Luminal type B MPI: -0.368 Average 10-year risk  of recurrence untreated: 29%      11/18/2016 Imaging    CT CAP W Contrast 11/18/16 IMPRESSION: 1. Skin thickening and interstitial changes involving the left breast, likely related to radiation. Surgical changes are also noted with a tissue expander in  place. Probable postop fluid collection in the upper outer quadrant breast. No enlarged supraclavicular or axillary lymph nodes. 2. No CT findings suggesting metastatic disease involving the chest, abdomen or pelvis or bony structures. 3. Surgical changes from a left upper lobe wedge resection. No findings for recurrent tumor.      11/23/2016 Imaging    Bone Scan Whole body 11/23/16 IMPRESSION: No definite scintigraphic evidence of osseous metastatic disease as above.      11/25/2016 -  Chemotherapy    Adjuvant chemotherapy AC every 2 weeks for 4 cycles, 11/25/16-01/05/17, followed by Taxol weekly for 12 cycles starting 01/20/17         HISTORY OF PRESENTING ILLNESS (07/28/16):  Taylor Walsh Vent 61 y.o. 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 She presents to the clinic today by herself. She was referred by her surgeon Dr. Dalbert Batman.   This was discovered by routine screening mammogram. She denies any palpable breast mass or adenopathy. She denies any other new symptoms. She was contacted for her need of a biopsy to further investigate the calcification found in her mammogram. A biopsy was done leading to a diagnosis of her DCIS. She reports having arthritis in the right knee. She reported having a tubal ligation and hysterectomy. She also has family history of breast cancer with mother and sister. She reports to live with her daughter and 2 of her grandchildren. She reports of having history of HTN, arthritis, Thyroidism, being Pre-diabetic, Osteopenia and is currently back to Smoking. She also reports her brother has a history of blood clots and he was told that it was hereditary which she is following up with Dr. Brigitte Pulse about testing.   GYN HISTORY  Menarchal: 11 LMP: 36 before hysterectomy Contraceptive: BN/A HRT: no GP:G2P2    PREVIOUS THERAPY: Adjuvant chemotherapy AC every 2 weeks for 4 cycles 11/25/16-01/05/17   CURRENT THERAPY: Taxol weekly for 12 cycles  started 01/20/17, Add Granix injection on Day 2 starting with cycle 8.    INTERVAL HISTORY:   Taylor Walsh is here for a follow up and 7th cycle taxol. She presents to the clinic today noting she is doing fine. She is glad her Taxol is halfway through. She has been tolerating it but she is starting to feel it more. She started to feel numbness in her fingertips and her toes slightly. She still is able to pick up items but will have to remember her current dexterity. She almost dropped her grandson. She notes the left side of her tongue will hurt, she has a small sore that is healing. The blackness of her tongue is improving from previous chemotherapy. She would like to continue with treatment.  She notes 5 days ago she slept all day and she felt sluggish for two days later. She takes naps. She does try to move around still. She notes her grandson will stay with her most of the day. She still has cough with wet/damp weather. She requested a refill her for cough medication. She has been taking her prescription medication but has not been taking calcium, vitamin D and aspirin.      MEDICAL HISTORY:  Past Medical History:  Diagnosis Date  . Anxiety   .  Arthritis   . Cancer ([place]) 10/08/2016   left breast cancer  . Diabetes mellitus without complication ([place])   . Dyslipidemia   . Family history of breast cancer   . Family history of ovarian cancer   . Family history of prostate cancer   . GERD (gastroesophageal reflux disease)    nexium if needed  . Heart murmur   . Hypertension   . Hypothyroid   . Osteopenia   . Pneumonia 10/08/2016   June 2013    SURGICAL HISTORY: Past Surgical History:  Procedure Laterality Date  . ABDOMINAL HYSTERECTOMY    . BREAST RECONSTRUCTION WITH PLACEMENT OF TISSUE EXPANDER AND FLEX HD (ACELLULAR HYDRATED DERMIS) Left 10/13/2016   Procedure: LEFT BREAST RECONSTRUCTION WITH PLACEMENT OF TISSUE EXPANDER AND ALLODERM;  Surgeon: Irene Limbo, MD;  Location:  [facility];  Service: Plastics;  Laterality: Left;  . FOOT SURGERY     bi-lat/ repaired hammer toes  . MASTECTOMY W/ SENTINEL NODE BIOPSY Left 10/13/2016  . MASTECTOMY W/ SENTINEL NODE BIOPSY Left 10/13/2016   Procedure: LEFT TOTAL MASTECTOMY WITH LEFT AXILLARY SENTINEL LYMPH NODE BIOPSY;  Surgeon: Coralie Keens, MD;  Location: [facility];  Service: General;  Laterality: Left;  . PORTACATH PLACEMENT Right 11/24/2016   Procedure: INSERTION PORT-A-CATH;  Surgeon: Fanny Skates, MD;  Location: [facility];  Service: General;  Laterality: Right;  . TUBAL LIGATION    . VIDEO ASSISTED THORACOSCOPY (VATS)/WEDGE RESECTION Left 02/25/2015   Procedure: VIDEO ASSISTED THORACOSCOPY (VATS)/ LUL WEDGE RESECTION with ON Q placement.;  Surgeon: Melrose Nakayama, MD;  Location: [facility];  Service: Thoracic;  Laterality: Left;    SOCIAL HISTORY: Social History   Socioeconomic History  . Marital status: Single    Spouse name: Not on file  . Number of children: 2  . Years of education: Not on file  . Highest education level: Not on file  Social Needs  . Financial resource strain: Not on file  . Food insecurity - worry: Not on file  . Food insecurity - inability: Not on file  . Transportation needs - medical: Not on file  . Transportation needs - non-medical: Not on file  Occupational History  . Not on file  Tobacco Use  . Smoking status: Former Smoker    Packs/day: 1.00    Years: 42.00    Pack years: 42.00    Last attempt to quit: 08/27/2016    Years since quitting: 0.5  . Smokeless tobacco: Never Used  . Tobacco comment: 5/1/18she smokes occasionally when stressed. She is smoking 4 cigarettes a week  Substance and Sexual Activity  . Alcohol use: No    Alcohol/week: 0.0 oz  . Drug use: No  . Sexual activity: Not on file  Other Topics Concern  . Not on file  Social History Narrative  . Not on file    FAMILY HISTORY: Family History  Problem Relation Age of Onset  . Cancer  Mother        brain, ovarian  . Hypertension Mother   . Stroke Father   . Hypertension Father   . Cancer Sister 19       bilateral breast cancer, 2nd cancer at 52  . Cancer Brother        dx with prostate cancer in the 27s  . Cancer Brother        dx in his 25s with prostate cancer  . Brain cancer Maternal Uncle   . Cirrhosis Brother   . Cancer  Brother        NOS  . Cancer Paternal Aunt        NOS  . Cancer Cousin        paternal cousin with cancer NOS    ALLERGIES:  is allergic to vicodin [hydrocodone-acetaminophen].  MEDICATIONS:  Current Outpatient Medications  Medication Sig Dispense Refill  . aspirin 81 MG tablet Take 81 mg by mouth daily.    Marland Kitchen atorvastatin (LIPITOR) 40 MG tablet Take 40 mg by mouth daily.    . ergocalciferol (VITAMIN D2) 50000 UNITS capsule Take 50,000 Units by mouth every Friday.     Marland Kitchen ibuprofen (ADVIL,MOTRIN) 600 MG tablet Take 1 tablet (600 mg total) by mouth every 8 (eight) hours as needed. 15 tablet 0  . levothyroxine (SYNTHROID, LEVOTHROID) 88 MCG tablet Take 88 mcg by mouth daily before breakfast.     . lidocaine-prilocaine (EMLA) cream Apply to affected area once 30 g 3  . metFORMIN (GLUCOPHAGE) 500 MG tablet Take 1 tablet (500 mg total) 2 (two) times daily with a meal by mouth. 60 tablet 2  . olmesartan (BENICAR) 20 MG tablet Take 20 mg by mouth daily.    . calcium-vitamin D 250-100 MG-UNIT tablet Take 2 tablets by mouth daily.     Marland Kitchen guaiFENesin-codeine 100-10 MG/5ML syrup Take 5 mLs by mouth every 6 (six) hours as needed for cough. (Patient not taking: Reported on 03/03/2017) 120 mL 0  . HYDROcodone-acetaminophen (NORCO) 5-325 MG tablet Take 1-2 tablets by mouth every 6 (six) hours as needed for moderate pain or severe pain. (Patient not taking: Reported on 02/17/2017) 20 tablet 0  . HYDROcodone-homatropine (HYCODAN) 5-1.5 MG/5ML syrup Take 5 mLs by mouth every 6 (six) hours as needed for cough. 120 mL 0  . methocarbamol (ROBAXIN) 500 MG tablet  Take 1 tablet (500 mg total) by mouth every 8 (eight) hours as needed for muscle spasms. (Patient not taking: Reported on 03/03/2017) 30 tablet 0  . ondansetron (ZOFRAN) 8 MG tablet Take 1 tablet (8 mg total) by mouth 2 (two) times daily as needed. Start on the third day after chemotherapy. (Patient not taking: Reported on 02/17/2017) 30 tablet 1  . prochlorperazine (COMPAZINE) 10 MG tablet Take 1 tablet (10 mg total) by mouth every 6 (six) hours as needed (Nausea or vomiting). (Patient not taking: Reported on 02/17/2017) 30 tablet 1  . traMADol (ULTRAM) 50 MG tablet Take 50 mg by mouth every 6 (six) hours as needed for moderate pain or severe pain.      No current facility-administered medications for this visit.     REVIEW OF SYSTEMS:  Constitutional: Denies fevers, chills (+) significant fatigue, low energy  Eyes: Denies blurriness of vision, double vision or watery eyes. Denies eye pain, redness, or itching  Ears, nose, mouth, throat, and face: Denies mucositis or nasal congestion (+) healing sore on tongue and improving blackness of tongue Respiratory: (+) occasional cough  Cardiovascular: Denies palpitation, chest discomfort or lower extremity swelling Gastrointestinal:  Denies vomiting, constipation, diarrhea, or change in bowel habits  GU/GYN: recurrent herpes outbreak, improving, some residual sensitivity Skin: Denies abnormal skin rashes Lymphatics: Denies new lymphadenopathy or easy bruising Musculoskeletal: (+) arthritis in the right knee.  Neurological:Denies new weaknesses (+) mild numbness in her fingertips more than toes  Breast: negative  Behavioral/Psych: Mood is stable, no new changes  All other systems were reviewed with the patient and are negative.  PHYSICAL EXAMINATION: ECOG PERFORMANCE STATUS: 1  Vitals:   03/03/17 2505  BP: 99/71  Pulse: (!) 110  Resp: 18  Temp: 100 F (37.8 C)   Filed Weights   03/03/17 0917  Weight: 179 lb 1.6 oz (81.2 kg)      GENERAL:alert, no distress and comfortable SKIN: skin color, texture, turgor are normal, no rashes  EYES: normal, conjunctiva are pink and non-injected, sclera clear OROPHARYNX:no exudate, no erythema and lips, buccal mucosa, and tongue normal . No obvious mucositis NECK: supple, thyroid normal size, non-tender, without nodularity LYMPH:  no palpable  Cervical, supraclavicular, or axillary lymphadenopathy LUNGS: clear to auscultation bilaterally with normal breathing effort HEART: regular rate & rhythm and no murmurs and no lower extremity edema ABDOMEN:abdomen soft, non-tender and normal bowel sounds. No palpable hepatomegaly or masses GU/GYN: external exam reveals healing herpetic lesions to left labia minora Musculoskeletal:no cyanosis of digits and no clubbing  PSYCH: alert & oriented x 3 with fluent speech NEURO: no focal motor (+) slight sensory deficit in fingers, L>R Breasts: Breast inspection showed them to be symmetrical with no nipple discharge. (+) S/p left mastectomy with tissue expander placement with incision well-healed, no tenderness.   LABORATORY DATA:  I have reviewed the data as listed CBC Latest Ref Rng & Units 03/03/2017 02/24/2017 02/17/2017  WBC 3.9 - 10.3 10e3/uL 1.8(L) 2.3(L) 3.4(L)  Hemoglobin 11.6 - 15.9 g/dL 10.3(L) 10.4(L) 9.5(L)  Hematocrit 34.8 - 46.6 % 31.0(L) 32.5(L) 29.4(L)  Platelets 145 - 400 10e3/uL 255 263 245    CMP Latest Ref Rng & Units 03/03/2017 02/24/2017 02/17/2017  Glucose 70 - 140 mg/dl 106 98 137  BUN 7.0 - 26.0 mg/dL 6.0(L) 5.0(L) 5.4(L)  Creatinine 0.6 - 1.1 mg/dL 0.7 0.7 0.6  Sodium 136 - 145 mEq/L 138 139 137  Potassium 3.5 - 5.1 mEq/L 4.1 4.2 3.9  Chloride 101 - 111 mmol/L - - -  CO2 22 - 29 mEq/L _0 Calcium 8.4 - 10.4 mg/dL 9.5 9.4 9.3  Total Protein 6.4 - 8.3 g/dL 6.8 7.0 6.5  Total Bilirubin 0.20 - 1.20 mg/dL 0.80 0.74 0.72  Alkaline Phos 40 - 150 U/L 58 56 51  AST 5 - 34 U/L _1 ALT 0 - 55 U/L _2 PATHOLOGY:   Surgery Diagnosis 10/13/16  1. Lymph node, sentinel, biopsy, Left Axillary - METASTATIC CARCINOMA IN ONE LYMPH NODE (1/1). 2. Lymph node, sentinel, biopsy, Left - METASTATIC CARCINOMA IN ONE LYMPH NODE (1/1). 3. Breast, radical mastectomy (including lymph nodes), Left - INVASIVE DUCTAL CARCINOMA, 0.3 CM. - LYMPHOVASCULAR INVOLVEMENT BY CARCINOMA. - HIGH GRADE DUCTAL CARCINOMA IN SITU WITH CALCIFICATIONS AND NECROSIS, 5.5 CM. - DCIS FOCALLY 0.1 CM FROM CAUTERIZED MEDIAL MARGIN. - SEE ONCOLOGY TABLE AND COMMENT. 4. Skin , Left additional mastectomy flap - BENIGN FIBROADIPOSE TISSUE. - NO EVIDENCE OF MALIGNANCY. Microscopic Comment 3. BREAST, INVASIVE TUMOR Procedure: Mastectomy and two sentinel lymph nodes with additional left mastectomy flap. Laterality: Left breast Tumor Size: 0.3 cm invasive carcinoma; 5.5 cm DCIS Histologic Type: Ductal Grade: II Tubular Differentiation: 2 Nuclear Pleomorphism: 2 Mitotic Count: 2 Ductal Carcinoma in Situ (DCIS): Present, high grade with necrosis Extent of Tumor: Skin: Free of tumor Nipple: Free of tumor Microscopic Comment(continued) Skeletal muscle: Free of tumor Margins: Free of tumor Invasive carcinoma, distance from closest margin: Greater than 1.5 cm DCIS, distance from closest margin: Less than 0.1 cm from medial margin Regional Lymph Nodes: Number of Lymph Nodes Examined: 2 Number of Sentinel Lymph Nodes Examined: 2 Lymph Nodes with  Macrometastases: 2 Lymph Nodes with Micrometastases: 0 Lymph Nodes with Isolated Tumor Cells: 0 Breast Prognostic Profile: Pending Estrogen Receptor: Pending Progesterone Receptor: Pending Her2: Pending Ki-67: Pending Best tumor block for sendout testing: [facility] Pathologic Stage Classification (pTNM, AJCC 8th Edition): Primary Tumor (pT): pT1a Regional Lymph Nodes (pN): pN1a Distant Metastases (pM): pMX Comments: There is extensive high grade ductal carcinoma in situ with necrosis.  Multiple additional portions of the tissue are examined and there is a 0.3 cm invasive ductal carcinoma associated with lymphovascular involvement by tumor. Breast prognostic profile will be performed on the invasive carcinoma and reported as an addendum. Immunohistochemistry shows positive basal cell staining in the DCIS with calponin, smooth muscle myosin and p63. ADDITIONAL INFORMATION: 3. By immunohistochemistry, the tumor cells are Negative for Her2 (1+). Vicente Males MD Pathologist, Electronic Signature ( Signed 10/21/2016) 3. PROGNOSTIC INDICATORS For Part 3[facility] Results: IMMUNOHISTOCHEMICAL AND MORPHOMETRIC ANALYSIS PERFORMED MANUALLY Estrogen Receptor: 95%, POSITIVE, STRONG STAINING INTENSITY Progesterone Receptor: 45%, POSITIVE, STRONG STAINING INTENSITY Proliferation Marker Ki67: 15% ADDITIONAL INFORMATION:(continued) Results: HER2 - *EQUIVOCAL*. OF NOTE, A TOTAL OF 40 TUMOR CELLS WERE EVALUATED FOR HER2 EXPRESSION. HER2 BY IMMUNOHISTOCHEMISTRY WILL BE PERFORMED AND THE RESULTS REPORTED SEPARATELY. RATIO OF HER2/CEP17 SIGNALS 1.67 AVERAGE HER2 COPY NUMBER PER CELL 4.18   Surgery: 07/10/16 Diagnosis 1. 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 - SEE MICROSCOPIC DESCRIPTION. 2. 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 - SEE MICROSCOPIC DESCRIPTION. Microscopic Comment 1. Prognostic markers have been ordered and will be reported in an addendum. 2. Prognostic markers have been ordered and will be reported in an addendum. Called to the [facility] on 07/13/16. Results: IMMUNOHISTOCHEMICAL AND MORPHOMETRIC ANALYSIS PERFORMED MANUALLY Estrogen Receptor: 95%, POSITIVE, STRONG STAINING INTENSITY Progesterone Receptor: 80%, POSITIVE, STRONG  STAINING INTENSITY  Biopsy: 02/25/15 Diagnosis 1. 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 - NO ATYPIA OR MALIGNANCY IDENTIFIED. - SEE COMMENT. 2. Lymph node, biopsy, 12 L - ONE BENIGN LYMPH NODE WITH NO TUMOR SEEN (0/1). 3. Lymph node, biopsy, 9 L - ONE BENIGN LYMPH NODE WITH NO TUMOR SEEN (0/1). Microscopic Comment 1. A Congo Red stain is performed and a crystal violet stain is performed on the lung specimen. The staining pattern (apple green birefringence) coupled with the morphology is consistent with amyloid deposition. Dr. Zenda Alpers, Dr. Fletcher Anon and Dr. Darl Householder have all seen this case in consultation with agreement that amyloid deposition is present. Dr. Donato Heinz has also seen this case in consultation with agreement of the additional findings in the case.   MammaPrint 10/31/26 Result:  High Risk at 94.6% MPI: -0.368 Average 10-year risk of recurrence untreated: 29%   Genetics 07/29/16    PROCEDURES  ECHO 11/17/16 Study Conclusions - Left ventricle: The cavity size was normal. Wall thickness was   normal. Systolic function was normal. The estimated ejection   fraction was in the range of 60% to 65%. Wall motion was normal;   there were no regional wall motion abnormalities. Features are   consistent with a pseudonormal left ventricular filling pattern,   with concomitant abnormal relaxation and increased filling   pressure (grade 2 diastolic dysfunction). - Aortic valve: There was mild regurgitation. Valve area (VTI):   1.84 cm^2. Valve area (  Vmax): 1.76 cm^2. Valve area (Vmean): 1.89   cm^2.    RADIOGRAPHIC STUDIES: I have personally reviewed the radiological images as listed and agreed with the findings in the report. No results found.  ASSESSMENT & PLAN:  Taylor Walsh is 61 y.o. who present to the clinic with Ductal carcinoma in situ (DCIS) of  left breast  1. 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 - She had mostly DCIS in her breast but There was small component of invasive cancer that has spread to 2 of her lymph nodes.  Her surgical margins were negative. -Her mammaprint showed high-risk disease, luminal type B -Her staging scan was negative for distant metastasis, or residual axillary adenopathy. -She underwent adjuvant dose dense AC every 2 weeks for 4 cycles from 11/25/2016 to 01/05/2017, she tolerated well overall but had neutropenia and required IV hydration -She started weekly Taxol for 12 weeks on 01/20/2017 and plan to complete on 04/07/16. The course of therapy is curative.  -She plans to do breast reconstruction after Taxol and radiation -She has been tolerating Taxol well, will continue.  She has developed neutropenia, mild neuropathy and hyperpigmentation to her hands and feet. I suggest continue salt wash and mouth wash daily.  -Labs reviewed, Her WBC has dropped to 1.8 and ANC is 1.1. Her Hg is 10.3 but does not need a blood transfusion. I will give her a Granix injection this week to help her blood counts and proceed with Granix on Day 2 for future treatments. Her BUN has improved to 6.0. Overall labs adequate to proceed with Taxol today.  -I suggest she take 1-2 tabs of Calcium and at least 1 tab of Vitamin D daily. She is fine to continue Aspirin. I suggest she hold her HTN medication for now. If her BP increases to 140 or above she will restart.  -F/u on 12/19   2. Arthritis -She experiences mild arthritis in LE, more specific to her right knee -She is pretty active   3. Osteopenia  -Last bone density scan ordered by Dr Brigitte Pulse in the last 2 years -Will f/u with PCP and  scans every 2 years  4. Smoking cessation -patient stopped smoking but after close friend passing she has started again smoking.  -She smokes approximately 1 pack a week.  -The patient quit smoking in 07/2016 -She does have cough that persist depending on the weather. I refilled her Hycodan today (03/03/17)  5. 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 The Hereditary Gene Panel offered by [facility] includes sequencing and/or deletion duplication testing of the following 46 genes: APC, ATM, AXIN2, BARD1, BMPR1A, BRCA1, BRCA2, BRIP1, CDH1, CDKN2A (p14ARF), CDKN2A (p16INK4a), CHEK2, CTNNA1, DICER1, EPCAM (Deletion/duplication testing only), GREM1 (promoter region deletion/duplication testing only), KIT, MEN1, MLH1, MSH2, MSH3, MSH6, MUTYH, NBN, NF1, NHTL1, PALB2, PDGFRA, PMS2, POLD1, POLE, PTEN, RAD50, RAD51C, RAD51D, SDHB, SDHC, SDHD, SMAD4, SMARCA4. STK11, TP53, TSC1, TSC2, and VHL.  The following gene was evaluated for sequence changes only: SDHA and HOXB13 c.251G>A variant only.  The report date is Aug 09, 2016.  6. 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 History of  pulmonary amyloidosis -She was found to have a lung nodule in October 2016, which was surgically removed, and showed pulmonary amyloidosis -Previous lab showed no evidence of systemic amyloidosis -Continue monitoring  8.  Neutropenia -secondary to chemotherapy -mild, will continue monitoring. -if ANCA below 1.0, will consider Granix -Her ANC has dropped to 1.1 and WBC 1.8, will start Granix with cycle 7.   9.  Therapy induced hyperglycemia -Continue metformin, random sugar has been normal -Previously discussed diabetic diet and exercise -BG controlled, normal at 106 as of 03/03/17    10. Hyperpigmentation to  hands and feet -secondary to chemotherapy -We discussed this will likely resolve slowly over time after completion of chemo.   11. Mild Neuropathy -If her neuropathy progresses I may need to reduce her taxol.     PLAN:  -Refill Hycodan today  -Hold HTN medication for now -Proceed with Taxol today and continue weekly ANC 1.1 -granix injection on 12/8 and one day after chemo from next week  -F/u in 2 weeks    All questions were answered. The patient knows to call the clinic with any problems, questions or concerns.     Truitt Merle, MD 03/04/2017   This document serves as a record of services personally performed by Truitt Merle, MD. It was created on her behalf by Joslyn Devon, a trained medical scribe. The creation of this record is based on the scribe's personal observations and the provider's statements to them.    I have reviewed the above documentation for accuracy and completeness, and I agree with the above.

## 2017-03-03 ENCOUNTER — Ambulatory Visit (HOSPITAL_BASED_OUTPATIENT_CLINIC_OR_DEPARTMENT_OTHER): Payer: BC Managed Care – PPO | Admitting: Hematology

## 2017-03-03 ENCOUNTER — Ambulatory Visit: Payer: BC Managed Care – PPO

## 2017-03-03 ENCOUNTER — Telehealth: Payer: Self-pay

## 2017-03-03 ENCOUNTER — Other Ambulatory Visit (HOSPITAL_BASED_OUTPATIENT_CLINIC_OR_DEPARTMENT_OTHER): Payer: BC Managed Care – PPO

## 2017-03-03 ENCOUNTER — Ambulatory Visit (HOSPITAL_BASED_OUTPATIENT_CLINIC_OR_DEPARTMENT_OTHER): Payer: BC Managed Care – PPO

## 2017-03-03 VITALS — BP 99/71 | HR 110 | Temp 100.0°F | Resp 18 | Ht 63.0 in | Wt 179.1 lb

## 2017-03-03 DIAGNOSIS — Z5111 Encounter for antineoplastic chemotherapy: Secondary | ICD-10-CM | POA: Diagnosis not present

## 2017-03-03 DIAGNOSIS — E854 Organ-limited amyloidosis: Secondary | ICD-10-CM

## 2017-03-03 DIAGNOSIS — Z170 Estrogen receptor positive status [ER+]: Principal | ICD-10-CM

## 2017-03-03 DIAGNOSIS — T451X5A Adverse effect of antineoplastic and immunosuppressive drugs, initial encounter: Secondary | ICD-10-CM

## 2017-03-03 DIAGNOSIS — C50212 Malignant neoplasm of upper-inner quadrant of left female breast: Secondary | ICD-10-CM

## 2017-03-03 DIAGNOSIS — D701 Agranulocytosis secondary to cancer chemotherapy: Secondary | ICD-10-CM

## 2017-03-03 DIAGNOSIS — D6481 Anemia due to antineoplastic chemotherapy: Secondary | ICD-10-CM

## 2017-03-03 DIAGNOSIS — M8580 Other specified disorders of bone density and structure, unspecified site: Secondary | ICD-10-CM | POA: Diagnosis not present

## 2017-03-03 DIAGNOSIS — Z95828 Presence of other vascular implants and grafts: Secondary | ICD-10-CM

## 2017-03-03 LAB — CBC WITH DIFFERENTIAL/PLATELET
BASO%: 0.8 % (ref 0.0–2.0)
Basophils Absolute: 0 10*3/uL (ref 0.0–0.1)
EOS%: 1 % (ref 0.0–7.0)
Eosinophils Absolute: 0 10*3/uL (ref 0.0–0.5)
HCT: 31 % — ABNORMAL LOW (ref 34.8–46.6)
HEMOGLOBIN: 10.3 g/dL — AB (ref 11.6–15.9)
LYMPH%: 24.3 % (ref 14.0–49.7)
MCH: 30.1 pg (ref 25.1–34.0)
MCHC: 33.1 g/dL (ref 31.5–36.0)
MCV: 91 fL (ref 79.5–101.0)
MONO#: 0.2 10*3/uL (ref 0.1–0.9)
MONO%: 12.5 % (ref 0.0–14.0)
NEUT%: 61.4 % (ref 38.4–76.8)
NEUTROS ABS: 1.1 10*3/uL — AB (ref 1.5–6.5)
Platelets: 255 10*3/uL (ref 145–400)
RBC: 3.41 10*6/uL — ABNORMAL LOW (ref 3.70–5.45)
RDW: 23.5 % — ABNORMAL HIGH (ref 11.2–14.5)
WBC: 1.8 10*3/uL — AB (ref 3.9–10.3)
lymph#: 0.4 10*3/uL — ABNORMAL LOW (ref 0.9–3.3)

## 2017-03-03 LAB — COMPREHENSIVE METABOLIC PANEL
ALT: 19 U/L (ref 0–55)
ANION GAP: 9 meq/L (ref 3–11)
AST: 19 U/L (ref 5–34)
Albumin: 3.7 g/dL (ref 3.5–5.0)
Alkaline Phosphatase: 58 U/L (ref 40–150)
BILIRUBIN TOTAL: 0.8 mg/dL (ref 0.20–1.20)
BUN: 6 mg/dL — ABNORMAL LOW (ref 7.0–26.0)
CALCIUM: 9.5 mg/dL (ref 8.4–10.4)
CO2: 24 mEq/L (ref 22–29)
CREATININE: 0.7 mg/dL (ref 0.6–1.1)
Chloride: 105 mEq/L (ref 98–109)
Glucose: 106 mg/dl (ref 70–140)
Potassium: 4.1 mEq/L (ref 3.5–5.1)
Sodium: 138 mEq/L (ref 136–145)
TOTAL PROTEIN: 6.8 g/dL (ref 6.4–8.3)

## 2017-03-03 MED ORDER — SODIUM CHLORIDE 0.9 % IV SOLN
80.0000 mg/m2 | Freq: Once | INTRAVENOUS | Status: AC
  Administered 2017-03-03: 162 mg via INTRAVENOUS
  Filled 2017-03-03: qty 27

## 2017-03-03 MED ORDER — DEXAMETHASONE SODIUM PHOSPHATE 10 MG/ML IJ SOLN
INTRAMUSCULAR | Status: AC
  Filled 2017-03-03: qty 1

## 2017-03-03 MED ORDER — HEPARIN SOD (PORK) LOCK FLUSH 100 UNIT/ML IV SOLN
500.0000 [IU] | Freq: Once | INTRAVENOUS | Status: AC | PRN
  Administered 2017-03-03: 500 [IU]
  Filled 2017-03-03: qty 5

## 2017-03-03 MED ORDER — HYDROCODONE-HOMATROPINE 5-1.5 MG/5ML PO SYRP: 5.0000 mL | ORAL_SOLUTION | Freq: Four times a day (QID) | ORAL | 0 refills | Status: DC | PRN

## 2017-03-03 MED ORDER — FAMOTIDINE IN NACL 20-0.9 MG/50ML-% IV SOLN
20.0000 mg | Freq: Once | INTRAVENOUS | Status: AC
  Administered 2017-03-03: 20 mg via INTRAVENOUS

## 2017-03-03 MED ORDER — DIPHENHYDRAMINE HCL 50 MG/ML IJ SOLN
INTRAMUSCULAR | Status: AC
  Filled 2017-03-03: qty 1

## 2017-03-03 MED ORDER — SODIUM CHLORIDE 0.9% FLUSH
10.0000 mL | Freq: Once | INTRAVENOUS | Status: AC
  Administered 2017-03-03: 10 mL
  Filled 2017-03-03: qty 10

## 2017-03-03 MED ORDER — FAMOTIDINE IN NACL 20-0.9 MG/50ML-% IV SOLN
INTRAVENOUS | Status: AC
  Filled 2017-03-03: qty 50

## 2017-03-03 MED ORDER — DIPHENHYDRAMINE HCL 50 MG/ML IJ SOLN
25.0000 mg | Freq: Once | INTRAMUSCULAR | Status: AC
  Administered 2017-03-03: 25 mg via INTRAVENOUS

## 2017-03-03 MED ORDER — SODIUM CHLORIDE 0.9% FLUSH
10.0000 mL | INTRAVENOUS | Status: DC | PRN
  Administered 2017-03-03: 10 mL
  Filled 2017-03-03: qty 10

## 2017-03-03 MED ORDER — DEXAMETHASONE SODIUM PHOSPHATE 10 MG/ML IJ SOLN
10.0000 mg | Freq: Once | INTRAMUSCULAR | Status: AC
  Administered 2017-03-03: 10 mg via INTRAVENOUS

## 2017-03-03 MED ORDER — SODIUM CHLORIDE 0.9 % IV SOLN
Freq: Once | INTRAVENOUS | Status: AC
  Administered 2017-03-03: 10:00:00 via INTRAVENOUS

## 2017-03-03 MED ORDER — HYDROCODONE-ACETAMINOPHEN 7.5-325 MG/15ML PO SOLN: 10.0000 mL | Freq: Four times a day (QID) | ORAL | 0 refills | Status: DC | PRN

## 2017-03-03 NOTE — Patient Instructions (Signed)
[facility] Discharge Instructions for Patients Receiving Chemotherapy  Today you received the following chemotherapy agents:  Taxol.  To help prevent nausea and vomiting after your treatment, we encourage you to take your nausea medication as directed.   If you develop nausea and vomiting that is not controlled by your nausea medication, call the clinic.   BELOW ARE SYMPTOMS THAT SHOULD BE REPORTED IMMEDIATELY:  *FEVER GREATER THAN 100.5 F  *CHILLS WITH OR WITHOUT FEVER  NAUSEA AND VOMITING THAT IS NOT CONTROLLED WITH YOUR NAUSEA MEDICATION  *UNUSUAL SHORTNESS OF BREATH  *UNUSUAL BRUISING OR BLEEDING  TENDERNESS IN MOUTH AND THROAT WITH OR WITHOUT PRESENCE OF ULCERS  *URINARY PROBLEMS  *BOWEL PROBLEMS  UNUSUAL RASH Items with * indicate a potential emergency and should be followed up as soon as possible.  Feel free to call the clinic should you have any questions or concerns. The clinic phone number is [phone].  Please show the CHEMO ALERT CARD at check-in to the Emergency Department and triage nurse.   

## 2017-03-03 NOTE — Progress Notes (Signed)
Per Dr. Burr Medico okay to treat with heart rate of 110 and ANC of 1.1.

## 2017-03-03 NOTE — Telephone Encounter (Signed)
Printed avs and calender for up coming appointment. Per 12/5 los 

## 2017-03-04 ENCOUNTER — Encounter: Payer: Self-pay | Admitting: Hematology

## 2017-03-06 ENCOUNTER — Ambulatory Visit (HOSPITAL_BASED_OUTPATIENT_CLINIC_OR_DEPARTMENT_OTHER): Payer: BC Managed Care – PPO

## 2017-03-06 VITALS — BP 128/78 | HR 91 | Temp 98.1°F | Resp 18

## 2017-03-06 DIAGNOSIS — D701 Agranulocytosis secondary to cancer chemotherapy: Secondary | ICD-10-CM | POA: Diagnosis not present

## 2017-03-06 DIAGNOSIS — C50212 Malignant neoplasm of upper-inner quadrant of left female breast: Secondary | ICD-10-CM

## 2017-03-06 DIAGNOSIS — Z170 Estrogen receptor positive status [ER+]: Secondary | ICD-10-CM

## 2017-03-06 DIAGNOSIS — Z95828 Presence of other vascular implants and grafts: Secondary | ICD-10-CM

## 2017-03-06 MED ORDER — TBO-FILGRASTIM 480 MCG/0.8ML ~~LOC~~ SOSY
480.0000 ug | PREFILLED_SYRINGE | Freq: Once | SUBCUTANEOUS | Status: AC
  Administered 2017-03-06: 480 ug via SUBCUTANEOUS

## 2017-03-06 MED ORDER — TBO-FILGRASTIM 480 MCG/0.8ML ~~LOC~~ SOSY
PREFILLED_SYRINGE | SUBCUTANEOUS | Status: AC
  Filled 2017-03-06: qty 0.8

## 2017-03-06 NOTE — Patient Instructions (Signed)
Tbo-Filgrastim injection What is this medicine? TBO-FILGRASTIM (T B O fil GRA stim) is a granulocyte colony-stimulating factor that stimulates the growth of neutrophils, a type of white blood cell important in the body's fight against infection. It is used to reduce the incidence of fever and infection in patients with certain types of cancer who are receiving chemotherapy that affects the bone marrow. This medicine may be used for other purposes; ask your health care provider or pharmacist if you have questions. COMMON BRAND NAME(S): Granix What should I tell my health care provider before I take this medicine? They need to know if you have any of these conditions: -bone scan or tests planned -kidney disease -sickle cell anemia -an unusual or allergic reaction to tbo-filgrastim, filgrastim, pegfilgrastim, other medicines, foods, dyes, or preservatives -pregnant or trying to get pregnant -breast-feeding How should I use this medicine? This medicine is for injection under the skin. If you get this medicine at home, you will be taught how to prepare and give this medicine. Refer to the Instructions for Use that come with your medication packaging. Use exactly as directed. Take your medicine at regular intervals. Do not take your medicine more often than directed. It is important that you put your used needles and syringes in a special sharps container. Do not put them in a trash can. If you do not have a sharps container, call your pharmacist or healthcare provider to get one. Talk to your pediatrician regarding the use of this medicine in children. Special care may be needed. Overdosage: If you think you have taken too much of this medicine contact a poison control center or emergency room at once. NOTE: This medicine is only for you. Do not share this medicine with others. What if I miss a dose? It is important not to miss your dose. Call your doctor or health care professional if you miss a  dose. What may interact with this medicine? This medicine may interact with the following medications: -medicines that may cause a release of neutrophils, such as lithium This list may not describe all possible interactions. Give your health care provider a list of all the medicines, herbs, non-prescription drugs, or dietary supplements you use. Also tell them if you smoke, drink alcohol, or use illegal drugs. Some items may interact with your medicine. What should I watch for while using this medicine? You may need blood work done while you are taking this medicine. What side effects may I notice from receiving this medicine? Side effects that you should report to your doctor or health care professional as soon as possible: -allergic reactions like skin rash, itching or hives, swelling of the face, lips, or tongue -blood in the urine -dark urine -dizziness -fast heartbeat -feeling faint -shortness of breath or breathing problems -signs and symptoms of infection like fever or chills; cough; or sore throat -signs and symptoms of kidney injury like trouble passing urine or change in the amount of urine -stomach or side pain, or pain at the shoulder -sweating -swelling of the legs, ankles, or abdomen -tiredness Side effects that usually do not require medical attention (report to your doctor or health care professional if they continue or are bothersome): -bone pain -headache -muscle pain -vomiting This list may not describe all possible side effects. Call your doctor for medical advice about side effects. You may report side effects to FDA at 1-800-FDA-1088. Where should I keep my medicine? Keep out of the reach of children. Store in a refrigerator between   2 and 8 degrees C (36 and 46 degrees F). Keep in carton to protect from light. Throw away this medicine if it is left out of the refrigerator for more than 5 consecutive days. Throw away any unused medicine after the expiration  date. NOTE: This sheet is a summary. It may not cover all possible information. If you have questions about this medicine, talk to your doctor, pharmacist, or health care provider.  2018 [employer]vier/Gold Standard (2015-05-06 19:07:04)  

## 2017-03-10 ENCOUNTER — Ambulatory Visit: Payer: BC Managed Care – PPO

## 2017-03-10 ENCOUNTER — Ambulatory Visit (HOSPITAL_BASED_OUTPATIENT_CLINIC_OR_DEPARTMENT_OTHER): Payer: BC Managed Care – PPO

## 2017-03-10 ENCOUNTER — Other Ambulatory Visit (HOSPITAL_BASED_OUTPATIENT_CLINIC_OR_DEPARTMENT_OTHER): Payer: BC Managed Care – PPO

## 2017-03-10 VITALS — BP 131/67 | HR 80 | Temp 98.2°F | Resp 18

## 2017-03-10 DIAGNOSIS — Z5111 Encounter for antineoplastic chemotherapy: Secondary | ICD-10-CM | POA: Diagnosis not present

## 2017-03-10 DIAGNOSIS — C50212 Malignant neoplasm of upper-inner quadrant of left female breast: Secondary | ICD-10-CM

## 2017-03-10 DIAGNOSIS — Z170 Estrogen receptor positive status [ER+]: Principal | ICD-10-CM

## 2017-03-10 DIAGNOSIS — Z95828 Presence of other vascular implants and grafts: Secondary | ICD-10-CM

## 2017-03-10 LAB — CBC WITH DIFFERENTIAL/PLATELET
BASO%: 0.6 % (ref 0.0–2.0)
Basophils Absolute: 0 10*3/uL (ref 0.0–0.1)
EOS ABS: 0 10*3/uL (ref 0.0–0.5)
EOS%: 0.8 % (ref 0.0–7.0)
HEMATOCRIT: 30.7 % — AB (ref 34.8–46.6)
HGB: 10 g/dL — ABNORMAL LOW (ref 11.6–15.9)
LYMPH#: 0.7 10*3/uL — AB (ref 0.9–3.3)
LYMPH%: 19.1 % (ref 14.0–49.7)
MCH: 30 pg (ref 25.1–34.0)
MCHC: 32.6 g/dL (ref 31.5–36.0)
MCV: 92.2 fL (ref 79.5–101.0)
MONO#: 0.9 10*3/uL (ref 0.1–0.9)
MONO%: 24.1 % — ABNORMAL HIGH (ref 0.0–14.0)
NEUT#: 2 10*3/uL (ref 1.5–6.5)
NEUT%: 55.4 % (ref 38.4–76.8)
PLATELETS: 250 10*3/uL (ref 145–400)
RBC: 3.33 10*6/uL — ABNORMAL LOW (ref 3.70–5.45)
RDW: 21.5 % — ABNORMAL HIGH (ref 11.2–14.5)
WBC: 3.6 10*3/uL — ABNORMAL LOW (ref 3.9–10.3)

## 2017-03-10 LAB — COMPREHENSIVE METABOLIC PANEL
ALBUMIN: 3.8 g/dL (ref 3.5–5.0)
ALK PHOS: 67 U/L (ref 40–150)
ALT: 19 U/L (ref 0–55)
AST: 22 U/L (ref 5–34)
Anion Gap: 11 mEq/L (ref 3–11)
BUN: 6.8 mg/dL — AB (ref 7.0–26.0)
CHLORIDE: 105 meq/L (ref 98–109)
CO2: 22 meq/L (ref 22–29)
Calcium: 9.3 mg/dL (ref 8.4–10.4)
Creatinine: 0.7 mg/dL (ref 0.6–1.1)
GLUCOSE: 96 mg/dL (ref 70–140)
POTASSIUM: 3.6 meq/L (ref 3.5–5.1)
SODIUM: 138 meq/L (ref 136–145)
Total Bilirubin: 0.75 mg/dL (ref 0.20–1.20)
Total Protein: 6.8 g/dL (ref 6.4–8.3)

## 2017-03-10 MED ORDER — SODIUM CHLORIDE 0.9 % IV SOLN
Freq: Once | INTRAVENOUS | Status: AC
  Administered 2017-03-10: 13:00:00 via INTRAVENOUS

## 2017-03-10 MED ORDER — SODIUM CHLORIDE 0.9% FLUSH
10.0000 mL | Freq: Once | INTRAVENOUS | Status: AC
  Administered 2017-03-10: 10 mL
  Filled 2017-03-10: qty 10

## 2017-03-10 MED ORDER — DIPHENHYDRAMINE HCL 50 MG/ML IJ SOLN
25.0000 mg | Freq: Once | INTRAMUSCULAR | Status: AC
  Administered 2017-03-10: 25 mg via INTRAVENOUS

## 2017-03-10 MED ORDER — DEXAMETHASONE SODIUM PHOSPHATE 10 MG/ML IJ SOLN
10.0000 mg | Freq: Once | INTRAMUSCULAR | Status: AC
  Administered 2017-03-10: 10 mg via INTRAVENOUS

## 2017-03-10 MED ORDER — HEPARIN SOD (PORK) LOCK FLUSH 100 UNIT/ML IV SOLN
500.0000 [IU] | Freq: Once | INTRAVENOUS | Status: AC | PRN
  Administered 2017-03-10: 500 [IU]
  Filled 2017-03-10: qty 5

## 2017-03-10 MED ORDER — FAMOTIDINE IN NACL 20-0.9 MG/50ML-% IV SOLN
20.0000 mg | Freq: Once | INTRAVENOUS | Status: AC
  Administered 2017-03-10: 20 mg via INTRAVENOUS

## 2017-03-10 MED ORDER — SODIUM CHLORIDE 0.9% FLUSH
10.0000 mL | INTRAVENOUS | Status: DC | PRN
  Administered 2017-03-10: 10 mL
  Filled 2017-03-10: qty 10

## 2017-03-10 MED ORDER — SODIUM CHLORIDE 0.9 % IV SOLN
80.0000 mg/m2 | Freq: Once | INTRAVENOUS | Status: AC
  Administered 2017-03-10: 162 mg via INTRAVENOUS
  Filled 2017-03-10: qty 27

## 2017-03-10 NOTE — Patient Instructions (Signed)
[facility] Discharge Instructions for Patients Receiving Chemotherapy  Today you received the following chemotherapy agents:  Taxol.  To help prevent nausea and vomiting after your treatment, we encourage you to take your nausea medication as directed.   If you develop nausea and vomiting that is not controlled by your nausea medication, call the clinic.   BELOW ARE SYMPTOMS THAT SHOULD BE REPORTED IMMEDIATELY:  *FEVER GREATER THAN 100.5 F  *CHILLS WITH OR WITHOUT FEVER  NAUSEA AND VOMITING THAT IS NOT CONTROLLED WITH YOUR NAUSEA MEDICATION  *UNUSUAL SHORTNESS OF BREATH  *UNUSUAL BRUISING OR BLEEDING  TENDERNESS IN MOUTH AND THROAT WITH OR WITHOUT PRESENCE OF ULCERS  *URINARY PROBLEMS  *BOWEL PROBLEMS  UNUSUAL RASH Items with * indicate a potential emergency and should be followed up as soon as possible.  Feel free to call the clinic should you have any questions or concerns. The clinic phone number is [phone].  Please show the CHEMO ALERT CARD at check-in to the Emergency Department and triage nurse.   

## 2017-03-11 ENCOUNTER — Ambulatory Visit (HOSPITAL_BASED_OUTPATIENT_CLINIC_OR_DEPARTMENT_OTHER): Payer: BC Managed Care – PPO

## 2017-03-11 VITALS — BP 123/49 | HR 95 | Temp 99.0°F | Resp 20

## 2017-03-11 DIAGNOSIS — D701 Agranulocytosis secondary to cancer chemotherapy: Secondary | ICD-10-CM

## 2017-03-11 DIAGNOSIS — Z95828 Presence of other vascular implants and grafts: Secondary | ICD-10-CM

## 2017-03-11 DIAGNOSIS — C50212 Malignant neoplasm of upper-inner quadrant of left female breast: Secondary | ICD-10-CM

## 2017-03-11 DIAGNOSIS — Z170 Estrogen receptor positive status [ER+]: Secondary | ICD-10-CM

## 2017-03-11 MED ORDER — TBO-FILGRASTIM 480 MCG/0.8ML ~~LOC~~ SOSY
PREFILLED_SYRINGE | SUBCUTANEOUS | Status: AC
  Filled 2017-03-11: qty 0.8

## 2017-03-11 MED ORDER — TBO-FILGRASTIM 480 MCG/0.8ML ~~LOC~~ SOSY
480.0000 ug | PREFILLED_SYRINGE | Freq: Once | SUBCUTANEOUS | Status: AC
  Administered 2017-03-11: 480 ug via SUBCUTANEOUS

## 2017-03-11 NOTE — Patient Instructions (Signed)
Tbo-Filgrastim injection What is this medicine? TBO-FILGRASTIM (T B O fil GRA stim) is a granulocyte colony-stimulating factor that stimulates the growth of neutrophils, a type of white blood cell important in the body's fight against infection. It is used to reduce the incidence of fever and infection in patients with certain types of cancer who are receiving chemotherapy that affects the bone marrow. This medicine may be used for other purposes; ask your health care provider or pharmacist if you have questions. COMMON BRAND NAME(S): Granix What should I tell my health care provider before I take this medicine? They need to know if you have any of these conditions: -bone scan or tests planned -kidney disease -sickle cell anemia -an unusual or allergic reaction to tbo-filgrastim, filgrastim, pegfilgrastim, other medicines, foods, dyes, or preservatives -pregnant or trying to get pregnant -breast-feeding How should I use this medicine? This medicine is for injection under the skin. If you get this medicine at home, you will be taught how to prepare and give this medicine. Refer to the Instructions for Use that come with your medication packaging. Use exactly as directed. Take your medicine at regular intervals. Do not take your medicine more often than directed. It is important that you put your used needles and syringes in a special sharps container. Do not put them in a trash can. If you do not have a sharps container, call your pharmacist or healthcare provider to get one. Talk to your pediatrician regarding the use of this medicine in children. Special care may be needed. Overdosage: If you think you have taken too much of this medicine contact a poison control center or emergency room at once. NOTE: This medicine is only for you. Do not share this medicine with others. What if I miss a dose? It is important not to miss your dose. Call your doctor or health care professional if you miss a  dose. What may interact with this medicine? This medicine may interact with the following medications: -medicines that may cause a release of neutrophils, such as lithium This list may not describe all possible interactions. Give your health care provider a list of all the medicines, herbs, non-prescription drugs, or dietary supplements you use. Also tell them if you smoke, drink alcohol, or use illegal drugs. Some items may interact with your medicine. What should I watch for while using this medicine? You may need blood work done while you are taking this medicine. What side effects may I notice from receiving this medicine? Side effects that you should report to your doctor or health care professional as soon as possible: -allergic reactions like skin rash, itching or hives, swelling of the face, lips, or tongue -blood in the urine -dark urine -dizziness -fast heartbeat -feeling faint -shortness of breath or breathing problems -signs and symptoms of infection like fever or chills; cough; or sore throat -signs and symptoms of kidney injury like trouble passing urine or change in the amount of urine -stomach or side pain, or pain at the shoulder -sweating -swelling of the legs, ankles, or abdomen -tiredness Side effects that usually do not require medical attention (report to your doctor or health care professional if they continue or are bothersome): -bone pain -headache -muscle pain -vomiting This list may not describe all possible side effects. Call your doctor for medical advice about side effects. You may report side effects to FDA at 1-800-FDA-1088. Where should I keep my medicine? Keep out of the reach of children. Store in a refrigerator between   2 and 8 degrees C (36 and 46 degrees F). Keep in carton to protect from light. Throw away this medicine if it is left out of the refrigerator for more than 5 consecutive days. Throw away any unused medicine after the expiration  date. NOTE: This sheet is a summary. It may not cover all possible information. If you have questions about this medicine, talk to your doctor, pharmacist, or health care provider.  2018 [employer]vier/Gold Standard (2015-05-06 19:07:04)  

## 2017-03-17 ENCOUNTER — Ambulatory Visit (HOSPITAL_BASED_OUTPATIENT_CLINIC_OR_DEPARTMENT_OTHER): Payer: BC Managed Care – PPO | Admitting: Nurse Practitioner

## 2017-03-17 ENCOUNTER — Encounter: Payer: Self-pay | Admitting: Nurse Practitioner

## 2017-03-17 ENCOUNTER — Other Ambulatory Visit (HOSPITAL_BASED_OUTPATIENT_CLINIC_OR_DEPARTMENT_OTHER): Payer: BC Managed Care – PPO

## 2017-03-17 ENCOUNTER — Ambulatory Visit (HOSPITAL_BASED_OUTPATIENT_CLINIC_OR_DEPARTMENT_OTHER): Payer: BC Managed Care – PPO

## 2017-03-17 ENCOUNTER — Ambulatory Visit: Payer: BC Managed Care – PPO

## 2017-03-17 VITALS — BP 157/74 | HR 92 | Temp 98.2°F | Resp 20 | Ht 63.0 in | Wt 177.7 lb

## 2017-03-17 DIAGNOSIS — Z170 Estrogen receptor positive status [ER+]: Principal | ICD-10-CM

## 2017-03-17 DIAGNOSIS — D6481 Anemia due to antineoplastic chemotherapy: Secondary | ICD-10-CM

## 2017-03-17 DIAGNOSIS — C50212 Malignant neoplasm of upper-inner quadrant of left female breast: Secondary | ICD-10-CM

## 2017-03-17 DIAGNOSIS — Z5111 Encounter for antineoplastic chemotherapy: Secondary | ICD-10-CM

## 2017-03-17 DIAGNOSIS — C773 Secondary and unspecified malignant neoplasm of axilla and upper limb lymph nodes: Secondary | ICD-10-CM

## 2017-03-17 DIAGNOSIS — M8580 Other specified disorders of bone density and structure, unspecified site: Secondary | ICD-10-CM | POA: Diagnosis not present

## 2017-03-17 DIAGNOSIS — D709 Neutropenia, unspecified: Secondary | ICD-10-CM

## 2017-03-17 DIAGNOSIS — I10 Essential (primary) hypertension: Secondary | ICD-10-CM | POA: Diagnosis not present

## 2017-03-17 LAB — COMPREHENSIVE METABOLIC PANEL
ALK PHOS: 58 U/L (ref 40–150)
ALT: 18 U/L (ref 0–55)
AST: 19 U/L (ref 5–34)
Albumin: 3.8 g/dL (ref 3.5–5.0)
Anion Gap: 8 mEq/L (ref 3–11)
BUN: 7.4 mg/dL (ref 7.0–26.0)
CHLORIDE: 106 meq/L (ref 98–109)
CO2: 26 meq/L (ref 22–29)
Calcium: 9.1 mg/dL (ref 8.4–10.4)
Creatinine: 0.7 mg/dL (ref 0.6–1.1)
GLUCOSE: 96 mg/dL (ref 70–140)
POTASSIUM: 4.2 meq/L (ref 3.5–5.1)
SODIUM: 140 meq/L (ref 136–145)
Total Bilirubin: 1.02 mg/dL (ref 0.20–1.20)
Total Protein: 6.6 g/dL (ref 6.4–8.3)

## 2017-03-17 LAB — CBC WITH DIFFERENTIAL/PLATELET
BASO%: 1 % (ref 0.0–2.0)
BASOS ABS: 0 10*3/uL (ref 0.0–0.1)
EOS ABS: 0 10*3/uL (ref 0.0–0.5)
EOS%: 0.8 % (ref 0.0–7.0)
HCT: 30.6 % — ABNORMAL LOW (ref 34.8–46.6)
HGB: 10 g/dL — ABNORMAL LOW (ref 11.6–15.9)
LYMPH%: 29.4 % (ref 14.0–49.7)
MCH: 30.3 pg (ref 25.1–34.0)
MCHC: 32.7 g/dL (ref 31.5–36.0)
MCV: 92.8 fL (ref 79.5–101.0)
MONO#: 0.3 10*3/uL (ref 0.1–0.9)
MONO%: 14.2 % — AB (ref 0.0–14.0)
NEUT#: 1.1 10*3/uL — ABNORMAL LOW (ref 1.5–6.5)
NEUT%: 54.6 % (ref 38.4–76.8)
Platelets: 242 10*3/uL (ref 145–400)
RBC: 3.3 10*6/uL — AB (ref 3.70–5.45)
RDW: 22.6 % — ABNORMAL HIGH (ref 11.2–14.5)
WBC: 2 10*3/uL — AB (ref 3.9–10.3)
lymph#: 0.6 10*3/uL — ABNORMAL LOW (ref 0.9–3.3)

## 2017-03-17 MED ORDER — DEXAMETHASONE SODIUM PHOSPHATE 10 MG/ML IJ SOLN
10.0000 mg | Freq: Once | INTRAMUSCULAR | Status: AC
  Administered 2017-03-17: 10 mg via INTRAVENOUS

## 2017-03-17 MED ORDER — DIPHENHYDRAMINE HCL 50 MG/ML IJ SOLN
25.0000 mg | Freq: Once | INTRAMUSCULAR | Status: AC
  Administered 2017-03-17: 25 mg via INTRAVENOUS

## 2017-03-17 MED ORDER — PACLITAXEL CHEMO INJECTION 300 MG/50ML
80.0000 mg/m2 | Freq: Once | INTRAVENOUS | Status: AC
  Administered 2017-03-17: 162 mg via INTRAVENOUS
  Filled 2017-03-17: qty 27

## 2017-03-17 MED ORDER — FAMOTIDINE IN NACL 20-0.9 MG/50ML-% IV SOLN
INTRAVENOUS | Status: AC
  Filled 2017-03-17: qty 50

## 2017-03-17 MED ORDER — DIPHENHYDRAMINE HCL 50 MG/ML IJ SOLN
INTRAMUSCULAR | Status: AC
  Filled 2017-03-17: qty 1

## 2017-03-17 MED ORDER — FAMOTIDINE IN NACL 20-0.9 MG/50ML-% IV SOLN
20.0000 mg | Freq: Once | INTRAVENOUS | Status: AC
  Administered 2017-03-17: 20 mg via INTRAVENOUS

## 2017-03-17 MED ORDER — DEXAMETHASONE SODIUM PHOSPHATE 10 MG/ML IJ SOLN
INTRAMUSCULAR | Status: AC
  Filled 2017-03-17: qty 1

## 2017-03-17 MED ORDER — HEPARIN SOD (PORK) LOCK FLUSH 100 UNIT/ML IV SOLN
500.0000 [IU] | Freq: Once | INTRAVENOUS | Status: AC | PRN
  Administered 2017-03-17: 500 [IU]
  Filled 2017-03-17: qty 5

## 2017-03-17 MED ORDER — SODIUM CHLORIDE 0.9% FLUSH
10.0000 mL | INTRAVENOUS | Status: DC | PRN
  Administered 2017-03-17: 10 mL
  Filled 2017-03-17: qty 10

## 2017-03-17 MED ORDER — SODIUM CHLORIDE 0.9 % IV SOLN
Freq: Once | INTRAVENOUS | Status: AC
  Administered 2017-03-17: 12:00:00 via INTRAVENOUS

## 2017-03-17 NOTE — Progress Notes (Signed)
Per Cira Rue NP okay to proceed with treatment with CBC results ( WBC 2.0, ANC 1.1) with no changes to dose, pt to receive Granix 03/18/17. Pt verbalizes understanding.

## 2017-03-17 NOTE — Patient Instructions (Signed)
[facility] Discharge Instructions for Patients Receiving Chemotherapy  Today you received the following chemotherapy agents:  Taxol.  To help prevent nausea and vomiting after your treatment, we encourage you to take your nausea medication as directed.   If you develop nausea and vomiting that is not controlled by your nausea medication, call the clinic.   BELOW ARE SYMPTOMS THAT SHOULD BE REPORTED IMMEDIATELY:  *FEVER GREATER THAN 100.5 F  *CHILLS WITH OR WITHOUT FEVER  NAUSEA AND VOMITING THAT IS NOT CONTROLLED WITH YOUR NAUSEA MEDICATION  *UNUSUAL SHORTNESS OF BREATH  *UNUSUAL BRUISING OR BLEEDING  TENDERNESS IN MOUTH AND THROAT WITH OR WITHOUT PRESENCE OF ULCERS  *URINARY PROBLEMS  *BOWEL PROBLEMS  UNUSUAL RASH Items with * indicate a potential emergency and should be followed up as soon as possible.  Feel free to call the clinic should you have any questions or concerns. The clinic phone number is [phone].  Please show the CHEMO ALERT CARD at check-in to the Emergency Department and triage nurse.   

## 2017-03-17 NOTE — Progress Notes (Signed)
[place]  [phone] [phone]  Clinic Follow up Note   Patient Care Team: Marton Redwood, MD as PCP - General (Internal Medicine) Melrose Nakayama, MD as Consulting Physician (Cardiothoracic Surgery) Fanny Skates, MD as Consulting Physician (General Surgery) Truitt Merle, MD as Consulting Physician (Hematology) 03/17/2017  CHIEF COMPLAINT: F/u left breast cancer   SUMMARY OF ONCOLOGIC HISTORY: Oncology History   Cancer Staging Breast cancer of upper-inner quadrant of left female breast [facility]) Staging form: Breast, AJCC 8th Edition - Clinical stage from 07/10/2016: Stage 0 (cTis (DCIS), cN0, cM0, G3, ER: Positive, PR: Positive, HER2: Not Assessed) - Signed by Truitt Merle, MD on 07/28/2016 - Pathologic stage from 10/26/2016: Stage IA (pT1a, pN1a, cM0, G2, ER: Positive, PR: Positive, HER2: Negative) - Signed by Truitt Merle, MD on 11/11/2016       Breast cancer of upper-inner quadrant of left female breast ([place])   07/10/2016 Initial Biopsy    Diagnosis 1. 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 2. 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      07/10/2016 Receptors her2    ER 95%, PR 80-90%+, both strong staining, Ki67 80-90%      07/22/2016 Initial Diagnosis    Ductal carcinoma in situ (DCIS) of left breast      07/27/2016 Imaging    Breast MRI w wo contrast showed  1. Extensive mass and non mass enhancement along the lower inner aspect the left breast, lower outer quadrant, spanning 10.2 cm from anterior to posterior, including both biopsied lesions, as detailed above. This is all consistent with DCIS. 2. No other abnormal enhancement in the left breast. 3. No abnormal or enlarged axillary lymph nodes. 4. No evidence of  malignancy in the right breast      08/10/2016 Genetic Testing    ATM c.[id number] VUS identified on the Common Hereditary Cancer panel. The Hereditary Gene Panel offered by [facility] includes sequencing and/or deletion duplication testing of the following 46 genes: APC, ATM, AXIN2, BARD1, BMPR1A, BRCA1, BRCA2, BRIP1, CDH1, CDKN2A (p14ARF), CDKN2A (p16INK4a), CHEK2, CTNNA1, DICER1, EPCAM (Deletion/duplication testing only), GREM1 (promoter region deletion/duplication testing only), KIT, MEN1, MLH1, MSH2, MSH3, MSH6, MUTYH, NBN, NF1, NHTL1, PALB2, PDGFRA, PMS2, POLD1, POLE, PTEN, RAD50, RAD51C, RAD51D, SDHB, SDHC, SDHD, SMAD4, SMARCA4. STK11, TP53, TSC1, TSC2, and VHL.  The following gene was evaluated for sequence changes only: SDHA and HOXB13 c.251G>A variant only.  The report date is Aug 09, 2016.       10/13/2016 Surgery    LEFT TOTAL MASTECTOMY WITH LEFT AXILLARY SENTINEL LYMPH NODE BIOPSY By Dr. Ninfa Linden      10/13/2016 Pathology Results    Surgery Diagnosis 10/13/16  1. Lymph node, sentinel, biopsy, Left Axillary - METASTATIC CARCINOMA IN ONE LYMPH NODE (1/1). 2. Lymph node, sentinel, biopsy, Left - METASTATIC CARCINOMA IN ONE LYMPH NODE (1/1). 3. Breast, radical mastectomy (including lymph nodes), Left - INVASIVE DUCTAL CARCINOMA, 0.3 CM. - LYMPHOVASCULAR INVOLVEMENT BY CARCINOMA. - HIGH GRADE DUCTAL CARCINOMA IN SITU WITH CALCIFICATIONS AND NECROSIS, 5.5 CM. - DCIS FOCALLY 0.1 CM FROM CAUTERIZED MEDIAL MARGIN. - SEE ONCOLOGY TABLE AND COMMENT. 4. Skin , Left additional mastectomy flap - BENIGN FIBROADIPOSE TISSUE. - NO EVIDENCE OF MALIGNANCY.      10/30/2016 Miscellaneous    MammaPrint 10/31/26 Result:  High Risk,Luminal type B  MPI: -0.368 Average 10-year risk of recurrence untreated: 29%      11/18/2016 Imaging    CT CAP W Contrast 11/18/16 IMPRESSION: 1. Skin thickening and interstitial changes involving the left breast, likely related to radiation. Surgical changes are also  noted with a tissue expander in place. Probable postop fluid collection in the upper outer quadrant breast. No enlarged supraclavicular or axillary lymph nodes. 2. No CT findings suggesting metastatic disease involving the chest, abdomen or pelvis or bony structures. 3. Surgical changes from a left upper lobe wedge resection. No findings for recurrent tumor.      11/23/2016 Imaging    Bone Scan Whole body 11/23/16 IMPRESSION: No definite scintigraphic evidence of osseous metastatic disease as above.      11/25/2016 -  Chemotherapy    Adjuvant chemotherapy AC every 2 weeks for 4 cycles, 11/25/16-01/05/17, followed by Taxol weekly for 12 cycles starting 01/20/17      PREVIOUS THERAPY: Adjuvant chemotherapy AC every 2 weeks for 4 cycles 11/25/16-01/05/17   CURRENT THERAPY: Taxol weekly for 12 cycles started 01/20/17, Add Granix injection on Day 2 starting with cycle 8.   INTERVAL HISTORY: Taylor Walsh returns for f/u as scheduled prior to cycle 9 weekly taxol. On day 2-3 after chemo she feels sluggish and tired, does not get out of bed much but then resolves. Toes with numbness and sensation of feeling tight, increased from last cycle; stable numbness to thumb and first finger bilaterally. Does not impair function, grip, balance, or gait. No fall. Mild bone aches in legs after granix, improved with NSAIDs; she forgot to take claritin. Has a healing sore on her tongue, feels better after salt water rinse. Otherwise has no complaints or concerns today.   REVIEW OF SYSTEMS:   Constitutional: Denies fevers, chills or abnormal weight loss (+) fatigue 2-3 days after chemo (+) decreased taste  Eyes: Denies blurriness of vision Ears, nose, mouth, throat, and face: Denies sore throat (+) healing sore on left side tongue  Respiratory: Denies cough, dyspnea or wheezes Cardiovascular: Denies palpitation, chest discomfort or lower extremity swelling Gastrointestinal:  Denies nausea, vomiting,  constipation, diarrhea, heartburn or change in bowel habits Skin: Denies abnormal skin rashes Lymphatics: Denies new lymphadenopathy or easy bruising Neurological:Denies tingling or new weaknesses (+) numbness to thumb and 1st finger bilaterally, improving (+) numbness and sensation of feeling tight to toes, increased from last cycle; not impairing function, gait, or balance  Behavioral/Psych: Mood is stable, no new changes  Breasts: No change All other systems were reviewed with the patient and are negative.  MEDICAL HISTORY:  Past Medical History:  Diagnosis Date  . Anxiety   . Arthritis   . Cancer ([place]) 10/08/2016   left breast cancer  . Diabetes mellitus without complication ([place])   . Dyslipidemia   . Family history of breast cancer   . Family history of ovarian cancer   . Family history of prostate cancer   . GERD (gastroesophageal reflux disease)    nexium if needed  . Heart murmur   . Hypertension   . Hypothyroid   . Osteopenia   . Pneumonia 10/08/2016   June 2013    SURGICAL HISTORY: Past Surgical History:  Procedure Laterality Date  . ABDOMINAL HYSTERECTOMY    . BREAST RECONSTRUCTION WITH PLACEMENT OF TISSUE EXPANDER AND FLEX HD (ACELLULAR HYDRATED DERMIS) Left 10/13/2016   Procedure: LEFT BREAST RECONSTRUCTION WITH PLACEMENT OF TISSUE EXPANDER AND ALLODERM;  Surgeon: Irene Limbo, MD;  Location: [place];  Service:  Plastics;  Laterality: Left;  . FOOT SURGERY     bi-lat/ repaired hammer toes  . MASTECTOMY W/ SENTINEL NODE BIOPSY Left 10/13/2016  . MASTECTOMY W/ SENTINEL NODE BIOPSY Left 10/13/2016   Procedure: LEFT TOTAL MASTECTOMY WITH LEFT AXILLARY SENTINEL LYMPH NODE BIOPSY;  Surgeon: Coralie Keens, MD;  Location: [place];  Service: General;  Laterality: Left;  . PORTACATH PLACEMENT Right 11/24/2016   Procedure: INSERTION PORT-A-CATH;  Surgeon: Fanny Skates, MD;  Location: [place];  Service: General;  Laterality: Right;  . TUBAL LIGATION     . VIDEO ASSISTED THORACOSCOPY (VATS)/WEDGE RESECTION Left 02/25/2015   Procedure: VIDEO ASSISTED THORACOSCOPY (VATS)/ LUL WEDGE RESECTION with ON Q placement.;  Surgeon: Melrose Nakayama, MD;  Location: [place];  Service: Thoracic;  Laterality: Left;    I have reviewed the social history and family history with the patient and they are unchanged from previous note.  ALLERGIES:  is allergic to vicodin [hydrocodone-acetaminophen].  MEDICATIONS:  Current Outpatient Medications  Medication Sig Dispense Refill  . aspirin 81 MG tablet Take 81 mg by mouth daily.    Marland Kitchen atorvastatin (LIPITOR) 40 MG tablet Take 40 mg by mouth daily.    . calcium-vitamin D 250-100 MG-UNIT tablet Take 2 tablets by mouth daily.     . ergocalciferol (VITAMIN D2) 50000 UNITS capsule Take 50,000 Units by mouth every Friday.     Marland Kitchen HYDROcodone-homatropine (HYCODAN) 5-1.5 MG/5ML syrup Take 5 mLs by mouth every 6 (six) hours as needed for cough. 120 mL 0  . ibuprofen (ADVIL,MOTRIN) 600 MG tablet Take 1 tablet (600 mg total) by mouth every 8 (eight) hours as needed. 15 tablet 0  . levothyroxine (SYNTHROID, LEVOTHROID) 88 MCG tablet Take 88 mcg by mouth daily before breakfast.     . lidocaine-prilocaine (EMLA) cream Apply to affected area once 30 g 3  . metFORMIN (GLUCOPHAGE) 500 MG tablet Take 1 tablet (500 mg total) 2 (two) times daily with a meal by mouth. 60 tablet 2  . olmesartan (BENICAR) 20 MG tablet Take 20 mg by mouth daily.    . traMADol (ULTRAM) 50 MG tablet Take 50 mg by mouth every 6 (six) hours as needed for moderate pain or severe pain.     Marland Kitchen guaiFENesin-codeine 100-10 MG/5ML syrup Take 5 mLs by mouth every 6 (six) hours as needed for cough. (Patient not taking: Reported on 03/03/2017) 120 mL 0  . HYDROcodone-acetaminophen (NORCO) 5-325 MG tablet Take 1-2 tablets by mouth every 6 (six) hours as needed for moderate pain or severe pain. (Patient not taking: Reported on 02/17/2017) 20 tablet 0  . methocarbamol  (ROBAXIN) 500 MG tablet Take 1 tablet (500 mg total) by mouth every 8 (eight) hours as needed for muscle spasms. (Patient not taking: Reported on 03/03/2017) 30 tablet 0  . ondansetron (ZOFRAN) 8 MG tablet Take 1 tablet (8 mg total) by mouth 2 (two) times daily as needed. Start on the third day after chemotherapy. (Patient not taking: Reported on 02/17/2017) 30 tablet 1  . prochlorperazine (COMPAZINE) 10 MG tablet Take 1 tablet (10 mg total) by mouth every 6 (six) hours as needed (Nausea or vomiting). (Patient not taking: Reported on 02/17/2017) 30 tablet 1   No current facility-administered medications for this visit.    Facility-Administered Medications Ordered in Other Visits  Medication Dose Route Frequency Provider Last Rate Last Dose  . sodium chloride flush (NS) 0.9 % injection 10 mL  10 mL Intracatheter PRN Truitt Merle, MD  10 mL at 03/17/17 1541    PHYSICAL EXAMINATION: ECOG PERFORMANCE STATUS: 1 - Symptomatic but completely ambulatory  Vitals:   03/17/17 1014  BP: (!) 157/74  Pulse: 92  Resp: 20  Temp: 98.2 F (36.8 C)  SpO2: 100%   Filed Weights   03/17/17 1014  Weight: 177 lb 11.2 oz (80.6 kg)    GENERAL:alert, no distress and comfortable SKIN: skin color, texture, turgor are normal, no rashes or significant lesions EYES: normal, conjunctiva are pink and non-injected, sclera clear OROPHARYNX:no exudate, no erythema and lips, buccal mucosa are normal. (+) tiny ulceration to left lateral tongue, healing NECK: supple, thyroid normal size, non-tender, without nodularity LYMPH:  no palpable cervical, supraclavicular, or axillary lymphadenopathy  LUNGS: clear to auscultation bilaterally with normal breathing effort HEART: regular rate & rhythm and no murmurs and no lower extremity edema ABDOMEN:abdomen soft, non-tender and normal bowel sounds Musculoskeletal:no cyanosis of digits and no clubbing  NEURO: alert & oriented x 3 with fluent speech, no focal motor deficits (+) no  decrease in vibratory sense to fingers/thumbs (+) mildly decreased vibratory sense to toes bilaterally per tuning fork exam BREASTS: right breast benign. (+) s/p left mastectomy with tissue expander; incision well healed; no tenderness  PAC without erythema   LABORATORY DATA:  I have reviewed the data as listed CBC Latest Ref Rng & Units 03/17/2017 03/10/2017 03/03/2017  WBC 3.9 - 10.3 10e3/uL 2.0(L) 3.6(L) 1.8(L)  Hemoglobin 11.6 - 15.9 g/dL 10.0(L) 10.0(L) 10.3(L)  Hematocrit 34.8 - 46.6 % 30.6(L) 30.7(L) 31.0(L)  Platelets 145 - 400 10e3/uL 242 250 255     CMP Latest Ref Rng & Units 03/17/2017 03/10/2017 03/03/2017  Glucose 70 - 140 mg/dl 96 96 106  BUN 7.0 - 26.0 mg/dL 7.4 6.8(L) 6.0(L)  Creatinine 0.6 - 1.1 mg/dL 0.7 0.7 0.7  Sodium 136 - 145 mEq/L 140 138 138  Potassium 3.5 - 5.1 mEq/L 4.2 3.6 4.1  Chloride 101 - 111 mmol/L - - -  CO2 22 - 29 mEq/L '26 22 24  ' Calcium 8.4 - 10.4 mg/dL 9.1 9.3 9.5  Total Protein 6.4 - 8.3 g/dL 6.6 6.8 6.8  Total Bilirubin 0.20 - 1.20 mg/dL 1.02 0.75 0.80  Alkaline Phos 40 - 150 U/L 58 67 58  AST 5 - 34 U/L '19 22 19  ' ALT 0 - 55 U/L '18 19 19   ' PATHOLOGY:   Surgery Diagnosis 10/13/16  1. Lymph node, sentinel, biopsy, Left Axillary - METASTATIC CARCINOMA IN ONE LYMPH NODE (1/1). 2. Lymph node, sentinel, biopsy, Left - METASTATIC CARCINOMA IN ONE LYMPH NODE (1/1). 3. Breast, radical mastectomy (including lymph nodes), Left - INVASIVE DUCTAL CARCINOMA, 0.3 CM. - LYMPHOVASCULAR INVOLVEMENT BY CARCINOMA. - HIGH GRADE DUCTAL CARCINOMA IN SITU WITH CALCIFICATIONS AND NECROSIS, 5.5 CM. - DCIS FOCALLY 0.1 CM FROM CAUTERIZED MEDIAL MARGIN. - SEE ONCOLOGY TABLE AND COMMENT. 4. Skin , Left additional mastectomy flap - BENIGN FIBROADIPOSE TISSUE. - NO EVIDENCE OF MALIGNANCY. Microscopic Comment 3. BREAST, INVASIVE TUMOR Procedure: Mastectomy and two sentinel lymph nodes with additional left mastectomy flap. Laterality: Left breast Tumor Size: 0.3  cm invasive carcinoma; 5.5 cm DCIS Histologic Type: Ductal Grade: II Tubular Differentiation: 2 Nuclear Pleomorphism: 2 Mitotic Count: 2 Ductal Carcinoma in Situ (DCIS): Present, high grade with necrosis Extent of Tumor: Skin: Free of tumor Nipple: Free of tumor Microscopic Comment(continued) Skeletal muscle: Free of tumor Margins: Free of tumor Invasive carcinoma, distance from closest margin: Greater than 1.5 cm DCIS, distance from closest margin: Less  than 0.1 cm from medial margin Regional Lymph Nodes: Number of Lymph Nodes Examined: 2 Number of Sentinel Lymph Nodes Examined: 2 Lymph Nodes with Macrometastases: 2 Lymph Nodes with Micrometastases: 0 Lymph Nodes with Isolated Tumor Cells: 0 Breast Prognostic Profile: Pending Estrogen Receptor: Pending Progesterone Receptor: Pending Her2: Pending Ki-67: Pending Best tumor block for sendout testing: 3[facility] Pathologic Stage Classification (pTNM, AJCC 8th Edition): Primary Tumor (pT): pT1a Regional Lymph Nodes (pN): pN1a Distant Metastases (pM): pMX Comments: There is extensive high grade ductal carcinoma in situ with necrosis. Multiple additional portions of the tissue are examined and there is a 0.3 cm invasive ductal carcinoma associated with lymphovascular involvement by tumor. Breast prognostic profile will be performed on the invasive carcinoma and reported as an addendum. Immunohistochemistry shows positive basal cell staining in the DCIS with calponin, smooth muscle myosin and p63. ADDITIONAL INFORMATION: 3. By immunohistochemistry, the tumor cells are Negative for Her2 (1+). Vicente Males MD Pathologist, Electronic Signature ( Signed 10/21/2016) 3. PROGNOSTIC INDICATORS For Part 3[facility] Results: IMMUNOHISTOCHEMICAL AND MORPHOMETRIC ANALYSIS PERFORMED MANUALLY Estrogen Receptor: 95%, POSITIVE, STRONG STAINING INTENSITY Progesterone Receptor: 45%, POSITIVE, STRONG STAINING INTENSITY Proliferation Marker Ki67: 15% ADDITIONAL  INFORMATION:(continued) Results: HER2 - *EQUIVOCAL*. OF NOTE, A TOTAL OF 40 TUMOR CELLS WERE EVALUATED FOR HER2 EXPRESSION. HER2 BY IMMUNOHISTOCHEMISTRY WILL BE PERFORMED AND THE RESULTS REPORTED SEPARATELY. RATIO OF HER2/CEP17 SIGNALS 1.67 AVERAGE HER2 COPY NUMBER PER CELL 4.18   Surgery: 07/10/16 Diagnosis 1. 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 - SEE MICROSCOPIC DESCRIPTION. 2. 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 - SEE MICROSCOPIC DESCRIPTION. Microscopic Comment 1. Prognostic markers have been ordered and will be reported in an addendum. 2. Prognostic markers have been ordered and will be reported in an addendum. Called to the [place] on 07/13/16. Results: IMMUNOHISTOCHEMICAL AND MORPHOMETRIC ANALYSIS PERFORMED MANUALLY Estrogen Receptor: 95%, POSITIVE, STRONG STAINING INTENSITY Progesterone Receptor: 80%, POSITIVE, STRONG STAINING INTENSITY  Biopsy: 02/25/15 Diagnosis 1. 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 - NO ATYPIA OR MALIGNANCY IDENTIFIED. - SEE COMMENT. 2. Lymph node, biopsy, 12 L - ONE BENIGN LYMPH NODE WITH NO TUMOR SEEN (0/1). 3. Lymph node, biopsy, 9 L - ONE BENIGN LYMPH NODE WITH NO TUMOR SEEN (0/1). Microscopic Comment 1. A Congo Red stain is performed and a crystal violet stain is performed on the lung specimen. The staining pattern (apple green birefringence) coupled with the morphology is consistent with amyloid deposition. Dr. Zenda Alpers, Dr. Fletcher Anon and Dr. Darl Householder have all seen this case in consultation with agreement that amyloid deposition is present. Dr. Donato Heinz has also seen this  case in consultation with agreement of the additional findings in the case.   MammaPrint 10/31/26 Result:  High Risk at 94.6% MPI: -0.368 Average 10-year risk of recurrence untreated: 29%   Genetics 07/29/16    PROCEDURES  ECHO 11/17/16 Study Conclusions - Left ventricle: The cavity size was normal. Wall thickness was   normal. Systolic function was normal. The estimated ejection   fraction was in the range of 60% to 65%. Wall motion was normal;   there were no regional wall motion abnormalities. Features are   consistent with a pseudonormal left ventricular filling pattern,   with concomitant abnormal relaxation and  increased filling   pressure (grade 2 diastolic dysfunction). - Aortic valve: There was mild regurgitation. Valve area (VTI):   1.84 cm^2. Valve area (Vmax): 1.76 cm^2. Valve area (Vmean): 1.89   cm^2.   RADIOGRAPHIC STUDIES: I have personally reviewed the radiological images as listed and agreed with the findings in the report. No results found.   ASSESSMENT & PLAN: Taylor Walsh is 61 y.o. who present to the clinic with Ductal carcinoma in situ (DCIS) of left breast  1. 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 Arthritis 3. Osteopenia 4. Smoking cessation 5. Genetics 6. Anemia secondary to chemotherapy  7. H/o pulmonary amyloidosis 8. Neutropenia 9. Therapy induced hyperglycemia 10. Hyperpigmentation of hands and feet 11. Mild neuropathy  Ms. Cosma appears stable today, appears to be tolerating weekly taxol well overall. She has completed 8 cycles. She has mildly decreased vibratory sense to feet bilaterally, but improvement in her vibratory sense in her hands from last cycle. All functional abilities are preserved. Will continue to monitor. She declined magic mouthwash for healing mucositis; I encouraged her  to continue warm salt water rinses. Weight is stable. BP is elevated, she was taken off BP meds for hypotension recently, will monitor closely and may restart medication if BP consistently high. Cmet unremarkable; CBC with mild neutropenia, ANC 1.1; she will get granix injection on day 2. She had mild bone pain with last injection, I reminded her to take claritin x5 days after. We reviewed neutropenic precautions. Hgb stable. She will continue weekly Taxol at current dose, due cycle 9 today. She will return in 1 week for cycle 10 and will f/u in 2 weeks. After completing 12 cycles she will proceed with adjuvant radiation. She is considering breast reconstructive surgery after that.   PLAN -Warm salt water rinse for mucositis -Claritin x5 days with granix -Reviewed neutropenic precautions -Labs reviewed, continue weekly Taxol at current dose, cycle 9 today, with granix next day -Return in 1 week for cycle 10, f/u in 2 weeks prior to cycle 11 Taxol  All questions were answered. The patient knows to call the clinic with any problems, questions or concerns. No barriers to learning was detected.     Alla Feeling, NP 03/17/17

## 2017-03-18 ENCOUNTER — Ambulatory Visit (HOSPITAL_BASED_OUTPATIENT_CLINIC_OR_DEPARTMENT_OTHER): Payer: BC Managed Care – PPO

## 2017-03-18 VITALS — BP 153/80 | HR 108 | Temp 97.8°F | Resp 18

## 2017-03-18 DIAGNOSIS — C50212 Malignant neoplasm of upper-inner quadrant of left female breast: Secondary | ICD-10-CM

## 2017-03-18 DIAGNOSIS — D701 Agranulocytosis secondary to cancer chemotherapy: Secondary | ICD-10-CM | POA: Diagnosis not present

## 2017-03-18 DIAGNOSIS — Z170 Estrogen receptor positive status [ER+]: Secondary | ICD-10-CM

## 2017-03-18 DIAGNOSIS — Z95828 Presence of other vascular implants and grafts: Secondary | ICD-10-CM

## 2017-03-18 MED ORDER — TBO-FILGRASTIM 480 MCG/0.8ML ~~LOC~~ SOSY
480.0000 ug | PREFILLED_SYRINGE | Freq: Once | SUBCUTANEOUS | Status: AC
  Administered 2017-03-18: 480 ug via SUBCUTANEOUS

## 2017-03-18 NOTE — Patient Instructions (Signed)
Tbo-Filgrastim injection What is this medicine? TBO-FILGRASTIM (T B O fil GRA stim) is a granulocyte colony-stimulating factor that stimulates the growth of neutrophils, a type of white blood cell important in the body's fight against infection. It is used to reduce the incidence of fever and infection in patients with certain types of cancer who are receiving chemotherapy that affects the bone marrow. This medicine may be used for other purposes; ask your health care provider or pharmacist if you have questions. COMMON BRAND NAME(S): Granix What should I tell my health care provider before I take this medicine? They need to know if you have any of these conditions: -bone scan or tests planned -kidney disease -sickle cell anemia -an unusual or allergic reaction to tbo-filgrastim, filgrastim, pegfilgrastim, other medicines, foods, dyes, or preservatives -pregnant or trying to get pregnant -breast-feeding How should I use this medicine? This medicine is for injection under the skin. If you get this medicine at home, you will be taught how to prepare and give this medicine. Refer to the Instructions for Use that come with your medication packaging. Use exactly as directed. Take your medicine at regular intervals. Do not take your medicine more often than directed. It is important that you put your used needles and syringes in a special sharps container. Do not put them in a trash can. If you do not have a sharps container, call your pharmacist or healthcare provider to get one. Talk to your pediatrician regarding the use of this medicine in children. Special care may be needed. Overdosage: If you think you have taken too much of this medicine contact a poison control center or emergency room at once. NOTE: This medicine is only for you. Do not share this medicine with others. What if I miss a dose? It is important not to miss your dose. Call your doctor or health care professional if you miss a  dose. What may interact with this medicine? This medicine may interact with the following medications: -medicines that may cause a release of neutrophils, such as lithium This list may not describe all possible interactions. Give your health care provider a list of all the medicines, herbs, non-prescription drugs, or dietary supplements you use. Also tell them if you smoke, drink alcohol, or use illegal drugs. Some items may interact with your medicine. What should I watch for while using this medicine? You may need blood work done while you are taking this medicine. What side effects may I notice from receiving this medicine? Side effects that you should report to your doctor or health care professional as soon as possible: -allergic reactions like skin rash, itching or hives, swelling of the face, lips, or tongue -blood in the urine -dark urine -dizziness -fast heartbeat -feeling faint -shortness of breath or breathing problems -signs and symptoms of infection like fever or chills; cough; or sore throat -signs and symptoms of kidney injury like trouble passing urine or change in the amount of urine -stomach or side pain, or pain at the shoulder -sweating -swelling of the legs, ankles, or abdomen -tiredness Side effects that usually do not require medical attention (report to your doctor or health care professional if they continue or are bothersome): -bone pain -headache -muscle pain -vomiting This list may not describe all possible side effects. Call your doctor for medical advice about side effects. You may report side effects to FDA at 1-800-FDA-1088. Where should I keep my medicine? Keep out of the reach of children. Store in a refrigerator between   2 and 8 degrees C (36 and 46 degrees F). Keep in carton to protect from light. Throw away this medicine if it is left out of the refrigerator for more than 5 consecutive days. Throw away any unused medicine after the expiration  date. NOTE: This sheet is a summary. It may not cover all possible information. If you have questions about this medicine, talk to your doctor, pharmacist, or health care provider.  2018 [employer]vier/Gold Standard (2015-05-06 19:07:04)  

## 2017-03-24 ENCOUNTER — Other Ambulatory Visit: Payer: Self-pay | Admitting: Nurse Practitioner

## 2017-03-24 ENCOUNTER — Ambulatory Visit: Payer: BC Managed Care – PPO

## 2017-03-24 ENCOUNTER — Other Ambulatory Visit (HOSPITAL_BASED_OUTPATIENT_CLINIC_OR_DEPARTMENT_OTHER): Payer: BC Managed Care – PPO

## 2017-03-24 ENCOUNTER — Ambulatory Visit (HOSPITAL_BASED_OUTPATIENT_CLINIC_OR_DEPARTMENT_OTHER): Payer: BC Managed Care – PPO

## 2017-03-24 VITALS — BP 147/80 | HR 83 | Temp 97.0°F | Resp 18 | Wt 175.1 lb

## 2017-03-24 DIAGNOSIS — C50212 Malignant neoplasm of upper-inner quadrant of left female breast: Secondary | ICD-10-CM

## 2017-03-24 DIAGNOSIS — Z170 Estrogen receptor positive status [ER+]: Principal | ICD-10-CM

## 2017-03-24 DIAGNOSIS — Z5111 Encounter for antineoplastic chemotherapy: Secondary | ICD-10-CM

## 2017-03-24 DIAGNOSIS — Z95828 Presence of other vascular implants and grafts: Secondary | ICD-10-CM

## 2017-03-24 DIAGNOSIS — K1230 Oral mucositis (ulcerative), unspecified: Secondary | ICD-10-CM

## 2017-03-24 LAB — COMPREHENSIVE METABOLIC PANEL
ALBUMIN: 3.7 g/dL (ref 3.5–5.0)
ALK PHOS: 56 U/L (ref 40–150)
ALT: 22 U/L (ref 0–55)
ANION GAP: 9 meq/L (ref 3–11)
AST: 22 U/L (ref 5–34)
BILIRUBIN TOTAL: 0.91 mg/dL (ref 0.20–1.20)
BUN: 7.5 mg/dL (ref 7.0–26.0)
CALCIUM: 9.4 mg/dL (ref 8.4–10.4)
CO2: 25 mEq/L (ref 22–29)
CREATININE: 0.7 mg/dL (ref 0.6–1.1)
Chloride: 105 mEq/L (ref 98–109)
EGFR: >60 mL/min/{1.73_m2} (ref >60)
Glucose: 128 mg/dl (ref 70–140)
Potassium: 4 mEq/L (ref 3.5–5.1)
Sodium: 139 mEq/L (ref 136–145)
TOTAL PROTEIN: 6.4 g/dL (ref 6.4–8.3)

## 2017-03-24 LAB — CBC WITH DIFFERENTIAL/PLATELET
BASO%: 0.5 % (ref 0.0–2.0)
Basophils Absolute: 0 10*3/uL (ref 0.0–0.1)
EOS ABS: 0 10*3/uL (ref 0.0–0.5)
EOS%: 0.5 % (ref 0.0–7.0)
HCT: 31.4 % — ABNORMAL LOW (ref 34.8–46.6)
HGB: 10 g/dL — ABNORMAL LOW (ref 11.6–15.9)
LYMPH%: 34.4 % (ref 14.0–49.7)
MCH: 30.4 pg (ref 25.1–34.0)
MCHC: 31.8 g/dL (ref 31.5–36.0)
MCV: 95.4 fL (ref 79.5–101.0)
MONO#: 0.3 10*3/uL (ref 0.1–0.9)
MONO%: 15.4 % — AB (ref 0.0–14.0)
NEUT%: 49.2 % (ref 38.4–76.8)
NEUTROS ABS: 1 10*3/uL — AB (ref 1.5–6.5)
Platelets: 261 10*3/uL (ref 145–400)
RBC: 3.29 10*6/uL — AB (ref 3.70–5.45)
RDW: 19.6 % — AB (ref 11.2–14.5)
WBC: 2 10*3/uL — AB (ref 3.9–10.3)
lymph#: 0.7 10*3/uL — ABNORMAL LOW (ref 0.9–3.3)

## 2017-03-24 MED ORDER — DEXAMETHASONE SODIUM PHOSPHATE 10 MG/ML IJ SOLN
10.0000 mg | Freq: Once | INTRAMUSCULAR | Status: AC
  Administered 2017-03-24: 10 mg via INTRAVENOUS

## 2017-03-24 MED ORDER — PACLITAXEL CHEMO INJECTION 300 MG/50ML
80.0000 mg/m2 | Freq: Once | INTRAVENOUS | Status: DC
  Filled 2017-03-24: qty 27

## 2017-03-24 MED ORDER — DEXAMETHASONE SODIUM PHOSPHATE 10 MG/ML IJ SOLN
INTRAMUSCULAR | Status: AC
  Filled 2017-03-24: qty 1

## 2017-03-24 MED ORDER — SODIUM CHLORIDE 0.9 % IV SOLN
40.0000 mg/m2 | Freq: Once | INTRAVENOUS | Status: AC
  Administered 2017-03-24: 78 mg via INTRAVENOUS
  Filled 2017-03-24: qty 13

## 2017-03-24 MED ORDER — MAGIC MOUTHWASH W/LIDOCAINE: 5.0000 mL | Freq: Three times a day (TID) | ORAL | 1 refills | Status: DC | PRN

## 2017-03-24 MED ORDER — HEPARIN SOD (PORK) LOCK FLUSH 100 UNIT/ML IV SOLN
500.0000 [IU] | Freq: Once | INTRAVENOUS | Status: AC | PRN
  Administered 2017-03-24: 500 [IU]
  Filled 2017-03-24: qty 5

## 2017-03-24 MED ORDER — DIPHENHYDRAMINE HCL 50 MG/ML IJ SOLN
INTRAMUSCULAR | Status: AC
  Filled 2017-03-24: qty 1

## 2017-03-24 MED ORDER — FAMOTIDINE IN NACL 20-0.9 MG/50ML-% IV SOLN
20.0000 mg | Freq: Once | INTRAVENOUS | Status: AC
  Administered 2017-03-24: 20 mg via INTRAVENOUS

## 2017-03-24 MED ORDER — SODIUM CHLORIDE 0.9% FLUSH
10.0000 mL | Freq: Once | INTRAVENOUS | Status: AC
  Administered 2017-03-24: 10 mL
  Filled 2017-03-24: qty 10

## 2017-03-24 MED ORDER — FAMOTIDINE IN NACL 20-0.9 MG/50ML-% IV SOLN
INTRAVENOUS | Status: AC
  Filled 2017-03-24: qty 50

## 2017-03-24 MED ORDER — DIPHENHYDRAMINE HCL 50 MG/ML IJ SOLN
25.0000 mg | Freq: Once | INTRAMUSCULAR | Status: AC
  Administered 2017-03-24: 25 mg via INTRAVENOUS

## 2017-03-24 MED ORDER — SODIUM CHLORIDE 0.9 % IV SOLN
Freq: Once | INTRAVENOUS | Status: AC
  Administered 2017-03-24: 13:00:00 via INTRAVENOUS

## 2017-03-24 MED ORDER — SODIUM CHLORIDE 0.9% FLUSH
10.0000 mL | INTRAVENOUS | Status: DC | PRN
  Administered 2017-03-24: 10 mL
  Filled 2017-03-24: qty 10

## 2017-03-24 NOTE — Progress Notes (Signed)
I saw Ms. Jabbour in infusion area. She has persistent sore to left tongue that is not improving with salt water rinses, now affecting po intake. I will call in magic mouthwash to her pharmacy. She notes her fatigue from last cycle has not recovered like usual and she feels very tired, but still able to push forward with activities. Neuropathy to toes feels slightly increased, worse when walking on cold floors. Not painful or sensitive at this point but persistently numb/tingling. Not affective gait or balance. ANC 1.0 today; no fever/chills, cough, n/v/c/d. I suggest decreasing taxol with this cycle. She will get granix on day 2. After discussion and review with Dr. Benay Spice, he recommends decreasing Taxol to 40 mg/m2 with cycle 10; pharmacy is aware.

## 2017-03-24 NOTE — Progress Notes (Signed)
Okay to treat with labs per Taylor Rue NP  To get Neupogen tomorrow

## 2017-03-24 NOTE — Patient Instructions (Signed)
Paclitaxel injection What is this medicine? PACLITAXEL (PAK li TAX el) is a chemotherapy drug. It targets fast dividing cells, like cancer cells, and causes these cells to die. This medicine is used to treat ovarian cancer, breast cancer, and other cancers. This medicine may be used for other purposes; ask your health care provider or pharmacist if you have questions. COMMON BRAND NAME(S): Onxol, Taxol What should I tell my health care provider before I take this medicine? They need to know if you have any of these conditions: -blood disorders -irregular heartbeat -infection (especially a virus infection such as chickenpox, cold sores, or herpes) -liver disease -previous or ongoing radiation therapy -an unusual or allergic reaction to paclitaxel, alcohol, polyoxyethylated castor oil, other chemotherapy agents, other medicines, foods, dyes, or preservatives -pregnant or trying to get pregnant -breast-feeding How should I use this medicine? This drug is given as an infusion into a vein. It is administered in a hospital or clinic by a specially trained health care professional. Talk to your pediatrician regarding the use of this medicine in children. Special care may be needed. Overdosage: If you think you have taken too much of this medicine contact a poison control center or emergency room at once. NOTE: This medicine is only for you. Do not share this medicine with others. What if I miss a dose? It is important not to miss your dose. Call your doctor or health care professional if you are unable to keep an appointment. What may interact with this medicine? Do not take this medicine with any of the following medications: -disulfiram -metronidazole This medicine may also interact with the following medications: -cyclosporine -diazepam -ketoconazole -medicines to increase blood counts like filgrastim, pegfilgrastim, sargramostim -other chemotherapy drugs like cisplatin, doxorubicin,  epirubicin, etoposide, teniposide, vincristine -quinidine -testosterone -vaccines -verapamil Talk to your doctor or health care professional before taking any of these medicines: -acetaminophen -aspirin -ibuprofen -ketoprofen -naproxen This list may not describe all possible interactions. Give your health care provider a list of all the medicines, herbs, non-prescription drugs, or dietary supplements you use. Also tell them if you smoke, drink alcohol, or use illegal drugs. Some items may interact with your medicine. What should I watch for while using this medicine? Your condition will be monitored carefully while you are receiving this medicine. You will need important blood work done while you are taking this medicine. This medicine can cause serious allergic reactions. To reduce your risk you will need to take other medicine(s) before treatment with this medicine. If you experience allergic reactions like skin rash, itching or hives, swelling of the face, lips, or tongue, tell your doctor or health care professional right away. In some cases, you may be given additional medicines to help with side effects. Follow all directions for their use. This drug may make you feel generally unwell. This is not uncommon, as chemotherapy can affect healthy cells as well as cancer cells. Report any side effects. Continue your course of treatment even though you feel ill unless your doctor tells you to stop. Call your doctor or health care professional for advice if you get a fever, chills or sore throat, or other symptoms of a cold or flu. Do not treat yourself. This drug decreases your body's ability to fight infections. Try to avoid being around people who are sick. This medicine may increase your risk to bruise or bleed. Call your doctor or health care professional if you notice any unusual bleeding. Be careful brushing and flossing your teeth or   using a toothpick because you may get an infection or  bleed more easily. If you have any dental work done, tell your dentist you are receiving this medicine. Avoid taking products that contain aspirin, acetaminophen, ibuprofen, naproxen, or ketoprofen unless instructed by your doctor. These medicines may hide a fever. Do not become pregnant while taking this medicine. Women should inform their doctor if they wish to become pregnant or think they might be pregnant. There is a potential for serious side effects to an unborn child. Talk to your health care professional or pharmacist for more information. Do not breast-feed an infant while taking this medicine. Men are advised not to father a child while receiving this medicine. This product may contain alcohol. Ask your pharmacist or healthcare provider if this medicine contains alcohol. Be sure to tell all healthcare providers you are taking this medicine. Certain medicines, like metronidazole and disulfiram, can cause an unpleasant reaction when taken with alcohol. The reaction includes flushing, headache, nausea, vomiting, sweating, and increased thirst. The reaction can last from 30 minutes to several hours. What side effects may I notice from receiving this medicine? Side effects that you should report to your doctor or health care professional as soon as possible: -allergic reactions like skin rash, itching or hives, swelling of the face, lips, or tongue -low blood counts - This drug may decrease the number of white blood cells, red blood cells and platelets. You may be at increased risk for infections and bleeding. -signs of infection - fever or chills, cough, sore throat, pain or difficulty passing urine -signs of decreased platelets or bleeding - bruising, pinpoint red spots on the skin, black, tarry stools, nosebleeds -signs of decreased red blood cells - unusually weak or tired, fainting spells, lightheadedness -breathing problems -chest pain -high or low blood pressure -mouth sores -nausea and  vomiting -pain, swelling, redness or irritation at the injection site -pain, tingling, numbness in the hands or feet -slow or irregular heartbeat -swelling of the ankle, feet, hands Side effects that usually do not require medical attention (report to your doctor or health care professional if they continue or are bothersome): -bone pain -complete hair loss including hair on your head, underarms, pubic hair, eyebrows, and eyelashes -changes in the color of fingernails -diarrhea -loosening of the fingernails -loss of appetite -muscle or joint pain -red flush to skin -sweating This list may not describe all possible side effects. Call your doctor for medical advice about side effects. You may report side effects to FDA at 1-800-FDA-1088. Where should I keep my medicine? This drug is given in a hospital or clinic and will not be stored at home. NOTE: This sheet is a summary. It may not cover all possible information. If you have questions about this medicine, talk to your doctor, pharmacist, or health care provider.  2018 [employer] (2015-01-15 19:58:00)  

## 2017-03-25 ENCOUNTER — Ambulatory Visit (HOSPITAL_BASED_OUTPATIENT_CLINIC_OR_DEPARTMENT_OTHER): Payer: BC Managed Care – PPO

## 2017-03-25 ENCOUNTER — Telehealth: Payer: Self-pay | Admitting: *Deleted

## 2017-03-25 VITALS — BP 143/82 | HR 108 | Temp 98.2°F | Resp 18

## 2017-03-25 DIAGNOSIS — Z170 Estrogen receptor positive status [ER+]: Secondary | ICD-10-CM

## 2017-03-25 DIAGNOSIS — D701 Agranulocytosis secondary to cancer chemotherapy: Secondary | ICD-10-CM

## 2017-03-25 DIAGNOSIS — C50212 Malignant neoplasm of upper-inner quadrant of left female breast: Secondary | ICD-10-CM

## 2017-03-25 DIAGNOSIS — Z95828 Presence of other vascular implants and grafts: Secondary | ICD-10-CM

## 2017-03-25 MED ORDER — TBO-FILGRASTIM 480 MCG/0.8ML ~~LOC~~ SOSY
480.0000 ug | PREFILLED_SYRINGE | Freq: Once | SUBCUTANEOUS | Status: AC
  Administered 2017-03-25: 480 ug via SUBCUTANEOUS

## 2017-03-25 MED ORDER — TBO-FILGRASTIM 480 MCG/0.8ML ~~LOC~~ SOSY
PREFILLED_SYRINGE | SUBCUTANEOUS | Status: AC
  Filled 2017-03-25: qty 0.8

## 2017-03-25 NOTE — Telephone Encounter (Signed)
Received call from Erhard with question of concentration/ingredients of MMW.  Infomed 1:1:1 lidocaine, benadry, maalox.

## 2017-03-25 NOTE — Patient Instructions (Signed)
Tbo-Filgrastim injection What is this medicine? TBO-FILGRASTIM (T B O fil GRA stim) is a granulocyte colony-stimulating factor that stimulates the growth of neutrophils, a type of white blood cell important in the body's fight against infection. It is used to reduce the incidence of fever and infection in patients with certain types of cancer who are receiving chemotherapy that affects the bone marrow. This medicine may be used for other purposes; ask your health care provider or pharmacist if you have questions. COMMON BRAND NAME(S): Granix What should I tell my health care provider before I take this medicine? They need to know if you have any of these conditions: -bone scan or tests planned -kidney disease -sickle cell anemia -an unusual or allergic reaction to tbo-filgrastim, filgrastim, pegfilgrastim, other medicines, foods, dyes, or preservatives -pregnant or trying to get pregnant -breast-feeding How should I use this medicine? This medicine is for injection under the skin. If you get this medicine at home, you will be taught how to prepare and give this medicine. Refer to the Instructions for Use that come with your medication packaging. Use exactly as directed. Take your medicine at regular intervals. Do not take your medicine more often than directed. It is important that you put your used needles and syringes in a special sharps container. Do not put them in a trash can. If you do not have a sharps container, call your pharmacist or healthcare provider to get one. Talk to your pediatrician regarding the use of this medicine in children. Special care may be needed. Overdosage: If you think you have taken too much of this medicine contact a poison control center or emergency room at once. NOTE: This medicine is only for you. Do not share this medicine with others. What if I miss a dose? It is important not to miss your dose. Call your doctor or health care professional if you miss a  dose. What may interact with this medicine? This medicine may interact with the following medications: -medicines that may cause a release of neutrophils, such as lithium This list may not describe all possible interactions. Give your health care provider a list of all the medicines, herbs, non-prescription drugs, or dietary supplements you use. Also tell them if you smoke, drink alcohol, or use illegal drugs. Some items may interact with your medicine. What should I watch for while using this medicine? You may need blood work done while you are taking this medicine. What side effects may I notice from receiving this medicine? Side effects that you should report to your doctor or health care professional as soon as possible: -allergic reactions like skin rash, itching or hives, swelling of the face, lips, or tongue -blood in the urine -dark urine -dizziness -fast heartbeat -feeling faint -shortness of breath or breathing problems -signs and symptoms of infection like fever or chills; cough; or sore throat -signs and symptoms of kidney injury like trouble passing urine or change in the amount of urine -stomach or side pain, or pain at the shoulder -sweating -swelling of the legs, ankles, or abdomen -tiredness Side effects that usually do not require medical attention (report to your doctor or health care professional if they continue or are bothersome): -bone pain -headache -muscle pain -vomiting This list may not describe all possible side effects. Call your doctor for medical advice about side effects. You may report side effects to FDA at 1-800-FDA-1088. Where should I keep my medicine? Keep out of the reach of children. Store in a refrigerator between   2 and 8 degrees C (36 and 46 degrees F). Keep in carton to protect from light. Throw away this medicine if it is left out of the refrigerator for more than 5 consecutive days. Throw away any unused medicine after the expiration  date. NOTE: This sheet is a summary. It may not cover all possible information. If you have questions about this medicine, talk to your doctor, pharmacist, or health care provider.  2018 [employer]vier/Gold Standard (2015-05-06 19:07:04)  

## 2017-03-26 NOTE — Progress Notes (Signed)
[place]  [phone] [phone]) [phone]  Clinic Follow-up Note   Patient Care Team: Marton Redwood, MD as PCP - General (Internal Medicine) Melrose Nakayama, MD as Consulting Physician (Cardiothoracic Surgery) Fanny Skates, MD as Consulting Physician (General Surgery) Truitt Merle, MD as Consulting Physician (Hematology)   Date of Service:  03/31/2017    CHIEF COMPLAINTS F/U left breast cancer   Oncology History   Cancer Staging Breast cancer of upper-inner quadrant of left female breast [facility]) Staging form: Breast, AJCC 8th Edition - Clinical stage from 07/10/2016: Stage 0 (cTis (DCIS), cN0, cM0, G3, ER: Positive, PR: Positive, HER2: Not Assessed) - Signed by Truitt Merle, MD on 07/28/2016 - Pathologic stage from 10/26/2016: Stage IA (pT1a, pN1a, cM0, G2, ER: Positive, PR: Positive, HER2: Negative) - Signed by Truitt Merle, MD on 11/11/2016       Breast cancer of upper-inner quadrant of left female breast ([place])   07/10/2016 Initial Biopsy    Diagnosis 1. 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 2. 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      07/10/2016 Receptors her2    ER 95%, PR 80-90%+, both strong staining, Ki67 80-90%      07/22/2016 Initial Diagnosis    Ductal carcinoma in situ (DCIS) of left breast      07/27/2016 Imaging    Breast MRI w wo contrast showed  1. Extensive mass and non mass enhancement along the lower inner aspect the left breast, lower outer quadrant, spanning 10.2 cm from anterior to posterior, including both biopsied lesions, as detailed above. This is all consistent with DCIS. 2. No other abnormal enhancement in the left breast. 3. No abnormal or enlarged axillary lymph nodes. 4. No evidence of malignancy  in the right breast      08/10/2016 Genetic Testing    ATM c.4066A>G VUS identified on the Common Hereditary Cancer panel. The Hereditary Gene Panel offered by [facility] includes sequencing and/or deletion duplication testing of the following 46 genes: APC, ATM, AXIN2, BARD1, BMPR1A, BRCA1, BRCA2, BRIP1, CDH1, CDKN2A (p14ARF), CDKN2A (p16INK4a), CHEK2, CTNNA1, DICER1, EPCAM (Deletion/duplication testing only), GREM1 (promoter region deletion/duplication testing only), KIT, MEN1, MLH1, MSH2, MSH3, MSH6, MUTYH, NBN, NF1, NHTL1, PALB2, PDGFRA, PMS2, POLD1, POLE, PTEN, RAD50, RAD51C, RAD51D, SDHB, SDHC, SDHD, SMAD4, SMARCA4. STK11, TP53, TSC1, TSC2, and VHL.  The following gene was evaluated for sequence changes only: SDHA and HOXB13 c.251G>A variant only.  The report date is Aug 09, 2016.       10/13/2016 Surgery    LEFT TOTAL MASTECTOMY WITH LEFT AXILLARY SENTINEL LYMPH NODE BIOPSY By Dr. Ninfa Linden      10/13/2016 Pathology Results    Surgery Diagnosis 10/13/16  1. Lymph node, sentinel, biopsy, Left Axillary - METASTATIC CARCINOMA IN ONE LYMPH NODE (1/1). 2. Lymph node, sentinel, biopsy, Left - METASTATIC CARCINOMA IN ONE LYMPH NODE (1/1). 3. Breast, radical mastectomy (including lymph nodes), Left - INVASIVE DUCTAL CARCINOMA, 0.3 CM. - LYMPHOVASCULAR INVOLVEMENT BY CARCINOMA. - HIGH GRADE DUCTAL CARCINOMA IN SITU WITH CALCIFICATIONS AND NECROSIS, 5.5 CM. - DCIS FOCALLY 0.1 CM FROM CAUTERIZED MEDIAL MARGIN. - SEE ONCOLOGY TABLE AND COMMENT. 4. Skin , Left additional mastectomy flap - BENIGN FIBROADIPOSE TISSUE. - NO EVIDENCE OF MALIGNANCY.      10/30/2016 Miscellaneous    MammaPrint 10/31/26 Result:  High  Risk,Luminal type B MPI: -0.368 Average 10-year risk of recurrence untreated: 29%      11/18/2016 Imaging    CT CAP W Contrast 11/18/16 IMPRESSION: 1. Skin thickening and interstitial changes involving the left breast, likely related to radiation. Surgical changes are also noted with a  tissue expander in place. Probable postop fluid collection in the upper outer quadrant breast. No enlarged supraclavicular or axillary lymph nodes. 2. No CT findings suggesting metastatic disease involving the chest, abdomen or pelvis or bony structures. 3. Surgical changes from a left upper lobe wedge resection. No findings for recurrent tumor.      11/23/2016 Imaging    Bone Scan Whole body 11/23/16 IMPRESSION: No definite scintigraphic evidence of osseous metastatic disease as above.      11/25/2016 -  Chemotherapy    Adjuvant chemotherapy AC every 2 weeks for 4 cycles, 11/25/16-01/05/17, followed by Taxol weekly for 12 cycles starting 01/20/17         HISTORY OF PRESENTING ILLNESS (07/28/16):  Taylor Walsh 61 y.o. 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 She presents to the clinic today by herself. She was referred by her surgeon Dr. Dalbert Batman.   This was discovered by routine screening mammogram. She denies any palpable breast mass or adenopathy. She denies any other new symptoms. She was contacted for her need of a biopsy to further investigate the calcification found in her mammogram. A biopsy was done leading to a diagnosis of her DCIS. She reports having arthritis in the right knee. She reported having a tubal ligation and hysterectomy. She also has family history of breast cancer with mother and sister. She reports to live with her daughter and 2 of her grandchildren. She reports of having history of HTN, arthritis, Thyroidism, being Pre-diabetic, Osteopenia and is currently back to Smoking. She also reports her brother has a history of blood clots and he was told that it was hereditary which she is following up with Dr. Brigitte Pulse about testing.   GYN HISTORY  Menarchal: 11 LMP: 36 before hysterectomy Contraceptive: BN/A HRT: no GP:G2P2    PREVIOUS THERAPY: Adjuvant chemotherapy AC every 2 weeks for 4 cycles 11/25/16-01/05/17   CURRENT THERAPY: Taxol  weekly for 12 cycles started 01/20/17, Add Granix injection on Day 2 starting with cycle 7. Reduced Taxol to 36m/m2 due to her neuropathy.    INTERVAL HISTORY:   Taylor COCCIAis here for a follow up and 11th cycle taxol. She presents to the clinic today noting she feels better and able to enjoy New Years. She notes she has 2 more cycles left before radiation. She asked about who would be able to take her port out. She notes the neuropathy in her feet was worse than her hands but since reducing her chemo dose the neuropathy has not been as significant. She notes she has start having harder time to latch her bra but still has good function. She notes change in her balance and will feel cold with on non-carpeted floors.    MEDICAL HISTORY:  Past Medical History:  Diagnosis Date  . Anxiety   . Arthritis   . Cancer ([place] 10/[place]2016   left breast cancer  . Diabetes mellitus without complication ([place]   . Dyslipidemia   . Family history of breast cancer   . Family history of ovarian cancer   . Family history of prostate cancer   . GERD (gastroesophageal reflux disease)    nexium if needed  .  Heart murmur   . Hypertension   . Hypothyroid   . Osteopenia   . Pneumonia 10/08/2016   June 2013    SURGICAL HISTORY: Past Surgical History:  Procedure Laterality Date  . ABDOMINAL HYSTERECTOMY    . BREAST RECONSTRUCTION WITH PLACEMENT OF TISSUE EXPANDER AND FLEX HD (ACELLULAR HYDRATED DERMIS) Left 10/13/2016   Procedure: LEFT BREAST RECONSTRUCTION WITH PLACEMENT OF TISSUE EXPANDER AND ALLODERM;  Surgeon: Irene Limbo, MD;  Location: [place];  Service: Plastics;  Laterality: Left;  . FOOT SURGERY     bi-lat/ repaired hammer toes  . MASTECTOMY W/ SENTINEL NODE BIOPSY Left 10/13/2016  . MASTECTOMY W/ SENTINEL NODE BIOPSY Left 10/13/2016   Procedure: LEFT TOTAL MASTECTOMY WITH LEFT AXILLARY SENTINEL LYMPH NODE BIOPSY;  Surgeon: Coralie Keens, MD;  Location: [place];  Service: General;   Laterality: Left;  . PORTACATH PLACEMENT Right 11/24/2016   Procedure: INSERTION PORT-A-CATH;  Surgeon: Fanny Skates, MD;  Location: [place];  Service: General;  Laterality: Right;  . TUBAL LIGATION    . VIDEO ASSISTED THORACOSCOPY (VATS)/WEDGE RESECTION Left 02/25/2015   Procedure: VIDEO ASSISTED THORACOSCOPY (VATS)/ LUL WEDGE RESECTION with ON Q placement.;  Surgeon: Melrose Nakayama, MD;  Location: [place];  Service: Thoracic;  Laterality: Left;    SOCIAL HISTORY: Social History   Socioeconomic History  . Marital status: Single    Spouse name: Not on file  . Number of children: 2  . Years of education: Not on file  . Highest education level: Not on file  Social Needs  . Financial resource strain: Not on file  . Food insecurity - worry: Not on file  . Food insecurity - inability: Not on file  . Transportation needs - medical: Not on file  . Transportation needs - non-medical: Not on file  Occupational History  . Not on file  Tobacco Use  . Smoking status: Former Smoker    Packs/day: 1.00    Years: 42.00    Pack years: 42.00    Last attempt to quit: 08/27/2016    Years since quitting: 0.5  . Smokeless tobacco: Never Used  . Tobacco comment: 5/1/18she smokes occasionally when stressed. She is smoking 4 cigarettes a week  Substance and Sexual Activity  . Alcohol use: No    Alcohol/week: 0.0 oz  . Drug use: No  . Sexual activity: Not on file  Other Topics Concern  . Not on file  Social History Narrative  . Not on file    FAMILY HISTORY: Family History  Problem Relation Age of Onset  . Cancer Mother        brain, ovarian  . Hypertension Mother   . Stroke Father   . Hypertension Father   . Cancer Sister 33       bilateral breast cancer, 2nd cancer at 32  . Cancer Brother        dx with prostate cancer in the 74s  . Cancer Brother        dx in his 96s with prostate cancer  . Brain cancer Maternal Uncle   . Cirrhosis Brother   . Cancer  Brother        NOS  . Cancer Paternal Aunt        NOS  . Cancer Cousin        paternal cousin with cancer NOS    ALLERGIES:  is allergic to vicodin [hydrocodone-acetaminophen].  MEDICATIONS:  Current Outpatient Medications  Medication Sig Dispense Refill  . aspirin  81 MG tablet Take 81 mg by mouth daily.    Marland Kitchen atorvastatin (LIPITOR) 40 MG tablet Take 40 mg by mouth daily.    . calcium-vitamin D 250-100 MG-UNIT tablet Take 2 tablets by mouth daily.     . ergocalciferol (VITAMIN D2) 50000 UNITS capsule Take 50,000 Units by mouth every Friday.     Marland Kitchen HYDROcodone-homatropine (HYCODAN) 5-1.5 MG/5ML syrup Take 5 mLs by mouth every 6 (six) hours as needed for cough. 120 mL 0  . ibuprofen (ADVIL,MOTRIN) 600 MG tablet Take 1 tablet (600 mg total) by mouth every 8 (eight) hours as needed. 15 tablet 0  . levothyroxine (SYNTHROID, LEVOTHROID) 88 MCG tablet Take 88 mcg by mouth daily before breakfast.     . lidocaine-prilocaine (EMLA) cream Apply to affected area once 30 g 3  . magic mouthwash w/lidocaine SOLN Take 5 mLs by mouth 3 (three) times daily as needed for mouth pain. 240 mL 1  . metFORMIN (GLUCOPHAGE) 500 MG tablet Take 1 tablet (500 mg total) 2 (two) times daily with a meal by mouth. 60 tablet 2  . olmesartan (BENICAR) 20 MG tablet Take 20 mg by mouth daily.    . ondansetron (ZOFRAN) 8 MG tablet Take 1 tablet (8 mg total) by mouth 2 (two) times daily as needed. Start on the third day after chemotherapy. 30 tablet 1  . traMADol (ULTRAM) 50 MG tablet Take 50 mg by mouth every 6 (six) hours as needed for moderate pain or severe pain.     Marland Kitchen guaiFENesin-codeine 100-10 MG/5ML syrup Take 5 mLs by mouth every 6 (six) hours as needed for cough. (Patient not taking: Reported on 03/03/2017) 120 mL 0  . HYDROcodone-acetaminophen (NORCO) 5-325 MG tablet Take 1-2 tablets by mouth every 6 (six) hours as needed for moderate pain or severe pain. (Patient not taking: Reported on 02/17/2017) 20 tablet 0  .  methocarbamol (ROBAXIN) 500 MG tablet Take 1 tablet (500 mg total) by mouth every 8 (eight) hours as needed for muscle spasms. (Patient not taking: Reported on 03/03/2017) 30 tablet 0  . prochlorperazine (COMPAZINE) 10 MG tablet Take 1 tablet (10 mg total) by mouth every 6 (six) hours as needed (Nausea or vomiting). (Patient not taking: Reported on 02/17/2017) 30 tablet 1   No current facility-administered medications for this visit.     REVIEW OF SYSTEMS:  Constitutional: Denies fevers, chills  Eyes: Denies blurriness of vision, double vision or watery eyes. Denies eye pain, redness, or itching  Ears, nose, mouth, throat, and face: Denies mucositis or nasal congestion  Respiratory: (+) occasional cough  Cardiovascular: Denies palpitation, chest discomfort or lower extremity swelling Gastrointestinal:  Denies vomiting, constipation, diarrhea, or change in bowel habits  GU/GYN: recurrent herpes outbreak, improving, some residual sensitivity Skin: Denies abnormal skin rashes Lymphatics: Denies new lymphadenopathy or easy bruising Musculoskeletal: (+) arthritis in the right knee.  Neurological:Denies new weaknesses (+) mild numbness in her fingertips, increase in neuropathy in her feet (+) change in balance  Breast: negative  Behavioral/Psych: Mood is stable, no new changes  All other systems were reviewed with the patient and are negative.  PHYSICAL EXAMINATION: ECOG PERFORMANCE STATUS: 1  Vitals:   03/31/17 1009  BP: (!) 150/83  Pulse: 99  Resp: 17  Temp: 98 F (36.7 C)  SpO2: 100%   Filed Weights   03/31/17 1009  Weight: 172 lb 4.8 oz (78.2 kg)     GENERAL:alert, no distress and comfortable SKIN: skin color, texture, turgor are normal, no  rashes  EYES: normal, conjunctiva are pink and non-injected, sclera clear OROPHARYNX:no exudate, no erythema and lips, buccal mucosa, and tongue normal . No obvious mucositis NECK: supple, thyroid normal size, non-tender, without  nodularity LYMPH:  no palpable  Cervical, supraclavicular, or axillary lymphadenopathy LUNGS: clear to auscultation bilaterally with normal breathing effort HEART: regular rate & rhythm and no murmurs and no lower extremity edema ABDOMEN:abdomen soft, non-tender and normal bowel sounds. No palpable hepatomegaly or masses GU/GYN: external exam reveals healing herpetic lesions to left labia minora Musculoskeletal:no cyanosis of digits and no clubbing  PSYCH: alert & oriented x 3 with fluent speech NEURO: no focal motor (+) mild sensory deficit in fingers, L>R Breasts: Breast inspection showed them to be symmetrical with no nipple discharge. (+) S/p left mastectomy with tissue expander placement with incision well-healed, no tenderness.   LABORATORY DATA:  I have reviewed the data as listed CBC Latest Ref Rng & Units 03/31/2017 03/24/2017 03/17/2017  WBC 3.9 - 10.3 10e3/uL 4.1 2.0(L) 2.0(L)  Hemoglobin 11.6 - 15.9 g/dL 11.0(L) 10.0(L) 10.0(L)  Hematocrit 34.8 - 46.6 % 33.0(L) 31.4(L) 30.6(L)  Platelets 145 - 400 10e3/uL 283 261 242    CMP Latest Ref Rng & Units 03/31/2017 03/24/2017 03/17/2017  Glucose 70 - 140 mg/dl 127 128 96  BUN 7.0 - 26.0 mg/dL 11.1 7.5 7.4  Creatinine 0.6 - 1.1 mg/dL 0.7 0.7 0.7  Sodium 136 - 145 mEq/L 139 139 140  Potassium 3.5 - 5.1 mEq/L 3.8 4.0 4.2  Chloride 101 - 111 mmol/L - - -  CO2 22 - 29 mEq/L '25 25 26  ' Calcium 8.4 - 10.4 mg/dL 9.4 9.4 9.1  Total Protein 6.4 - 8.3 g/dL 6.9 6.4 6.6  Total Bilirubin 0.20 - 1.20 mg/dL 1.04 0.91 1.02  Alkaline Phos 40 - 150 U/L 68 56 58  AST 5 - 34 U/L '20 22 19  ' ALT 0 - 55 U/L '20 22 18    ' PATHOLOGY:   Surgery Diagnosis 10/13/16  1. Lymph node, sentinel, biopsy, Left Axillary - METASTATIC CARCINOMA IN ONE LYMPH NODE (1/1). 2. Lymph node, sentinel, biopsy, Left - METASTATIC CARCINOMA IN ONE LYMPH NODE (1/1). 3. Breast, radical mastectomy (including lymph nodes), Left - INVASIVE DUCTAL CARCINOMA, 0.3 CM. - LYMPHOVASCULAR  INVOLVEMENT BY CARCINOMA. - HIGH GRADE DUCTAL CARCINOMA IN SITU WITH CALCIFICATIONS AND NECROSIS, 5.5 CM. - DCIS FOCALLY 0.1 CM FROM CAUTERIZED MEDIAL MARGIN. - SEE ONCOLOGY TABLE AND COMMENT. 4. Skin , Left additional mastectomy flap - BENIGN FIBROADIPOSE TISSUE. - NO EVIDENCE OF MALIGNANCY. Microscopic Comment 3. BREAST, INVASIVE TUMOR Procedure: Mastectomy and two sentinel lymph nodes with additional left mastectomy flap. Laterality: Left breast Tumor Size: 0.3 cm invasive carcinoma; 5.5 cm DCIS Histologic Type: Ductal Grade: II Tubular Differentiation: 2 Nuclear Pleomorphism: 2 Mitotic Count: 2 Ductal Carcinoma in Situ (DCIS): Present, high grade with necrosis Extent of Tumor: Skin: Free of tumor Nipple: Free of tumor Microscopic Comment(continued) Skeletal muscle: Free of tumor Margins: Free of tumor Invasive carcinoma, distance from closest margin: Greater than 1.5 cm DCIS, distance from closest margin: Less than 0.1 cm from medial margin Regional Lymph Nodes: Number of Lymph Nodes Examined: 2 Number of Sentinel Lymph Nodes Examined: 2 Lymph Nodes with Macrometastases: 2 Lymph Nodes with Micrometastases: 0 Lymph Nodes with Isolated Tumor Cells: 0 Breast Prognostic Profile: Pending Estrogen Receptor: Pending Progesterone Receptor: Pending Her2: Pending Ki-67: Pending Best tumor block for sendout testing: 3[facility] Pathologic Stage Classification (pTNM, AJCC 8th Edition): Primary Tumor (pT): pT1a Regional Lymph  Nodes (pN): pN1a Distant Metastases (pM): pMX Comments: There is extensive high grade ductal carcinoma in situ with necrosis. Multiple additional portions of the tissue are examined and there is a 0.3 cm invasive ductal carcinoma associated with lymphovascular involvement by tumor. Breast prognostic profile will be performed on the invasive carcinoma and reported as an addendum. Immunohistochemistry shows positive basal cell staining in the DCIS with calponin, smooth  muscle myosin and p63. ADDITIONAL INFORMATION: 3. By immunohistochemistry, the tumor cells are Negative for Her2 (1+). Vicente Males MD Pathologist, Electronic Signature ( Signed 10/21/2016) 3. PROGNOSTIC INDICATORS For Part 3[facility] Results: IMMUNOHISTOCHEMICAL AND MORPHOMETRIC ANALYSIS PERFORMED MANUALLY Estrogen Receptor: 95%, POSITIVE, STRONG STAINING INTENSITY Progesterone Receptor: 45%, POSITIVE, STRONG STAINING INTENSITY Proliferation Marker Ki67: 15% ADDITIONAL INFORMATION:(continued) Results: HER2 - *EQUIVOCAL*. OF NOTE, A TOTAL OF 40 TUMOR CELLS WERE EVALUATED FOR HER2 EXPRESSION. HER2 BY IMMUNOHISTOCHEMISTRY WILL BE PERFORMED AND THE RESULTS REPORTED SEPARATELY. RATIO OF HER2/CEP17 SIGNALS 1.67 AVERAGE HER2 COPY NUMBER PER CELL 4.18   Surgery: 07/10/16 Diagnosis 1. 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 - SEE MICROSCOPIC DESCRIPTION. 2. 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 - SEE MICROSCOPIC DESCRIPTION. Microscopic Comment 1. Prognostic markers have been ordered and will be reported in an addendum. 2. Prognostic markers have been ordered and will be reported in an addendum. Called to the [facility] on 07/13/16. Results: IMMUNOHISTOCHEMICAL AND MORPHOMETRIC ANALYSIS PERFORMED MANUALLY Estrogen Receptor: 95%, POSITIVE, STRONG STAINING INTENSITY Progesterone Receptor: 80%, POSITIVE, STRONG STAINING INTENSITY  Biopsy: 02/25/15 Diagnosis 1. 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 - NO ATYPIA OR MALIGNANCY IDENTIFIED. - SEE COMMENT. 2. Lymph node, biopsy, 12 L - ONE BENIGN LYMPH NODE WITH NO TUMOR SEEN  (0/1). 3. Lymph node, biopsy, 9 L - ONE BENIGN LYMPH NODE WITH NO TUMOR SEEN (0/1). Microscopic Comment 1. A Congo Red stain is performed and a crystal violet stain is performed on the lung specimen. The staining pattern (apple green birefringence) coupled with the morphology is consistent with amyloid deposition. Dr. Zenda Alpers, Dr. Fletcher Anon and Dr. Darl Householder have all seen this case in consultation with agreement that amyloid deposition is present. Dr. Donato Heinz has also seen this case in consultation with agreement of the additional findings in the case.   MammaPrint 10/31/26 Result:  High Risk at 94.6% MPI: -0.368 Average 10-year risk of recurrence untreated: 29%   Genetics 07/29/16    PROCEDURES  ECHO 11/17/16 Study Conclusions - Left ventricle: The cavity size was normal. Wall thickness was   normal. Systolic function was normal. The estimated ejection   fraction was in the range of 60% to 65%. Wall motion was normal;   there were no regional wall motion abnormalities. Features are   consistent with a pseudonormal left ventricular filling pattern,   with concomitant abnormal relaxation and increased filling   pressure (grade 2 diastolic dysfunction). - Aortic valve: There was mild regurgitation. Valve area (VTI):   1.84 cm^2. Valve area (Vmax): 1.76 cm^2. Valve area (Vmean): 1.89   cm^2.    RADIOGRAPHIC STUDIES: I have personally reviewed the radiological images as listed and agreed with the findings in the report. No results found.  ASSESSMENT & PLAN:  Taylor Walsh is 61 y.o. who present  to the clinic with Ductal carcinoma in situ (DCIS) of left breast  1. 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 - She had mostly DCIS in her breast but There  was small component of invasive cancer that has spread to 2 of her lymph nodes.  Her surgical margins were negative. -Her mammaprint showed high-risk disease, luminal type B -Her staging scan was negative for distant metastasis, or residual axillary adenopathy. -She underwent adjuvant dose dense AC every 2 weeks for 4 cycles from 11/25/2016 to 01/05/2017, she tolerated well overall but had neutropenia and required IV hydration -She started weekly Taxol for 12 weeks on 01/20/2017 and plan to complete on 04/07/16. The course of therapy is curative.  -She plans to do breast reconstruction after Taxol and radiation -She has been tolerating Taxol well, will continue.  She has developed neutropenia, mild neuropathy and hyperpigmentation to her hands and feet. I suggest continue salt wash and mouth wash daily.  -Due to neutropenia I added Granix injections starting with cycle 7  -I suggest she take 1-2 tabs of Calcium and at least 1 tab of Vitamin D daily. She is fine to continue Aspirin. I suggest she hold her HTN medication for now. If her BP increases to 140 or above she will restart.  -Given her increase in mild neuropathy I will further decrease her taxol to 5m/m2. I encouraged her to take Vitamin B12 complex or B6 to help.  -CBC reviewed, WBC and ANC improved and normalized. Hg improved to 11. Labs adequate to proceed with cycle 11 Taxol today.  Due to her persistent neuropathy, I will reduce her dose to 60 mg/m today and next week. -She has 2 more cycles of Taxol. Afterward she will proceed with adjuvant radiation. I will send rad onc referral to Dr. SIsidore Moos  -I discussed after radiation her plan to start antiestrogen pill and she will be followed with close surveillance. We do not plan to do another whole body scan unless concerning symptoms are present.   -Her BP has increased again to 150/83. She is fine to restart HTN medication.  -F/u in 4 weeks    2. Arthritis -She experiences mild arthritis  in LE, more specific to her right knee -She is pretty active   3. Osteopenia  -Last bone density scan ordered by Dr SBrigitte Pulsein the last 2 years -Will f/u with PCP and scans every 2 years  4. Smoking cessation -patient stopped smoking but after close friend passing she has started again smoking.  -She smokes approximately 1 pack a week.  -The patient quit smoking in 07/2016 -She does have cough that persist depending on the weather. I refilled her Hycodan (03/03/17)  5. 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 The Hereditary Gene Panel offered by [facility] includes sequencing and/or deletion duplication testing of the following 46 genes: APC, ATM, AXIN2, BARD1, BMPR1A, BRCA1, BRCA2, BRIP1, CDH1, CDKN2A (p14ARF), CDKN2A (p16INK4a), CHEK2, CTNNA1, DICER1, EPCAM (Deletion/duplication testing only), GREM1 (promoter region deletion/duplication testing only), KIT, MEN1, MLH1, MSH2, MSH3, MSH6, MUTYH, NBN, NF1, NHTL1, PALB2, PDGFRA, PMS2, POLD1, POLE, PTEN, RAD50, RAD51C, RAD51D, SDHB, SDHC, SDHD, SMAD4, SMARCA4. STK11, TP53, TSC1, TSC2, and VHL.  The following gene was evaluated for sequence changes only: SDHA and HOXB13 c.251G>A variant only.  The  report date is Aug 09, 2016.  6. 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 History of pulmonary amyloidosis -She was found to have a lung nodule in October 2016, which was surgically removed, and showed pulmonary amyloidosis -Previous lab showed no evidence of systemic amyloidosis -Continue monitoring  8.  Neutropenia -secondary to chemotherapy -mild, will continue monitoring. -if ANCA below 1.0, will consider Granix -Her ANC has dropped to 1.1 and WBC 1.8, started Granix with cycle 7.  -I reminded her to take  claritin x5 days after  9.  Therapy induced hyperglycemia -Continue metformin, random sugar has been normal -Previously discussed diabetic diet and exercise -BG controlled, normal at 106 as of 03/03/17    10. Hyperpigmentation to hands and feet -secondary to chemotherapy -We discussed this will likely resolve slowly over time after completion of chemo.   11. Mild Neuropathy -If her neuropathy progresses I may need to reduce her taxol.  -She has mildly decreased vibratory sense to feet bilaterally, but improvement in her vibratory sense in her hands from last cycle. All functional abilities are preserved. Will continue to monitor.  -Neuropathy more present in feet than fingers. Will decrease Taxol to 73m/m2 on 03/31/16 for the last two weeks of treatment   -I encouraged her to take vitamin B12 or B6.   PLAN:  -BP at 150/83. She is fine to restart HTN medication  -Labs reviewed and adequate to proceed with Taxol today, will reduce dose to 6104mm2 given her neuropathy.  -She will complete adjuvant chemotherapy next week -Lab, flush and f/u in 5 weeks  -Rad/onc referral   All questions were answered. The patient knows to call the clinic with any problems, questions or concerns.     YaTruitt MerleMD 03/31/2017   This document serves as a record of services personally performed by YaTruitt MerleMD. It was created on her behalf by AmJoslyn Devona trained medical scribe. The creation of this record is based on the scribe's personal observations and the provider's statements to them.    I have reviewed the above documentation for accuracy and completeness, and I agree with the above.

## 2017-03-31 ENCOUNTER — Encounter: Payer: Self-pay | Admitting: Hematology

## 2017-03-31 ENCOUNTER — Other Ambulatory Visit (HOSPITAL_BASED_OUTPATIENT_CLINIC_OR_DEPARTMENT_OTHER): Payer: BC Managed Care – PPO

## 2017-03-31 ENCOUNTER — Ambulatory Visit (HOSPITAL_BASED_OUTPATIENT_CLINIC_OR_DEPARTMENT_OTHER): Payer: BC Managed Care – PPO

## 2017-03-31 ENCOUNTER — Ambulatory Visit (HOSPITAL_BASED_OUTPATIENT_CLINIC_OR_DEPARTMENT_OTHER): Payer: BC Managed Care – PPO | Admitting: Hematology

## 2017-03-31 ENCOUNTER — Other Ambulatory Visit: Payer: Self-pay | Admitting: *Deleted

## 2017-03-31 ENCOUNTER — Telehealth: Payer: Self-pay | Admitting: Hematology

## 2017-03-31 ENCOUNTER — Ambulatory Visit: Payer: BC Managed Care – PPO

## 2017-03-31 VITALS — BP 150/83 | HR 99 | Temp 98.0°F | Resp 17 | Wt 172.3 lb

## 2017-03-31 DIAGNOSIS — Z5111 Encounter for antineoplastic chemotherapy: Secondary | ICD-10-CM | POA: Diagnosis not present

## 2017-03-31 DIAGNOSIS — Z170 Estrogen receptor positive status [ER+]: Principal | ICD-10-CM

## 2017-03-31 DIAGNOSIS — D630 Anemia in neoplastic disease: Secondary | ICD-10-CM

## 2017-03-31 DIAGNOSIS — C50212 Malignant neoplasm of upper-inner quadrant of left female breast: Secondary | ICD-10-CM

## 2017-03-31 DIAGNOSIS — C773 Secondary and unspecified malignant neoplasm of axilla and upper limb lymph nodes: Secondary | ICD-10-CM

## 2017-03-31 DIAGNOSIS — Z95828 Presence of other vascular implants and grafts: Secondary | ICD-10-CM

## 2017-03-31 DIAGNOSIS — D701 Agranulocytosis secondary to cancer chemotherapy: Secondary | ICD-10-CM

## 2017-03-31 DIAGNOSIS — M8580 Other specified disorders of bone density and structure, unspecified site: Secondary | ICD-10-CM

## 2017-03-31 DIAGNOSIS — T451X5A Adverse effect of antineoplastic and immunosuppressive drugs, initial encounter: Secondary | ICD-10-CM

## 2017-03-31 DIAGNOSIS — D6481 Anemia due to antineoplastic chemotherapy: Secondary | ICD-10-CM

## 2017-03-31 LAB — CBC WITH DIFFERENTIAL/PLATELET
BASO%: 0.8 % (ref 0.0–2.0)
BASOS ABS: 0 10*3/uL (ref 0.0–0.1)
EOS ABS: 0 10*3/uL (ref 0.0–0.5)
EOS%: 1 % (ref 0.0–7.0)
HEMATOCRIT: 33 % — AB (ref 34.8–46.6)
HEMOGLOBIN: 11 g/dL — AB (ref 11.6–15.9)
LYMPH%: 21.5 % (ref 14.0–49.7)
MCH: 31.5 pg (ref 25.1–34.0)
MCHC: 33.2 g/dL (ref 31.5–36.0)
MCV: 94.7 fL (ref 79.5–101.0)
MONO#: 0.9 10*3/uL (ref 0.1–0.9)
MONO%: 21.7 % — ABNORMAL HIGH (ref 0.0–14.0)
NEUT%: 55 % (ref 38.4–76.8)
NEUTROS ABS: 2.3 10*3/uL (ref 1.5–6.5)
PLATELETS: 283 10*3/uL (ref 145–400)
RBC: 3.49 10*6/uL — ABNORMAL LOW (ref 3.70–5.45)
RDW: 19.9 % — AB (ref 11.2–14.5)
WBC: 4.1 10*3/uL (ref 3.9–10.3)
lymph#: 0.9 10*3/uL (ref 0.9–3.3)

## 2017-03-31 LAB — COMPREHENSIVE METABOLIC PANEL
ALT: 20 U/L (ref 0–55)
ANION GAP: 9 meq/L (ref 3–11)
AST: 20 U/L (ref 5–34)
Albumin: 3.9 g/dL (ref 3.5–5.0)
Alkaline Phosphatase: 68 U/L (ref 40–150)
BUN: 11.1 mg/dL (ref 7.0–26.0)
CALCIUM: 9.4 mg/dL (ref 8.4–10.4)
CHLORIDE: 105 meq/L (ref 98–109)
CO2: 25 meq/L (ref 22–29)
Creatinine: 0.7 mg/dL (ref 0.6–1.1)
EGFR: >60 mL/min/{1.73_m2} (ref >60)
Glucose: 127 mg/dl (ref 70–140)
POTASSIUM: 3.8 meq/L (ref 3.5–5.1)
Sodium: 139 mEq/L (ref 136–145)
Total Bilirubin: 1.04 mg/dL (ref 0.20–1.20)
Total Protein: 6.9 g/dL (ref 6.4–8.3)

## 2017-03-31 MED ORDER — DIPHENHYDRAMINE HCL 50 MG/ML IJ SOLN
INTRAMUSCULAR | Status: AC
  Filled 2017-03-31: qty 1

## 2017-03-31 MED ORDER — DIPHENHYDRAMINE HCL 50 MG/ML IJ SOLN
25.0000 mg | Freq: Once | INTRAMUSCULAR | Status: AC
  Administered 2017-03-31: 25 mg via INTRAVENOUS

## 2017-03-31 MED ORDER — FAMOTIDINE IN NACL 20-0.9 MG/50ML-% IV SOLN
INTRAVENOUS | Status: AC
  Filled 2017-03-31: qty 50

## 2017-03-31 MED ORDER — FAMOTIDINE IN NACL 20-0.9 MG/50ML-% IV SOLN
20.0000 mg | Freq: Once | INTRAVENOUS | Status: AC
  Administered 2017-03-31: 20 mg via INTRAVENOUS

## 2017-03-31 MED ORDER — SODIUM CHLORIDE 0.9 % IV SOLN
60.0000 mg/m2 | Freq: Once | INTRAVENOUS | Status: AC
  Administered 2017-03-31: 120 mg via INTRAVENOUS
  Filled 2017-03-31: qty 20

## 2017-03-31 MED ORDER — SODIUM CHLORIDE 0.9 % IV SOLN
Freq: Once | INTRAVENOUS | Status: AC
  Administered 2017-03-31: 11:00:00 via INTRAVENOUS

## 2017-03-31 MED ORDER — DEXAMETHASONE SODIUM PHOSPHATE 10 MG/ML IJ SOLN
INTRAMUSCULAR | Status: AC
  Filled 2017-03-31: qty 1

## 2017-03-31 MED ORDER — HEPARIN SOD (PORK) LOCK FLUSH 100 UNIT/ML IV SOLN
500.0000 [IU] | Freq: Once | INTRAVENOUS | Status: AC | PRN
  Administered 2017-03-31: 500 [IU]
  Filled 2017-03-31: qty 5

## 2017-03-31 MED ORDER — SODIUM CHLORIDE 0.9% FLUSH
10.0000 mL | INTRAVENOUS | Status: DC | PRN
  Administered 2017-03-31: 10 mL
  Filled 2017-03-31: qty 10

## 2017-03-31 MED ORDER — SODIUM CHLORIDE 0.9% FLUSH
10.0000 mL | Freq: Once | INTRAVENOUS | Status: AC
  Administered 2017-03-31: 10 mL
  Filled 2017-03-31: qty 10

## 2017-03-31 MED ORDER — DEXAMETHASONE SODIUM PHOSPHATE 10 MG/ML IJ SOLN
10.0000 mg | Freq: Once | INTRAMUSCULAR | Status: AC
  Administered 2017-03-31: 10 mg via INTRAVENOUS

## 2017-03-31 NOTE — Patient Instructions (Signed)
[facility] Discharge Instructions for Patients Receiving Chemotherapy  Today you received the following chemotherapy agents: Taxol.  To help prevent nausea and vomiting after your treatment, we encourage you to take your nausea medication: Compazine. Take one every 6 hours as needed.   If you develop nausea and vomiting that is not controlled by your nausea medication, call the clinic.   BELOW ARE SYMPTOMS THAT SHOULD BE REPORTED IMMEDIATELY:  *FEVER GREATER THAN 100.5 F  *CHILLS WITH OR WITHOUT FEVER  NAUSEA AND VOMITING THAT IS NOT CONTROLLED WITH YOUR NAUSEA MEDICATION  *UNUSUAL SHORTNESS OF BREATH  *UNUSUAL BRUISING OR BLEEDING  TENDERNESS IN MOUTH AND THROAT WITH OR WITHOUT PRESENCE OF ULCERS  *URINARY PROBLEMS  *BOWEL PROBLEMS  UNUSUAL RASH Items with * indicate a potential emergency and should be followed up as soon as possible.  Feel free to call the clinic should you have any questions or concerns. The clinic phone number is [phone].  Please show the CHEMO ALERT CARD at check-in to the Emergency Department and triage nurse.   

## 2017-03-31 NOTE — Telephone Encounter (Signed)
Appointments completed per 1/2 los. Patient aware and will get print out in infusion area. Radonc will contact patient re appointment.

## 2017-04-01 ENCOUNTER — Ambulatory Visit (HOSPITAL_BASED_OUTPATIENT_CLINIC_OR_DEPARTMENT_OTHER): Payer: BC Managed Care – PPO

## 2017-04-01 VITALS — BP 123/63 | HR 99 | Temp 98.3°F | Resp 16

## 2017-04-01 DIAGNOSIS — Z170 Estrogen receptor positive status [ER+]: Secondary | ICD-10-CM

## 2017-04-01 DIAGNOSIS — C50212 Malignant neoplasm of upper-inner quadrant of left female breast: Secondary | ICD-10-CM | POA: Diagnosis not present

## 2017-04-01 DIAGNOSIS — D701 Agranulocytosis secondary to cancer chemotherapy: Secondary | ICD-10-CM

## 2017-04-01 DIAGNOSIS — Z95828 Presence of other vascular implants and grafts: Secondary | ICD-10-CM

## 2017-04-01 MED ORDER — TBO-FILGRASTIM 480 MCG/0.8ML ~~LOC~~ SOSY
480.0000 ug | PREFILLED_SYRINGE | Freq: Once | SUBCUTANEOUS | Status: AC
  Administered 2017-04-01: 480 ug via SUBCUTANEOUS

## 2017-04-01 MED ORDER — TBO-FILGRASTIM 480 MCG/0.8ML ~~LOC~~ SOSY
PREFILLED_SYRINGE | SUBCUTANEOUS | Status: AC
  Filled 2017-04-01: qty 0.8

## 2017-04-01 NOTE — Progress Notes (Signed)
Location of Breast Cancer: Left Breast  Histology per Pathology Report:  07/10/16 Diagnosis 1. 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 - SEE MICROSCOPIC DESCRIPTION.  Receptor Status: ER (95%), PR (80%).  2. 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  Receptor Status: ER(95%), PR (90%)  10/13/16 Diagnosis 1. Lymph node, sentinel, biopsy, Left Axillary - METASTATIC CARCINOMA IN ONE LYMPH NODE (1/1). 2. Lymph node, sentinel, biopsy, Left - METASTATIC CARCINOMA IN ONE LYMPH NODE (1/1). 3. Breast, radical mastectomy (including lymph nodes), Left - INVASIVE DUCTAL CARCINOMA, 0.3 CM. - LYMPHOVASCULAR INVOLVEMENT BY CARCINOMA. - HIGH GRADE DUCTAL CARCINOMA IN SITU WITH CALCIFICATIONS AND NECROSIS, 5.5 CM. - DCIS FOCALLY 0.1 CM FROM CAUTERIZED MEDIAL MARGIN. - SEE ONCOLOGY TABLE AND COMMENT. 4. Skin , Left additional mastectomy flap - BENIGN FIBROADIPOSE TISSUE. - NO EVIDENCE OF MALIGNANCY  Did patient present with symptoms or was this found on screening mammography?: It was found on a screening mammogram.   Past/Anticipated interventions by surgeon, if any: 10/13/16 Procedure(s): LEFT TOTAL MASTECTOMY WITH LEFT DEEP AXILLARY SENTINEL LYMPH NODE BIOPSY LEFT BREAST RECONSTRUCTION WITH PLACEMENT OF TISSUE EXPANDER AND ALLODERM  Surgeon(s): Coralie Keens, MD Irene Limbo, MD   Past/Anticipated interventions by medical oncology, if any:  03/31/17 Dr. Burr Medico She underwent adjuvant dose dense AC every 2 weeks for 4 cycles from 11/25/2016 to 01/05/2017, she tolerated well overall but had neutropenia and required IV hydration -She started weekly Taxol for 12 weeks on 01/20/2017 and plan to complete on 04/07/16. The course of therapy is curative.  -She plans  to do breast reconstruction after Taxol and radiation -I discussed after radiation her plan to start antiestrogen pill and she will be followed with close surveillance. We do not plan to do another whole body scan unless concerning symptoms are present.    Lymphedema issues, if any:  She denies. She has good arm mobility. She was seeing PT and was released with exercises to perform.   Pain issues, if any: She has pain to her bilateral hands from neuropathy related to chemotherapy.    SAFETY ISSUES:  Prior radiation? She received a radiation pill for her thyroid in approximately 2011.  Pacemaker/ICD? No  Possible current pregnancy? No  Is the patient on methotrexate? No  Current Complaints / other details:    BP (!) 101/54   Pulse 97   Temp 98.2 F (36.8 C)   Ht 5\' 3"  (1.6 m)   Wt 172 lb 12.8 oz (78.4 kg)   SpO2 100% Comment: room air  BMI 30.61 kg/m    Wt Readings from Last 3 Encounters:  04/09/17 172 lb 12.8 oz (78.4 kg)  04/07/17 173 lb (78.5 kg)  03/31/17 172 lb 4.8 oz (78.2 kg)    .

## 2017-04-01 NOTE — Patient Instructions (Signed)
Tbo-Filgrastim injection What is this medicine? TBO-FILGRASTIM (T B O fil GRA stim) is a granulocyte colony-stimulating factor that stimulates the growth of neutrophils, a type of white blood cell important in the body's fight against infection. It is used to reduce the incidence of fever and infection in patients with certain types of cancer who are receiving chemotherapy that affects the bone marrow. This medicine may be used for other purposes; ask your health care provider or pharmacist if you have questions. COMMON BRAND NAME(S): Granix What should I tell my health care provider before I take this medicine? They need to know if you have any of these conditions: -bone scan or tests planned -kidney disease -sickle cell anemia -an unusual or allergic reaction to tbo-filgrastim, filgrastim, pegfilgrastim, other medicines, foods, dyes, or preservatives -pregnant or trying to get pregnant -breast-feeding How should I use this medicine? This medicine is for injection under the skin. If you get this medicine at home, you will be taught how to prepare and give this medicine. Refer to the Instructions for Use that come with your medication packaging. Use exactly as directed. Take your medicine at regular intervals. Do not take your medicine more often than directed. It is important that you put your used needles and syringes in a special sharps container. Do not put them in a trash can. If you do not have a sharps container, call your pharmacist or healthcare provider to get one. Talk to your pediatrician regarding the use of this medicine in children. Special care may be needed. Overdosage: If you think you have taken too much of this medicine contact a poison control center or emergency room at once. NOTE: This medicine is only for you. Do not share this medicine with others. What if I miss a dose? It is important not to miss your dose. Call your doctor or health care professional if you miss a  dose. What may interact with this medicine? This medicine may interact with the following medications: -medicines that may cause a release of neutrophils, such as lithium This list may not describe all possible interactions. Give your health care provider a list of all the medicines, herbs, non-prescription drugs, or dietary supplements you use. Also tell them if you smoke, drink alcohol, or use illegal drugs. Some items may interact with your medicine. What should I watch for while using this medicine? You may need blood work done while you are taking this medicine. What side effects may I notice from receiving this medicine? Side effects that you should report to your doctor or health care professional as soon as possible: -allergic reactions like skin rash, itching or hives, swelling of the face, lips, or tongue -blood in the urine -dark urine -dizziness -fast heartbeat -feeling faint -shortness of breath or breathing problems -signs and symptoms of infection like fever or chills; cough; or sore throat -signs and symptoms of kidney injury like trouble passing urine or change in the amount of urine -stomach or side pain, or pain at the shoulder -sweating -swelling of the legs, ankles, or abdomen -tiredness Side effects that usually do not require medical attention (report to your doctor or health care professional if they continue or are bothersome): -bone pain -headache -muscle pain -vomiting This list may not describe all possible side effects. Call your doctor for medical advice about side effects. You may report side effects to FDA at 1-800-FDA-1088. Where should I keep my medicine? Keep out of the reach of children. Store in a refrigerator between   2 and 8 degrees C (36 and 46 degrees F). Keep in carton to protect from light. Throw away this medicine if it is left out of the refrigerator for more than 5 consecutive days. Throw away any unused medicine after the expiration  date. NOTE: This sheet is a summary. It may not cover all possible information. If you have questions about this medicine, talk to your doctor, pharmacist, or health care provider.  2018 [employer]vier/Gold Standard (2015-05-06 19:07:04)  

## 2017-04-07 ENCOUNTER — Inpatient Hospital Stay: Payer: BC Managed Care – PPO

## 2017-04-07 ENCOUNTER — Inpatient Hospital Stay: Payer: BC Managed Care – PPO | Attending: Hematology

## 2017-04-07 ENCOUNTER — Other Ambulatory Visit: Payer: Self-pay | Admitting: *Deleted

## 2017-04-07 VITALS — BP 112/63 | HR 93 | Temp 98.3°F | Resp 17 | Wt 173.0 lb

## 2017-04-07 DIAGNOSIS — L03012 Cellulitis of left finger: Secondary | ICD-10-CM | POA: Insufficient documentation

## 2017-04-07 DIAGNOSIS — Z5111 Encounter for antineoplastic chemotherapy: Secondary | ICD-10-CM | POA: Insufficient documentation

## 2017-04-07 DIAGNOSIS — D701 Agranulocytosis secondary to cancer chemotherapy: Secondary | ICD-10-CM | POA: Diagnosis not present

## 2017-04-07 DIAGNOSIS — Z170 Estrogen receptor positive status [ER+]: Secondary | ICD-10-CM

## 2017-04-07 DIAGNOSIS — Z95828 Presence of other vascular implants and grafts: Secondary | ICD-10-CM

## 2017-04-07 DIAGNOSIS — C50212 Malignant neoplasm of upper-inner quadrant of left female breast: Secondary | ICD-10-CM

## 2017-04-07 DIAGNOSIS — H6121 Impacted cerumen, right ear: Secondary | ICD-10-CM | POA: Insufficient documentation

## 2017-04-07 DIAGNOSIS — G620 Drug-induced polyneuropathy: Secondary | ICD-10-CM | POA: Insufficient documentation

## 2017-04-07 LAB — CBC WITH DIFFERENTIAL/PLATELET
ABS GRANULOCYTE: 2.6 10*3/uL (ref 1.5–6.5)
BASOS PCT: 1 %
Basophils Absolute: 0 10*3/uL (ref 0.0–0.1)
Eosinophils Absolute: 0 10*3/uL (ref 0.0–0.5)
Eosinophils Relative: 1 %
HEMATOCRIT: 35.6 % (ref 34.8–46.6)
HEMOGLOBIN: 11.1 g/dL — AB (ref 11.6–15.9)
LYMPHS ABS: 0.9 10*3/uL (ref 0.9–3.3)
LYMPHS PCT: 22 %
MCH: 30.3 pg (ref 25.1–34.0)
MCHC: 31.2 g/dL — AB (ref 31.5–36.0)
MCV: 97.3 fL (ref 79.5–101.0)
MONO ABS: 0.5 10*3/uL (ref 0.1–0.9)
MONOS PCT: 13 %
NEUTROS ABS: 2.6 10*3/uL (ref 1.5–6.5)
Neutrophils Relative %: 63 %
Platelets: 299 10*3/uL (ref 145–400)
RBC: 3.66 MIL/uL — ABNORMAL LOW (ref 3.70–5.45)
RDW: 17.1 % — AB (ref 11.2–16.1)
WBC: 4.1 10*3/uL (ref 3.9–10.3)

## 2017-04-07 LAB — COMPREHENSIVE METABOLIC PANEL
ALK PHOS: 67 U/L (ref 40–150)
ALT: 15 U/L (ref 0–55)
ANION GAP: 10 (ref 3–11)
AST: 18 U/L (ref 5–34)
Albumin: 3.6 g/dL (ref 3.5–5.0)
BILIRUBIN TOTAL: 0.8 mg/dL (ref 0.2–1.2)
BUN: 7 mg/dL (ref 7–26)
CALCIUM: 9.7 mg/dL (ref 8.4–10.4)
CO2: 24 mmol/L (ref 22–29)
Chloride: 103 mmol/L (ref 98–109)
Creatinine, Ser: 0.76 mg/dL (ref 0.60–1.10)
GLUCOSE: 157 mg/dL — AB (ref 70–140)
POTASSIUM: 4.2 mmol/L (ref 3.3–4.7)
Sodium: 137 mmol/L (ref 136–145)
TOTAL PROTEIN: 6.6 g/dL (ref 6.4–8.3)

## 2017-04-07 MED ORDER — FAMOTIDINE IN NACL 20-0.9 MG/50ML-% IV SOLN
20.0000 mg | Freq: Once | INTRAVENOUS | Status: AC
  Administered 2017-04-07: 20 mg via INTRAVENOUS

## 2017-04-07 MED ORDER — SODIUM CHLORIDE 0.9 % IV SOLN
60.0000 mg/m2 | Freq: Once | INTRAVENOUS | Status: AC
  Administered 2017-04-07: 120 mg via INTRAVENOUS
  Filled 2017-04-07: qty 20

## 2017-04-07 MED ORDER — SODIUM CHLORIDE 0.9% FLUSH
10.0000 mL | INTRAVENOUS | Status: DC | PRN
  Administered 2017-04-07: 10 mL
  Filled 2017-04-07: qty 10

## 2017-04-07 MED ORDER — DIPHENHYDRAMINE HCL 50 MG/ML IJ SOLN
INTRAMUSCULAR | Status: AC
  Filled 2017-04-07: qty 1

## 2017-04-07 MED ORDER — HYDROCODONE-HOMATROPINE 5-1.5 MG/5ML PO SYRP: 5.0000 mL | ORAL_SOLUTION | Freq: Four times a day (QID) | ORAL | 0 refills | Status: DC | PRN

## 2017-04-07 MED ORDER — FAMOTIDINE IN NACL 20-0.9 MG/50ML-% IV SOLN
INTRAVENOUS | Status: AC
  Filled 2017-04-07: qty 50

## 2017-04-07 MED ORDER — SODIUM CHLORIDE 0.9% FLUSH
10.0000 mL | Freq: Once | INTRAVENOUS | Status: AC
  Administered 2017-04-07: 10 mL
  Filled 2017-04-07: qty 10

## 2017-04-07 MED ORDER — SODIUM CHLORIDE 0.9 % IV SOLN
Freq: Once | INTRAVENOUS | Status: AC
  Administered 2017-04-07: 10:00:00 via INTRAVENOUS

## 2017-04-07 MED ORDER — HEPARIN SOD (PORK) LOCK FLUSH 100 UNIT/ML IV SOLN
500.0000 [IU] | Freq: Once | INTRAVENOUS | Status: AC | PRN
  Administered 2017-04-07: 500 [IU]
  Filled 2017-04-07: qty 5

## 2017-04-07 MED ORDER — DIPHENHYDRAMINE HCL 50 MG/ML IJ SOLN
25.0000 mg | Freq: Once | INTRAMUSCULAR | Status: AC
  Administered 2017-04-07: 25 mg via INTRAVENOUS

## 2017-04-07 MED ORDER — DEXAMETHASONE SODIUM PHOSPHATE 10 MG/ML IJ SOLN
INTRAMUSCULAR | Status: AC
  Filled 2017-04-07: qty 1

## 2017-04-07 MED ORDER — DEXAMETHASONE SODIUM PHOSPHATE 10 MG/ML IJ SOLN
10.0000 mg | Freq: Once | INTRAMUSCULAR | Status: AC
  Administered 2017-04-07: 10 mg via INTRAVENOUS

## 2017-04-07 NOTE — Patient Instructions (Signed)
[facility] Discharge Instructions for Patients Receiving Chemotherapy  Today you received the following chemotherapy agents: Taxol.  To help prevent nausea and vomiting after your treatment, we encourage you to take your nausea medication: Compazine. Take one every 6 hours as needed.   If you develop nausea and vomiting that is not controlled by your nausea medication, call the clinic.   BELOW ARE SYMPTOMS THAT SHOULD BE REPORTED IMMEDIATELY:  *FEVER GREATER THAN 100.5 F  *CHILLS WITH OR WITHOUT FEVER  NAUSEA AND VOMITING THAT IS NOT CONTROLLED WITH YOUR NAUSEA MEDICATION  *UNUSUAL SHORTNESS OF BREATH  *UNUSUAL BRUISING OR BLEEDING  TENDERNESS IN MOUTH AND THROAT WITH OR WITHOUT PRESENCE OF ULCERS  *URINARY PROBLEMS  *BOWEL PROBLEMS  UNUSUAL RASH Items with * indicate a potential emergency and should be followed up as soon as possible.  Feel free to call the clinic should you have any questions or concerns. The clinic phone number is [phone].  Please show the CHEMO ALERT CARD at check-in to the Emergency Department and triage nurse.   

## 2017-04-08 ENCOUNTER — Inpatient Hospital Stay: Payer: BC Managed Care – PPO

## 2017-04-08 VITALS — BP 124/62 | HR 92 | Temp 98.3°F | Resp 17

## 2017-04-08 DIAGNOSIS — C50212 Malignant neoplasm of upper-inner quadrant of left female breast: Secondary | ICD-10-CM

## 2017-04-08 DIAGNOSIS — Z170 Estrogen receptor positive status [ER+]: Secondary | ICD-10-CM

## 2017-04-08 DIAGNOSIS — Z5111 Encounter for antineoplastic chemotherapy: Secondary | ICD-10-CM | POA: Diagnosis not present

## 2017-04-08 DIAGNOSIS — Z95828 Presence of other vascular implants and grafts: Secondary | ICD-10-CM

## 2017-04-08 MED ORDER — TBO-FILGRASTIM 480 MCG/0.8ML ~~LOC~~ SOSY
480.0000 ug | PREFILLED_SYRINGE | Freq: Once | SUBCUTANEOUS | Status: AC
  Administered 2017-04-08: 480 ug via SUBCUTANEOUS

## 2017-04-08 MED ORDER — TBO-FILGRASTIM 480 MCG/0.8ML ~~LOC~~ SOSY
PREFILLED_SYRINGE | SUBCUTANEOUS | Status: AC
Start: 2017-04-08 — End: 2017-04-08
  Filled 2017-04-08: qty 0.8

## 2017-04-09 ENCOUNTER — Ambulatory Visit
Admission: RE | Admit: 2017-04-09 | Discharge: 2017-04-09 | Disposition: A | Discharge status: Home / Self Care | Payer: BC Managed Care – PPO | Source: Ambulatory Visit | Attending: Radiation Oncology | Admitting: Radiation Oncology

## 2017-04-09 ENCOUNTER — Encounter: Payer: Self-pay | Admitting: Radiation Oncology

## 2017-04-09 VITALS — BP 101/54 | HR 97 | Temp 98.2°F | Ht 63.0 in | Wt 172.8 lb

## 2017-04-09 DIAGNOSIS — Z7982 Long term (current) use of aspirin: Secondary | ICD-10-CM | POA: Diagnosis not present

## 2017-04-09 DIAGNOSIS — Z9221 Personal history of antineoplastic chemotherapy: Secondary | ICD-10-CM | POA: Diagnosis not present

## 2017-04-09 DIAGNOSIS — Z79899 Other long term (current) drug therapy: Secondary | ICD-10-CM | POA: Diagnosis not present

## 2017-04-09 DIAGNOSIS — C50212 Malignant neoplasm of upper-inner quadrant of left female breast: Secondary | ICD-10-CM

## 2017-04-09 DIAGNOSIS — Z87891 Personal history of nicotine dependence: Secondary | ICD-10-CM | POA: Diagnosis not present

## 2017-04-09 DIAGNOSIS — Z9012 Acquired absence of left breast and nipple: Secondary | ICD-10-CM | POA: Diagnosis not present

## 2017-04-09 DIAGNOSIS — C779 Secondary and unspecified malignant neoplasm of lymph node, unspecified: Secondary | ICD-10-CM | POA: Diagnosis not present

## 2017-04-09 DIAGNOSIS — Z170 Estrogen receptor positive status [ER+]: Secondary | ICD-10-CM | POA: Diagnosis not present

## 2017-04-09 DIAGNOSIS — Z510 Encounter for antineoplastic radiation therapy: Secondary | ICD-10-CM | POA: Diagnosis not present

## 2017-04-09 NOTE — Progress Notes (Signed)
Radiation Oncology         [phone] ________________________________  Name: Taylor Walsh MRN: [id number]  Date: 04/09/2017  DOB: May 20, 1955  Follow-Up Visit Note  Outpatient  CC: Marton Redwood, MD  Truitt Merle, MD  Diagnosis:      ICD-10-CM   1. Malignant neoplasm of upper-inner quadrant of left breast in female, estrogen receptor positive [facility]) C50.212    Z17.0   Cancer Staging Breast cancer of upper-inner quadrant of left female breast [facility]) Staging form: Breast, AJCC 8th Edition - Clinical stage from 07/10/2016: Stage 0 (cTis (DCIS), cN0, cM0, G3, ER: Positive, PR: Positive, HER2: Not Assessed) - Signed by Truitt Merle, MD on 07/28/2016 - Pathologic stage from 10/26/2016: Stage IA (pT1a, pN1a, cM0, G2, ER: Positive, PR: Positive, HER2: Negative) - Signed by Truitt Merle, MD on 11/11/2016   Left Breast Invasive ductal carcinoma pathologic Stage (pT1a, pN1a, cM0, Grade 2, ER: positive, PR: positive 95%, HER2: Negative )   CHIEF COMPLAINT: Here to discuss management of left breast cancer.  Narrative:  The patient returns today for follow-up.     Surgical pathology report on 10/13/2016 revealed:  1. Lymph node, sentinel, biopsy, Left Axillary - METASTATIC CARCINOMA IN ONE LYMPH NODE (1/1). 2. Lymph node, sentinel, biopsy, Left - METASTATIC CARCINOMA IN ONE LYMPH NODE (1/1). 3. Breast, radical mastectomy (including lymph nodes), Left - INVASIVE DUCTAL CARCINOMA, 0.3 CM. - LYMPHOVASCULAR INVOLVEMENT BY CARCINOMA. - HIGH GRADE DUCTAL CARCINOMA IN SITU WITH CALCIFICATIONS AND NECROSIS, 5.5 CM. - DCIS FOCALLY 0.1 CM FROM CAUTERIZED MEDIAL MARGIN. - SEE ONCOLOGY TABLE AND COMMENT. 4. Skin , Left additional mastectomy flap - BENIGN FIBROADIPOSE TISSUE. - NO EVIDENCE OF MALIGNANCY  Since consultation, the patient underwent chemotherapy with Dr. Burr Medico in Medical Oncology which she tolerated well but had neutropenia and required IV hydration. Mammoprint was high risk and she received adjuvant  dose dense AC followed by weekly Taxol. She underwent L mastectomy and SLN bx on 7-17 with results as above.  Her margins are close to DCIS but widely negative from invasive disease  The patient states that she plans to have breast reconstruction after radiation treatments. Patient notes that she finished chemotherapy Wednesday, 04/07/2017. Patient states that her feet, toes, and fingers feel "tight" which she states could be due to the chemotherapy. Patient reports that she quit smoking in May of 2018.            Of note in August bone scan and CT of chest abdomen and pelvis were conducted which were negative for metastatic disease   ALLERGIES:  is allergic to vicodin [hydrocodone-acetaminophen].  Meds: Current Outpatient Medications  Medication Sig Dispense Refill  . aspirin 81 MG tablet Take 81 mg by mouth daily.    Marland Kitchen atorvastatin (LIPITOR) 40 MG tablet Take 40 mg by mouth daily.    . calcium-vitamin D 250-100 MG-UNIT tablet Take 2 tablets by mouth daily.     . ergocalciferol (VITAMIN D2) 50000 UNITS capsule Take 50,000 Units by mouth every Friday.     Marland Kitchen HYDROcodone-homatropine (HYCODAN) 5-1.5 MG/5ML syrup Take 5 mLs by mouth every 6 (six) hours as needed for cough. 120 mL 0  . ibuprofen (ADVIL,MOTRIN) 600 MG tablet Take 1 tablet (600 mg total) by mouth every 8 (eight) hours as needed. 15 tablet 0  . levothyroxine (SYNTHROID, LEVOTHROID) 88 MCG tablet Take 88 mcg by mouth daily before breakfast.     . lidocaine-prilocaine (EMLA) cream Apply to affected area once 30 g 3  .  magic mouthwash w/lidocaine SOLN Take 5 mLs by mouth 3 (three) times daily as needed for mouth pain. 240 mL 1  . metFORMIN (GLUCOPHAGE) 500 MG tablet Take 1 tablet (500 mg total) 2 (two) times daily with a meal by mouth. 60 tablet 2  . olmesartan (BENICAR) 20 MG tablet Take 20 mg by mouth daily.    Marland Kitchen guaiFENesin-codeine 100-10 MG/5ML syrup Take 5 mLs by mouth every 6 (six) hours as needed for cough. (Patient not taking:  Reported on 03/03/2017) 120 mL 0  . HYDROcodone-acetaminophen (NORCO) 5-325 MG tablet Take 1-2 tablets by mouth every 6 (six) hours as needed for moderate pain or severe pain. (Patient not taking: Reported on 02/17/2017) 20 tablet 0  . methocarbamol (ROBAXIN) 500 MG tablet Take 1 tablet (500 mg total) by mouth every 8 (eight) hours as needed for muscle spasms. (Patient not taking: Reported on 03/03/2017) 30 tablet 0  . ondansetron (ZOFRAN) 8 MG tablet Take 1 tablet (8 mg total) by mouth 2 (two) times daily as needed. Start on the third day after chemotherapy. (Patient not taking: Reported on 04/09/2017) 30 tablet 1  . prochlorperazine (COMPAZINE) 10 MG tablet Take 1 tablet (10 mg total) by mouth every 6 (six) hours as needed (Nausea or vomiting). (Patient not taking: Reported on 02/17/2017) 30 tablet 1  . traMADol (ULTRAM) 50 MG tablet Take 50 mg by mouth every 6 (six) hours as needed for moderate pain or severe pain.      No current facility-administered medications for this encounter.     Physical Findings:  height is '5\' 3"'  (1.6 m) and weight is 172 lb 12.8 oz (78.4 kg). Her temperature is 98.2 F (36.8 C). Her blood pressure is 101/54 (abnormal) and her pulse is 97. Her oxygen saturation is 100%. .     General: Alert and oriented, in no acute distress HEENT: Head is normocephalic. Extraocular movements are intact. Oropharynx is clear. Neck: Neck is supple, no palpable cervical or supraclavicular lymphadenopathy. Heart: Regular in rate and rhythm with no murmurs, rubs, or gallops. Chest: Clear to auscultation bilaterally, with no rhonchi, wheezes, or rales. Abdomen: Soft, nontender, nondistended, with no rigidity or guarding. Extremities: No cyanosis or edema. Lymphatics: see Neck Exam Musculoskeletal: symmetric strength and muscle tone throughout. Excellent range of motion in the left shoulder. Neurologic: No obvious focalities. Speech is fluent.  Psychiatric: Judgment and insight are intact.  Affect is appropriate. Breast exam reveals expander in place in the left chest wall. Scars have helaed well over the left chest.  Vasc: Right upper chest portacath.   Lab Findings: Lab Results  Component Value Date   WBC 4.1 04/07/2017   HGB 11.1 (L) 04/07/2017   HCT 35.6 04/07/2017   MCV 97.3 04/07/2017   PLT 299 04/07/2017      Radiographic Findings: As above   Impression/Plan: Left breast cancer status post mastectomy We discussed adjuvant radiotherapy today.  I recommend 7 weeks of treatment to left breast.  The risks, benefits and side effects of this treatment were discussed in detail.  She understands that radiotherapy is associated with skin irritation and fatigue in the acute setting. Late effects can include cosmetic changes and rare injury to internal organs.   We discussed the risk of complications related to breast reconstruction in the setting of radiotherapy.  She is enthusiastic about proceeding with treatment. A consent form has been signed and placed in her chart.   A total of 5 medically necessary complex treatment devices will be  fabricated and supervised by me: 4 fields with MLCs for custom blocks to protect heart, and lungs;  and, a Vac-lok. MORE COMPLEX DEVICES MAY BE MADE IN DOSIMETRY FOR FIELD IN FIELD BEAMS FOR DOSE HOMOGENEITY.  I have requested : 3D Simulation which is medically necessary to give adequate dose to at risk tissues while sparing lungs and heart.  I have requested a DVH of the following structures: lungs, heart, left breast lumpectomy cavity.    The patient will receive 50.4 Gy in 28 fractions to the left chest wall and regional nodes with 4 fields.  This will be followed by a boost.  cc  Dr.Thimmappa - Surgeon, pt wants to have breast reconstruction after radiation.   I spent 60 minutes minutes face to face with the patient and more than 50% of that time was spent in counseling and/or coordination of  care. _____________________________________   Eppie Gibson, MD This document serves as a record of services personally performed by Eppie Gibson, MD. It was created on her behalf by Valeta Harms, a trained medical scribe. The creation of this record is based on the scribe's personal observations and the provider's statements to them. This document has been checked and approved by the attending provider.

## 2017-04-13 ENCOUNTER — Telehealth: Payer: Self-pay | Admitting: *Deleted

## 2017-04-13 ENCOUNTER — Encounter: Payer: Self-pay | Admitting: Radiation Oncology

## 2017-04-13 NOTE — Telephone Encounter (Signed)
Pt left vm that she had bubbles under her fingernails.  Returned call & she states that her 2-3 fingernails have clear fluid draining sometimes.  She denies any pus or redness but fingers feel tight.  She states that nails look a little brownish.  Informed to keep clean & soak 1-2 x/d with vinegar/warm water sol & keep dry & report any changes.  Pt expressed understanding.

## 2017-04-20 ENCOUNTER — Ambulatory Visit
Admission: RE | Admit: 2017-04-20 | Discharge: 2017-04-20 | Disposition: A | Discharge status: Home / Self Care | Payer: BC Managed Care – PPO | Source: Ambulatory Visit | Attending: Radiation Oncology | Admitting: Radiation Oncology

## 2017-04-20 ENCOUNTER — Inpatient Hospital Stay (HOSPITAL_BASED_OUTPATIENT_CLINIC_OR_DEPARTMENT_OTHER): Payer: BC Managed Care – PPO | Admitting: Medical

## 2017-04-20 ENCOUNTER — Telehealth: Payer: Self-pay

## 2017-04-20 ENCOUNTER — Inpatient Hospital Stay: Payer: BC Managed Care – PPO

## 2017-04-20 VITALS — BP 148/89 | HR 88 | Temp 97.9°F | Resp 24 | Ht 63.0 in | Wt 177.6 lb

## 2017-04-20 DIAGNOSIS — G620 Drug-induced polyneuropathy: Secondary | ICD-10-CM | POA: Diagnosis not present

## 2017-04-20 DIAGNOSIS — H6121 Impacted cerumen, right ear: Secondary | ICD-10-CM | POA: Diagnosis not present

## 2017-04-20 DIAGNOSIS — Z510 Encounter for antineoplastic radiation therapy: Secondary | ICD-10-CM | POA: Insufficient documentation

## 2017-04-20 DIAGNOSIS — Z79899 Other long term (current) drug therapy: Secondary | ICD-10-CM | POA: Insufficient documentation

## 2017-04-20 DIAGNOSIS — L03012 Cellulitis of left finger: Secondary | ICD-10-CM | POA: Diagnosis not present

## 2017-04-20 DIAGNOSIS — C50212 Malignant neoplasm of upper-inner quadrant of left female breast: Secondary | ICD-10-CM

## 2017-04-20 DIAGNOSIS — Z170 Estrogen receptor positive status [ER+]: Principal | ICD-10-CM

## 2017-04-20 DIAGNOSIS — Z7982 Long term (current) use of aspirin: Secondary | ICD-10-CM | POA: Insufficient documentation

## 2017-04-20 DIAGNOSIS — Z5111 Encounter for antineoplastic chemotherapy: Secondary | ICD-10-CM | POA: Diagnosis not present

## 2017-04-20 DIAGNOSIS — C779 Secondary and unspecified malignant neoplasm of lymph node, unspecified: Secondary | ICD-10-CM | POA: Insufficient documentation

## 2017-04-20 DIAGNOSIS — Z87891 Personal history of nicotine dependence: Secondary | ICD-10-CM | POA: Insufficient documentation

## 2017-04-20 DIAGNOSIS — L0291 Cutaneous abscess, unspecified: Secondary | ICD-10-CM

## 2017-04-20 DIAGNOSIS — Z9012 Acquired absence of left breast and nipple: Secondary | ICD-10-CM | POA: Insufficient documentation

## 2017-04-20 DIAGNOSIS — Z9221 Personal history of antineoplastic chemotherapy: Secondary | ICD-10-CM | POA: Insufficient documentation

## 2017-04-20 DIAGNOSIS — T451X5A Adverse effect of antineoplastic and immunosuppressive drugs, initial encounter: Secondary | ICD-10-CM

## 2017-04-20 MED ORDER — CEPHALEXIN 500 MG PO CAPS: 500.0000 mg | ORAL_CAPSULE | Freq: Four times a day (QID) | ORAL | 0 refills | Status: DC

## 2017-04-20 MED ORDER — MUPIROCIN 2 % EX OINT: TOPICAL_OINTMENT | CUTANEOUS | 1 refills | Status: DC

## 2017-04-20 MED ORDER — GABAPENTIN 300 MG PO CAPS: ORAL_CAPSULE | ORAL | 2 refills | Status: DC

## 2017-04-20 NOTE — Telephone Encounter (Signed)
I received a phone call from [facility] in [place]. Dr. Isidore Moos has requested that Ms. Bernath be seen in symptom management for a possible infection under her Left third digit on her hand. I spoke with Beth RN in symptom management and she agreed to have the patient seen. I called back to Candace in [place] and asked her to have Ms. Briley register upstairs so that she can be seen in symptom management today. She agreed.

## 2017-04-20 NOTE — Progress Notes (Signed)
Radiation Oncology         [phone] ________________________________  Name: Taylor Walsh MRN: [id number]  Date: 04/20/2017  DOB: 1955-11-19  SIMULATION AND TREATMENT PLANNING NOTE  / Special treatment procedure:   Outpatient  DIAGNOSIS:     ICD-10-CM   1. Malignant neoplasm of upper-inner quadrant of left breast in female, estrogen receptor positive ([place]) C50.212    Z17.0     NARRATIVE:  The patient was brought to the [place].  Identity was confirmed.  All relevant records and images related to the planned course of therapy were reviewed.  The patient freely provided informed written consent to proceed with treatment after reviewing the details related to the planned course of therapy. The consent form was witnessed and verified by the simulation staff.    Then, the patient was set-up in a stable reproducible supine position for radiation therapy with her ipsilateral arm over her head, and her upper body secured in a custom-made Vac-lok device.  CT images were obtained.  Surface markings were placed.  The CT images were loaded into the planning software.    Special treatment procedure:  Special treatment procedure was performed today due to the extra time and effort required by myself to plan and prepare this patient for deep inspiration breath hold technique.  I have determined cardiac sparing to be of benefit to this patient to prevent long term cardiac damage due to radiation of the heart.  Bellows were placed on the patient's abdomen. To facilitate cardiac sparing, the patient was coached by the radiation therapists on breath hold techniques and breathing practice was performed. Practice waveforms were obtained. The patient was then scanned while maintaining breath hold in the treatment position.  This image was then transferred over to the imaging specialist. The imaging specialist then created a fusion of the free breathing and breath hold scans using the chest  wall as the stable structure. I personally reviewed the fusion in axial, coronal and sagittal image planes.  Excellent cardiac sparing was obtained.  I felt the patient is an appropriate candidate for breath hold and the patient will be treated as such.  The image fusion was then reviewed with the patient to reinforce the necessity of reproducible breath hold.   TREATMENT PLANNING NOTE: Treatment planning then occurred.  The radiation prescription was entered and confirmed.     A total of 5 medically necessary complex treatment devices were fabricated and supervised by me: 4 fields with MLCs for custom blocks to protect heart, and lungs;  and, a Vac-lok. MORE COMPLEX DEVICES MAY BE MADE IN DOSIMETRY FOR FIELD IN FIELD BEAMS FOR DOSE HOMOGENEITY.  I have requested : 3D Simulation which is medically necessary to give adequate dose to at risk tissues while sparing lungs and heart.  I have requested a DVH of the following structures: lungs, heart, esophagus, spinal cord.    The patient will receive 50.4 Gy in 28 fractions to the left chest wall and regional nodes (SCV, Axilla) with 4 fields.   This will be followed by a boost.  Optical Surface Tracking Plan:  Since intensity modulated radiotherapy (IMRT) and 3D conformal radiation treatment methods are predicated on accurate and precise positioning for treatment, intrafraction motion monitoring is medically necessary to ensure accurate and safe treatment delivery. The ability to quantify intrafraction motion without excessive ionizing radiation dose can only be performed with optical surface tracking. Accordingly, surface imaging offers the opportunity to obtain 3D measurements of patient  position throughout IMRT and 3D treatments without excessive radiation exposure. I am ordering optical surface tracking for this patient's upcoming course of radiotherapy.  ________________________________   Reference:  Ursula Alert, J, et al. Surface  imaging-based analysis of intrafraction motion for breast radiotherapy patients.Journal of [place], n. 6, nov. 2014. ISSN [id number].  Available at: <http://www.jacmp.org/index.php/jacmp/article/view/4957>.    -----------------------------------  Eppie Gibson, MD

## 2017-04-20 NOTE — Patient Instructions (Signed)
Debrox over the counter.

## 2017-04-20 NOTE — Progress Notes (Signed)
[facility] Progress Note   Taylor Walsh [id number] January 02, 1956 62 y.o.  Taylor Walsh is managed by Dr. Truitt Merle  Actively treated with chemotherapy: yes  Current Therapy: Paclitaxel  Last Treated: 04/07/2017  Assessment: Plan:    Paronychia of finger of left hand - Plan: cephALEXin (KEFLEX) 500 MG capsule, mupirocin ointment (BACTROBAN) 2 %, Culture, Wound, Aerobic Culture (superficial specimen)  Neuropathy due to chemotherapeutic drug ([place]) - Plan: gabapentin (NEURONTIN) 300 MG capsule  Impacted cerumen of right ear   Paronychia of the second and third fingers bilaterally: A wound culture was obtained from the second and third fingers of the left hand.  The patient was given a prescription for Keflex 500 mg p.o. 4 times daily times 7 days and a prescription for Bactroban ointment.  She was instructed to continue to soak her fingers in a dilute vinegar solution and was told not to pull off her acrylic nails or underlying nails as this would disrupt the nail matrix and impaired regrowth of new nails.  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 Nightly.  Cerumen impaction of the right ear: The patient was instructed to begin over-the-counter Debrox per package instructions.  Please see After Visit Summary for patient specific instructions.  Future Appointments  Date Time Provider [place]  04/27/2017  4:50 PM Taylor Gibson, MD [facility] None  04/28/2017 12:30 PM [facility] [id number] [facility] None  04/29/2017 12:15 PM [facility] [id number] [facility] None  04/30/2017 12:00 PM [facility] [id number] [facility] None  05/03/2017  2:30 PM [facility] [id number] [facility] None  05/04/2017  1:45 PM [facility] [id number] [facility] None  05/05/2017  1:45 PM [facility] [id number] [facility] None  05/06/2017  1:45 PM [facility] [id number] [facility] None  05/07/2017  1:45 PM [facility] [id number]  [facility] None  05/10/2017  8:15 AM [facility] LAB 2 [facility] None  05/10/2017  8:30 AM [facility] FLUSH NURSE 2 [facility] None  05/10/2017  9:15 AM Truitt Merle, MD [facility] None  05/10/2017 10:30 AM [facility] [id number] [facility] None  05/11/2017  8:15 AM [facility] [id number] [facility] None  05/12/2017  8:15 AM [facility] [id number] [facility] None  05/13/2017  8:15 AM [facility] [id number] [facility] None  05/14/2017  8:15 AM [facility] [id number] [facility] None  05/17/2017  8:15 AM [facility] [id number] [facility] None  05/18/2017  8:45 AM [facility] [id number] [facility] None  05/19/2017  8:45 AM [facility] [id number] [facility] None  05/20/2017  8:45 AM [facility] [id number] [facility] None  05/21/2017  8:45 AM [facility] [id number] [facility] None  05/24/2017  8:45 AM [facility] [id number] [facility] None  05/25/2017  8:45 AM [facility] [id number] [facility] None  05/26/2017  8:45 AM [facility] [id number] [facility] None  05/27/2017  8:45 AM [facility] [id number] [facility] None  05/28/2017  8:45 AM [facility] [id number] [facility] None  05/31/2017  8:45 AM [facility] [id number] [facility] None  06/01/2017  8:45 AM [facility] [id number] [facility] None  06/02/2017  8:45 AM [facility] [id number] [facility] None  06/03/2017  8:45 AM [facility] [id number] [facility] None  06/04/2017  8:45 AM [facility] [id number] [facility] None  06/07/2017  8:45 AM [facility] [id number] [facility] None  06/08/2017  8:45 AM [facility] [id number] [facility] None  06/09/2017  8:45 AM [facility] [id number] [facility] None  06/10/2017  8:45 AM [facility] [id number] [facility] None  06/11/2017  8:45 AM [facility] [id number] [facility] None    Orders Placed This Encounter  Procedures  . Culture, Wound  . Aerobic Culture (superficial specimen)       Subjective:   Patient ID:  Taylor Silversmith  L Walsh is a 62 y.o. (DOB 1955-08-22) female.  Chief Complaint:  Chief Complaint  Patient presents with    . 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 She has most recently been treated with cycle 12 of paclitaxel which was dosed on 04/07/2017.  She is followed by Dr. Truitt Merle.  She presents to the clinic today with a report that she has an infection under her nails.  She has acrylic nails but has noted that her acrylic nails and underlying natural nails are pulling away from the nail matrix.  She has foul smelling drainage from her distal nails at the nail bed of her second and third fingers of her right and left hand.  She is also having swelling of the fingertips of these fingers.  She has been experiencing progressive neuropathy in her hands and feet since beginning Taxol.  She is not taking any medications for this.  She also reports having fullness in her right ear with popping.  Medications: I have reviewed the patient's current medications.  Allergies:  Allergies  Allergen Reactions  . Vicodin [Hydrocodone-Acetaminophen] Swelling and Other (See Comments)    SWELLING REACTION UNSPECIFIED     Past Medical History:  Diagnosis Date  . Anxiety   . Arthritis   . Cancer ([place]) 10/08/2016   left breast cancer  . Diabetes mellitus without complication ([place])   . Dyslipidemia   . Family history of breast cancer   . Family history of ovarian cancer   . Family history of prostate cancer   . GERD (gastroesophageal reflux disease)    nexium if needed  . Heart murmur   . Hypertension   . Hypothyroid   . Osteopenia   . Pneumonia 10/08/2016   June 2013    Past Surgical History:  Procedure Laterality Date  . ABDOMINAL HYSTERECTOMY    . BREAST RECONSTRUCTION WITH PLACEMENT OF TISSUE EXPANDER AND FLEX HD (ACELLULAR HYDRATED DERMIS) Left 10/13/2016   Procedure: LEFT BREAST RECONSTRUCTION WITH PLACEMENT  OF TISSUE EXPANDER AND ALLODERM;  Surgeon: Irene Limbo, MD;  Location: [place];  Service: Plastics;  Laterality: Left;  . FOOT SURGERY     bi-lat/ repaired hammer toes  . MASTECTOMY W/ SENTINEL NODE BIOPSY Left 10/13/2016  . MASTECTOMY W/ SENTINEL NODE BIOPSY Left 10/13/2016   Procedure: LEFT TOTAL MASTECTOMY WITH LEFT AXILLARY SENTINEL LYMPH NODE BIOPSY;  Surgeon: Coralie Keens, MD;  Location: [place];  Service: General;  Laterality: Left;  . PORTACATH PLACEMENT Right 11/24/2016   Procedure: INSERTION PORT-A-CATH;  Surgeon: Fanny Skates, MD;  Location: [facility];  Service: General;  Laterality: Right;  . TUBAL LIGATION    . VIDEO ASSISTED THORACOSCOPY (VATS)/WEDGE RESECTION Left 02/25/2015   Procedure: VIDEO ASSISTED THORACOSCOPY (VATS)/ LUL WEDGE RESECTION with ON Q placement.;  Surgeon: Melrose Nakayama, MD;  Location: [place];  Service: Thoracic;  Laterality: Left;    Family History  Problem Relation Age of Onset  . Cancer Mother        brain, ovarian  . Hypertension Mother   . Stroke Father   . Hypertension Father   . Cancer Sister 54       bilateral breast cancer, 2nd cancer at 67  . Cancer Brother        dx with prostate  cancer in the 54s  . Cancer Brother        dx in his 17s with prostate cancer  . Brain cancer Maternal Uncle   . Cirrhosis Brother   . Cancer Brother        NOS  . Cancer Paternal Aunt        NOS  . Cancer Cousin        paternal cousin with cancer NOS    Social History   Socioeconomic History  . Marital status: Single    Spouse name: Not on file  . Number of children: 2  . Years of education: Not on file  . Highest education level: Not on file  Social Needs  . Financial resource strain: Not on file  . Food insecurity - worry: Not on file  . Food insecurity - inability: Not on file  . Transportation needs - medical: Not on file  . Transportation needs - non-medical: Not on file  Occupational History  . Not on file    Tobacco Use  . Smoking status: Former Smoker    Packs/day: 1.00    Years: 42.00    Pack years: 42.00    Last attempt to quit: 08/27/2016    Years since quitting: 0.6  . Smokeless tobacco: Never Used  . Tobacco comment: 5/1/18she smokes occasionally when stressed. She is smoking 4 cigarettes a week  Substance and Sexual Activity  . Alcohol use: No    Alcohol/week: 0.0 oz    Comment: once or twice yearly.  . Drug use: No  . Sexual activity: Not on file  Other Topics Concern  . Not on file  Social History Narrative  . Not on file    Past Medical History, Surgical history, Social history, and Family history were reviewed and updated as appropriate.   Please see review of systems for further details on the patient's review from today.   Review of Systems:  Review of Systems  Constitutional: Negative for chills, diaphoresis and fever.  HENT:       Fullness in the right ear with popping.  Skin:       Foul-smelling drainage from the nailbeds of the second and third fingers bilaterally.  Swelling of the fingertips of the second and third fingers bilaterally.  Neurological: Positive for numbness (Fingers and toes).    Objective:   Physical Exam:  BP (!) 148/89 (BP Location: Right Arm, Patient Position: Sitting)   Pulse 88   Temp 97.9 F (36.6 C) (Oral)   Resp (!) 24   Ht 5' 3" (1.6 m)   Wt 177 lb 9.6 oz (80.6 kg)   SpO2 99%   BMI 31.46 kg/m  ECOG: 0  Physical Exam  Constitutional: No distress.  The patient is an adult female who appears to be in no acute distress.  HENT:  Head: Normocephalic.  Left Ear: External ear normal. No drainage. Tympanic membrane is not injected, not scarred, not perforated, not erythematous and not bulging.  No middle ear effusion.  The right auditory canal is blocked by dark brown cerumen.  I am unable to visualize the tympanic membrane.  Cardiovascular: Normal rate, regular rhythm and normal heart sounds. Exam reveals no gallop and no  friction rub.  No murmur heard. Pulmonary/Chest: Effort normal and breath sounds normal. No respiratory distress. She has no wheezes. She has no rales.  Neurological: She is alert. Coordination normal.  Skin: Skin is warm and dry. She is not diaphoretic.  The patient has  acrylic nails in place.  There is a foul clear drainage from the nail bed of the second and third fingers bilaterally.  These nails are beginning to avulse from the nail bed.  The tips of the second and third fingers bilaterally are slightly swollen, without increased warmth, and are nontender.  Psychiatric: She has a normal mood and affect. Her behavior is normal. Judgment and thought content normal.          Lab Review:     Component Value Date/Time   NA 137 04/07/2017 0904   NA 139 03/31/2017 0947   K 4.2 04/07/2017 0904   K 3.8 03/31/2017 0947   CL 103 04/07/2017 0904   CO2 24 04/07/2017 0904   CO2 25 03/31/2017 0947   GLUCOSE 157 (H) 04/07/2017 0904   GLUCOSE 127 03/31/2017 0947   BUN 7 04/07/2017 0904   BUN 11.1 03/31/2017 0947   CREATININE 0.76 04/07/2017 0904   CREATININE 0.7 03/31/2017 0947   CALCIUM 9.7 04/07/2017 0904   CALCIUM 9.4 03/31/2017 0947   PROT 6.6 04/07/2017 0904   PROT 6.9 03/31/2017 0947   ALBUMIN 3.6 04/07/2017 0904   ALBUMIN 3.9 03/31/2017 0947   AST 18 04/07/2017 0904   AST 20 03/31/2017 0947   ALT 15 04/07/2017 0904   ALT 20 03/31/2017 0947   ALKPHOS 67 04/07/2017 0904   ALKPHOS 68 03/31/2017 0947   BILITOT 0.8 04/07/2017 0904   BILITOT 1.04 03/31/2017 0947   GFRNONAA >60 04/07/2017 0904   GFRAA >60 04/07/2017 0904       Component Value Date/Time   WBC 4.1 04/07/2017 0904   RBC 3.66 (L) 04/07/2017 0904   HGB 11.1 (L) 04/07/2017 0904   HGB 11.0 (L) 03/31/2017 0947   HCT 35.6 04/07/2017 0904   HCT 33.0 (L) 03/31/2017 0947   PLT 299 04/07/2017 0904   PLT 283 03/31/2017 0947   MCV 97.3 04/07/2017 0904   MCV 94.7 03/31/2017 0947   MCH 30.3 04/07/2017 0904   MCHC  31.2 (L) 04/07/2017 0904   RDW 17.1 (H) 04/07/2017 0904   RDW 19.9 (H) 03/31/2017 0947   LYMPHSABS 0.9 04/07/2017 0904   LYMPHSABS 0.9 03/31/2017 0947   MONOABS 0.5 04/07/2017 0904   MONOABS 0.9 03/31/2017 0947   EOSABS 0.0 04/07/2017 0904   EOSABS 0.0 03/31/2017 0947   BASOSABS 0.0 04/07/2017 0904   BASOSABS 0.0 03/31/2017 0947   -------------------------------  Imaging from last 24 hours (if applicable):  Radiology interpretation: No results found.

## 2017-04-21 DIAGNOSIS — C50212 Malignant neoplasm of upper-inner quadrant of left female breast: Secondary | ICD-10-CM | POA: Diagnosis not present

## 2017-04-21 NOTE — Progress Notes (Signed)
These preliminary result these preliminary results were noted.  Awaiting final report.

## 2017-04-22 ENCOUNTER — Telehealth: Payer: Self-pay | Admitting: Medical

## 2017-04-22 LAB — AEROBIC CULTURE W GRAM STAIN (SUPERFICIAL SPECIMEN)

## 2017-04-22 NOTE — Telephone Encounter (Signed)
Per 1/22 no los °

## 2017-04-27 ENCOUNTER — Ambulatory Visit
Admission: RE | Admit: 2017-04-27 | Discharge: 2017-04-27 | Disposition: A | Discharge status: Home / Self Care | Payer: BC Managed Care – PPO | Source: Ambulatory Visit | Attending: Radiation Oncology | Admitting: Radiation Oncology

## 2017-04-27 DIAGNOSIS — C50212 Malignant neoplasm of upper-inner quadrant of left female breast: Secondary | ICD-10-CM | POA: Diagnosis not present

## 2017-04-27 NOTE — Progress Notes (Signed)
These preliminary result these preliminary results were noted.  Awaiting final report.

## 2017-04-28 ENCOUNTER — Ambulatory Visit
Admission: RE | Admit: 2017-04-28 | Discharge: 2017-04-28 | Disposition: A | Discharge status: Home / Self Care | Payer: BC Managed Care – PPO | Source: Ambulatory Visit | Attending: Radiation Oncology | Admitting: Radiation Oncology

## 2017-04-28 DIAGNOSIS — C50212 Malignant neoplasm of upper-inner quadrant of left female breast: Secondary | ICD-10-CM | POA: Diagnosis not present

## 2017-04-29 ENCOUNTER — Ambulatory Visit
Admission: RE | Admit: 2017-04-29 | Discharge: 2017-04-29 | Disposition: A | Discharge status: Home / Self Care | Payer: BC Managed Care – PPO | Source: Ambulatory Visit | Attending: Radiation Oncology | Admitting: Radiation Oncology

## 2017-04-29 ENCOUNTER — Ambulatory Visit: Admission: RE | Admit: 2017-04-29 | Payer: BC Managed Care – PPO | Source: Ambulatory Visit

## 2017-04-29 DIAGNOSIS — C50212 Malignant neoplasm of upper-inner quadrant of left female breast: Secondary | ICD-10-CM | POA: Diagnosis not present

## 2017-04-30 ENCOUNTER — Ambulatory Visit
Admission: RE | Admit: 2017-04-30 | Discharge: 2017-04-30 | Disposition: A | Discharge status: Home / Self Care | Payer: BC Managed Care – PPO | Source: Ambulatory Visit | Attending: Radiation Oncology | Admitting: Radiation Oncology

## 2017-04-30 DIAGNOSIS — C50212 Malignant neoplasm of upper-inner quadrant of left female breast: Secondary | ICD-10-CM | POA: Diagnosis not present

## 2017-05-03 ENCOUNTER — Ambulatory Visit
Admission: RE | Admit: 2017-05-03 | Discharge: 2017-05-03 | Disposition: A | Discharge status: Home / Self Care | Payer: BC Managed Care – PPO | Source: Ambulatory Visit | Attending: Radiation Oncology | Admitting: Radiation Oncology

## 2017-05-03 DIAGNOSIS — C50212 Malignant neoplasm of upper-inner quadrant of left female breast: Secondary | ICD-10-CM | POA: Diagnosis not present

## 2017-05-04 ENCOUNTER — Ambulatory Visit
Admission: RE | Admit: 2017-05-04 | Discharge: 2017-05-04 | Disposition: A | Discharge status: Home / Self Care | Payer: BC Managed Care – PPO | Source: Ambulatory Visit | Attending: Radiation Oncology | Admitting: Radiation Oncology

## 2017-05-04 DIAGNOSIS — C50212 Malignant neoplasm of upper-inner quadrant of left female breast: Secondary | ICD-10-CM | POA: Diagnosis not present

## 2017-05-05 ENCOUNTER — Ambulatory Visit
Admission: RE | Admit: 2017-05-05 | Discharge: 2017-05-05 | Disposition: A | Discharge status: Home / Self Care | Payer: BC Managed Care – PPO | Source: Ambulatory Visit | Attending: Radiation Oncology | Admitting: Radiation Oncology

## 2017-05-05 DIAGNOSIS — C50212 Malignant neoplasm of upper-inner quadrant of left female breast: Secondary | ICD-10-CM | POA: Diagnosis not present

## 2017-05-06 ENCOUNTER — Ambulatory Visit
Admission: RE | Admit: 2017-05-06 | Discharge: 2017-05-06 | Disposition: A | Discharge status: Home / Self Care | Payer: BC Managed Care – PPO | Source: Ambulatory Visit | Attending: Radiation Oncology | Admitting: Radiation Oncology

## 2017-05-06 DIAGNOSIS — C50212 Malignant neoplasm of upper-inner quadrant of left female breast: Secondary | ICD-10-CM | POA: Diagnosis not present

## 2017-05-06 NOTE — Progress Notes (Signed)
[place]  [phone] [phone]  Clinic Follow-up Note   Patient Care Team: Marton Redwood, MD as PCP - General (Internal Medicine) Melrose Nakayama, MD as Consulting Physician (Cardiothoracic Surgery) Fanny Skates, MD as Consulting Physician (General Surgery) Truitt Merle, MD as Consulting Physician (Hematology)   Date of Service:  05/10/2017  CHIEF COMPLAINTS F/U left breast cancer   Oncology History   Cancer Staging Breast cancer of upper-inner quadrant of left female breast [facility]) Staging form: Breast, AJCC 8th Edition - Clinical stage from 07/10/2016: Stage 0 (cTis (DCIS), cN0, cM0, G3, ER: Positive, PR: Positive, HER2: Not Assessed) - Signed by Truitt Merle, MD on 07/28/2016 - Pathologic stage from 10/26/2016: Stage IA (pT1a, pN1a, cM0, G2, ER: Positive, PR: Positive, HER2: Negative) - Signed by Truitt Merle, MD on 11/11/2016       Breast cancer of upper-inner quadrant of left female breast ([place])   07/10/2016 Initial Biopsy    Diagnosis 1. 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 2. 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      07/10/2016 Receptors her2    ER 95%, PR 80-90%+, both strong staining, Ki67 80-90%      07/22/2016 Initial Diagnosis    Ductal carcinoma in situ (DCIS) of left breast      07/27/2016 Imaging    Breast MRI w wo contrast showed  1. Extensive mass and non mass enhancement along the lower inner aspect the left breast, lower outer quadrant, spanning 10.2 cm from anterior to posterior, including both biopsied lesions, as detailed above. This is all consistent with DCIS. 2. No other abnormal enhancement in the left breast. 3. No abnormal or enlarged axillary lymph nodes. 4. No evidence of malignancy  in the right breast      08/10/2016 Genetic Testing    ATM c.4066A>G VUS identified on the Common Hereditary Cancer panel. The Hereditary Gene Panel offered by [facility] includes sequencing and/or deletion duplication testing of the following 46 genes: APC, ATM, AXIN2, BARD1, BMPR1A, BRCA1, BRCA2, BRIP1, CDH1, CDKN2A (p14ARF), CDKN2A (p16INK4a), CHEK2, CTNNA1, DICER1, EPCAM (Deletion/duplication testing only), GREM1 (promoter region deletion/duplication testing only), KIT, MEN1, MLH1, MSH2, MSH3, MSH6, MUTYH, NBN, NF1, NHTL1, PALB2, PDGFRA, PMS2, POLD1, POLE, PTEN, RAD50, RAD51C, RAD51D, SDHB, SDHC, SDHD, SMAD4, SMARCA4. STK11, TP53, TSC1, TSC2, and VHL.  The following gene was evaluated for sequence changes only: SDHA and HOXB13 c.251G>A variant only.  The report date is Aug 09, 2016.       10/13/2016 Surgery    LEFT TOTAL MASTECTOMY WITH LEFT AXILLARY SENTINEL LYMPH NODE BIOPSY By Dr. Ninfa Linden      10/13/2016 Pathology Results    Surgery Diagnosis 10/13/16  1. Lymph node, sentinel, biopsy, Left Axillary - METASTATIC CARCINOMA IN ONE LYMPH NODE (1/1). 2. Lymph node, sentinel, biopsy, Left - METASTATIC CARCINOMA IN ONE LYMPH NODE (1/1). 3. Breast, radical mastectomy (including lymph nodes), Left - INVASIVE DUCTAL CARCINOMA, 0.3 CM. - LYMPHOVASCULAR INVOLVEMENT BY CARCINOMA. - HIGH GRADE DUCTAL CARCINOMA IN SITU WITH CALCIFICATIONS AND NECROSIS, 5.5 CM. - DCIS FOCALLY 0.1 CM FROM CAUTERIZED MEDIAL MARGIN. - SEE ONCOLOGY TABLE AND COMMENT. 4. Skin , Left additional mastectomy flap - BENIGN FIBROADIPOSE TISSUE. - NO EVIDENCE OF MALIGNANCY.      10/30/2016 Miscellaneous    MammaPrint 10/31/26 Result:  High Risk,Luminal type  B MPI: -0.368 Average 10-year risk of recurrence untreated: 29%      11/18/2016 Imaging    CT CAP W Contrast 11/18/16 IMPRESSION: 1. Skin thickening and interstitial changes involving the left breast, likely related to radiation. Surgical changes are also noted with a  tissue expander in place. Probable postop fluid collection in the upper outer quadrant breast. No enlarged supraclavicular or axillary lymph nodes. 2. No CT findings suggesting metastatic disease involving the chest, abdomen or pelvis or bony structures. 3. Surgical changes from a left upper lobe wedge resection. No findings for recurrent tumor.      11/23/2016 Imaging    Bone Scan Whole body 11/23/16 IMPRESSION: No definite scintigraphic evidence of osseous metastatic disease as above.      11/25/2016 - 04/07/2017 Chemotherapy    Adjuvant chemotherapy AC every 2 weeks for 4 cycles, 11/25/16-01/05/17, followed by Taxol weekly for 12 cycles starting 01/20/17       04/28/2017 -  Radiation Therapy     Radiation therapy with Dr. Isidore Moos        HISTORY OF PRESENTING ILLNESS (07/28/16):  Taylor Walsh 62 y.o. 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 She presents to the clinic today by herself. She was referred by her surgeon Dr. Dalbert Batman.   This was discovered by routine screening mammogram. She denies any palpable breast mass or adenopathy. She denies any other new symptoms. She was contacted for her need of a biopsy to further investigate the calcification found in her mammogram. A biopsy was done leading to a diagnosis of her DCIS. She reports having arthritis in the right knee. She reported having a tubal ligation and hysterectomy. She also has family history of breast cancer with mother and sister. She reports to live with her daughter and 2 of her grandchildren. She reports of having history of HTN, arthritis, Thyroidism, being Pre-diabetic, Osteopenia and is currently back to Smoking. She also reports her brother has a history of blood clots and he was told that it was hereditary which she is following up with Dr. Brigitte Pulse about testing.   GYN HISTORY  Menarchal: 11 LMP: 36 before hysterectomy Contraceptive: BN/A HRT: no GP:G2P2    PREVIOUS THERAPY: Adjuvant  chemotherapy AC every 2 weeks for 4 cycles 11/25/16-01/05/17   CURRENT THERAPY: Radiation therapy started on 04/28/17   INTERVAL HISTORY:   Taylor Walsh is here for a follow up. She presents to clinic by herself.  She has started radiation about 2 weeks ago, tolerating well so far.  She still has numbness and tingling of her fingers, and feels her fingers are swollen and tight, her hand function is not significantly impacted.  She has thickening nail, had paronychia a few weeks ago, resolved now.  Otherwise doing well, her energy level has improved since she completed chemotherapy.   MEDICAL HISTORY:  Past Medical History:  Diagnosis Date  . Anxiety   . Arthritis   . Cancer ([place]) 10/08/2016   left breast cancer  . Diabetes mellitus without complication ([place])   . Dyslipidemia   . Family history of breast cancer   . Family history of ovarian cancer   . Family history of prostate cancer   . GERD (gastroesophageal reflux disease)    nexium if needed  . Heart murmur   . Hypertension   . Hypothyroid   . Osteopenia   . Pneumonia 10/08/2016   June 2013    SURGICAL HISTORY: Past Surgical History:  Procedure  Laterality Date  . ABDOMINAL HYSTERECTOMY    . BREAST RECONSTRUCTION WITH PLACEMENT OF TISSUE EXPANDER AND FLEX HD (ACELLULAR HYDRATED DERMIS) Left 10/13/2016   Procedure: LEFT BREAST RECONSTRUCTION WITH PLACEMENT OF TISSUE EXPANDER AND ALLODERM;  Surgeon: Irene Limbo, MD;  Location: [place];  Service: Plastics;  Laterality: Left;  . FOOT SURGERY     bi-lat/ repaired hammer toes  . MASTECTOMY W/ SENTINEL NODE BIOPSY Left 10/13/2016  . MASTECTOMY W/ SENTINEL NODE BIOPSY Left 10/13/2016   Procedure: LEFT TOTAL MASTECTOMY WITH LEFT AXILLARY SENTINEL LYMPH NODE BIOPSY;  Surgeon: Coralie Keens, MD;  Location: [place];  Service: General;  Laterality: Left;  . PORTACATH PLACEMENT Right 11/24/2016   Procedure: INSERTION PORT-A-CATH;  Surgeon: Fanny Skates, MD;  Location: [place];  Service: General;  Laterality: Right;  . TUBAL LIGATION    . VIDEO ASSISTED THORACOSCOPY (VATS)/WEDGE RESECTION Left 02/25/2015   Procedure: VIDEO ASSISTED THORACOSCOPY (VATS)/ LUL WEDGE RESECTION with ON Q placement.;  Surgeon: Melrose Nakayama, MD;  Location: [place];  Service: Thoracic;  Laterality: Left;    SOCIAL HISTORY: Social History   Socioeconomic History  . Marital status: Single    Spouse name: Not on file  . Number of children: 2  . Years of education: Not on file  . Highest education level: Not on file  Social Needs  . Financial resource strain: Not on file  . Food insecurity - worry: Not on file  . Food insecurity - inability: Not on file  . Transportation needs - medical: Not on file  . Transportation needs - non-medical: Not on file  Occupational History  . Not on file  Tobacco Use  . Smoking status: Former Smoker    Packs/day: 1.00    Years: 42.00    Pack years: 42.00    Last attempt to quit: 08/27/2016    Years since quitting: 0.7  . Smokeless tobacco: Never Used  . Tobacco comment: 5/1/18she smokes occasionally when stressed. She is smoking 4 cigarettes a week  Substance and Sexual Activity  . Alcohol use: No    Alcohol/week: 0.0 oz    Comment: once or twice yearly.  . Drug use: No  . Sexual activity: Not on file  Other Topics Concern  . Not on file  Social History Narrative  . Not on file    FAMILY HISTORY: Family History  Problem Relation Age of Onset  . Cancer Mother        brain, ovarian  . Hypertension Mother   . Stroke Father   . Hypertension Father   . Cancer Sister 17       bilateral breast cancer, 2nd cancer at 2  . Cancer Brother        dx with prostate cancer in the 72s  . Cancer Brother        dx in his 69s with prostate cancer  . Brain cancer Maternal Uncle   . Cirrhosis Brother   . Cancer Brother        NOS  . Cancer Paternal Aunt        NOS  . Cancer Cousin        paternal cousin with cancer NOS     ALLERGIES:  is allergic to vicodin [hydrocodone-acetaminophen].  MEDICATIONS:  Current Outpatient Medications  Medication Sig Dispense Refill  . aspirin 81 MG tablet Take 81 mg by mouth daily.    Marland Kitchen atorvastatin (LIPITOR) 40 MG tablet Take 40 mg by mouth daily.    Marland Kitchen  calcium-vitamin D 250-100 MG-UNIT tablet Take 2 tablets by mouth daily.     . cephALEXin (KEFLEX) 500 MG capsule Take 1 capsule (500 mg total) by mouth 4 (four) times daily. 28 capsule 0  . ergocalciferol (VITAMIN D2) 50000 UNITS capsule Take 50,000 Units by mouth every Friday.     . gabapentin (NEURONTIN) 300 MG capsule 300 mg PO QHS 30 capsule 2  . HYDROcodone-acetaminophen (NORCO) 5-325 MG tablet Take 1-2 tablets by mouth every 6 (six) hours as needed for moderate pain or severe pain. 20 tablet 0  . HYDROcodone-homatropine (HYCODAN) 5-1.5 MG/5ML syrup Take 5 mLs by mouth every 6 (six) hours as needed for cough. 120 mL 0  . ibuprofen (ADVIL,MOTRIN) 600 MG tablet Take 1 tablet (600 mg total) by mouth every 8 (eight) hours as needed. 15 tablet 0  . levothyroxine (SYNTHROID, LEVOTHROID) 88 MCG tablet Take 88 mcg by mouth daily before breakfast.     . lidocaine-prilocaine (EMLA) cream Apply to affected area once 30 g 3  . magic mouthwash w/lidocaine SOLN Take 5 mLs by mouth 3 (three) times daily as needed for mouth pain. (Patient not taking: Reported on 04/20/2017) 240 mL 1  . metFORMIN (GLUCOPHAGE) 500 MG tablet Take 1 tablet (500 mg total) 2 (two) times daily with a meal by mouth. 60 tablet 2  . methocarbamol (ROBAXIN) 500 MG tablet Take 1 tablet (500 mg total) by mouth every 8 (eight) hours as needed for muscle spasms. (Patient not taking: Reported on 03/03/2017) 30 tablet 0  . mupirocin ointment (BACTROBAN) 2 % Apply under nails with cotton swab TID 30 g 1  . olmesartan (BENICAR) 20 MG tablet Take 20 mg by mouth daily.    . ondansetron (ZOFRAN) 8 MG tablet Take 1 tablet (8 mg total) by mouth 2 (two) times daily as needed. Start  on the third day after chemotherapy. (Patient not taking: Reported on 04/09/2017) 30 tablet 1  . prochlorperazine (COMPAZINE) 10 MG tablet Take 1 tablet (10 mg total) by mouth every 6 (six) hours as needed (Nausea or vomiting). (Patient not taking: Reported on 02/17/2017) 30 tablet 1  . traMADol (ULTRAM) 50 MG tablet Take 50 mg by mouth every 6 (six) hours as needed for moderate pain or severe pain.      No current facility-administered medications for this visit.     REVIEW OF SYSTEMS:  Constitutional: Denies fevers, chills  Eyes: Denies blurriness of vision, double vision or watery eyes. Denies eye pain, redness, or itching  Ears, nose, mouth, throat, and face: Denies mucositis or nasal congestion  Respiratory: (+) occasional cough  Cardiovascular: Denies palpitation, chest discomfort or lower extremity swelling Gastrointestinal:  Denies vomiting, constipation, diarrhea, or change in bowel habits  GU/GYN: recurrent herpes outbreak, improving, some residual sensitivity Skin: Denies abnormal skin rashes Lymphatics: Denies new lymphadenopathy or easy bruising Musculoskeletal: (+) arthritis in the right knee.  Neurological:Denies new weaknesses (+) mild numbness in her fingertips, increase in neuropathy in her feet (+) change in balance  Breast: negative  Behavioral/Psych: Mood is stable, no new changes  All other systems were reviewed with the patient and are negative.  PHYSICAL EXAMINATION: ECOG PERFORMANCE STATUS: 1  Vitals:   05/10/17 0934  BP: 123/62  Pulse: 80  Resp: 18  Temp: 97.9 F (36.6 C)  SpO2: 100%   Filed Weights   05/10/17 0934  Weight: 170 lb 12.8 oz (77.5 kg)     GENERAL:alert, no distress and comfortable SKIN: skin color, texture, turgor are  normal, no rashes  EYES: normal, conjunctiva are pink and non-injected, sclera clear OROPHARYNX:no exudate, no erythema and lips, buccal mucosa, and tongue normal . No obvious mucositis NECK: supple, thyroid normal size,  non-tender, without nodularity LYMPH:  no palpable  Cervical, supraclavicular, or axillary lymphadenopathy LUNGS: clear to auscultation bilaterally with normal breathing effort HEART: regular rate & rhythm and no murmurs and no lower extremity edema ABDOMEN:abdomen soft, non-tender and normal bowel sounds. No palpable hepatomegaly or masses GU/GYN: external exam reveals healing herpetic lesions to left labia minora Musculoskeletal:no cyanosis of digits and no clubbing  PSYCH: alert & oriented x 3 with fluent speech NEURO: no focal motor (+) mild sensory deficit in fingers, L>R Breasts: Breast inspection showed them to be symmetrical with no nipple discharge. (+) S/p left mastectomy with tissue expander placement with incision well-healed, no tenderness.   LABORATORY DATA:  I have reviewed the data as listed CBC Latest Ref Rng & Units 05/10/2017 04/07/2017 03/31/2017  WBC 3.9 - 10.3 K/uL 4.0 4.1 4.1  Hemoglobin 11.6 - 15.9 g/dL 12.5 11.1(L) 11.0(L)  Hematocrit 34.8 - 46.6 % 39.4 35.6 33.0(L)  Platelets 145 - 400 K/uL 198 299 283    CMP Latest Ref Rng & Units 05/10/2017 04/07/2017 03/31/2017  Glucose 70 - 140 mg/dL 120 157(H) 127  BUN 7 - 26 mg/dL 8 7 11.1  Creatinine 0.60 - 1.10 mg/dL 0.73 0.76 0.7  Sodium 136 - 145 mmol/L 139 137 139  Potassium 3.5 - 5.1 mmol/L 4.3 4.2 3.8  Chloride 98 - 109 mmol/L 103 103 -  CO2 22 - 29 mmol/L _0 Calcium 8.4 - 10.4 mg/dL 9.9 9.7 9.4  Total Protein 6.4 - 8.3 g/dL 7.1 6.6 6.9  Total Bilirubin 0.2 - 1.2 mg/dL 0.6 0.8 1.04  Alkaline Phos 40 - 150 U/L 64 67 68  AST 5 - 34 U/L _1 ALT 0 - 55 U/L _2 PATHOLOGY:   Surgery Diagnosis 10/13/16  1. Lymph node, sentinel, biopsy, Left Axillary - METASTATIC CARCINOMA IN ONE LYMPH NODE (1/1). 2. Lymph node, sentinel, biopsy, Left - METASTATIC CARCINOMA IN ONE LYMPH NODE (1/1). 3. Breast, radical mastectomy (including lymph nodes), Left - INVASIVE DUCTAL CARCINOMA, 0.3 CM. - LYMPHOVASCULAR  INVOLVEMENT BY CARCINOMA. - HIGH GRADE DUCTAL CARCINOMA IN SITU WITH CALCIFICATIONS AND NECROSIS, 5.5 CM. - DCIS FOCALLY 0.1 CM FROM CAUTERIZED MEDIAL MARGIN. - SEE ONCOLOGY TABLE AND COMMENT. 4. Skin , Left additional mastectomy flap - BENIGN FIBROADIPOSE TISSUE. - NO EVIDENCE OF MALIGNANCY. Microscopic Comment 3. BREAST, INVASIVE TUMOR Procedure: Mastectomy and two sentinel lymph nodes with additional left mastectomy flap. Laterality: Left breast Tumor Size: 0.3 cm invasive carcinoma; 5.5 cm DCIS Histologic Type: Ductal Grade: II Tubular Differentiation: 2 Nuclear Pleomorphism: 2 Mitotic Count: 2 Ductal Carcinoma in Situ (DCIS): Present, high grade with necrosis Extent of Tumor: Skin: Free of tumor Nipple: Free of tumor Microscopic Comment(continued) Skeletal muscle: Free of tumor Margins: Free of tumor Invasive carcinoma, distance from closest margin: Greater than 1.5 cm DCIS, distance from closest margin: Less than 0.1 cm from medial margin Regional Lymph Nodes: Number of Lymph Nodes Examined: 2 Number of Sentinel Lymph Nodes Examined: 2 Lymph Nodes with Macrometastases: 2 Lymph Nodes with Micrometastases: 0 Lymph Nodes with Isolated Tumor Cells: 0 Breast Prognostic Profile: Pending Estrogen Receptor: Pending Progesterone Receptor: Pending Her2: Pending Ki-67: Pending Best tumor block for sendout testing: [facility] Pathologic Stage Classification (pTNM, AJCC 8th Edition): Primary Tumor (pT): pT1a  Regional Lymph Nodes (pN): pN1a Distant Metastases (pM): pMX Comments: There is extensive high grade ductal carcinoma in situ with necrosis. Multiple additional portions of the tissue are examined and there is a 0.3 cm invasive ductal carcinoma associated with lymphovascular involvement by tumor. Breast prognostic profile will be performed on the invasive carcinoma and reported as an addendum. Immunohistochemistry shows positive basal cell staining in the DCIS with calponin, smooth  muscle myosin and p63. ADDITIONAL INFORMATION: 3. By immunohistochemistry, the tumor cells are Negative for Her2 (1+). Vicente Males MD Pathologist, Electronic Signature ( Signed 10/21/2016) 3. PROGNOSTIC INDICATORS For Part 3[facility] Results: IMMUNOHISTOCHEMICAL AND MORPHOMETRIC ANALYSIS PERFORMED MANUALLY Estrogen Receptor: 95%, POSITIVE, STRONG STAINING INTENSITY Progesterone Receptor: 45%, POSITIVE, STRONG STAINING INTENSITY Proliferation Marker Ki67: 15% ADDITIONAL INFORMATION:(continued) Results: HER2 - *EQUIVOCAL*. OF NOTE, A TOTAL OF 40 TUMOR CELLS WERE EVALUATED FOR HER2 EXPRESSION. HER2 BY IMMUNOHISTOCHEMISTRY WILL BE PERFORMED AND THE RESULTS REPORTED SEPARATELY. RATIO OF HER2/CEP17 SIGNALS 1.67 AVERAGE HER2 COPY NUMBER PER CELL 4.18   Surgery: 07/10/16 Diagnosis 1. 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 - SEE MICROSCOPIC DESCRIPTION. 2. 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 - SEE MICROSCOPIC DESCRIPTION. Microscopic Comment 1. Prognostic markers have been ordered and will be reported in an addendum. 2. Prognostic markers have been ordered and will be reported in an addendum. Called to the [facility] on 07/13/16. Results: IMMUNOHISTOCHEMICAL AND MORPHOMETRIC ANALYSIS PERFORMED MANUALLY Estrogen Receptor: 95%, POSITIVE, STRONG STAINING INTENSITY Progesterone Receptor: 80%, POSITIVE, STRONG STAINING INTENSITY  Biopsy: 02/25/15 Diagnosis 1. 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 - NO ATYPIA OR MALIGNANCY IDENTIFIED. - SEE COMMENT. 2. Lymph node, biopsy, 12 L - ONE BENIGN LYMPH NODE WITH NO TUMOR SEEN  (0/1). 3. Lymph node, biopsy, 9 L - ONE BENIGN LYMPH NODE WITH NO TUMOR SEEN (0/1). Microscopic Comment 1. A Congo Red stain is performed and a crystal violet stain is performed on the lung specimen. The staining pattern (apple green birefringence) coupled with the morphology is consistent with amyloid deposition. Dr. Zenda Alpers, Dr. Fletcher Anon and Dr. Darl Householder have all seen this case in consultation with agreement that amyloid deposition is present. Dr. Donato Heinz has also seen this case in consultation with agreement of the additional findings in the case.   MammaPrint 10/31/26 Result:  High Risk at 94.6% MPI: -0.368 Average 10-year risk of recurrence untreated: 29%   Genetics 07/29/16    PROCEDURES  ECHO 11/17/16 Study Conclusions - Left ventricle: The cavity size was normal. Wall thickness was   normal. Systolic function was normal. The estimated ejection   fraction was in the range of 60% to 65%. Wall motion was normal;   there were no regional wall motion abnormalities. Features are   consistent with a pseudonormal left ventricular filling pattern,   with concomitant abnormal relaxation and increased filling   pressure (grade 2 diastolic dysfunction). - Aortic valve: There was mild regurgitation. Valve area (VTI):   1.84 cm^2. Valve area (Vmax): 1.76 cm^2. Valve area (Vmean): 1.89   cm^2.    RADIOGRAPHIC STUDIES: I have personally reviewed the radiological images as listed and agreed with the findings in the report. No results found.   Whole Body Bone Scan 11/23/16 IMPRESSION: No definite scintigraphic  evidence of osseous metastatic disease as above.  CT CAP W Contrast 11/18/16 IMPRESSION: 1. Skin thickening and interstitial changes involving the left breast, likely related to radiation. Surgical changes are also noted with a tissue expander in place. Probable postop fluid collection in the upper outer quadrant breast. No enlarged supraclavicular or axillary lymph nodes. 2. No CT  findings suggesting metastatic disease involving the chest, abdomen or pelvis or bony structures. 3. Surgical changes from a left upper lobe wedge resection. No findings for recurrent tumor.  Diagnostic Mammogram 07/27/16 IMPRESSION: 1. Extensive mass and non mass enhancement along the lower inner aspect the left breast, lower outer quadrant, spanning 10.2 cm from anterior to posterior, including both biopsied lesions, as detailed above. This is all consistent with DCIS. 2. No other abnormal enhancement in the left breast. 3. No abnormal or enlarged axillary lymph nodes. 4. No evidence of malignancy in the right breast.  ASSESSMENT & PLAN:  Taylor Walsh is 62 y.o. who present to the clinic with Ductal carcinoma in situ (DCIS) of left breast  1. 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 - She had mostly DCIS in her breast but There was small component of invasive cancer that has spread to 2 of her lymph nodes.  Her surgical margins were negative. -Her mammaprint showed high-risk disease, luminal type B -Her staging scan was negative for distant metastasis, or residual axillary adenopathy. -She appeared adjuvant chemotherapy with dose dense AC for 4 cycles, followed by weekly Taxol for 12 weeks.  She had neutropenia required G-CSF, and peripheral neuropathy required Taxol dose reduction. -Recovering well from , still has mild persistent neuropathy.  Lab reviewed, CBC has normalized. -She has started adjuvant breast radiation, tolerating well so far. -Start her on adjuvant antiestrogen therapy after she completes radiation -We will see her back last week of her radiation.  2. Arthritis -She experiences mild arthritis in LE, more specific to her  right knee -She is pretty active   3. Osteopenia  -Last bone density scan ordered by Dr Brigitte Pulse in the last 2 years -Will f/u with PCP and scans every 2 years  4. Smoking cessation -patient stopped smoking but after close friend passing she has started again smoking.  -She smokes approximately 1 pack a week.  -The patient quit smoking in 07/2016 -She does have cough that persist depending on the weather. I refilled her Hycodan (03/03/17)  5. 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 The Hereditary Gene Panel offered by [facility] includes sequencing and/or deletion duplication testing of the following 46 genes: APC, ATM, AXIN2, BARD1, BMPR1A, BRCA1, BRCA2, BRIP1, CDH1, CDKN2A (p14ARF), CDKN2A (p16INK4a), CHEK2, CTNNA1, DICER1, EPCAM (Deletion/duplication testing only), GREM1 (promoter region deletion/duplication testing only), KIT, MEN1, MLH1, MSH2, MSH3, MSH6, MUTYH, NBN, NF1, NHTL1, PALB2, PDGFRA, PMS2, POLD1, POLE, PTEN, RAD50, RAD51C, RAD51D, SDHB, SDHC, SDHD, SMAD4, SMARCA4. STK11, TP53, TSC1, TSC2, and VHL.  The following gene was evaluated for sequence changes only: SDHA and HOXB13 c.251G>A variant only.  The report date is Aug 09, 2016.  6. 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 History of pulmonary amyloidosis -  She was found to have a lung nodule in October 2016, which was surgically removed, and showed pulmonary amyloidosis -Previous lab showed no evidence of systemic amyloidosis -Continue monitoring  8.  Chemo induced peripheral Neuropathy -Overall stable, and mild -I encouraged her to take vitamin B12 or B6.   PLAN:  -Lab reviewed, she has recovered well, except peripheral neuropathy -Continue radiation -See her the last week of her radiation, to  start antiestrogen therapy   All questions were answered. The patient knows to call the clinic with any problems, questions or concerns.     Truitt Merle, MD 05/10/2017

## 2017-05-07 ENCOUNTER — Ambulatory Visit
Admission: RE | Admit: 2017-05-07 | Discharge: 2017-05-07 | Disposition: A | Discharge status: Home / Self Care | Payer: BC Managed Care – PPO | Source: Ambulatory Visit | Attending: Radiation Oncology | Admitting: Radiation Oncology

## 2017-05-07 DIAGNOSIS — C50212 Malignant neoplasm of upper-inner quadrant of left female breast: Secondary | ICD-10-CM | POA: Diagnosis not present

## 2017-05-10 ENCOUNTER — Inpatient Hospital Stay: Payer: BC Managed Care – PPO | Attending: Hematology | Admitting: Hematology

## 2017-05-10 ENCOUNTER — Inpatient Hospital Stay: Payer: BC Managed Care – PPO

## 2017-05-10 ENCOUNTER — Ambulatory Visit
Admission: RE | Admit: 2017-05-10 | Discharge: 2017-05-10 | Disposition: A | Discharge status: Home / Self Care | Payer: BC Managed Care – PPO | Source: Ambulatory Visit | Attending: Radiation Oncology | Admitting: Radiation Oncology

## 2017-05-10 VITALS — BP 123/62 | HR 80 | Temp 97.9°F | Resp 18 | Ht 63.0 in | Wt 170.8 lb

## 2017-05-10 DIAGNOSIS — M8580 Other specified disorders of bone density and structure, unspecified site: Secondary | ICD-10-CM | POA: Diagnosis not present

## 2017-05-10 DIAGNOSIS — C50212 Malignant neoplasm of upper-inner quadrant of left female breast: Secondary | ICD-10-CM

## 2017-05-10 DIAGNOSIS — Z452 Encounter for adjustment and management of vascular access device: Secondary | ICD-10-CM | POA: Diagnosis not present

## 2017-05-10 DIAGNOSIS — Z170 Estrogen receptor positive status [ER+]: Secondary | ICD-10-CM

## 2017-05-10 DIAGNOSIS — D6481 Anemia due to antineoplastic chemotherapy: Secondary | ICD-10-CM | POA: Diagnosis not present

## 2017-05-10 DIAGNOSIS — G620 Drug-induced polyneuropathy: Secondary | ICD-10-CM | POA: Diagnosis not present

## 2017-05-10 DIAGNOSIS — Z95828 Presence of other vascular implants and grafts: Secondary | ICD-10-CM

## 2017-05-10 LAB — CBC WITH DIFFERENTIAL/PLATELET
BASOS PCT: 1 %
Basophils Absolute: 0 10*3/uL (ref 0.0–0.1)
EOS ABS: 0.2 10*3/uL (ref 0.0–0.5)
EOS PCT: 6 %
HCT: 39.4 % (ref 34.8–46.6)
Hemoglobin: 12.5 g/dL (ref 11.6–15.9)
Lymphocytes Relative: 18 %
Lymphs Abs: 0.7 10*3/uL — ABNORMAL LOW (ref 0.9–3.3)
MCH: 28.6 pg (ref 25.1–34.0)
MCHC: 31.7 g/dL (ref 31.5–36.0)
MCV: 90.2 fL (ref 79.5–101.0)
MONO ABS: 0.5 10*3/uL (ref 0.1–0.9)
Monocytes Relative: 12 %
Neutro Abs: 2.6 10*3/uL (ref 1.5–6.5)
Neutrophils Relative %: 63 %
PLATELETS: 198 10*3/uL (ref 145–400)
RBC: 4.37 MIL/uL (ref 3.70–5.45)
RDW: 16.8 % — AB (ref 11.2–14.5)
WBC: 4 10*3/uL (ref 3.9–10.3)

## 2017-05-10 LAB — COMPREHENSIVE METABOLIC PANEL
ALBUMIN: 3.8 g/dL (ref 3.5–5.0)
ALK PHOS: 64 U/L (ref 40–150)
ALT: 14 U/L (ref 0–55)
AST: 18 U/L (ref 5–34)
Anion gap: 8 (ref 3–11)
BILIRUBIN TOTAL: 0.6 mg/dL (ref 0.2–1.2)
BUN: 8 mg/dL (ref 7–26)
CALCIUM: 9.9 mg/dL (ref 8.4–10.4)
CO2: 28 mmol/L (ref 22–29)
CREATININE: 0.73 mg/dL (ref 0.60–1.10)
Chloride: 103 mmol/L (ref 98–109)
GFR calc Af Amer: >60 mL/min (ref >60)
GLUCOSE: 120 mg/dL (ref 70–140)
Potassium: 4.3 mmol/L (ref 3.5–5.1)
Sodium: 139 mmol/L (ref 136–145)
Total Protein: 7.1 g/dL (ref 6.4–8.3)

## 2017-05-10 MED ORDER — SODIUM CHLORIDE 0.9% FLUSH
10.0000 mL | Freq: Once | INTRAVENOUS | Status: DC
  Filled 2017-05-10: qty 10

## 2017-05-10 MED ORDER — HEPARIN SOD (PORK) LOCK FLUSH 100 UNIT/ML IV SOLN
500.0000 [IU] | Freq: Once | INTRAVENOUS | Status: DC
Start: 2017-05-10 — End: 2017-05-10
  Filled 2017-05-10: qty 5

## 2017-05-11 ENCOUNTER — Ambulatory Visit
Admission: RE | Admit: 2017-05-11 | Discharge: 2017-05-11 | Disposition: A | Discharge status: Home / Self Care | Payer: BC Managed Care – PPO | Source: Ambulatory Visit | Attending: Radiation Oncology | Admitting: Radiation Oncology

## 2017-05-11 ENCOUNTER — Telehealth: Payer: Self-pay | Admitting: Hematology

## 2017-05-11 DIAGNOSIS — C50212 Malignant neoplasm of upper-inner quadrant of left female breast: Secondary | ICD-10-CM | POA: Diagnosis not present

## 2017-05-11 NOTE — Telephone Encounter (Signed)
Mailed patient calendar of upcoming February and March appointments.

## 2017-05-12 ENCOUNTER — Ambulatory Visit
Admission: RE | Admit: 2017-05-12 | Discharge: 2017-05-12 | Disposition: A | Discharge status: Home / Self Care | Payer: BC Managed Care – PPO | Source: Ambulatory Visit | Attending: Radiation Oncology | Admitting: Radiation Oncology

## 2017-05-12 DIAGNOSIS — C50212 Malignant neoplasm of upper-inner quadrant of left female breast: Secondary | ICD-10-CM | POA: Diagnosis not present

## 2017-05-13 ENCOUNTER — Encounter: Payer: Self-pay | Admitting: Hematology

## 2017-05-13 ENCOUNTER — Ambulatory Visit
Admission: RE | Admit: 2017-05-13 | Discharge: 2017-05-13 | Disposition: A | Discharge status: Home / Self Care | Payer: BC Managed Care – PPO | Source: Ambulatory Visit | Attending: Radiation Oncology | Admitting: Radiation Oncology

## 2017-05-13 ENCOUNTER — Telehealth: Payer: Self-pay

## 2017-05-13 DIAGNOSIS — C50212 Malignant neoplasm of upper-inner quadrant of left female breast: Secondary | ICD-10-CM | POA: Diagnosis not present

## 2017-05-13 NOTE — Telephone Encounter (Signed)
Patient came by to pick up her scheduled appointment dates on Feb. 20th, and March 14th. Per 2/14 walk in visit.

## 2017-05-14 ENCOUNTER — Ambulatory Visit
Admission: RE | Admit: 2017-05-14 | Discharge: 2017-05-14 | Disposition: A | Discharge status: Home / Self Care | Payer: BC Managed Care – PPO | Source: Ambulatory Visit | Attending: Radiation Oncology | Admitting: Radiation Oncology

## 2017-05-14 DIAGNOSIS — C50212 Malignant neoplasm of upper-inner quadrant of left female breast: Secondary | ICD-10-CM | POA: Diagnosis not present

## 2017-05-17 ENCOUNTER — Ambulatory Visit
Admission: RE | Admit: 2017-05-17 | Discharge: 2017-05-17 | Disposition: A | Discharge status: Home / Self Care | Payer: BC Managed Care – PPO | Source: Ambulatory Visit | Attending: Radiation Oncology | Admitting: Radiation Oncology

## 2017-05-17 DIAGNOSIS — C50212 Malignant neoplasm of upper-inner quadrant of left female breast: Secondary | ICD-10-CM | POA: Diagnosis not present

## 2017-05-17 DIAGNOSIS — Z170 Estrogen receptor positive status [ER+]: Principal | ICD-10-CM

## 2017-05-17 MED ORDER — RADIAPLEXRX EX GEL
Freq: Once | CUTANEOUS | Status: AC
Start: 2017-05-17 — End: 2017-05-17
  Administered 2017-05-17: 09:00:00 via TOPICAL

## 2017-05-18 ENCOUNTER — Ambulatory Visit
Admission: RE | Admit: 2017-05-18 | Discharge: 2017-05-18 | Disposition: A | Discharge status: Home / Self Care | Payer: BC Managed Care – PPO | Source: Ambulatory Visit | Attending: Radiation Oncology | Admitting: Radiation Oncology

## 2017-05-18 DIAGNOSIS — C50212 Malignant neoplasm of upper-inner quadrant of left female breast: Secondary | ICD-10-CM | POA: Diagnosis not present

## 2017-05-19 ENCOUNTER — Inpatient Hospital Stay: Payer: BC Managed Care – PPO

## 2017-05-19 ENCOUNTER — Ambulatory Visit
Admission: RE | Admit: 2017-05-19 | Discharge: 2017-05-19 | Disposition: A | Discharge status: Home / Self Care | Payer: BC Managed Care – PPO | Source: Ambulatory Visit | Attending: Radiation Oncology | Admitting: Radiation Oncology

## 2017-05-19 DIAGNOSIS — Z170 Estrogen receptor positive status [ER+]: Secondary | ICD-10-CM

## 2017-05-19 DIAGNOSIS — C50212 Malignant neoplasm of upper-inner quadrant of left female breast: Secondary | ICD-10-CM

## 2017-05-19 DIAGNOSIS — Z95828 Presence of other vascular implants and grafts: Secondary | ICD-10-CM

## 2017-05-19 MED ORDER — HEPARIN SOD (PORK) LOCK FLUSH 100 UNIT/ML IV SOLN
500.0000 [IU] | Freq: Once | INTRAVENOUS | Status: AC
  Administered 2017-05-19: 500 [IU]
  Filled 2017-05-19: qty 5

## 2017-05-19 NOTE — Patient Instructions (Signed)
Implanted Port Home Guide An implanted port is a type of central line that is placed under the skin. Central lines are used to provide IV access when treatment or nutrition needs to be given through a person's veins. Implanted ports are used for long-term IV access. An implanted port may be placed because:  You need IV medicine that would be irritating to the small veins in your hands or arms.  You need long-term IV medicines, such as antibiotics.  You need IV nutrition for a long period.  You need frequent blood draws for lab tests.  You need dialysis.  Implanted ports are usually placed in the chest area, but they can also be placed in the upper arm, the abdomen, or the leg. An implanted port has two main parts:  Reservoir. The reservoir is round and will appear as a small, raised area under your skin. The reservoir is the part where a needle is inserted to give medicines or draw blood.  Catheter. The catheter is a thin, flexible tube that extends from the reservoir. The catheter is placed into a large vein. Medicine that is inserted into the reservoir goes into the catheter and then into the vein.  How will I care for my incision site? Do not get the incision site wet. Bathe or shower as directed by your health care provider. How is my port accessed? Special steps must be taken to access the port:  Before the port is accessed, a numbing cream can be placed on the skin. This helps numb the skin over the port site.  Your health care provider uses a sterile technique to access the port. ? Your health care provider must put on a mask and sterile gloves. ? The skin over your port is cleaned carefully with an antiseptic and allowed to dry. ? The port is gently pinched between sterile gloves, and a needle is inserted into the port.  Only "non-coring" port needles should be used to access the port. Once the port is accessed, a blood return should be checked. This helps ensure that the port  is in the vein and is not clogged.  If your port needs to remain accessed for a constant infusion, a clear (transparent) bandage will be placed over the needle site. The bandage and needle will need to be changed every week, or as directed by your health care provider.  Keep the bandage covering the needle clean and dry. Do not get it wet. Follow your health care provider's instructions on how to take a shower or bath while the port is accessed.  If your port does not need to stay accessed, no bandage is needed over the port.  What is flushing? Flushing helps keep the port from getting clogged. Follow your health care provider's instructions on how and when to flush the port. Ports are usually flushed with saline solution or a medicine called heparin. The need for flushing will depend on how the port is used.  If the port is used for intermittent medicines or blood draws, the port will need to be flushed: ? After medicines have been given. ? After blood has been drawn. ? As part of routine maintenance.  If a constant infusion is running, the port may not need to be flushed.  How long will my port stay implanted? The port can stay in for as long as your health care provider thinks it is needed. When it is time for the port to come out, surgery will be   done to remove it. The procedure is similar to the one performed when the port was put in. When should I seek immediate medical care? When you have an implanted port, you should seek immediate medical care if:  You notice a bad smell coming from the incision site.  You have swelling, redness, or drainage at the incision site.  You have more swelling or pain at the port site or the surrounding area.  You have a fever that is not controlled with medicine.  This information is not intended to replace advice given to you by your health care provider. Make sure you discuss any questions you have with your health care provider. Document  Released: 03/16/2005 Document Revised: 08/22/2015 Document Reviewed: 11/21/2012 [employer] Interactive Patient Education  2017 [employer]  

## 2017-05-20 ENCOUNTER — Ambulatory Visit
Admission: RE | Admit: 2017-05-20 | Discharge: 2017-05-20 | Disposition: A | Discharge status: Home / Self Care | Payer: BC Managed Care – PPO | Source: Ambulatory Visit | Attending: Radiation Oncology | Admitting: Radiation Oncology

## 2017-05-20 DIAGNOSIS — C50212 Malignant neoplasm of upper-inner quadrant of left female breast: Secondary | ICD-10-CM | POA: Diagnosis not present

## 2017-05-21 ENCOUNTER — Ambulatory Visit
Admission: RE | Admit: 2017-05-21 | Discharge: 2017-05-21 | Disposition: A | Discharge status: Home / Self Care | Payer: BC Managed Care – PPO | Source: Ambulatory Visit | Attending: Radiation Oncology | Admitting: Radiation Oncology

## 2017-05-21 DIAGNOSIS — C50212 Malignant neoplasm of upper-inner quadrant of left female breast: Secondary | ICD-10-CM | POA: Diagnosis not present

## 2017-05-24 ENCOUNTER — Ambulatory Visit
Admission: RE | Admit: 2017-05-24 | Discharge: 2017-05-24 | Disposition: A | Discharge status: Home / Self Care | Payer: BC Managed Care – PPO | Source: Ambulatory Visit | Attending: Radiation Oncology | Admitting: Radiation Oncology

## 2017-05-24 ENCOUNTER — Encounter: Payer: Self-pay | Admitting: Radiation Oncology

## 2017-05-24 DIAGNOSIS — C50212 Malignant neoplasm of upper-inner quadrant of left female breast: Secondary | ICD-10-CM | POA: Diagnosis not present

## 2017-05-24 DIAGNOSIS — Z170 Estrogen receptor positive status [ER+]: Principal | ICD-10-CM

## 2017-05-25 ENCOUNTER — Ambulatory Visit
Admission: RE | Admit: 2017-05-25 | Discharge: 2017-05-25 | Disposition: A | Discharge status: Home / Self Care | Payer: BC Managed Care – PPO | Source: Ambulatory Visit | Attending: Radiation Oncology | Admitting: Radiation Oncology

## 2017-05-25 DIAGNOSIS — C50212 Malignant neoplasm of upper-inner quadrant of left female breast: Secondary | ICD-10-CM | POA: Diagnosis not present

## 2017-05-26 ENCOUNTER — Ambulatory Visit
Admission: RE | Admit: 2017-05-26 | Discharge: 2017-05-26 | Disposition: A | Discharge status: Home / Self Care | Payer: BC Managed Care – PPO | Source: Ambulatory Visit | Attending: Radiation Oncology | Admitting: Radiation Oncology

## 2017-05-26 DIAGNOSIS — C50212 Malignant neoplasm of upper-inner quadrant of left female breast: Secondary | ICD-10-CM | POA: Diagnosis not present

## 2017-05-27 ENCOUNTER — Ambulatory Visit
Admission: RE | Admit: 2017-05-27 | Discharge: 2017-05-27 | Disposition: A | Discharge status: Home / Self Care | Payer: BC Managed Care – PPO | Source: Ambulatory Visit | Attending: Radiation Oncology | Admitting: Radiation Oncology

## 2017-05-27 DIAGNOSIS — C50212 Malignant neoplasm of upper-inner quadrant of left female breast: Secondary | ICD-10-CM | POA: Diagnosis not present

## 2017-05-28 ENCOUNTER — Ambulatory Visit
Admission: RE | Admit: 2017-05-28 | Discharge: 2017-05-28 | Disposition: A | Discharge status: Home / Self Care | Payer: BC Managed Care – PPO | Source: Ambulatory Visit | Attending: Radiation Oncology | Admitting: Radiation Oncology

## 2017-05-28 DIAGNOSIS — C50212 Malignant neoplasm of upper-inner quadrant of left female breast: Secondary | ICD-10-CM | POA: Insufficient documentation

## 2017-05-28 DIAGNOSIS — Z170 Estrogen receptor positive status [ER+]: Secondary | ICD-10-CM | POA: Diagnosis not present

## 2017-05-28 DIAGNOSIS — Z510 Encounter for antineoplastic radiation therapy: Secondary | ICD-10-CM | POA: Insufficient documentation

## 2017-05-31 ENCOUNTER — Ambulatory Visit
Admission: RE | Admit: 2017-05-31 | Discharge: 2017-05-31 | Disposition: A | Discharge status: Home / Self Care | Payer: BC Managed Care – PPO | Source: Ambulatory Visit | Attending: Radiation Oncology | Admitting: Radiation Oncology

## 2017-05-31 ENCOUNTER — Ambulatory Visit: Payer: BC Managed Care – PPO | Admitting: Radiation Oncology

## 2017-05-31 DIAGNOSIS — C50212 Malignant neoplasm of upper-inner quadrant of left female breast: Secondary | ICD-10-CM | POA: Diagnosis not present

## 2017-06-01 ENCOUNTER — Ambulatory Visit
Admission: RE | Admit: 2017-06-01 | Discharge: 2017-06-01 | Disposition: A | Discharge status: Home / Self Care | Payer: BC Managed Care – PPO | Source: Ambulatory Visit | Attending: Radiation Oncology | Admitting: Radiation Oncology

## 2017-06-01 DIAGNOSIS — C50212 Malignant neoplasm of upper-inner quadrant of left female breast: Secondary | ICD-10-CM | POA: Diagnosis not present

## 2017-06-02 ENCOUNTER — Ambulatory Visit
Admission: RE | Admit: 2017-06-02 | Discharge: 2017-06-02 | Disposition: A | Discharge status: Home / Self Care | Payer: BC Managed Care – PPO | Source: Ambulatory Visit | Attending: Radiation Oncology | Admitting: Radiation Oncology

## 2017-06-02 ENCOUNTER — Ambulatory Visit: Payer: BC Managed Care – PPO | Admitting: Radiation Oncology

## 2017-06-02 DIAGNOSIS — C50212 Malignant neoplasm of upper-inner quadrant of left female breast: Secondary | ICD-10-CM | POA: Diagnosis not present

## 2017-06-03 ENCOUNTER — Ambulatory Visit
Admission: RE | Admit: 2017-06-03 | Discharge: 2017-06-03 | Disposition: A | Discharge status: Home / Self Care | Payer: BC Managed Care – PPO | Source: Ambulatory Visit | Attending: Radiation Oncology | Admitting: Radiation Oncology

## 2017-06-03 DIAGNOSIS — C50212 Malignant neoplasm of upper-inner quadrant of left female breast: Secondary | ICD-10-CM | POA: Diagnosis not present

## 2017-06-04 ENCOUNTER — Ambulatory Visit
Admission: RE | Admit: 2017-06-04 | Discharge: 2017-06-04 | Disposition: A | Discharge status: Home / Self Care | Payer: BC Managed Care – PPO | Source: Ambulatory Visit | Attending: Radiation Oncology | Admitting: Radiation Oncology

## 2017-06-04 ENCOUNTER — Ambulatory Visit: Payer: BC Managed Care – PPO | Admitting: Radiation Oncology

## 2017-06-04 DIAGNOSIS — C50212 Malignant neoplasm of upper-inner quadrant of left female breast: Secondary | ICD-10-CM | POA: Diagnosis not present

## 2017-06-07 ENCOUNTER — Ambulatory Visit
Admission: RE | Admit: 2017-06-07 | Discharge: 2017-06-07 | Disposition: A | Discharge status: Home / Self Care | Payer: BC Managed Care – PPO | Source: Ambulatory Visit | Attending: Radiation Oncology | Admitting: Radiation Oncology

## 2017-06-07 DIAGNOSIS — C50212 Malignant neoplasm of upper-inner quadrant of left female breast: Secondary | ICD-10-CM | POA: Diagnosis not present

## 2017-06-08 ENCOUNTER — Ambulatory Visit
Admission: RE | Admit: 2017-06-08 | Discharge: 2017-06-08 | Disposition: A | Discharge status: Home / Self Care | Payer: BC Managed Care – PPO | Source: Ambulatory Visit | Attending: Radiation Oncology | Admitting: Radiation Oncology

## 2017-06-08 DIAGNOSIS — C50212 Malignant neoplasm of upper-inner quadrant of left female breast: Secondary | ICD-10-CM | POA: Diagnosis not present

## 2017-06-08 NOTE — Progress Notes (Signed)
[place]  [phone] [phone]) [phone]  Clinic Follow-up Note   Patient Care Team: Marton Redwood, MD as PCP - General (Internal Medicine) Melrose Nakayama, MD as Consulting Physician (Cardiothoracic Surgery) Fanny Skates, MD as Consulting Physician (General Surgery) Truitt Merle, MD as Consulting Physician (Hematology)   Date of Service:  05/10/2017  CHIEF COMPLAINTS F/U left breast cancer   Oncology History   Cancer Staging Breast cancer of upper-inner quadrant of left female breast [facility]) Staging form: Breast, AJCC 8th Edition - Clinical stage from 07/10/2016: Stage 0 (cTis (DCIS), cN0, cM0, G3, ER: Positive, PR: Positive, HER2: Not Assessed) - Signed by Truitt Merle, MD on 07/28/2016 - Pathologic stage from 10/26/2016: Stage IA (pT1a, pN1a, cM0, G2, ER: Positive, PR: Positive, HER2: Negative) - Signed by Truitt Merle, MD on 11/11/2016       Breast cancer of upper-inner quadrant of left female breast ([place])   07/10/2016 Initial Biopsy    Diagnosis 1. 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 2. 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      07/10/2016 Receptors her2    ER 95%, PR 80-90%+, both strong staining, Ki67 80-90%      07/22/2016 Initial Diagnosis    Ductal carcinoma in situ (DCIS) of left breast      07/27/2016 Imaging    Breast MRI w wo contrast showed  1. Extensive mass and non mass enhancement along the lower inner aspect the left breast, lower outer quadrant, spanning 10.2 cm from anterior to posterior, including both biopsied lesions, as detailed above. This is all consistent with DCIS. 2. No other abnormal enhancement in the left breast. 3. No abnormal or enlarged axillary lymph nodes. 4. No evidence of malignancy  in the right breast      08/10/2016 Genetic Testing    ATM c.[id number] VUS identified on the Common Hereditary Cancer panel. The Hereditary Gene Panel offered by [facility] includes sequencing and/or deletion duplication testing of the following 46 genes: APC, ATM, AXIN2, BARD1, BMPR1A, BRCA1, BRCA2, BRIP1, CDH1, CDKN2A (p14ARF), CDKN2A (p16INK4a), CHEK2, CTNNA1, DICER1, EPCAM (Deletion/duplication testing only), GREM1 (promoter region deletion/duplication testing only), KIT, MEN1, MLH1, MSH2, MSH3, MSH6, MUTYH, NBN, NF1, NHTL1, PALB2, PDGFRA, PMS2, POLD1, POLE, PTEN, RAD50, RAD51C, RAD51D, SDHB, SDHC, SDHD, SMAD4, SMARCA4. STK11, TP53, TSC1, TSC2, and VHL.  The following gene was evaluated for sequence changes only: SDHA and HOXB13 c.251G>A variant only.  The report date is Aug 09, 2016.       10/13/2016 Surgery    LEFT TOTAL MASTECTOMY WITH LEFT AXILLARY SENTINEL LYMPH NODE BIOPSY By Dr. Ninfa Linden      10/13/2016 Pathology Results    Surgery Diagnosis 10/13/16  1. Lymph node, sentinel, biopsy, Left Axillary - METASTATIC CARCINOMA IN ONE LYMPH NODE (1/1). 2. Lymph node, sentinel, biopsy, Left - METASTATIC CARCINOMA IN ONE LYMPH NODE (1/1). 3. Breast, radical mastectomy (including lymph nodes), Left - INVASIVE DUCTAL CARCINOMA, 0.3 CM. - LYMPHOVASCULAR INVOLVEMENT BY CARCINOMA. - HIGH GRADE DUCTAL CARCINOMA IN SITU WITH CALCIFICATIONS AND NECROSIS, 5.5 CM. - DCIS FOCALLY 0.1 CM FROM CAUTERIZED MEDIAL MARGIN. - SEE ONCOLOGY TABLE AND COMMENT. 4. Skin , Left additional mastectomy flap - BENIGN FIBROADIPOSE TISSUE. - NO EVIDENCE OF MALIGNANCY.      10/30/2016 Miscellaneous    MammaPrint 10/31/26 Result:  High Risk,Luminal type  B MPI: -0.368 Average 10-year risk of recurrence untreated: 29%      11/18/2016 Imaging    CT CAP W Contrast 11/18/16 IMPRESSION: 1. Skin thickening and interstitial changes involving the left breast, likely related to radiation. Surgical changes are also noted with a  tissue expander in place. Probable postop fluid collection in the upper outer quadrant breast. No enlarged supraclavicular or axillary lymph nodes. 2. No CT findings suggesting metastatic disease involving the chest, abdomen or pelvis or bony structures. 3. Surgical changes from a left upper lobe wedge resection. No findings for recurrent tumor.      11/23/2016 Imaging    Bone Scan Whole body 11/23/16 IMPRESSION: No definite scintigraphic evidence of osseous metastatic disease as above.      11/25/2016 - 04/07/2017 Chemotherapy    Adjuvant chemotherapy AC every 2 weeks for 4 cycles, 11/25/16-01/05/17, followed by Taxol weekly for 12 cycles starting 01/20/17       04/28/2017 -  Radiation Therapy     Radiation therapy with Dr. Isidore Moos        HISTORY OF PRESENTING ILLNESS (07/28/16):  Taylor Walsh 62 y.o. 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 She presents to the clinic today by herself. She was referred by her surgeon Dr. Dalbert Batman.   This was discovered by routine screening mammogram. She denies any palpable breast mass or adenopathy. She denies any other new symptoms. She was contacted for her need of a biopsy to further investigate the calcification found in her mammogram. A biopsy was done leading to a diagnosis of her DCIS. She reports having arthritis in the right knee. She reported having a tubal ligation and hysterectomy. She also has family history of breast cancer with mother and sister. She reports to live with her daughter and 2 of her grandchildren. She reports of having history of HTN, arthritis, Thyroidism, being Pre-diabetic, Osteopenia and is currently back to Smoking. She also reports her brother has a history of blood clots and he was told that it was hereditary which she is following up with Dr. Brigitte Pulse about testing.   GYN HISTORY  Menarchal: 11 LMP: 36 before hysterectomy Contraceptive: BN/A HRT: no GP:G2P2    PREVIOUS THERAPY: Adjuvant  chemotherapy AC every 2 weeks for 4 cycles 11/25/16-01/05/17   CURRENT THERAPY: Radiation therapy started on 04/28/17, plan to complete on 06/11/17. Start Anastrozole beginning of April 2019  INTERVAL HISTORY:   Taylor Walsh is here for a follow up. She presents to clinic by herself. She reports she is almost finished with radiation and has 2 days left. She notes to have significant burns that are uncomfortable. She is seeing Dr. Isidore Moos in 2 days and will get an extra refill of her cream. She also notes to have discharge and dryness around her finger nails. She reports continued neuropathy in her fingers and feet. She reports she will follow up with her surgeon next month for port removal.   On review of systems, pt denies new pain, or any other complaints at this time. Pertinent positives are listed and detailed within the above HPI.   MEDICAL HISTORY:  Past Medical History:  Diagnosis Date  . Anxiety   . Arthritis   . Cancer ([place]) 10/08/2016   left breast cancer  . Diabetes mellitus without complication ([place])   . Dyslipidemia   . Family history of breast cancer   . Family history of ovarian cancer   . Family history of prostate cancer   .  GERD (gastroesophageal reflux disease)    nexium if needed  . Heart murmur   . Hypertension   . Hypothyroid   . Osteopenia   . Pneumonia 10/08/2016   June 2013    SURGICAL HISTORY: Past Surgical History:  Procedure Laterality Date  . ABDOMINAL HYSTERECTOMY    . BREAST RECONSTRUCTION WITH PLACEMENT OF TISSUE EXPANDER AND FLEX HD (ACELLULAR HYDRATED DERMIS) Left 10/13/2016   Procedure: LEFT BREAST RECONSTRUCTION WITH PLACEMENT OF TISSUE EXPANDER AND ALLODERM;  Surgeon: Irene Limbo, MD;  Location: [facility];  Service: Plastics;  Laterality: Left;  . FOOT SURGERY     bi-lat/ repaired hammer toes  . MASTECTOMY W/ SENTINEL NODE BIOPSY Left 10/13/2016  . MASTECTOMY W/ SENTINEL NODE BIOPSY Left 10/13/2016   Procedure: LEFT TOTAL MASTECTOMY WITH LEFT  AXILLARY SENTINEL LYMPH NODE BIOPSY;  Surgeon: Coralie Keens, MD;  Location: [facility];  Service: General;  Laterality: Left;  . PORTACATH PLACEMENT Right 11/24/2016   Procedure: INSERTION PORT-A-CATH;  Surgeon: Fanny Skates, MD;  Location: [place] [facility];  Service: General;  Laterality: Right;  . TUBAL LIGATION    . VIDEO ASSISTED THORACOSCOPY (VATS)/WEDGE RESECTION Left 02/25/2015   Procedure: VIDEO ASSISTED THORACOSCOPY (VATS)/ LUL WEDGE RESECTION with ON Q placement.;  Surgeon: Melrose Nakayama, MD;  Location: [facility];  Service: Thoracic;  Laterality: Left;    SOCIAL HISTORY: Social History   Socioeconomic History  . Marital status: Single    Spouse name: Not on file  . Number of children: 2  . Years of education: Not on file  . Highest education level: Not on file  Social Needs  . Financial resource strain: Not on file  . Food insecurity - worry: Not on file  . Food insecurity - inability: Not on file  . Transportation needs - medical: Not on file  . Transportation needs - non-medical: Not on file  Occupational History  . Not on file  Tobacco Use  . Smoking status: Former Smoker    Packs/day: 1.00    Years: 42.00    Pack years: 42.00    Last attempt to quit: 08/27/2016    Years since quitting: 0.7  . Smokeless tobacco: Never Used  . Tobacco comment: 5/1/18she smokes occasionally when stressed. She is smoking 4 cigarettes a week  Substance and Sexual Activity  . Alcohol use: No    Alcohol/week: 0.0 oz    Comment: once or twice yearly.  . Drug use: No  . Sexual activity: Not on file  Other Topics Concern  . Not on file  Social History Narrative  . Not on file    FAMILY HISTORY: Family History  Problem Relation Age of Onset  . Cancer Mother        brain, ovarian  . Hypertension Mother   . Stroke Father   . Hypertension Father   . Cancer Sister 21       bilateral breast cancer, 2nd cancer at 8  . Cancer Brother        dx with prostate cancer in  the 29s  . Cancer Brother        dx in his 75s with prostate cancer  . Brain cancer Maternal Uncle   . Cirrhosis Brother   . Cancer Brother        NOS  . Cancer Paternal Aunt        NOS  . Cancer Cousin        paternal cousin with cancer NOS    ALLERGIES:  is allergic to vicodin [hydrocodone-acetaminophen].  MEDICATIONS:  Current Outpatient Medications  Medication Sig Dispense Refill  . anastrozole (ARIMIDEX) 1 MG tablet Take 1 tablet (1 mg total) by mouth daily. 30 tablet 3  . aspirin 81 MG tablet Take 81 mg by mouth daily.    Marland Kitchen atorvastatin (LIPITOR) 40 MG tablet Take 40 mg by mouth daily.    . calcium-vitamin D 250-100 MG-UNIT tablet Take 2 tablets by mouth daily.     . cephALEXin (KEFLEX) 500 MG capsule Take 1 capsule (500 mg total) by mouth 4 (four) times daily. 28 capsule 0  . ergocalciferol (VITAMIN D2) 50000 UNITS capsule Take 50,000 Units by mouth every Friday.     . gabapentin (NEURONTIN) 300 MG capsule 300 mg PO QHS 30 capsule 2  . HYDROcodone-acetaminophen (NORCO) 5-325 MG tablet Take 1-2 tablets by mouth every 6 (six) hours as needed for moderate pain or severe pain. 20 tablet 0  . ibuprofen (ADVIL,MOTRIN) 600 MG tablet Take 1 tablet (600 mg total) by mouth every 8 (eight) hours as needed. Take with food to protect stomach. 36 tablet 0  . levothyroxine (SYNTHROID, LEVOTHROID) 88 MCG tablet Take 88 mcg by mouth daily before breakfast.     . lidocaine-prilocaine (EMLA) cream Apply to affected area once 30 g 3  . magic mouthwash w/lidocaine SOLN Take 5 mLs by mouth 3 (three) times daily as needed for mouth pain. (Patient not taking: Reported on 04/20/2017) 240 mL 1  . metFORMIN (GLUCOPHAGE) 500 MG tablet Take 1 tablet (500 mg total) 2 (two) times daily with a meal by mouth. 60 tablet 2  . methocarbamol (ROBAXIN) 500 MG tablet Take 1 tablet (500 mg total) by mouth every 8 (eight) hours as needed for muscle spasms. (Patient not taking: Reported on 03/03/2017) 30 tablet 0  .  mupirocin ointment (BACTROBAN) 2 % Apply under nails with cotton swab TID 30 g 1  . olmesartan (BENICAR) 20 MG tablet Take 20 mg by mouth daily.    . ondansetron (ZOFRAN) 8 MG tablet Take 1 tablet (8 mg total) by mouth 2 (two) times daily as needed. Start on the third day after chemotherapy. (Patient not taking: Reported on 04/09/2017) 30 tablet 1  . prochlorperazine (COMPAZINE) 10 MG tablet Take 1 tablet (10 mg total) by mouth every 6 (six) hours as needed (Nausea or vomiting). (Patient not taking: Reported on 02/17/2017) 30 tablet 1  . traMADol (ULTRAM) 50 MG tablet Take 1 tablet (50 mg total) by mouth every 12 (twelve) hours as needed for moderate pain or severe pain. 15 tablet 0   No current facility-administered medications for this visit.     REVIEW OF SYSTEMS:  Constitutional: Denies fevers, chills  Eyes: Denies blurriness of vision, double vision or watery eyes. Denies eye pain, redness, or itching  Ears, nose, mouth, throat, and face: Denies mucositis or nasal congestion  Respiratory: (+) occasional cough  Cardiovascular: Denies palpitation, chest discomfort or lower extremity swelling Gastrointestinal:  Denies vomiting, constipation, diarrhea, or change in bowel habits  GU/GYN: recurrent herpes outbreak, improving, some residual sensitivity Skin: Denies abnormal skin rashes (+) growing fingernails (+) skin redness/burns from radiation Lymphatics: Denies new lymphadenopathy or easy bruising Musculoskeletal: (+) arthritis in the right knee.  Neurological:Denies new weaknesses (+) mild numbness in her fingertips, increase in neuropathy in her feet (+) change in balance  Breast: negative  Behavioral/Psych: Mood is stable, no new changes  All other systems were reviewed with the patient and are negative.  PHYSICAL  EXAMINATION: ECOG PERFORMANCE STATUS: 1  Vitals:   06/09/17 1312  BP: 133/70  Pulse: 71  Resp: 18  Temp: 97.7 F (36.5 C)  SpO2: 100%   Filed Weights   06/09/17  1312  Weight: 173 lb 6.4 oz (78.7 kg)     GENERAL:alert, no distress and comfortable SKIN: skin color, texture, turgor are normal, no rashes, (+) diffuse nail thickening and discoloration, no discharge   EYES: normal, conjunctiva are pink and non-injected, sclera clear OROPHARYNX:no exudate, no erythema and lips, buccal mucosa, and tongue normal . No obvious mucositis NECK: supple, thyroid normal size, non-tender, without nodularity LYMPH:  no palpable  Cervical, supraclavicular, or axillary lymphadenopathy LUNGS: clear to auscultation bilaterally with normal breathing effort HEART: regular rate & rhythm and no murmurs and no lower extremity edema ABDOMEN:abdomen soft, non-tender and normal bowel sounds. No palpable hepatomegaly or masses GU/GYN: external exam reveals healing herpetic lesions to left labia minora Musculoskeletal:no cyanosis of digits and no clubbing  PSYCH: alert & oriented x 3 with fluent speech NEURO: no focal motor (+) mild sensory deficit in fingers, L>R Breasts: Breast inspection showed them to be symmetrical with no nipple discharge. (+) S/p left mastectomy with tissue expander placement with incision well-healed, no tenderness.  (+) diffuse hyperpigmentation over the left breast with mild amount of skin peeling in the left axilla and below/under the breast  LABORATORY DATA:  I have reviewed the data as listed CBC Latest Ref Rng & Units 05/10/2017 04/07/2017 03/31/2017  WBC 3.9 - 10.3 K/uL 4.0 4.1 4.1  Hemoglobin 11.6 - 15.9 g/dL 12.5 11.1(L) 11.0(L)  Hematocrit 34.8 - 46.6 % 39.4 35.6 33.0(L)  Platelets 145 - 400 K/uL 198 299 283    CMP Latest Ref Rng & Units 05/10/2017 04/07/2017 03/31/2017  Glucose 70 - 140 mg/dL 120 157(H) 127  BUN 7 - 26 mg/dL 8 7 11.1  Creatinine 0.60 - 1.10 mg/dL 0.73 0.76 0.7  Sodium 136 - 145 mmol/L 139 137 139  Potassium 3.5 - 5.1 mmol/L 4.3 4.2 3.8  Chloride 98 - 109 mmol/L 103 103 -  CO2 22 - 29 mmol/L '28 24 25  '$ Calcium 8.4 - 10.4 mg/dL  9.9 9.7 9.4  Total Protein 6.4 - 8.3 g/dL 7.1 6.6 6.9  Total Bilirubin 0.2 - 1.2 mg/dL 0.6 0.8 1.04  Alkaline Phos 40 - 150 U/L 64 67 68  AST 5 - 34 U/L '18 18 20  '$ ALT 0 - 55 U/L '14 15 20    '$ PATHOLOGY:   Surgery Diagnosis 10/13/16  1. Lymph node, sentinel, biopsy, Left Axillary - METASTATIC CARCINOMA IN ONE LYMPH NODE (1/1). 2. Lymph node, sentinel, biopsy, Left - METASTATIC CARCINOMA IN ONE LYMPH NODE (1/1). 3. Breast, radical mastectomy (including lymph nodes), Left - INVASIVE DUCTAL CARCINOMA, 0.3 CM. - LYMPHOVASCULAR INVOLVEMENT BY CARCINOMA. - HIGH GRADE DUCTAL CARCINOMA IN SITU WITH CALCIFICATIONS AND NECROSIS, 5.5 CM. - DCIS FOCALLY 0.1 CM FROM CAUTERIZED MEDIAL MARGIN. - SEE ONCOLOGY TABLE AND COMMENT. 4. Skin , Left additional mastectomy flap - BENIGN FIBROADIPOSE TISSUE. - NO EVIDENCE OF MALIGNANCY. Microscopic Comment 3. BREAST, INVASIVE TUMOR Procedure: Mastectomy and two sentinel lymph nodes with additional left mastectomy flap. Laterality: Left breast Tumor Size: 0.3 cm invasive carcinoma; 5.5 cm DCIS Histologic Type: Ductal Grade: II Tubular Differentiation: 2 Nuclear Pleomorphism: 2 Mitotic Count: 2 Ductal Carcinoma in Situ (DCIS): Present, high grade with necrosis Extent of Tumor: Skin: Free of tumor Nipple: Free of tumor Microscopic Comment(continued) Skeletal muscle: Free of tumor Margins:  Free of tumor Invasive carcinoma, distance from closest margin: Greater than 1.5 cm DCIS, distance from closest margin: Less than 0.1 cm from medial margin Regional Lymph Nodes: Number of Lymph Nodes Examined: 2 Number of Sentinel Lymph Nodes Examined: 2 Lymph Nodes with Macrometastases: 2 Lymph Nodes with Micrometastases: 0 Lymph Nodes with Isolated Tumor Cells: 0 Breast Prognostic Profile: Pending Estrogen Receptor: Pending Progesterone Receptor: Pending Her2: Pending Ki-67: Pending Best tumor block for sendout testing: 3[facility] Pathologic Stage Classification  (pTNM, AJCC 8th Edition): Primary Tumor (pT): pT1a Regional Lymph Nodes (pN): pN1a Distant Metastases (pM): pMX Comments: There is extensive high grade ductal carcinoma in situ with necrosis. Multiple additional portions of the tissue are examined and there is a 0.3 cm invasive ductal carcinoma associated with lymphovascular involvement by tumor. Breast prognostic profile will be performed on the invasive carcinoma and reported as an addendum. Immunohistochemistry shows positive basal cell staining in the DCIS with calponin, smooth muscle myosin and p63. ADDITIONAL INFORMATION: 3. By immunohistochemistry, the tumor cells are Negative for Her2 (1+). Vicente Males MD Pathologist, Electronic Signature ( Signed 10/21/2016) 3. PROGNOSTIC INDICATORS For Part 3[facility] Results: IMMUNOHISTOCHEMICAL AND MORPHOMETRIC ANALYSIS PERFORMED MANUALLY Estrogen Receptor: 95%, POSITIVE, STRONG STAINING INTENSITY Progesterone Receptor: 45%, POSITIVE, STRONG STAINING INTENSITY Proliferation Marker Ki67: 15% ADDITIONAL INFORMATION:(continued) Results: HER2 - *EQUIVOCAL*. OF NOTE, A TOTAL OF 40 TUMOR CELLS WERE EVALUATED FOR HER2 EXPRESSION. HER2 BY IMMUNOHISTOCHEMISTRY WILL BE PERFORMED AND THE RESULTS REPORTED SEPARATELY. RATIO OF HER2/CEP17 SIGNALS 1.67 AVERAGE HER2 COPY NUMBER PER CELL 4.18   Surgery: 07/10/16 Diagnosis 1. 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 - SEE MICROSCOPIC DESCRIPTION. 2. 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 - SEE MICROSCOPIC DESCRIPTION. Microscopic Comment 1. Prognostic markers have been ordered and will be reported in an addendum. 2. Prognostic markers have been ordered and will be reported in an addendum. Called to the [facility] on  07/13/16. Results: IMMUNOHISTOCHEMICAL AND MORPHOMETRIC ANALYSIS PERFORMED MANUALLY Estrogen Receptor: 95%, POSITIVE, STRONG STAINING INTENSITY Progesterone Receptor: 80%, POSITIVE, STRONG STAINING INTENSITY  Biopsy: 02/25/15 Diagnosis 1. 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 - NO ATYPIA OR MALIGNANCY IDENTIFIED. - SEE COMMENT. 2. Lymph node, biopsy, 12 L - ONE BENIGN LYMPH NODE WITH NO TUMOR SEEN (0/1). 3. Lymph node, biopsy, 9 L - ONE BENIGN LYMPH NODE WITH NO TUMOR SEEN (0/1). Microscopic Comment 1. A Congo Red stain is performed and a crystal violet stain is performed on the lung specimen. The staining pattern (apple green birefringence) coupled with the morphology is consistent with amyloid deposition. Dr. Zenda Alpers, Dr. Fletcher Anon and Dr. Darl Householder have all seen this case in consultation with agreement that amyloid deposition is present. Dr. Donato Heinz has also seen this case in consultation with agreement of the additional findings in the case.   MammaPrint 10/31/26 Result:  High Risk at 94.6% MPI: -0.368 Average 10-year risk of recurrence untreated: 29%   Genetics 07/29/16    PROCEDURES  ECHO 11/17/16 Study Conclusions - Left ventricle: The cavity size was normal. Wall thickness was   normal. Systolic function was normal. The estimated ejection   fraction was in the range of 60% to 65%. Wall motion was normal;   there were no regional wall motion abnormalities.  Features are   consistent with a pseudonormal left ventricular filling pattern,   with concomitant abnormal relaxation and increased filling   pressure (grade 2 diastolic dysfunction). - Aortic valve: There was mild regurgitation. Valve area (VTI):   1.84 cm^2. Valve area (Vmax): 1.76 cm^2. Valve area (Vmean): 1.89   cm^2.    RADIOGRAPHIC STUDIES: I have personally reviewed the radiological  images as listed and agreed with the findings in the report. No results found.   Whole Body Bone Scan 11/23/16 IMPRESSION: No definite scintigraphic evidence of osseous metastatic disease as above.  CT CAP W Contrast 11/18/16 IMPRESSION: 1. Skin thickening and interstitial changes involving the left breast, likely related to radiation. Surgical changes are also noted with a tissue expander in place. Probable postop fluid collection in the upper outer quadrant breast. No enlarged supraclavicular or axillary lymph nodes. 2. No CT findings suggesting metastatic disease involving the chest, abdomen or pelvis or bony structures. 3. Surgical changes from a left upper lobe wedge resection. No findings for recurrent tumor.  Diagnostic Mammogram 07/27/16 IMPRESSION: 1. Extensive mass and non mass enhancement along the lower inner aspect the left breast, lower outer quadrant, spanning 10.2 cm from anterior to posterior, including both biopsied lesions, as detailed above. This is all consistent with DCIS. 2. No other abnormal enhancement in the left breast. 3. No abnormal or enlarged axillary lymph nodes. 4. No evidence of malignancy in the right breast.  ASSESSMENT & PLAN:  Taylor Walsh is 62 y.o. who present to the clinic with Ductal carcinoma in situ (DCIS) of left breast  1. 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 -She had mostly DCIS in her breast but There was small component of invasive cancer that has spread to 2 of her lymph nodes.  Her surgical margins were negative. -Her mammaprint showed high-risk disease, luminal type B -Her staging scan was negative for distant metastasis, or residual axillary adenopathy. -She appeared adjuvant  chemotherapy with dose dense AC for 4 cycles, followed by weekly Taxol for 12 weeks.  She had neutropenia required G-CSF, and peripheral neuropathy required Taxol dose reduction. -Recovering well from her chemo, still has mild persistent neuropathy.  -She has started adjuvant breast radiation, tolerating well so far. Plan to complete on 06/11/17 -I plan to start her on adjuvant antiestrogen therapy after she completes radiation in a few weeks. We discussed her options today and side effects of an aromatase inhibitor. I plan to start her on Anastrozole for a total of 7-10 years due to her high risk disease.  - The potential benefit and side effects, which includes but not limited to, hot flash, skin and vaginal dryness, metabolic changes ( increased blood glucose, cholesterol, weight, etc.), slightly in increased risk of cardiovascular disease, cataracts, muscular and joint discomfort, osteopenia and osteoporosis, etc, were discussed with her in great details. She is interested, and agree wit start in 2 weeks when she recovers well from RT  -Labs reviewed with the pt, they are WNL.  -Mammogram in 06/2017  -[facility] in 2 months -F/u in 5 months  2. Arthritis -She experiences mild arthritis in LE, more specific to her right knee -She is pretty active -Will monitor while on Anastrozole    3. Osteopenia  -Last bone density scan ordered by Dr Brigitte Pulse  in the last 2 years -Will f/u with PCP and scans every 2 years -I explained that Anastrozole can weaken her bone and she will f/u with Dr. Brigitte Pulse to make sure she is up to date on DEXA  4. Smoking cessation -She use to smoke approximately 1 pack a week.  -The patient quit smoking in 07/2016 -She does have cough that persist depending on the weather. I refilled her Hycodan on (03/03/17)  5. 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 The Hereditary Gene Panel offered by [facility] includes sequencing and/or deletion  duplication testing of the following 46 genes: APC, ATM, AXIN2, BARD1, BMPR1A, BRCA1, BRCA2, BRIP1, CDH1, CDKN2A (p14ARF), CDKN2A (p16INK4a), CHEK2, CTNNA1, DICER1, EPCAM (Deletion/duplication testing only), GREM1 (promoter region deletion/duplication testing only), KIT, MEN1, MLH1, MSH2, MSH3, MSH6, MUTYH, NBN, NF1, NHTL1, PALB2, PDGFRA, PMS2, POLD1, POLE, PTEN, RAD50, RAD51C, RAD51D, SDHB, SDHC, SDHD, SMAD4, SMARCA4. STK11, TP53, TSC1, TSC2, and VHL.  The following gene was evaluated for sequence changes only: SDHA and HOXB13 c.251G>A variant only.  The report date is Aug 09, 2016.  6. 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 History of pulmonary amyloidosis -She was found to have a lung nodule in October 2016, which was surgically removed, and showed pulmonary amyloidosis -Previous lab showed no evidence of systemic amyloidosis -Continue monitoring  8.  Chemo induced peripheral Neuropathy, G2 -Overall stable, and mild -I previously encouraged her to take vitamin B12 or B6. -She is taking 600 mg Neurontin during the day. I recommended her to try taking it at night an during the day if it does not make her drowsy -I also discussed if the Neurontin is not enough, we can add Cymbalta.  -I prescribed 15 tablets of Tramadol for her use for severe pain occasionally.    PLAN:  -Lab reviewed, she has recovered well, except peripheral neuropathy -Complete radiation on 06/11/08 -Mammogram in 1 month -[facility] in 2 months -Start Anastrozole in a few weeks  -F/u in 5 months with lab  -F/u with Dr. Dalbert Batman on 4/11 to schedule port removal  -Prescribed Tramadol today for her neuropathy related pain   All questions were answered. The patient knows to call the clinic with any problems, questions  or concerns. I spent 30 minutes counseling the patient face to face. The total time spent in the appointment was 40 minutes and more than 50% was on counseling.  This document serves as a record of services personally performed by Truitt Merle, MD. It was created on her behalf by Theresia Bough, a trained medical scribe. The creation of this record is based on the scribe's personal observations and the provider's statements to them.   I have reviewed the above documentation for accuracy and completeness, and I agree with the above.     Truitt Merle, MD 06/09/17

## 2017-06-09 ENCOUNTER — Encounter: Payer: Self-pay | Admitting: Hematology

## 2017-06-09 ENCOUNTER — Inpatient Hospital Stay: Payer: BC Managed Care – PPO | Attending: Hematology | Admitting: Hematology

## 2017-06-09 ENCOUNTER — Ambulatory Visit
Admission: RE | Admit: 2017-06-09 | Discharge: 2017-06-09 | Disposition: A | Discharge status: Home / Self Care | Payer: BC Managed Care – PPO | Source: Ambulatory Visit | Attending: Radiation Oncology | Admitting: Radiation Oncology

## 2017-06-09 ENCOUNTER — Other Ambulatory Visit: Payer: Self-pay | Admitting: Radiation Oncology

## 2017-06-09 VITALS — BP 133/70 | HR 71 | Temp 97.7°F | Resp 18 | Ht 63.0 in | Wt 173.4 lb

## 2017-06-09 DIAGNOSIS — E854 Organ-limited amyloidosis: Secondary | ICD-10-CM | POA: Diagnosis not present

## 2017-06-09 DIAGNOSIS — Z170 Estrogen receptor positive status [ER+]: Secondary | ICD-10-CM | POA: Insufficient documentation

## 2017-06-09 DIAGNOSIS — E1140 Type 2 diabetes mellitus with diabetic neuropathy, unspecified: Secondary | ICD-10-CM | POA: Diagnosis not present

## 2017-06-09 DIAGNOSIS — D6481 Anemia due to antineoplastic chemotherapy: Secondary | ICD-10-CM | POA: Insufficient documentation

## 2017-06-09 DIAGNOSIS — D709 Neutropenia, unspecified: Secondary | ICD-10-CM | POA: Diagnosis not present

## 2017-06-09 DIAGNOSIS — C773 Secondary and unspecified malignant neoplasm of axilla and upper limb lymph nodes: Secondary | ICD-10-CM

## 2017-06-09 DIAGNOSIS — C801 Malignant (primary) neoplasm, unspecified: Secondary | ICD-10-CM

## 2017-06-09 DIAGNOSIS — C50212 Malignant neoplasm of upper-inner quadrant of left female breast: Secondary | ICD-10-CM | POA: Diagnosis not present

## 2017-06-09 DIAGNOSIS — J99 Respiratory disorders in diseases classified elsewhere: Secondary | ICD-10-CM | POA: Insufficient documentation

## 2017-06-09 DIAGNOSIS — F17210 Nicotine dependence, cigarettes, uncomplicated: Secondary | ICD-10-CM

## 2017-06-09 DIAGNOSIS — M8580 Other specified disorders of bone density and structure, unspecified site: Secondary | ICD-10-CM | POA: Diagnosis not present

## 2017-06-09 MED ORDER — TRAMADOL HCL 50 MG PO TABS: 50.0000 mg | ORAL_TABLET | Freq: Two times a day (BID) | ORAL | 0 refills | Status: DC | PRN

## 2017-06-09 MED ORDER — ANASTROZOLE 1 MG PO TABS: 1.0000 mg | ORAL_TABLET | Freq: Every day | ORAL | 3 refills | Status: DC

## 2017-06-09 MED ORDER — IBUPROFEN 600 MG PO TABS: 600.0000 mg | ORAL_TABLET | Freq: Three times a day (TID) | ORAL | 0 refills | Status: DC | PRN

## 2017-06-10 ENCOUNTER — Ambulatory Visit: Payer: BC Managed Care – PPO | Admitting: Hematology

## 2017-06-10 ENCOUNTER — Ambulatory Visit
Admission: RE | Admit: 2017-06-10 | Discharge: 2017-06-10 | Disposition: A | Discharge status: Home / Self Care | Payer: BC Managed Care – PPO | Source: Ambulatory Visit | Attending: Radiation Oncology | Admitting: Radiation Oncology

## 2017-06-10 DIAGNOSIS — C50212 Malignant neoplasm of upper-inner quadrant of left female breast: Secondary | ICD-10-CM | POA: Diagnosis not present

## 2017-06-11 ENCOUNTER — Encounter: Payer: Self-pay | Admitting: *Deleted

## 2017-06-11 ENCOUNTER — Encounter: Payer: Self-pay | Admitting: Radiation Oncology

## 2017-06-11 ENCOUNTER — Ambulatory Visit
Admission: RE | Admit: 2017-06-11 | Discharge: 2017-06-11 | Disposition: A | Discharge status: Home / Self Care | Payer: BC Managed Care – PPO | Source: Ambulatory Visit | Attending: Radiation Oncology | Admitting: Radiation Oncology

## 2017-06-11 ENCOUNTER — Telehealth: Payer: Self-pay | Admitting: Hematology

## 2017-06-11 DIAGNOSIS — C50212 Malignant neoplasm of upper-inner quadrant of left female breast: Secondary | ICD-10-CM

## 2017-06-11 DIAGNOSIS — Z170 Estrogen receptor positive status [ER+]: Principal | ICD-10-CM

## 2017-06-11 MED ORDER — SILVER SULFADIAZINE 1 % EX CREA
TOPICAL_CREAM | Freq: Two times a day (BID) | CUTANEOUS | Status: DC
  Administered 2017-06-11: 10:00:00 via TOPICAL

## 2017-06-11 MED ORDER — RADIAPLEXRX EX GEL
Freq: Once | CUTANEOUS | Status: AC
  Administered 2017-06-11: 10:00:00 via TOPICAL

## 2017-06-11 NOTE — Progress Notes (Signed)
  Radiation Oncology         [phone] ________________________________  Name: Taylor Walsh MRN: [id number]  Date: 06/11/2017  DOB: 29-Dec-1955  End of Treatment Note  Diagnosis:   62 y.o. 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 Left Chest Wall / 50.4 Gy in 28 fractions 2. Left SCV and PAB / 50.4 Gy in 28 fractions  3. Left Chest Wall Boost / 10 Gy in 5 fractions  Beams/energy:   1. 3D / 6X, 10X Photon 2. 3D / 15X, 6X Photon 3. 3D / 9 MeV electrons  Narrative: The patient tolerated radiation treatment relatively well.   The skin over her left chest wall was hyperpigmented. Towards the end of treatment she developed dry desquamation in the left axilla and moist desquamation in the left IM fold. She is applying Neosporin to these areas and Radiaplex to her chest wall. She reported pain, relieved with ibuprofen. She was given Silvadene to apply her chest wall and was advised to continue using Neosporin to her axilla.  Plan: The patient has completed radiation treatment. The patient will return to radiation oncology clinic for routine followup in one week. I advised them to call or return sooner if they have any questions or concerns related to their recovery or treatment.  -----------------------------------  Eppie Gibson, MD  This document serves as a record of services personally performed by Eppie Gibson, MD. It was created on her behalf by Rae Lips, a trained medical scribe. The creation of this record is based on the scribe's personal observations and the provider's statements to them. This document has been checked and approved by the attending provider.

## 2017-06-11 NOTE — Progress Notes (Signed)
[place] Work  Clinical Social Work was referred by Futures trader for assessment of psychosocial needs; specifically questions regarding social security.  Clinical Social Workermet with patient in [place] office to offer support and assess for needs.  CSW and patient explored options including Social security retirement and disability.  CSW provided information or "next steps" for patient to pursue including contacting social security office .  Patient agrees to follow up with CSW as needed.    Maryjean Morn, MSW, LCSW, OSW-C Clinical Social Worker [facility] [phone]

## 2017-06-11 NOTE — Telephone Encounter (Signed)
Mailed patient calendar of upcoming may and august appointments.

## 2017-06-16 ENCOUNTER — Telehealth: Payer: Self-pay

## 2017-06-16 NOTE — Telephone Encounter (Signed)
I received a phone call from Ms. Votaw. She reports continued and increased drainage to the area underneath her left breast in which she has recently completed radiation treatment. She has continued to apply silvadene to this area up to three times daily. She is covering it with gauze and mesh when necessary and also leaving it open to air when she can. Dr. Isidore Moos was informed of the above and asked to have the patient come in this Friday for evaluation. Ms. Lehmkuhl was called with the new appointment and agreed to the time and date.

## 2017-06-17 ENCOUNTER — Encounter: Payer: Self-pay | Admitting: Radiation Oncology

## 2017-06-18 ENCOUNTER — Ambulatory Visit
Admission: RE | Admit: 2017-06-18 | Discharge: 2017-06-18 | Disposition: A | Discharge status: Home / Self Care | Payer: BC Managed Care – PPO | Source: Ambulatory Visit | Attending: Radiation Oncology | Admitting: Radiation Oncology

## 2017-06-18 ENCOUNTER — Encounter: Payer: Self-pay | Admitting: Radiation Oncology

## 2017-06-18 VITALS — BP 126/71 | HR 85 | Temp 98.7°F | Wt 171.8 lb

## 2017-06-18 DIAGNOSIS — Z885 Allergy status to narcotic agent status: Secondary | ICD-10-CM | POA: Insufficient documentation

## 2017-06-18 DIAGNOSIS — Z170 Estrogen receptor positive status [ER+]: Secondary | ICD-10-CM | POA: Insufficient documentation

## 2017-06-18 DIAGNOSIS — C50212 Malignant neoplasm of upper-inner quadrant of left female breast: Secondary | ICD-10-CM | POA: Insufficient documentation

## 2017-06-18 DIAGNOSIS — Z923 Personal history of irradiation: Secondary | ICD-10-CM | POA: Insufficient documentation

## 2017-06-18 DIAGNOSIS — Z7982 Long term (current) use of aspirin: Secondary | ICD-10-CM | POA: Diagnosis not present

## 2017-06-18 DIAGNOSIS — Z79899 Other long term (current) drug therapy: Secondary | ICD-10-CM | POA: Diagnosis not present

## 2017-06-18 DIAGNOSIS — Z7984 Long term (current) use of oral hypoglycemic drugs: Secondary | ICD-10-CM | POA: Insufficient documentation

## 2017-06-18 DIAGNOSIS — Z79890 Hormone replacement therapy (postmenopausal): Secondary | ICD-10-CM | POA: Insufficient documentation

## 2017-06-18 HISTORY — DX: Personal history of irradiation: Z92.3

## 2017-06-18 NOTE — Progress Notes (Signed)
Ms. Beyersdorf presents for follow up of radiation completed 06/11/17 to her Left Chest. She has persistent yellow drainage from underneath her Left Breast. She is using silvadene 3-4 times daily and applying non-adhesive dressings to the area. She denies pain at this time, but reports at times it can be as high as an 8/10. She is letting this area remain open to air as much as possible.   BP 126/71   Pulse 85   Temp 98.7 F (37.1 C)   Wt 171 lb 12.8 oz (77.9 kg)   BMI 30.43 kg/m    Wt Readings from Last 3 Encounters:  06/18/17 171 lb 12.8 oz (77.9 kg)  06/09/17 173 lb 6.4 oz (78.7 kg)  05/10/17 170 lb 12.8 oz (77.5 kg)

## 2017-06-18 NOTE — Progress Notes (Signed)
Radiation Oncology         [phone] ________________________________  Name: Taylor Walsh MRN: [id number]  Date: 06/18/2017  DOB: 1955-09-10  Follow-Up Visit Note  Outpatient  CC: Marton Redwood, MD  Fanny Skates, MD  Diagnosis and Prior Radiotherapy:    ICD-10-CM   1. 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 Left Chest Wall / 50.4 Gy in 28 fractions 2. Left SCV / 50.4 Gy in 28 fractions  3. Left Chest Wall Boost / 10 Gy in 5 fractions  CHIEF COMPLAINT: Here for follow-up and surveillance of left breast cancer  Narrative:  The patient returns today for routine follow-up of radiation completed 1 week ago to her left chest.  She complains of increased yellow drainage to the area underneath her left chest. She has continued to apply Silvadene to this area 3-4 times daily. She is covering it with gauze and mesh when necessary and also leaving it open to air as much as possible. She denies pain at this time but reports at times it can be as high as an 8/10. She denies any fevers or chills.                            ALLERGIES:  is allergic to vicodin [hydrocodone-acetaminophen].  Meds: Current Outpatient Medications  Medication Sig Dispense Refill  . anastrozole (ARIMIDEX) 1 MG tablet Take 1 tablet (1 mg total) by mouth daily. 30 tablet 3  . aspirin 81 MG tablet Take 81 mg by mouth daily.    Marland Kitchen atorvastatin (LIPITOR) 40 MG tablet Take 40 mg by mouth daily.    . calcium-vitamin D 250-100 MG-UNIT tablet Take 2 tablets by mouth daily.     . cephALEXin (KEFLEX) 500 MG capsule Take 1 capsule (500 mg total) by mouth 4 (four) times daily. 28 capsule 0  . ergocalciferol (VITAMIN D2) 50000 UNITS capsule  Take 50,000 Units by mouth every Friday.     . gabapentin (NEURONTIN) 300 MG capsule 300 mg PO QHS 30 capsule 2  . HYDROcodone-acetaminophen (NORCO) 5-325 MG tablet Take 1-2 tablets by mouth every 6 (six) hours as needed for moderate pain or severe pain. 20 tablet 0  . ibuprofen (ADVIL,MOTRIN) 600 MG tablet Take 1 tablet (600 mg total) by mouth every 8 (eight) hours as needed. Take with food to protect stomach. 36 tablet 0  . levothyroxine (SYNTHROID, LEVOTHROID) 88 MCG tablet Take 88 mcg by mouth daily before breakfast.     . lidocaine-prilocaine (EMLA) cream Apply to affected area once 30 g 3  . magic mouthwash w/lidocaine SOLN Take 5 mLs by mouth 3 (three) times daily as needed for mouth pain. (Patient not taking: Reported on 04/20/2017) 240 mL 1  . metFORMIN (GLUCOPHAGE) 500 MG tablet Take 1 tablet (500 mg total) 2 (two) times daily with a meal by mouth. 60 tablet 2  . methocarbamol (ROBAXIN) 500 MG tablet Take 1 tablet (500 mg total) by mouth every 8 (eight) hours as needed for muscle spasms. (Patient not taking: Reported on 03/03/2017) 30 tablet 0  . mupirocin ointment (BACTROBAN) 2 % Apply under nails with cotton swab TID 30 g 1  . olmesartan (  BENICAR) 20 MG tablet Take 20 mg by mouth daily.    . ondansetron (ZOFRAN) 8 MG tablet Take 1 tablet (8 mg total) by mouth 2 (two) times daily as needed. Start on the third day after chemotherapy. (Patient not taking: Reported on 04/09/2017) 30 tablet 1  . prochlorperazine (COMPAZINE) 10 MG tablet Take 1 tablet (10 mg total) by mouth every 6 (six) hours as needed (Nausea or vomiting). (Patient not taking: Reported on 02/17/2017) 30 tablet 1  . traMADol (ULTRAM) 50 MG tablet Take 1 tablet (50 mg total) by mouth every 12 (twelve) hours as needed for moderate pain or severe pain. 15 tablet 0   No current facility-administered medications for this encounter.     Physical Findings: The patient is in no acute distress. Patient is alert and oriented.  weight  is 171 lb 12.8 oz (77.9 kg). Her temperature is 98.7 F (37.1 C). Her blood pressure is 126/71 and her pulse is 85.   She has diffuse moist desquamation along the IM fold. It does appear that the skin is regenerating. No foul odor from this area.    Lab Findings: Lab Results  Component Value Date   WBC 4.0 05/10/2017   HGB 12.5 05/10/2017   HCT 39.4 05/10/2017   MCV 90.2 05/10/2017   PLT 198 05/10/2017    Radiographic Findings: No results found.  Impression/Plan: Healing from radiotherapy. She has diffuse moist desquamation along the IM fold. She was given gentian violet to apply to this area and was instructed by nursing how to use it: twice per day and after bathing until the skin shows satisfactory healing. She knows to return if this area does not show any healing in 1 week. Otherwise I will see her in 3 wks.  I spent 5 minutes face to face with the patient and more than 50% of that time was spent in counseling and/or coordination of care. _____________________________________   Eppie Gibson, MD  This document serves as a record of services personally performed by Eppie Gibson, MD. It was created on her behalf by Rae Lips, a trained medical scribe. The creation of this record is based on the scribe's personal observations and the provider's statements to them. This document has been checked and approved by the attending provider.

## 2017-06-21 ENCOUNTER — Encounter: Payer: Self-pay | Admitting: *Deleted

## 2017-06-28 ENCOUNTER — Telehealth: Payer: Self-pay | Admitting: *Deleted

## 2017-06-28 ENCOUNTER — Other Ambulatory Visit: Payer: Self-pay | Admitting: Hematology

## 2017-06-28 MED ORDER — DULOXETINE HCL 30 MG PO CPEP: 30.0000 mg | ORAL_CAPSULE | Freq: Every day | ORAL | 0 refills | Status: DC

## 2017-06-28 NOTE — Telephone Encounter (Signed)
Pt called requesting a call back from nurse.  Spoke with pt and was informed that Neurontin is not helping.   Stated she takes  Neurontin 600 mg  BID without relief.  Pt was instructed by md to call back if Neurontin not helping neuropathy symptoms as discussed at last office visit.   Pt would like something else.   Pt uses   Product/process development scientist on [address]. Pt's     Phone        [phone].

## 2017-06-28 NOTE — Telephone Encounter (Signed)
She can switch neurontin to Cymbalta 30 mg, start at 1 tablet daily for 1 week, if tolerated well, increase to 2 tablets daily.  I called into her pharmacy today.  Truitt Merle MD

## 2017-06-29 NOTE — Telephone Encounter (Signed)
Called pt and left message on voice mail informing pt of Dr. Ernestina Penna instructions on starting Cymbalta.  Asked pt to call nurse back to confirm that she received message.

## 2017-06-30 NOTE — Progress Notes (Signed)
ICD-10-CM   1. Malignant neoplasm of upper-inner quadrant of left breast in female, estrogen receptor positive ([place]) C50.212    Z17.0     Outpatient Simulation note The patient was brought to the treatment room for simulation for the patient's upcoming electron treatment. The patient was setup in the treatment position and the target region was delineated. The patient will receive treatment to the Left chest wall using an en face electron field. One customized block/complex treatment device has been constructed for this purpose, and this will be used on a daily basis during the patient's treatment. After appropriate set up was confirmed, skin markings were placed to allow accurate targeting of the treatment area during the patient's course of therapy.  -----------------------------------  Eppie Gibson, MD

## 2017-07-07 ENCOUNTER — Other Ambulatory Visit: Payer: Self-pay | Admitting: Hematology

## 2017-07-07 ENCOUNTER — Ambulatory Visit
Admission: RE | Admit: 2017-07-07 | Discharge: 2017-07-07 | Disposition: A | Discharge status: Home / Self Care | Payer: BC Managed Care – PPO | Source: Ambulatory Visit | Attending: Hematology | Admitting: Hematology

## 2017-07-07 DIAGNOSIS — Z1231 Encounter for screening mammogram for malignant neoplasm of breast: Secondary | ICD-10-CM

## 2017-07-07 DIAGNOSIS — Z170 Estrogen receptor positive status [ER+]: Principal | ICD-10-CM

## 2017-07-07 DIAGNOSIS — C50212 Malignant neoplasm of upper-inner quadrant of left female breast: Secondary | ICD-10-CM

## 2017-07-13 ENCOUNTER — Telehealth: Payer: Self-pay | Admitting: *Deleted

## 2017-07-13 NOTE — Telephone Encounter (Signed)
Lacie, could you give her a call tomorrow, will change anastrozole to letrozole or exemestane, hot flush will unlikley improve, but hope other side effects will be less or resolve. Thanks much.   Truitt Merle MD

## 2017-07-13 NOTE — Telephone Encounter (Signed)
Pt called requesting a call back from nurse.  Spoke with pt and was informed that pt has been taking Anastrozole since 06/25/17.   Stated she notices  some wheezing ( pt had wheezing before starting med ), has diarrhea alternating with constipation, has more sweating at night, has dry cough.  Denied pain.   Pt wanted to know if Dr. Burr Medico would consider changing to another med with less side effects.  Informed pt that Dr. Burr Medico will be notified.  Nurse will contact pt in the am with further instructions from md.  Pt voiced understanding.

## 2017-07-14 ENCOUNTER — Telehealth: Payer: Self-pay | Admitting: Nurse Practitioner

## 2017-07-14 NOTE — Telephone Encounter (Signed)
Called patient to discuss changing AI therapy per Dr. Burr Medico recommendations. No answer. Left VM to call back.

## 2017-07-14 NOTE — Telephone Encounter (Signed)
Could not reach her by phone, left VM to call back and I'll discuss further.  Thanks, Regan Rakers

## 2017-07-16 ENCOUNTER — Other Ambulatory Visit: Payer: Self-pay | Admitting: Nurse Practitioner

## 2017-07-16 ENCOUNTER — Telehealth: Payer: Self-pay | Admitting: Nurse Practitioner

## 2017-07-16 DIAGNOSIS — Z170 Estrogen receptor positive status [ER+]: Principal | ICD-10-CM

## 2017-07-16 DIAGNOSIS — C50212 Malignant neoplasm of upper-inner quadrant of left female breast: Secondary | ICD-10-CM

## 2017-07-16 MED ORDER — LETROZOLE 2.5 MG PO TABS
2.5000 mg | ORAL_TABLET | Freq: Every day | ORAL | 3 refills | Status: DC
Start: 2017-07-16 — End: 2017-11-10

## 2017-07-16 NOTE — Telephone Encounter (Signed)
I was able to reach patient to discuss medication management. I discussed side effect profile and she prefers letrozole. I called in new prescription. She will see survivorship NP in 1 month. I encouraged her to call if symptoms worsen or she has new concerns. She verbalizes understand and appreciates the call.

## 2017-07-23 ENCOUNTER — Ambulatory Visit
Admission: RE | Admit: 2017-07-23 | Discharge: 2017-07-23 | Disposition: A | Discharge status: Home / Self Care | Payer: BC Managed Care – PPO | Source: Ambulatory Visit | Attending: Radiation Oncology | Admitting: Radiation Oncology

## 2017-07-23 ENCOUNTER — Other Ambulatory Visit: Payer: Self-pay

## 2017-07-23 ENCOUNTER — Encounter: Payer: Self-pay | Admitting: Radiation Oncology

## 2017-07-23 VITALS — BP 145/75 | HR 78 | Temp 98.4°F | Resp 18 | Ht 63.0 in | Wt 170.4 lb

## 2017-07-23 DIAGNOSIS — C50212 Malignant neoplasm of upper-inner quadrant of left female breast: Secondary | ICD-10-CM

## 2017-07-23 DIAGNOSIS — Z170 Estrogen receptor positive status [ER+]: Principal | ICD-10-CM

## 2017-07-23 NOTE — Progress Notes (Signed)
Radiation Oncology         [phone] ________________________________  Name: Taylor Walsh MRN: [id number]  Date: 07/23/2017  DOB: February 03, 1956  Follow-Up Visit Note  Outpatient  CC: Marton Redwood, MD  Fanny Skates, MD  Diagnosis and Prior Radiotherapy:    ICD-10-CM   1. 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 Left Chest Wall / 50.4 Gy in 28 fractions 2. Left SCV / 50.4 Gy in 28 fractions  3. Left Chest Wall Boost / 10 Gy in 5 fractions   CHIEF COMPLAINT: Here for follow-up and surveillance of left breast cancer  Narrative: Ms. Kindall presents for follow up after radiation completed 06/11/17 to her Left Chest. She denies pain. She has fatigue. Her radiation site has improved. She has hyperpigmentation to her Left Chest. She has scarring underneath her Left reconstructed Breast. She has redness to her Left axilla. She tells me that this area in moist at times. She has completed radiaplex and will begin using vitamin E to her radiation site. She is not using silvadene or gentian violet at this time  ALLERGIES:  is allergic to vicodin [hydrocodone-acetaminophen].  Meds: Current Outpatient Medications  Medication Sig Dispense Refill  . aspirin 81 MG tablet Take 81 mg by mouth daily.    Marland Kitchen atorvastatin (LIPITOR) 40 MG tablet Take 40 mg by mouth daily.    . calcium-vitamin D 250-100 MG-UNIT tablet Take 2 tablets by mouth daily.     . DULoxetine (CYMBALTA) 30 MG capsule Take 1 capsule (30 mg total) by mouth daily. 60 capsule 0  . ergocalciferol (VITAMIN D2) 50000 UNITS capsule Take 50,000 Units by mouth every Friday.     . gabapentin (NEURONTIN) 300 MG capsule 300 mg PO QHS 30 capsule 2  .  HYDROcodone-acetaminophen (NORCO) 5-325 MG tablet Take 1-2 tablets by mouth every 6 (six) hours as needed for moderate pain or severe pain. 20 tablet 0  . ibuprofen (ADVIL,MOTRIN) 600 MG tablet Take 1 tablet (600 mg total) by mouth every 8 (eight) hours as needed. Take with food to protect stomach. 36 tablet 0  . letrozole (FEMARA) 2.5 MG tablet Take 1 tablet (2.5 mg total) by mouth daily. 30 tablet 3  . levothyroxine (SYNTHROID, LEVOTHROID) 88 MCG tablet Take 88 mcg by mouth daily before breakfast.     . lidocaine-prilocaine (EMLA) cream Apply to affected area once 30 g 3  . metFORMIN (GLUCOPHAGE) 500 MG tablet Take 1 tablet (500 mg total) 2 (two) times daily with a meal by mouth. 60 tablet 2  . mupirocin ointment (BACTROBAN) 2 % Apply under nails with cotton swab TID 30 g 1  . olmesartan (BENICAR) 20 MG tablet Take 20 mg by mouth daily.    . traMADol (ULTRAM) 50 MG tablet Take 1 tablet (50 mg total) by mouth every 12 (twelve) hours as needed for moderate pain or severe pain. 15 tablet 0  . cephALEXin (KEFLEX) 500 MG capsule Take 1 capsule (500 mg total) by mouth 4 (four) times daily. (Patient not taking: Reported on 07/23/2017) 28 capsule 0  . magic mouthwash w/lidocaine SOLN Take 5 mLs by mouth 3 (three) times daily as needed for mouth pain. (Patient not taking: Reported on 04/20/2017) 240 mL 1  . methocarbamol (ROBAXIN)  500 MG tablet Take 1 tablet (500 mg total) by mouth every 8 (eight) hours as needed for muscle spasms. (Patient not taking: Reported on 03/03/2017) 30 tablet 0  . ondansetron (ZOFRAN) 8 MG tablet Take 1 tablet (8 mg total) by mouth 2 (two) times daily as needed. Start on the third day after chemotherapy. (Patient not taking: Reported on 04/09/2017) 30 tablet 1  . prochlorperazine (COMPAZINE) 10 MG tablet Take 1 tablet (10 mg total) by mouth every 6 (six) hours as needed (Nausea or vomiting). (Patient not taking: Reported on 02/17/2017) 30 tablet 1   No current facility-administered  medications for this encounter.     Physical Findings: The patient is in no acute distress. Patient is alert and oriented.  height is '5\' 3"'  (1.6 m) and weight is 170 lb 6.4 oz (77.3 kg). Her temperature is 98.4 F (36.9 C). Her blood pressure is 145/75 (abnormal) and her pulse is 78. Her respiration is 18 and oxygen saturation is 100%. .    Skin has healed significantly since March appointment, over left reconstructive breast. Very superficial dry peeling in one cm area in left IM fold. No scabbing. She had residual hyperpigmention throughout the breast except some marked hypopigmention notable at the IM fold. She has some erythema throughout the left axilla.  Lab Findings: Lab Results  Component Value Date   WBC 4.0 05/10/2017   HGB 12.5 05/10/2017   HCT 39.4 05/10/2017   MCV 90.2 05/10/2017   PLT 198 05/10/2017    Radiographic Findings: Mm 3d Screen Breast Uni Right  Result Date: 07/07/2017 CLINICAL DATA:  Screening. EXAM: DIGITAL SCREENING UNILATERAL RIGHT MAMMOGRAM WITH CAD AND TOMO COMPARISON:  Previous exam(s). ACR Breast Density Category b: There are scattered areas of fibroglandular density. FINDINGS: The patient has had a left mastectomy. There are no findings suspicious for malignancy. Images were processed with CAD. IMPRESSION: No mammographic evidence of malignancy. A result letter of this screening mammogram will be mailed directly to the patient. RECOMMENDATION: Screening mammogram in one year.  (Code:[id number]-63M) BI-RADS CATEGORY  1: Negative. Electronically Signed   By: Fidela Salisbury M.D.   On: 07/07/2017 16:49    Impression/Plan: Ms. Trevizo  has healed significantly and is doing well overall. I advised her to use neosporin for under left reconstructed breast (IM fold) to help with skin healing for the next two weeks, and vitamin E lotion over upper back and side of torso and left CW/low neck to help with further heailing, for at least 2 months. She will continue her  physical exams in medical oncology and will return to see me as needed. Continue mammograms as indicated to contralateral breast tissue, annually   I will see her PRN, she is pleased with this plan _____________________________________   Eppie Gibson, MD

## 2017-07-23 NOTE — Progress Notes (Signed)
Taylor Walsh presents for follow up after radiation completed 06/11/17 to her Left Chest. She denies pain. She has fatigue. Her radiation site has improved. She has hyperpigmentation to her Left Chest. She has scarring underneath her Left Breast. She has redness to her Left axilla. She tells me that this area in moist at times. She has completed radiaplex and will begin using vitamin E to her radiation site. She is not using silvadene or gentian violet at this time.   BP (!) 145/75   Pulse 78   Temp 98.4 F (36.9 C)   Resp 18   Ht 5\' 3"  (1.6 m)   Wt 170 lb 6.4 oz (77.3 kg)   SpO2 100% Comment: room air  BMI 30.19 kg/m    Wt Readings from Last 3 Encounters:  07/23/17 170 lb 6.4 oz (77.3 kg)  06/18/17 171 lb 12.8 oz (77.9 kg)  06/09/17 173 lb 6.4 oz (78.7 kg)

## 2017-07-26 ENCOUNTER — Encounter: Payer: Self-pay | Admitting: Radiation Oncology

## 2017-08-12 ENCOUNTER — Inpatient Hospital Stay: Payer: BC Managed Care – PPO | Attending: Hematology | Admitting: Adult Health

## 2017-08-12 ENCOUNTER — Encounter: Payer: Self-pay | Admitting: Adult Health

## 2017-08-12 VITALS — BP 110/62 | HR 64 | Temp 97.7°F | Resp 18 | Ht 63.0 in | Wt 171.0 lb

## 2017-08-12 DIAGNOSIS — C773 Secondary and unspecified malignant neoplasm of axilla and upper limb lymph nodes: Secondary | ICD-10-CM | POA: Diagnosis not present

## 2017-08-12 DIAGNOSIS — Z170 Estrogen receptor positive status [ER+]: Secondary | ICD-10-CM | POA: Insufficient documentation

## 2017-08-12 DIAGNOSIS — C50212 Malignant neoplasm of upper-inner quadrant of left female breast: Secondary | ICD-10-CM | POA: Diagnosis not present

## 2017-08-12 DIAGNOSIS — Z79811 Long term (current) use of aromatase inhibitors: Secondary | ICD-10-CM | POA: Insufficient documentation

## 2017-08-12 NOTE — Progress Notes (Addendum)
CLINIC:  Survivorship   REASON FOR VISIT:  Routine follow-up post-treatment for a recent history of breast cancer.  BRIEF ONCOLOGIC HISTORY:  Oncology History   Cancer Staging Breast cancer of upper-inner quadrant of left female breast [facility]) Staging form: Breast, AJCC 8th Edition - Clinical stage from 07/10/2016: Stage 0 (cTis (DCIS), cN0, cM0, G3, ER: Positive, PR: Positive, HER2: Not Assessed) - Signed by Truitt Merle, MD on 07/28/2016 - Pathologic stage from 10/26/2016: Stage IA (pT1a, pN1a, cM0, G2, ER: Positive, PR: Positive, HER2: Negative) - Signed by Truitt Merle, MD on 11/11/2016       Breast cancer of upper-inner quadrant of left female breast ([place])   07/10/2016 Initial Biopsy    Diagnosis 1. 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 2. 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      07/10/2016 Receptors her2    ER 95%, PR 80-90%+, both strong staining, Ki67 80-90%      07/22/2016 Initial Diagnosis    Ductal carcinoma in situ (DCIS) of left breast      07/27/2016 Imaging    Breast MRI w wo contrast showed  1. Extensive mass and non mass enhancement along the lower inner aspect the left breast, lower outer quadrant, spanning 10.2 cm from anterior to posterior, including both biopsied lesions, as detailed above. This is all consistent with DCIS. 2. No other abnormal enhancement in the left breast. 3. No abnormal or enlarged axillary lymph nodes. 4. No evidence of malignancy in the right breast      08/10/2016 Genetic Testing    ATM [id number] VUS identified on the Common Hereditary Cancer panel. The Hereditary Gene Panel offered by [facility] includes sequencing and/or deletion duplication testing of the following 46 genes: APC, ATM, AXIN2, BARD1, BMPR1A, BRCA1,  BRCA2, BRIP1, CDH1, CDKN2A (p14ARF), CDKN2A (p16INK4a), CHEK2, CTNNA1, DICER1, EPCAM (Deletion/duplication testing only), GREM1 (promoter region deletion/duplication testing only), KIT, MEN1, MLH1, MSH2, MSH3, MSH6, MUTYH, NBN, NF1, NHTL1, PALB2, PDGFRA, PMS2, POLD1, POLE, PTEN, RAD50, RAD51C, RAD51D, SDHB, SDHC, SDHD, SMAD4, SMARCA4. STK11, TP53, TSC1, TSC2, and VHL.  The following gene was evaluated for sequence changes only: SDHA and HOXB13 c.251G>A variant only.  The report date is Aug 09, 2016.       10/13/2016 Surgery    LEFT TOTAL MASTECTOMY WITH LEFT AXILLARY SENTINEL LYMPH NODE BIOPSY By Dr. Ninfa Linden      10/13/2016 Pathology Results    Surgery Diagnosis 10/13/16  1. Lymph node, sentinel, biopsy, Left Axillary - METASTATIC CARCINOMA IN ONE LYMPH NODE (1/1). 2. Lymph node, sentinel, biopsy, Left - METASTATIC CARCINOMA IN ONE LYMPH NODE (1/1). 3. Breast, radical mastectomy (including lymph nodes), Left - INVASIVE DUCTAL CARCINOMA, 0.3 CM. - LYMPHOVASCULAR INVOLVEMENT BY CARCINOMA. - HIGH GRADE DUCTAL CARCINOMA IN SITU WITH CALCIFICATIONS AND NECROSIS, 5.5 CM. - DCIS FOCALLY 0.1 CM FROM CAUTERIZED MEDIAL MARGIN. - SEE ONCOLOGY TABLE AND COMMENT. 4. Skin , Left additional mastectomy flap - BENIGN FIBROADIPOSE TISSUE. - NO EVIDENCE OF MALIGNANCY.      10/30/2016 Miscellaneous    MammaPrint 10/31/26 Result:  High Risk,Luminal type B MPI: -0.368 Average 10-year risk of recurrence untreated: 29%      11/18/2016 Imaging    CT CAP W Contrast 11/18/16 IMPRESSION: 1. Skin thickening and interstitial changes involving the left breast, likely related to radiation. Surgical changes  are also noted with a tissue expander in place. Probable postop fluid collection in the upper outer quadrant breast. No enlarged supraclavicular or axillary lymph nodes. 2. No CT findings suggesting metastatic disease involving the chest, abdomen or pelvis or bony structures. 3. Surgical changes from a left  upper lobe wedge resection. No findings for recurrent tumor.      11/23/2016 Imaging    Bone Scan Whole body 11/23/16 IMPRESSION: No definite scintigraphic evidence of osseous metastatic disease as above.      11/25/2016 - 04/07/2017 Chemotherapy    Adjuvant chemotherapy AC every 2 weeks for 4 cycles, 11/25/16-01/05/17, followed by Taxol weekly for 12 cycles starting 01/20/17       04/28/2017 - 06/11/2017 Radiation Therapy     1. Left Chest Wall / 50.4 Gy in 28 fractions 2. Left SCV and PAB / 50.4 Gy in 28 fractions  3. Left Chest Wall Boost / 10 Gy in 5 fractions        INTERVAL HISTORY:  Ms. Huisman presents to the [facility] today for our initial meeting to review her survivorship care plan detailing her treatment course for breast cancer, as well as monitoring long-term side effects of that treatment, education regarding health maintenance, screening, and overall wellness and health promotion.     Overall, Ms. Dikes reports feeling well.  She was initially started on Anastrozole, however didn't tolerate that well and was switched to letrozole in 06/2017.  She did undergo bone density with Dr. Brigitte Pulse at [facility].  I cannot find these records in Epic.  Ebonique cannot recall what the results were.  She is tolerating the letrozole better than the anastrozole.  She denies vaginal dryness, isn't really having hot flashes, and denies arthralgias.  She does have some chronic right knee issues.  She still is experiencing fatigue.    Marvette's main issue is continued peripheral neuroapthy.  She tells me that her pain is so significant at night that she sometimes will cry.  She has occasional balance difficulties due to the neuropathy in her feet.  She was previously on Gabapentin, but didn't like it.  She then was on Cymbalta, but stopped this since she was introduced to a supplement by her friend.  She cant recall the name of the supplement.  She hasn't noted an improvement since  taking the supplement, so she is willing to try something else.  She does take Tramadol PRN for this.    Jailyne also continues to have nail changes that started during chemotherapy.    Shastina is up to date with her cancer screenings.  To note, she is a previous smoker.  She has a 44 pack year tobacco history and quit 08/27/2016.      REVIEW OF SYSTEMS:  Review of Systems  Constitutional: Positive for fatigue. Negative for appetite change, chills, diaphoresis, fever and unexpected weight change.  HENT:   Negative for hearing loss, lump/mass and trouble swallowing.   Eyes: Negative for eye problems and icterus.  Respiratory: Negative for chest tightness, cough and shortness of breath.   Cardiovascular: Negative for chest pain, leg swelling and palpitations.  Gastrointestinal: Negative for abdominal distention, abdominal pain, constipation, diarrhea, nausea and vomiting.  Endocrine: Negative for hot flashes.  Neurological: Positive for numbness. Negative for dizziness and extremity weakness.  Hematological: Negative for adenopathy.  Psychiatric/Behavioral: Negative for depression. The patient is not nervous/anxious.   Breast: Denies any new nodularity, masses, tenderness, nipple changes, or nipple discharge.  ONCOLOGY TREATMENT TEAM:  1. Surgeon:  Dr. Dalbert Batman at [facility] 2. Medical Oncologist: Dr. Burr Medico  3. Radiation Oncologist: Dr. Isidore Moos    PAST MEDICAL/SURGICAL HISTORY:  Past Medical History:  Diagnosis Date  . Anxiety   . Arthritis   . Cancer ([place]) 10/08/2016   left breast cancer  . Diabetes mellitus without complication ([place])   . Dyslipidemia   . Family history of breast cancer   . Family history of ovarian cancer   . Family history of prostate cancer   . GERD (gastroesophageal reflux disease)    nexium if needed  . Heart murmur   . History of radiation therapy 04/28/17- 06/11/17   Left Chest wall/ 50.4 Gy in 28 fractions. Left SCV/ 50.4 Gy in 28 fractions.  Left Chest wall boost/ 10 Gy in 5 fractions.   . Hypertension   . Hypothyroid   . Osteopenia   . Pneumonia 10/08/2016   June 2013   Past Surgical History:  Procedure Laterality Date  . ABDOMINAL HYSTERECTOMY    . BREAST RECONSTRUCTION WITH PLACEMENT OF TISSUE EXPANDER AND FLEX HD (ACELLULAR HYDRATED DERMIS) Left 10/13/2016   Procedure: LEFT BREAST RECONSTRUCTION WITH PLACEMENT OF TISSUE EXPANDER AND ALLODERM;  Surgeon: Irene Limbo, MD;  Location: [facility];  Service: Plastics;  Laterality: Left;  . FOOT SURGERY     bi-lat/ repaired hammer toes  . MASTECTOMY Left 10/13/2016  . MASTECTOMY W/ SENTINEL NODE BIOPSY Left 10/13/2016  . MASTECTOMY W/ SENTINEL NODE BIOPSY Left 10/13/2016   Procedure: LEFT TOTAL MASTECTOMY WITH LEFT AXILLARY SENTINEL LYMPH NODE BIOPSY;  Surgeon: Coralie Keens, MD;  Location: [facility];  Service: General;  Laterality: Left;  . PORTACATH PLACEMENT Right 11/24/2016   Procedure: INSERTION PORT-A-CATH;  Surgeon: Fanny Skates, MD;  Location: [facility];  Service: General;  Laterality: Right;  . TUBAL LIGATION    . VIDEO ASSISTED THORACOSCOPY (VATS)/WEDGE RESECTION Left 02/25/2015   Procedure: VIDEO ASSISTED THORACOSCOPY (VATS)/ LUL WEDGE RESECTION with ON Q placement.;  Surgeon: Melrose Nakayama, MD;  Location: [facility];  Service: Thoracic;  Laterality: Left;     ALLERGIES:  Allergies  Allergen Reactions  . Vicodin [Hydrocodone-Acetaminophen] Swelling and Other (See Comments)    SWELLING REACTION UNSPECIFIED      CURRENT MEDICATIONS:  Outpatient Encounter Medications as of 08/12/2017  Medication Sig Note  . aspirin 81 MG tablet Take 81 mg by mouth daily.   Marland Kitchen atorvastatin (LIPITOR) 40 MG tablet Take 40 mg by mouth daily.   . calcium-vitamin D 250-100 MG-UNIT tablet Take 2 tablets by mouth daily.    . ergocalciferol (VITAMIN D2) 50000 UNITS capsule Take 50,000 Units by mouth every Friday.    Marland Kitchen ibuprofen (ADVIL,MOTRIN) 600 MG tablet Take 1  tablet (600 mg total) by mouth every 8 (eight) hours as needed. Take with food to protect stomach.   Marland Kitchen letrozole (FEMARA) 2.5 MG tablet Take 1 tablet (2.5 mg total) by mouth daily.   Marland Kitchen levothyroxine (SYNTHROID, LEVOTHROID) 75 MCG tablet    . metFORMIN (GLUCOPHAGE) 500 MG tablet Take 1 tablet (500 mg total) 2 (two) times daily with a meal by mouth.   . mupirocin ointment (BACTROBAN) 2 % Apply under nails with cotton swab TID   . olmesartan (BENICAR) 20 MG tablet Take 20 mg by mouth daily.   . traMADol (ULTRAM) 50 MG tablet Take 1 tablet (50 mg total) by mouth every 12 (twelve) hours as needed for moderate pain or severe pain.   Marland Kitchen  DULoxetine (CYMBALTA) 30 MG capsule Take 1 capsule (30 mg total) by mouth daily. (Patient not taking: Reported on 08/12/2017)   . methocarbamol (ROBAXIN) 500 MG tablet Take 1 tablet (500 mg total) by mouth every 8 (eight) hours as needed for muscle spasms. (Patient not taking: Reported on 03/03/2017) 12/10/2016: As needed  . [DISCONTINUED] cephALEXin (KEFLEX) 500 MG capsule Take 1 capsule (500 mg total) by mouth 4 (four) times daily. (Patient not taking: Reported on 07/23/2017)   . [DISCONTINUED] gabapentin (NEURONTIN) 300 MG capsule 300 mg PO QHS (Patient not taking: Reported on 08/12/2017)   . [DISCONTINUED] HYDROcodone-acetaminophen (NORCO) 5-325 MG tablet Take 1-2 tablets by mouth every 6 (six) hours as needed for moderate pain or severe pain. (Patient not taking: Reported on 08/12/2017)   . [DISCONTINUED] levothyroxine (SYNTHROID, LEVOTHROID) 88 MCG tablet Take 88 mcg by mouth daily before breakfast.    . [DISCONTINUED] lidocaine-prilocaine (EMLA) cream Apply to affected area once (Patient not taking: Reported on 08/12/2017)   . [DISCONTINUED] magic mouthwash w/lidocaine SOLN Take 5 mLs by mouth 3 (three) times daily as needed for mouth pain. (Patient not taking: Reported on 04/20/2017)   . [DISCONTINUED] ondansetron (ZOFRAN) 8 MG tablet Take 1 tablet (8 mg total) by mouth 2  (two) times daily as needed. Start on the third day after chemotherapy. (Patient not taking: Reported on 04/09/2017) 12/10/2016: As needed  . [DISCONTINUED] prochlorperazine (COMPAZINE) 10 MG tablet Take 1 tablet (10 mg total) by mouth every 6 (six) hours as needed (Nausea or vomiting). (Patient not taking: Reported on 02/17/2017) 12/10/2016: As needed   No facility-administered encounter medications on file as of 08/12/2017.      ONCOLOGIC FAMILY HISTORY:  Family History  Problem Relation Age of Onset  . Cancer Mother        brain, ovarian  . Hypertension Mother   . Stroke Father   . Hypertension Father   . Cancer Sister 23       bilateral breast cancer, 2nd cancer at 2  . Cancer Brother        dx with prostate cancer in the 19s  . Cancer Brother        dx in his 40s with prostate cancer  . Brain cancer Maternal Uncle   . Cirrhosis Brother   . Cancer Brother        NOS  . Cancer Paternal Aunt        NOS  . Cancer Cousin        paternal cousin with cancer NOS     SOCIAL HISTORY:  Social History   Socioeconomic History  . Marital status: Single    Spouse name: Not on file  . Number of children: 2  . Years of education: Not on file  . Highest education level: Not on file  Occupational History  . Not on file  Social Needs  . Financial resource strain: Not on file  . Food insecurity:    Worry: Not on file    Inability: Not on file  . Transportation needs:    Medical: Not on file    Non-medical: Not on file  Tobacco Use  . Smoking status: Former Smoker    Packs/day: 1.00    Years: 42.00    Pack years: 42.00    Last attempt to quit: 08/27/2016    Years since quitting: 0.9  . Smokeless tobacco: Never Used  . Tobacco comment: 5/1/18she smokes occasionally when stressed. She is smoking 4 cigarettes a week  Substance  and Sexual Activity  . Alcohol use: No    Alcohol/week: 0.0 oz    Comment: once or twice yearly.  . Drug use: No  . Sexual activity: Not on file    Lifestyle  . Physical activity:    Days per week: Not on file    Minutes per session: Not on file  . Stress: Not on file  Relationships  . Social connections:    Talks on phone: Not on file    Gets together: Not on file    Attends religious service: Not on file    Active member of club or organization: Not on file    Attends meetings of clubs or organizations: Not on file    Relationship status: Not on file  . Intimate partner violence:    Fear of current or ex partner: Not on file    Emotionally abused: Not on file    Physically abused: Not on file    Forced sexual activity: Not on file  Other Topics Concern  . Not on file  Social History Narrative  . Not on file      PHYSICAL EXAMINATION:  Vital Signs:   Vitals:   08/12/17 0918  BP: 110/62  Pulse: 64  Resp: 18  Temp: 97.7 F (36.5 C)  SpO2: 97%   Filed Weights   08/12/17 0918  Weight: 171 lb (77.6 kg)   General: Well-nourished, well-appearing female in no acute distress.  She is unaccompanied today.   HEENT: Head is normocephalic.  Pupils equal and reactive to light. Conjunctivae clear without exudate.  Sclerae anicteric. Oral mucosa is pink, moist.  Oropharynx is pink without lesions or erythema.  Lymph: No cervical, supraclavicular, or infraclavicular lymphadenopathy noted on palpation.  Cardiovascular: Regular rate and rhythm.Marland Kitchen Respiratory: Clear to auscultation bilaterally. Chest expansion symmetric; breathing non-labored.  GI: Abdomen soft and round; non-tender, non-distended. Bowel sounds normoactive.  GU: Deferred.  Neuro: No focal deficits. Steady gait.  Psych: Mood and affect normal and appropriate for situation.  Extremities: No edema. MSK: No focal spinal tenderness to palpation.  Full range of motion in bilateral upper extremities Skin: Warm and dry.  LABORATORY DATA:  None for this visit.  DIAGNOSTIC IMAGING:  None for this visit.      ASSESSMENT AND PLAN:  Ms.. Pagnotta is a pleasant 62  y.o. female with Stage IA left breast invasive ductal carcinoma, ER+/PR+/HER2-, diagnosed in 07/2016, treated with mastectomy, adjuvant chemotherapy, adjuvant radiation therapy, and anti-estrogen therapy with Letrozole beginning in 06/2017.  She presents to the Survivorship Clinic for our initial meeting and routine follow-up post-completion of treatment for breast cancer.    1. Stage IA left breast cancer:  Ms. Fuston is continuing to recover from definitive treatment for breast cancer. She will follow-up with her medical oncologist, Dr. Burr Medico in 10/2017 with history and physical exam per surveillance protocol.  She will continue her anti-estrogen therapy with Letrozole. Thus far, she is tolerating the Letrozole well, with minimal side effects.  Today, a comprehensive survivorship care plan and treatment summary was reviewed with the patient today detailing her breast cancer diagnosis, treatment course, potential late/long-term effects of treatment, appropriate follow-up care with recommendations for the future, and patient education resources.  A copy of this summary, along with a letter will be sent to the patient's primary care provider via mail/fax/In Basket message after today's visit.    2. Peripheral neuropathy: She will restart Cymbalta at '30mg'$  per day.  She will take this daily and  in 2 weeks we will consider increasing to 55m per day.  She will continue on Tramadol PRN.  She may benefit from adding Gabapetin, and also, PT with TENS therapy.  She declines PT currently.    4. Bone health:  Given Ms. Pilley's age/history of breast cancer and her current treatment regimen including anti-estrogen therapy with Letrozole, she is at risk for bone demineralization.  [facility] cannot [facility] those results.  We got her authorization to request this test from them again.  I counseled her that regardless of the results, she will need bone density testing every 2  years while taking aromatase inhibitor therapy.  She was given education on specific activities to promote bone health.  5. Cancer screening:  Due to Ms. Camargo's history and her age, she should receive screening for skin cancers, colon cancer, lung cancer,  and gynecologic cancers.  The information and recommendations are listed on the patient's comprehensive care plan/treatment summary and were reviewed in detail with the patient.    6. Health maintenance and wellness promotion: Ms. BCarywas encouraged to consume 5-7 servings of fruits and vegetables per day. We reviewed the "Nutrition Rainbow" handout, as well as the handout "Take Control of Your Health and Reduce Your Cancer Risk" from the [employer]  She was also encouraged to engage in moderate to vigorous exercise for 30 minutes per day most days of the week. We discussed the [employer] fitness program, which is designed for cancer survivors to help them become more physically fit after cancer treatments.  She was instructed to limit her alcohol consumption and continue to abstain from tobacco use.     7. Support services/counseling: It is not uncommon for this period of the patient's cancer care trajectory to be one of many emotions and stressors.  We discussed an opportunity for her to participate in the next session of [facility]Finding Your New Normal") support group series designed for patients after they have completed treatment.   Ms. BWhitmirewas encouraged to take advantage of our many other support services programs, support groups, and/or counseling in coping with her new life as a cancer survivor after completing anti-cancer treatment.  She was offered support today through active listening and expressive supportive counseling.  She was given information regarding our available services and encouraged to contact me with any questions or for help enrolling in any of our support group/programs.    Dispo:   -Return to cancer  center in three months for f/u with Dr. FBurr Medico -Mammogram due in 06/2018 -Follow up with Dr. IDalbert Batmanin 12/2017 -Lung cancer screening 10/2018 -She is welcome to return back to the Survivorship Clinic at any time; no additional follow-up needed at this time.  -Consider referral back to survivorship as a long-term survivor for continued surveillance  A total of (50) minutes of face-to-face time was spent with this patient with greater than 50% of that time in counseling and care-coordination.   LGardenia Phlegm [phone]  Note: PRIMARY CARE PROVIDER SMarton Redwood [phone]

## 2017-09-12 ENCOUNTER — Other Ambulatory Visit: Payer: Self-pay | Admitting: Hematology

## 2017-09-14 ENCOUNTER — Other Ambulatory Visit: Payer: Self-pay | Admitting: Nurse Practitioner

## 2017-09-15 DIAGNOSIS — Z923 Personal history of irradiation: Secondary | ICD-10-CM | POA: Insufficient documentation

## 2017-09-17 ENCOUNTER — Other Ambulatory Visit: Payer: Self-pay | Admitting: Nurse Practitioner

## 2017-09-17 DIAGNOSIS — G620 Drug-induced polyneuropathy: Secondary | ICD-10-CM

## 2017-09-20 ENCOUNTER — Telehealth: Payer: Self-pay

## 2017-09-20 ENCOUNTER — Other Ambulatory Visit: Payer: Self-pay | Admitting: Hematology

## 2017-09-20 MED ORDER — DULOXETINE HCL 60 MG PO CPEP: 60.0000 mg | ORAL_CAPSULE | Freq: Every day | ORAL | 2 refills | Status: DC

## 2017-09-20 MED ORDER — TRAMADOL HCL 50 MG PO TABS: 50.0000 mg | ORAL_TABLET | Freq: Two times a day (BID) | ORAL | 0 refills | Status: DC | PRN

## 2017-09-20 NOTE — Telephone Encounter (Signed)
-----   Message from Truitt Merle, MD sent at 09/20/2017  9:16 AM EDT ----- Please let pt know that I have refilled her Cymbalta and tramadol, they have mild interaction, if she notices tremor or other new side effects, please call us, thanks   Krista Blue

## 2017-09-20 NOTE — Telephone Encounter (Signed)
Called patient left voice message per Dr. Burr Medico she has refilled your Cymbalta and Tramadol, please be aware they may have a mild interaction, instructed if she notices tremor or new side effects to let us know.

## 2017-10-07 ENCOUNTER — Ambulatory Visit (INDEPENDENT_AMBULATORY_CARE_PROVIDER_SITE_OTHER)
Admission: RE | Admit: 2017-10-07 | Discharge: 2017-10-07 | Disposition: A | Discharge status: Home / Self Care | Payer: BC Managed Care – PPO | Source: Ambulatory Visit | Attending: Pulmonary Disease | Admitting: Pulmonary Disease

## 2017-10-07 ENCOUNTER — Ambulatory Visit (INDEPENDENT_AMBULATORY_CARE_PROVIDER_SITE_OTHER): Payer: BC Managed Care – PPO | Admitting: Pulmonary Disease

## 2017-10-07 ENCOUNTER — Encounter: Payer: Self-pay | Admitting: Pulmonary Disease

## 2017-10-07 VITALS — BP 136/80 | HR 88 | Ht 63.0 in | Wt 173.0 lb

## 2017-10-07 DIAGNOSIS — R911 Solitary pulmonary nodule: Secondary | ICD-10-CM

## 2017-10-07 DIAGNOSIS — E854 Organ-limited amyloidosis: Secondary | ICD-10-CM | POA: Diagnosis not present

## 2017-10-07 DIAGNOSIS — J99 Respiratory disorders in diseases classified elsewhere: Secondary | ICD-10-CM | POA: Diagnosis not present

## 2017-10-07 NOTE — Patient Instructions (Signed)
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

## 2017-10-07 NOTE — Progress Notes (Signed)
Subjective:    Patient ID: Taylor Walsh, female    DOB: 09-Aug-1955, 62 y.o.   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  She says that she has smoked as much as a pack a day.  She quit smoking in 07/2016.  Prior to that she had smoked 1ppd for 40 years.    HPI Chief Complaint  Patient presents with  . Follow-up    needs clearance for surgery (Dr. Morley Kos at [facility])   Taylor Walsh says she feels like herself for the first time in a long time.  She tries to stay active but she is limited by her neuropathy in her hands and feet.  She says if she walks too much she will have more numbness in her hands and feet.    Her breathing has been "fine".  She quit smoking in 07/2016.  She has not restarted.     Past Medical History:  Diagnosis Date  . Anxiety   . Arthritis   . Cancer ([place]) 10/08/2016   left breast cancer  . Diabetes mellitus without complication ([place])   . Dyslipidemia   . Family history of breast cancer   . Family history of ovarian cancer   . Family history of prostate cancer   . GERD (gastroesophageal reflux disease)    nexium if needed  . Heart murmur   . History of radiation therapy 04/28/17- 06/11/17   Left Chest wall/ 50.4 Gy in 28 fractions. Left SCV/ 50.4 Gy in 28 fractions. Left Chest wall boost/ 10 Gy in 5 fractions.   . Hypertension   . Hypothyroid   . Osteopenia   . Pneumonia 10/08/2016   June 2013      Review of Systems  Constitutional: Negative for chills, fatigue and fever.  HENT: Negative for postnasal drip, sinus pressure and sneezing.   Respiratory: Negative for cough, chest tightness and wheezing.   Cardiovascular: Negative for chest pain, palpitations and leg swelling.       Objective:   Physical Exam Vitals:   10/07/17 1106  BP: 136/80  Pulse: 88  SpO2: 98%  Weight: 173 lb (78.5 kg)  Height: 5\' 3"  (1.6 m)   RA  Gen: well appearing HENT: OP clear, TM's clear, neck supple PULM: CTA B, normal  percussion CV: RRR, no mgr, trace edema GI: BS+, soft, nontender Derm: no cyanosis or rash Psyche: normal mood and affect  Labs: 02/2015 SPEP completely normal  Echo 03/28/2015 echocardiogram shows a normal LVEF with no evidence of cardiac amyloid, there is a normal RV with no evidence of pulmonary hypertension  Path 02/25/2015 left upper lobe wedge biopsy: - FIBROELASTOTIC SCAR WITH AMYLOID DEPOSITION (1.2 CM LUNG NODULE). 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 - NO ATYPIA OR MALIGNANCY IDENTIFIED.  PFT September 2016 pulmonary function testing ratio 80%, FEV1 2.21 L, 111% predicted no change with bronchodilator, FVC 2.50 L (98% predicted), total lung capacity 4.24 L (86% predicted), DLCO 18.01 (78% predicted).  Chest imaging: 10/2016 [id number] chest images reviewed      Assessment & Plan:    Nodule of left lung - Plan: DG Chest 2 View  Pulmonary amyloidosis [facility])  Discussion: This has been a stable interval for Taylor Walsh.  She has an interesting finding of pulmonary amyloidosis on a biopsy.  There has been no progression from a symptomatic standpoint.  Chest imaging has been stable.  She does not have COPD as she had normal  lung function testing when we assessed this several years ago.  She has quit smoking.  Today she asked me about her surgical risk for upcoming constructive breast surgery.  I explained to her that her risk is low and she can proceed with surgery.  Plan: Pulmonary amyloidosis: We will check a chest x-ray today to make sure there is no evidence of recurrence From a surgical risk standpoint you are low risk and can proceed with surgery  Cigarette smoker: Congratulations on quitting smoking in May 2018 When you turn 18 we will make sure you are enrolled in the lung cancer screening program from Medicare  We will see you back in 1 year or sooner if needed    Current Outpatient Medications:  .  aspirin 81 MG tablet, Take 81 mg by  mouth daily., Disp: , Rfl:  .  atorvastatin (LIPITOR) 40 MG tablet, Take 40 mg by mouth daily., Disp: , Rfl:  .  calcium-vitamin D 250-100 MG-UNIT tablet, Take 2 tablets by mouth daily. , Disp: , Rfl:  .  DULoxetine (CYMBALTA) 30 MG capsule, TAKE 1 CAPSULE BY MOUTH ONCE DAILY FOR 7 DAYS IF  TOLERATES  WELL,  INCREASE  TO  2  CAPSULES  DAILY, Disp: 60 capsule, Rfl: 0 .  ergocalciferol (VITAMIN D2) 50000 UNITS capsule, Take 50,000 Units by mouth every Friday. , Disp: , Rfl:  .  ibuprofen (ADVIL,MOTRIN) 600 MG tablet, Take 1 tablet (600 mg total) by mouth every 8 (eight) hours as needed. Take with food to protect stomach., Disp: 36 tablet, Rfl: 0 .  letrozole (FEMARA) 2.5 MG tablet, Take 1 tablet (2.5 mg total) by mouth daily., Disp: 30 tablet, Rfl: 3 .  levothyroxine (SYNTHROID, LEVOTHROID) 75 MCG tablet, , Disp: , Rfl: 6 .  losartan (COZAAR) 50 MG tablet, TAKE 1 TABLET BY MOUTH ONCE DAILY FOR 90 DAYS, Disp: , Rfl: 1 .  metFORMIN (GLUCOPHAGE) 500 MG tablet, Take 1 tablet (500 mg total) 2 (two) times daily with a meal by mouth., Disp: 60 tablet, Rfl: 2 .  traMADol (ULTRAM) 50 MG tablet, Take 1 tablet (50 mg total) by mouth every 12 (twelve) hours as needed for moderate pain or severe pain., Disp: 30 tablet, Rfl: 0 .  methocarbamol (ROBAXIN) 500 MG tablet, Take 1 tablet (500 mg total) by mouth every 8 (eight) hours as needed for muscle spasms. (Patient not taking: Reported on 03/03/2017), Disp: 30 tablet, Rfl: 0

## 2017-11-05 NOTE — Progress Notes (Signed)
[facility]  [phone] [phone]) [phone]  Clinic Follow-up Note   Patient Care Team: Marton Redwood, MD as PCP - General (Internal Medicine) Melrose Nakayama, MD as Consulting Physician (Cardiothoracic Surgery) Fanny Skates, MD as Consulting Physician (General Surgery) Truitt Merle, MD as Consulting Physician (Hematology) Delice Bison, Charlestine Massed, NP as Nurse Practitioner (Hematology and Oncology) Eppie Gibson, MD as Attending Physician (Radiation Oncology)   Date of Service:  11/10/2017   CHIEF COMPLAINTS F/U left breast cancer   Oncology History   Cancer Staging Breast cancer of upper-inner quadrant of left female breast Taylor Walsh) Staging form: Breast, AJCC 8th Edition - Clinical stage from 07/10/2016: Stage 0 (cTis (DCIS), cN0, cM0, G3, ER: Positive, PR: Positive, HER2: Not Assessed) - Signed by Truitt Merle, MD on 07/28/2016 - Pathologic stage from 10/26/2016: Stage IA (pT1a, pN1a, cM0, G2, ER: Positive, PR: Positive, HER2: Negative) - Signed by Truitt Merle, MD on 11/11/2016       Breast cancer of upper-inner quadrant of left female breast ([place])   07/10/2016 Initial Biopsy    Diagnosis 1. 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 2. 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    07/10/2016 Receptors her2    ER 95%, PR 80-90%+, both strong staining, Ki67 80-90%    07/22/2016 Initial Diagnosis    Ductal carcinoma in situ (DCIS) of left breast    07/27/2016 Imaging    Breast MRI w wo contrast showed  1. Extensive mass and non mass enhancement along the lower inner aspect the left breast, lower outer quadrant, spanning 10.2 cm from anterior to posterior, including both biopsied lesions, as detailed above. This is all consistent with DCIS.  2. No other abnormal enhancement in the left breast. 3. No abnormal or enlarged axillary lymph nodes. 4. 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 The Hereditary Gene Panel offered by [facility] includes sequencing and/or deletion duplication testing of the following 46 genes: APC, ATM, AXIN2, BARD1, BMPR1A, BRCA1, BRCA2, BRIP1, CDH1, CDKN2A (p14ARF), CDKN2A (p16INK4a), CHEK2, CTNNA1, DICER1, EPCAM (Deletion/duplication testing only), GREM1 (promoter region deletion/duplication testing only), KIT, MEN1, MLH1, MSH2, MSH3, MSH6, MUTYH, NBN, NF1, NHTL1, PALB2, PDGFRA, PMS2, POLD1, POLE, PTEN, RAD50, RAD51C, RAD51D, SDHB, SDHC, SDHD, SMAD4, SMARCA4. STK11, TP53, TSC1, TSC2, and VHL.  The following gene was evaluated for sequence changes only: SDHA and HOXB13 c.251G>A variant only.  The report date is Aug 09, 2016.     10/13/2016 Surgery    LEFT TOTAL MASTECTOMY WITH LEFT AXILLARY SENTINEL LYMPH NODE BIOPSY By Dr. Ninfa Linden    10/13/2016 Pathology Results    Surgery Diagnosis 10/13/16  1. Lymph node, sentinel, biopsy, Left Axillary - METASTATIC CARCINOMA IN ONE LYMPH NODE (1/1). 2. Lymph node, sentinel, biopsy, Left - METASTATIC CARCINOMA IN ONE LYMPH NODE (1/1). 3. Breast, radical mastectomy (including lymph nodes), Left - INVASIVE DUCTAL CARCINOMA, 0.3 CM. - LYMPHOVASCULAR INVOLVEMENT BY CARCINOMA. - HIGH GRADE DUCTAL CARCINOMA IN SITU WITH CALCIFICATIONS AND NECROSIS, 5.5 CM. - DCIS FOCALLY 0.1 CM FROM CAUTERIZED MEDIAL MARGIN. - SEE ONCOLOGY TABLE AND COMMENT. 4. Skin , Left additional mastectomy flap - BENIGN FIBROADIPOSE TISSUE. - NO EVIDENCE OF MALIGNANCY.    10/30/2016 Miscellaneous    MammaPrint 10/31/26  Result:  High Risk,Luminal type B MPI: -0.368 Average 10-year risk of recurrence untreated: 29%    11/18/2016 Imaging    CT CAP W Contrast 11/18/16 IMPRESSION: 1. Skin thickening and  interstitial changes involving the left breast, likely related to radiation. Surgical changes are also noted with a tissue expander in place. Probable postop fluid collection in the upper outer quadrant breast. No enlarged supraclavicular or axillary lymph nodes. 2. No CT findings suggesting metastatic disease involving the chest, abdomen or pelvis or bony structures. 3. Surgical changes from a left upper lobe wedge resection. No findings for recurrent tumor.    11/23/2016 Imaging    Bone Scan Whole body 11/23/16 IMPRESSION: No definite scintigraphic evidence of osseous metastatic disease as above.    11/25/2016 - 04/07/2017 Chemotherapy    Adjuvant chemotherapy AC every 2 weeks for 4 cycles, 11/25/16-01/05/17, followed by Taxol weekly for 12 cycles starting 01/20/17     04/28/2017 - 06/11/2017 Radiation Therapy     1. Left Chest Wall / 50.4 Gy in 28 fractions 2. Left SCV and PAB / 50.4 Gy in 28 fractions  3. Left Chest Wall Boost / 10 Gy in 5 fractions     05/2017 -  Anti-estrogen oral therapy    Anastrozole initially, then changed to Letrozole in April 2019      HISTORY OF PRESENTING ILLNESS (07/28/16):  Taylor Walsh 62 y.o. 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 She presents to the clinic today by herself. She was referred by her surgeon Dr. Dalbert Batman.   This was discovered by routine screening mammogram. She denies any palpable breast mass or adenopathy. She denies any other new symptoms. She was contacted for her need of a biopsy to further investigate the calcification found in her mammogram. A biopsy was done leading to a diagnosis of her DCIS. She reports having arthritis in the right knee. She reported having a tubal ligation and hysterectomy. She also has family history of breast cancer with mother and sister. She reports to live with her daughter and 2 of her grandchildren. She reports of having history of HTN, arthritis, Thyroidism, being Pre-diabetic,  Osteopenia and is currently back to Smoking. She also reports her brother has a history of blood clots and he was told that it was hereditary which she is following up with Dr. Brigitte Pulse about testing.   GYN HISTORY  Menarchal: 11 LMP: 36 before hysterectomy Contraceptive: BN/A HRT: no GP:G2P2    PREVIOUS THERAPY: Adjuvant chemotherapy AC every 2 weeks for 4 cycles 11/25/16-01/05/17   CURRENT THERAPY:  Start Anastrozole beginning of April 2019. She was switched to Letrozole on 07/16/2017.  INTERVAL HISTORY:   Taylor Walsh is here for a follow up. She is here alone. She is well, except for neuropathy in her LEs. She uses a cane to walk. She reports improving leg cramps.  She is taking calcium and vitamin D.   MEDICAL HISTORY:  Past Medical History:  Diagnosis Date  . Anxiety   . Arthritis   . Cancer ([place]) 10/08/2016   left breast cancer  . Diabetes mellitus without complication ([place])   . Dyslipidemia   . Family history of breast cancer   . Family history of ovarian cancer   . Family history of prostate cancer   . GERD (gastroesophageal reflux disease)    nexium if needed  . Heart murmur   . History of radiation therapy 04/28/17- 06/11/17   Left Chest wall/ 50.4 Gy  in 28 fractions. Left SCV/ 50.4 Gy in 28 fractions. Left Chest wall boost/ 10 Gy in 5 fractions.   . Hypertension   . Hypothyroid   . Osteopenia   . Pneumonia 10/08/2016   June 2013    SURGICAL HISTORY: Past Surgical History:  Procedure Laterality Date  . ABDOMINAL HYSTERECTOMY    . BREAST RECONSTRUCTION WITH PLACEMENT OF TISSUE EXPANDER AND FLEX HD (ACELLULAR HYDRATED DERMIS) Left 10/13/2016   Procedure: LEFT BREAST RECONSTRUCTION WITH PLACEMENT OF TISSUE EXPANDER AND ALLODERM;  Surgeon: Irene Limbo, MD;  Location: [facility];  Service: Plastics;  Laterality: Left;  . FOOT SURGERY     bi-lat/ repaired hammer toes  . MASTECTOMY Left 10/13/2016  . MASTECTOMY W/ SENTINEL NODE BIOPSY Left 10/13/2016  . MASTECTOMY W/  SENTINEL NODE BIOPSY Left 10/13/2016   Procedure: LEFT TOTAL MASTECTOMY WITH LEFT AXILLARY SENTINEL LYMPH NODE BIOPSY;  Surgeon: Coralie Keens, MD;  Location: [facility];  Service: General;  Laterality: Left;  . PORTACATH PLACEMENT Right 11/24/2016   Procedure: INSERTION PORT-A-CATH;  Surgeon: Fanny Skates, MD;  Location: [facility];  Service: General;  Laterality: Right;  . TUBAL LIGATION    . VIDEO ASSISTED THORACOSCOPY (VATS)/WEDGE RESECTION Left 02/25/2015   Procedure: VIDEO ASSISTED THORACOSCOPY (VATS)/ LUL WEDGE RESECTION with ON Q placement.;  Surgeon: Melrose Nakayama, MD;  Location: [facility];  Service: Thoracic;  Laterality: Left;    SOCIAL HISTORY: Social History   Socioeconomic History  . Marital status: Single    Spouse name: Not on file  . Number of children: 2  . Years of education: Not on file  . Highest education level: Not on file  Occupational History  . Not on file  Social Needs  . Financial resource strain: Not on file  . Food insecurity:    Worry: Not on file    Inability: Not on file  . Transportation needs:    Medical: Not on file    Non-medical: Not on file  Tobacco Use  . Smoking status: Former Smoker    Packs/day: 1.00    Years: 42.00    Pack years: 42.00    Last attempt to quit: 08/27/2016    Years since quitting: 1.2  . Smokeless tobacco: Never Used  . Tobacco comment: 5/1/18she smokes occasionally when stressed. She is smoking 4 cigarettes a week  Substance and Sexual Activity  . Alcohol use: No    Alcohol/week: 0.0 standard drinks    Comment: once or twice yearly.  . Drug use: No  . Sexual activity: Not on file  Lifestyle  . Physical activity:    Days per week: Not on file    Minutes per session: Not on file  . Stress: Not on file  Relationships  . Social connections:    Talks on phone: Not on file    Gets together: Not on file    Attends religious service: Not on file    Active member of club or organization: Not on  file    Attends meetings of clubs or organizations: Not on file    Relationship status: Not on file  . Intimate partner violence:    Fear of current or ex partner: Not on file    Emotionally abused: Not on file    Physically abused: Not on file    Forced sexual activity: Not on file  Other Topics Concern  . Not on file  Social History Narrative  . Not on file    FAMILY HISTORY:  Family History  Problem Relation Age of Onset  . Cancer Mother        brain, ovarian  . Hypertension Mother   . Stroke Father   . Hypertension Father   . Cancer Sister 25       bilateral breast cancer, 2nd cancer at 20  . Cancer Brother        dx with prostate cancer in the 43s  . Cancer Brother        dx in his 45s with prostate cancer  . Brain cancer Maternal Uncle   . Cirrhosis Brother   . Cancer Brother        NOS  . Cancer Paternal Aunt        NOS  . Cancer Cousin        paternal cousin with cancer NOS    ALLERGIES:  is allergic to vicodin [hydrocodone-acetaminophen].  MEDICATIONS:  Current Outpatient Medications  Medication Sig Dispense Refill  . alendronate (FOSAMAX) 70 MG tablet Take 70 mg by mouth once a week. Take with a full glass of water on an empty stomach.    Marland Kitchen aspirin 81 MG tablet Take 81 mg by mouth daily.    Marland Kitchen atorvastatin (LIPITOR) 40 MG tablet Take 40 mg by mouth daily.    . calcium-vitamin D 250-100 MG-UNIT tablet Take 2 tablets by mouth daily.     . DULoxetine (CYMBALTA) 30 MG capsule TAKE 1 CAPSULE BY MOUTH ONCE DAILY FOR 7 DAYS IF  TOLERATES  WELL,  INCREASE  TO  2  CAPSULES  DAILY 60 capsule 0  . ergocalciferol (VITAMIN D2) 50000 UNITS capsule Take 50,000 Units by mouth every Friday.     Marland Kitchen ibuprofen (ADVIL,MOTRIN) 600 MG tablet Take 1 tablet (600 mg total) by mouth every 8 (eight) hours as needed. Take with food to protect stomach. 36 tablet 0  . letrozole (FEMARA) 2.5 MG tablet Take 1 tablet (2.5 mg total) by mouth daily. 90 tablet 3  . levothyroxine (SYNTHROID,  LEVOTHROID) 75 MCG tablet   6  . losartan (COZAAR) 50 MG tablet TAKE 1 TABLET BY MOUTH ONCE DAILY FOR 90 DAYS  1  . metFORMIN (GLUCOPHAGE) 500 MG tablet Take 1 tablet (500 mg total) 2 (two) times daily with a meal by mouth. 60 tablet 2  . methocarbamol (ROBAXIN) 500 MG tablet Take 1 tablet (500 mg total) by mouth every 8 (eight) hours as needed for muscle spasms. 30 tablet 0  . traMADol (ULTRAM) 50 MG tablet Take 1 tablet (50 mg total) by mouth every 12 (twelve) hours as needed for moderate pain or severe pain. 30 tablet 0   No current facility-administered medications for this visit.     REVIEW OF SYSTEMS: Constitutional: Denies fevers, chills  Eyes: Denies blurriness of vision, double vision or watery eyes. Denies eye pain, redness, or itching  Ears, nose, mouth, throat, and face: Denies mucositis or nasal congestion  Respiratory: No cough or wheeze.  Cardiovascular: Denies palpitation, chest discomfort or lower extremity swelling Gastrointestinal:  Denies vomiting, constipation, diarrhea, or change in bowel habits  GU/GYN: recurrent herpes outbreak, improving, some residual sensitivity Skin: Denies abnormal skin rashes (+) growing fingernails (+) skin redness/burns from radiation Lymphatics: Denies new lymphadenopathy or easy bruising Musculoskeletal: (+) arthritis in the right knee.  Neurological:Denies new weaknesses (+) mild numbness in her fingertips, increase in neuropathy in her feet (+) change in balance  Breast: negative  Behavioral/Psych: Mood is stable, no new changes  All other systems were  reviewed with the patient and are negative.  PHYSICAL EXAMINATION: ECOG PERFORMANCE STATUS: 1  Vitals:   11/10/17 1114  BP: 139/78  Pulse: 92  Resp: 18  Temp: 98.4 F (36.9 C)  SpO2: 97%   Filed Weights   11/10/17 1114  Weight: 174 lb 9.6 oz (79.2 kg)     GENERAL:alert, no distress and comfortable (+) uses cane SKIN: skin color, texture, turgor are normal, no rashes, (+)  diffuse nail thickening and discoloration, no discharge   EYES: normal, conjunctiva are pink and non-injected, sclera clear OROPHARYNX:no exudate, no erythema and lips, buccal mucosa, and tongue normal . No obvious mucositis NECK: supple, thyroid normal size, non-tender, without nodularity LYMPH:  no palpable  Cervical, supraclavicular, or axillary lymphadenopathy LUNGS: clear to auscultation bilaterally with normal breathing effort HEART: regular rate & rhythm and no murmurs and no lower extremity edema ABDOMEN:abdomen soft, non-tender and normal bowel sounds. No palpable hepatomegaly or masses GU/GYN: external exam reveals healing herpetic lesions to left labia minora Musculoskeletal:no cyanosis of digits and no clubbing  PSYCH: alert & oriented x 3 with fluent speech NEURO: no focal motor (+) mild sensory deficit in fingers, L>R Breasts: Breast inspection showed them to be symmetrical with no nipple discharge. (+) S/p left mastectomy with tissue expander placement with incision well-healed, no tenderness.  (+) diffuse hyperpigmentation over the left breast with mild amount of skin peeling in the left axilla and below/under the breast  LABORATORY DATA:  I have reviewed the data as listed CBC Latest Ref Rng & Units 11/10/2017 05/10/2017 04/07/2017  WBC 3.9 - 10.3 K/uL 4.0 4.0 4.1  Hemoglobin 11.6 - 15.9 g/dL 12.6 12.5 11.1(L)  Hematocrit 34.8 - 46.6 % 39.2 39.4 35.6  Platelets 145 - 400 K/uL 219 198 299    CMP Latest Ref Rng & Units 11/10/2017 05/10/2017 04/07/2017  Glucose 70 - 99 mg/dL 181(H) 120 157(H)  BUN 8 - 23 mg/dL _0 Creatinine 0.44 - 1.00 mg/dL 0.82 0.73 0.76  Sodium 135 - 145 mmol/L 140 139 137  Potassium 3.5 - 5.1 mmol/L 3.9 4.3 4.2  Chloride 98 - 111 mmol/L 102 103 103  CO2 22 - 32 mmol/L _1 Calcium 8.9 - 10.3 mg/dL 9.1 9.9 9.7  Total Protein 6.5 - 8.1 g/dL 7.4 7.1 6.6  Total Bilirubin 0.3 - 1.2 mg/dL 0.8 0.6 0.8  Alkaline Phos 38 - 126 U/L 68 64 67  AST 15 - 41  U/L _2 ALT 0 - 44 U/L _3 PATHOLOGY:   Surgery Diagnosis 10/13/16  1. Lymph node, sentinel, biopsy, Left Axillary - METASTATIC CARCINOMA IN ONE LYMPH NODE (1/1). 2. Lymph node, sentinel, biopsy, Left - METASTATIC CARCINOMA IN ONE LYMPH NODE (1/1). 3. Breast, radical mastectomy (including lymph nodes), Left - INVASIVE DUCTAL CARCINOMA, 0.3 CM. - LYMPHOVASCULAR INVOLVEMENT BY CARCINOMA. - HIGH GRADE DUCTAL CARCINOMA IN SITU WITH CALCIFICATIONS AND NECROSIS, 5.5 CM. - DCIS FOCALLY 0.1 CM FROM CAUTERIZED MEDIAL MARGIN. - SEE ONCOLOGY TABLE AND COMMENT. 4. Skin , Left additional mastectomy flap - BENIGN FIBROADIPOSE TISSUE. - NO EVIDENCE OF MALIGNANCY. Microscopic Comment 3. BREAST, INVASIVE TUMOR Procedure: Mastectomy and two sentinel lymph nodes with additional left mastectomy flap. Laterality: Left breast Tumor Size: 0.3 cm invasive carcinoma; 5.5 cm DCIS Histologic Type: Ductal Grade: II Tubular Differentiation: 2 Nuclear Pleomorphism: 2 Mitotic Count: 2 Ductal Carcinoma in Situ (DCIS): Present, high grade with necrosis Extent of Tumor: Skin: Free of tumor  Nipple: Free of tumor Microscopic Comment(continued) Skeletal muscle: Free of tumor Margins: Free of tumor Invasive carcinoma, distance from closest margin: Greater than 1.5 cm DCIS, distance from closest margin: Less than 0.1 cm from medial margin Regional Lymph Nodes: Number of Lymph Nodes Examined: 2 Number of Sentinel Lymph Nodes Examined: 2 Lymph Nodes with Macrometastases: 2 Lymph Nodes with Micrometastases: 0 Lymph Nodes with Isolated Tumor Cells: 0 Breast Prognostic Profile: Pending Estrogen Receptor: Pending Progesterone Receptor: Pending Her2: Pending Ki-67: Pending Best tumor block for sendout testing: [facility] Pathologic Stage Classification (pTNM, AJCC 8th Edition): Primary Tumor (pT): pT1a Regional Lymph Nodes (pN): pN1a Distant Metastases (pM): pMX Comments: There is extensive high grade  ductal carcinoma in situ with necrosis. Multiple additional portions of the tissue are examined and there is a 0.3 cm invasive ductal carcinoma associated with lymphovascular involvement by tumor. Breast prognostic profile will be performed on the invasive carcinoma and reported as an addendum. Immunohistochemistry shows positive basal cell staining in the DCIS with calponin, smooth muscle myosin and p63. ADDITIONAL INFORMATION: 3. By immunohistochemistry, the tumor cells are Negative for Her2 (1+). Vicente Males MD Pathologist, Electronic Signature ( Signed 10/21/2016) 3. PROGNOSTIC INDICATORS For Part 3[facility] Results: IMMUNOHISTOCHEMICAL AND MORPHOMETRIC ANALYSIS PERFORMED MANUALLY Estrogen Receptor: 95%, POSITIVE, STRONG STAINING INTENSITY Progesterone Receptor: 45%, POSITIVE, STRONG STAINING INTENSITY Proliferation Marker Ki67: 15% ADDITIONAL INFORMATION:(continued) Results: HER2 - *EQUIVOCAL*. OF NOTE, A TOTAL OF 40 TUMOR CELLS WERE EVALUATED FOR HER2 EXPRESSION. HER2 BY IMMUNOHISTOCHEMISTRY WILL BE PERFORMED AND THE RESULTS REPORTED SEPARATELY. RATIO OF HER2/CEP17 SIGNALS 1.67 AVERAGE HER2 COPY NUMBER PER CELL 4.18   Surgery: 07/10/16 Diagnosis 1. 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 - SEE MICROSCOPIC DESCRIPTION. 2. 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 - SEE MICROSCOPIC DESCRIPTION. Microscopic Comment 1. Prognostic markers have been ordered and will be reported in an addendum. 2. Prognostic markers have been ordered and will be reported in an addendum. Called to the [facility] on 07/13/16. Results: IMMUNOHISTOCHEMICAL AND MORPHOMETRIC ANALYSIS PERFORMED MANUALLY Estrogen Receptor: 95%, POSITIVE, STRONG STAINING INTENSITY Progesterone  Receptor: 80%, POSITIVE, STRONG STAINING INTENSITY  Biopsy: 02/25/15 Diagnosis 1. 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 - NO ATYPIA OR MALIGNANCY IDENTIFIED. - SEE COMMENT. 2. Lymph node, biopsy, 12 L - ONE BENIGN LYMPH NODE WITH NO TUMOR SEEN (0/1). 3. Lymph node, biopsy, 9 L - ONE BENIGN LYMPH NODE WITH NO TUMOR SEEN (0/1). Microscopic Comment 1. A Congo Red stain is performed and a crystal violet stain is performed on the lung specimen. The staining pattern (apple green birefringence) coupled with the morphology is consistent with amyloid deposition. Dr. Zenda Alpers, Dr. Fletcher Anon and Dr. Darl Householder have all seen this case in consultation with agreement that amyloid deposition is present. Dr. Donato Heinz has also seen this case in consultation with agreement of the additional findings in the case.   MammaPrint 10/31/26 Result:  High Risk at 94.6% MPI: -0.368 Average 10-year risk of recurrence untreated: 29%   Genetics 07/29/16    PROCEDURES   ECHO 11/17/16 Study Conclusions - Left ventricle: The cavity size was normal. Wall thickness was   normal. Systolic function was normal. The estimated ejection   fraction was in the range of 60% to 65%.  Wall motion was normal;   there were no regional wall motion abnormalities. Features are   consistent with a pseudonormal left ventricular filling pattern,   with concomitant abnormal relaxation and increased filling   pressure (grade 2 diastolic dysfunction). - Aortic valve: There was mild regurgitation. Valve area (VTI):   1.84 cm^2. Valve area (Vmax): 1.76 cm^2. Valve area (Vmean): 1.89   cm^2.    RADIOGRAPHIC STUDIES: I have personally reviewed the radiological images as listed and agreed with the findings in the report. No results found.  07/07/2017 Mammogram IMPRESSION: No mammographic evidence of malignancy. A  result letter of this screening mammogram will be mailed directly to the patient.  Whole Body Bone Scan 11/23/16 IMPRESSION: No definite scintigraphic evidence of osseous metastatic disease as above.  CT CAP W Contrast 11/18/16 IMPRESSION: 1. Skin thickening and interstitial changes involving the left breast, likely related to radiation. Surgical changes are also noted with a tissue expander in place. Probable postop fluid collection in the upper outer quadrant breast. No enlarged supraclavicular or axillary lymph nodes. 2. No CT findings suggesting metastatic disease involving the chest, abdomen or pelvis or bony structures. 3. Surgical changes from a left upper lobe wedge resection. No findings for recurrent tumor.  Diagnostic Mammogram 07/27/16 IMPRESSION: 1. Extensive mass and non mass enhancement along the lower inner aspect the left breast, lower outer quadrant, spanning 10.2 cm from anterior to posterior, including both biopsied lesions, as detailed above. This is all consistent with DCIS. 2. No other abnormal enhancement in the left breast. 3. No abnormal or enlarged axillary lymph nodes. 4. No evidence of malignancy in the right breast.  ASSESSMENT & PLAN:  Taylor Walsh is 62 y.o. who present to the clinic with Ductal carcinoma in situ (DCIS) of left breast  1. 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 -She had mostly DCIS in her breast but There was small component of invasive cancer that has spread to 2 of her lymph nodes.  Her surgical margins were negative. -Her mammaprint showed high-risk disease, luminal type B -Her staging scan was negative for distant metastasis, or residual axillary adenopathy. -She received adjuvant  chemotherapy with dose dense AC for 4 cycles, followed by weekly Taxol for 12 weeks.  She had neutropenia required G-CSF, and peripheral neuropathy required Taxol dose reduction. -she has completed adjuvant breast radiation -She has started adjuvant aromatase inhibitor in 06/2017.  She started with anastrozole, did not tolerated well, and was switched to letrozole. -She is tolerating letrozole well overall.  She is clinically doing well, except neuropathy especially in her feet.  She has been using cane, she had tripped and felt a few times. -Labs reviewed with the pt, they are WNLs. She is tolerating Letrozole well.  Her exam was unremarkable, no clinical concern for recurrence. -She attends surviviorship clinic  -F/u in 5 months  2. Arthritis -She experiences mild arthritis in LE, more specific to her right knee -She is pretty active -Will monitor while on Letrozole    3. Osteopenia  -Last bone density scan ordered by Dr Brigitte Pulse in the last 2 years -Will f/u with PCP and scans every 2 years -I explained that Letrozole can weaken her bone and she will f/u with Dr. Brigitte Pulse to make sure she is up to date on  DEXA  4. Smoking cessation -She use to smoke approximately 1 pack a week.  -The patient quit smoking in 07/2016  5. 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 The Hereditary Gene Panel offered by [facility] includes sequencing and/or deletion duplication testing of the following 46 genes: APC, ATM, AXIN2, BARD1, BMPR1A, BRCA1, BRCA2, BRIP1, CDH1, CDKN2A (p14ARF), CDKN2A (p16INK4a), CHEK2, CTNNA1, DICER1, EPCAM (Deletion/duplication testing only), GREM1 (promoter region deletion/duplication testing only), KIT, MEN1, MLH1, MSH2, MSH3, MSH6, MUTYH, NBN, NF1, NHTL1, PALB2, PDGFRA, PMS2, POLD1, POLE, PTEN, RAD50, RAD51C, RAD51D, SDHB, SDHC, SDHD, SMAD4, SMARCA4. STK11, TP53, TSC1, TSC2, and VHL.  The following gene was evaluated for sequence changes only: SDHA and HOXB13  c.251G>A variant only.  The report date is Aug 09, 2016.  6. History of pulmonary amyloidosis -She was found to have a lung nodule in October 2016, which was surgically removed, and showed pulmonary amyloidosis -Previous lab showed no evidence of systemic amyloidosis -Continue monitoring  7.  Chemo induced peripheral Neuropathy, G2 -Overall stable -I previously encouraged her to take vitamin B12 or B6. -She tried gabapentin, not very effective.  I have switched her to Cymbalta, she is on 60 mg daily, which helps. -I previously prescribed Tramadol for her use for severe pain occasionally in 08/2017, has not need refill.    9. Hyperglycemia   -She denies history of diabetic. Her BG is high today at 181. She is on metformin -She is not following a low carb diet. I advised her to lower her carb intake and maintain a healthy diet. -I discussed that letrozole may increase her blood glucose  -f/u with PCP  PLAN:  -Lab reviewed, she is doing well, except peripheral neuropath -Mammogram next year -Continue Letrozole, refilled today -F/u in 6 months with lab, she will see Dr. Dalbert Batman in 2-3 moths    All questions were answered. The patient knows to call the clinic with any problems, questions or concerns. I spent 25 minutes counseling the patient face to face. The total time spent in the appointment was 30 minutes and more than 50% was on counseling.  Dierdre Searles Dweik am acting as scribe for Dr. Truitt Merle.  I have reviewed the above documentation for accuracy and completeness, and I agree with the above.    Truitt Merle, MD 11/10/17

## 2017-11-08 ENCOUNTER — Other Ambulatory Visit: Payer: Self-pay | Admitting: Nurse Practitioner

## 2017-11-08 DIAGNOSIS — G620 Drug-induced polyneuropathy: Secondary | ICD-10-CM | POA: Insufficient documentation

## 2017-11-08 DIAGNOSIS — T451X5A Adverse effect of antineoplastic and immunosuppressive drugs, initial encounter: Secondary | ICD-10-CM

## 2017-11-08 DIAGNOSIS — C50212 Malignant neoplasm of upper-inner quadrant of left female breast: Secondary | ICD-10-CM

## 2017-11-08 DIAGNOSIS — Z170 Estrogen receptor positive status [ER+]: Principal | ICD-10-CM

## 2017-11-10 ENCOUNTER — Encounter: Payer: Self-pay | Admitting: Hematology

## 2017-11-10 ENCOUNTER — Inpatient Hospital Stay (HOSPITAL_BASED_OUTPATIENT_CLINIC_OR_DEPARTMENT_OTHER): Payer: BC Managed Care – PPO | Admitting: Hematology

## 2017-11-10 ENCOUNTER — Inpatient Hospital Stay: Payer: BC Managed Care – PPO | Attending: Hematology

## 2017-11-10 VITALS — BP 139/78 | HR 92 | Temp 98.4°F | Resp 18 | Ht 63.0 in | Wt 174.6 lb

## 2017-11-10 DIAGNOSIS — Z79811 Long term (current) use of aromatase inhibitors: Secondary | ICD-10-CM

## 2017-11-10 DIAGNOSIS — Z170 Estrogen receptor positive status [ER+]: Secondary | ICD-10-CM | POA: Diagnosis not present

## 2017-11-10 DIAGNOSIS — D709 Neutropenia, unspecified: Secondary | ICD-10-CM | POA: Insufficient documentation

## 2017-11-10 DIAGNOSIS — C50212 Malignant neoplasm of upper-inner quadrant of left female breast: Secondary | ICD-10-CM

## 2017-11-10 DIAGNOSIS — T451X5A Adverse effect of antineoplastic and immunosuppressive drugs, initial encounter: Secondary | ICD-10-CM

## 2017-11-10 DIAGNOSIS — G629 Polyneuropathy, unspecified: Secondary | ICD-10-CM

## 2017-11-10 DIAGNOSIS — D6481 Anemia due to antineoplastic chemotherapy: Secondary | ICD-10-CM

## 2017-11-10 DIAGNOSIS — M8580 Other specified disorders of bone density and structure, unspecified site: Secondary | ICD-10-CM | POA: Diagnosis not present

## 2017-11-10 DIAGNOSIS — I10 Essential (primary) hypertension: Secondary | ICD-10-CM

## 2017-11-10 DIAGNOSIS — G620 Drug-induced polyneuropathy: Secondary | ICD-10-CM

## 2017-11-10 LAB — COMPREHENSIVE METABOLIC PANEL
ALBUMIN: 3.9 g/dL (ref 3.5–5.0)
ALT: 25 U/L (ref 0–44)
ANION GAP: 12 (ref 5–15)
AST: 23 U/L (ref 15–41)
Alkaline Phosphatase: 68 U/L (ref 38–126)
BILIRUBIN TOTAL: 0.8 mg/dL (ref 0.3–1.2)
BUN: 15 mg/dL (ref 8–23)
CHLORIDE: 102 mmol/L (ref 98–111)
CO2: 26 mmol/L (ref 22–32)
Calcium: 9.1 mg/dL (ref 8.9–10.3)
Creatinine, Ser: 0.82 mg/dL (ref 0.44–1.00)
GFR calc Af Amer: >60 mL/min (ref >60)
GFR calc non Af Amer: >60 mL/min (ref >60)
GLUCOSE: 181 mg/dL — AB (ref 70–99)
POTASSIUM: 3.9 mmol/L (ref 3.5–5.1)
SODIUM: 140 mmol/L (ref 135–145)
TOTAL PROTEIN: 7.4 g/dL (ref 6.5–8.1)

## 2017-11-10 LAB — CBC WITH DIFFERENTIAL/PLATELET
BASOS ABS: 0 10*3/uL (ref 0.0–0.1)
Basophils Relative: 1 %
Eosinophils Absolute: 0.2 10*3/uL (ref 0.0–0.5)
Eosinophils Relative: 5 %
HEMATOCRIT: 39.2 % (ref 34.8–46.6)
Hemoglobin: 12.6 g/dL (ref 11.6–15.9)
LYMPHS ABS: 1.3 10*3/uL (ref 0.9–3.3)
LYMPHS PCT: 33 %
MCH: 27 pg (ref 25.1–34.0)
MCHC: 32.1 g/dL (ref 31.5–36.0)
MCV: 83.9 fL (ref 79.5–101.0)
MONO ABS: 0.4 10*3/uL (ref 0.1–0.9)
Monocytes Relative: 11 %
NEUTROS ABS: 2 10*3/uL (ref 1.5–6.5)
Neutrophils Relative %: 50 %
Platelets: 219 10*3/uL (ref 145–400)
RBC: 4.67 MIL/uL (ref 3.70–5.45)
RDW: 16.5 % — AB (ref 11.2–14.5)
WBC: 4 10*3/uL (ref 3.9–10.3)

## 2017-11-10 MED ORDER — LETROZOLE 2.5 MG PO TABS: 2.5000 mg | ORAL_TABLET | Freq: Every day | ORAL | 3 refills | Status: DC

## 2017-11-11 ENCOUNTER — Telehealth: Payer: Self-pay | Admitting: Hematology

## 2017-11-11 IMAGING — CR DG CHEST 1V PORT
1 series · 1 of 1 positions shown · non-contrast
Comparison: Chest x-ray of March 12, 2015

CLINICAL DATA: Status post Port-A-Cath appliance placement on the
right. Left breast malignancy.

EXAM:
PORTABLE CHEST 1 VIEW

[ap]
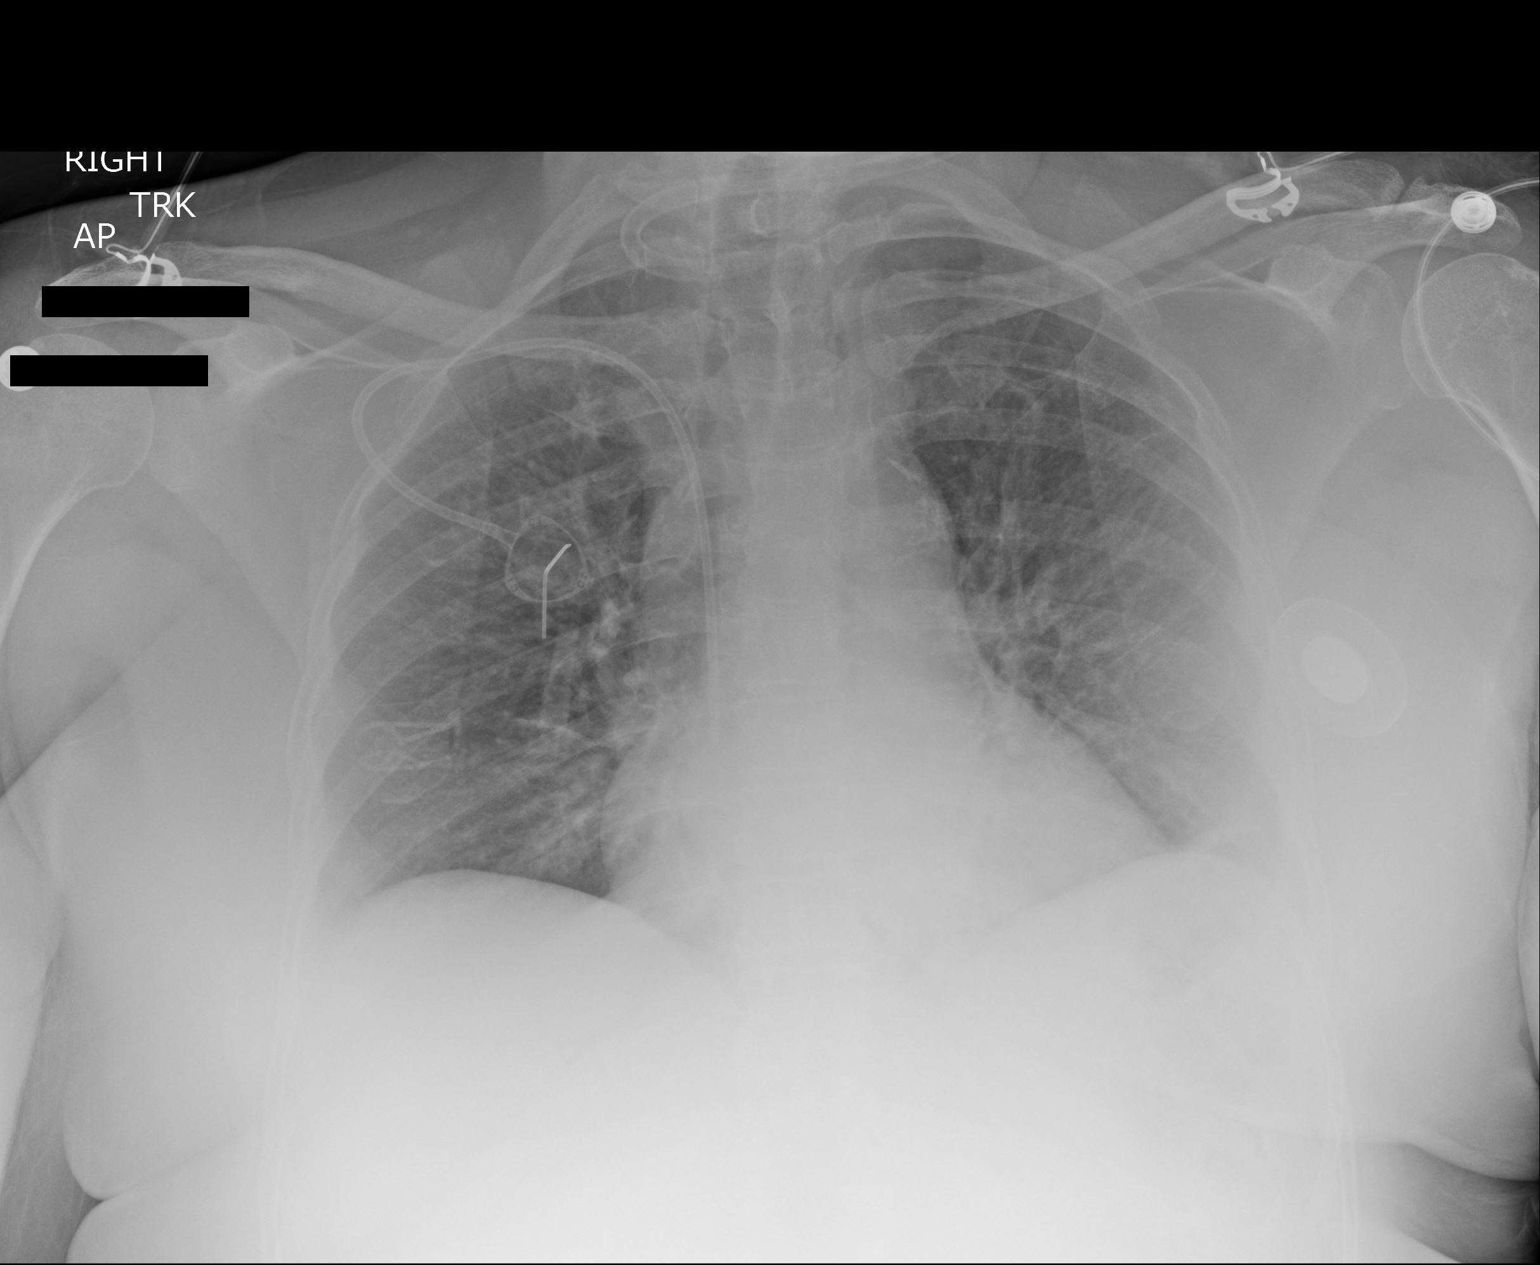

[1 of 1 positions shown; findings below may reference images not displayed]

FINDINGS: The lungs are adequately inflated. There is no postprocedure
pneumothorax following porta catheter placement. The tip of the
catheter projects at the junction of the middle and distal portions
of the SVC. The reservoir overlies the mid thorax. The cardiac
silhouette is top-normal in size. The central pulmonary vascularity
is mildly prominent but has since accentuated by the portable
technique. There is calcification in the wall of the aortic arch.
The bony thorax exhibits no acute abnormality.
IMPRESSION: No immediate postprocedure complication following Port-A-Cath
appliance placement.

Thoracic aortic atherosclerosis.

## 2017-11-11 NOTE — Telephone Encounter (Signed)
Appts scheduled Letter/Calendar mailed per 8/14 los

## 2017-12-01 ENCOUNTER — Other Ambulatory Visit: Payer: Self-pay | Admitting: Hematology

## 2017-12-07 ENCOUNTER — Other Ambulatory Visit: Payer: Self-pay

## 2017-12-07 DIAGNOSIS — G620 Drug-induced polyneuropathy: Secondary | ICD-10-CM

## 2017-12-07 DIAGNOSIS — T451X5A Adverse effect of antineoplastic and immunosuppressive drugs, initial encounter: Secondary | ICD-10-CM

## 2017-12-07 MED ORDER — DULOXETINE HCL 60 MG PO CPEP: 60.0000 mg | ORAL_CAPSULE | Freq: Two times a day (BID) | ORAL | 2 refills | Status: DC

## 2018-01-27 DIAGNOSIS — Z853 Personal history of malignant neoplasm of breast: Secondary | ICD-10-CM | POA: Insufficient documentation

## 2018-01-30 ENCOUNTER — Encounter: Payer: Self-pay | Admitting: Genetic Counselor

## 2018-01-30 NOTE — Progress Notes (Signed)
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  The change in variant classification was made as a result of re-review of the evidence in light of new variant interpretation guidelines and /or new information. The updated report date is January 26, 2018.

## 2018-02-22 DIAGNOSIS — T81321A Disruption or dehiscence of closure of internal operation (surgical) wound of abdominal wall muscle or fascia, initial encounter: Secondary | ICD-10-CM | POA: Insufficient documentation

## 2018-02-22 DIAGNOSIS — Z9889 Other specified postprocedural states: Secondary | ICD-10-CM | POA: Insufficient documentation

## 2018-02-22 DIAGNOSIS — T8130XA Disruption of wound, unspecified, initial encounter: Secondary | ICD-10-CM | POA: Insufficient documentation

## 2018-03-15 ENCOUNTER — Other Ambulatory Visit: Payer: Self-pay | Admitting: Hematology

## 2018-03-15 DIAGNOSIS — T451X5A Adverse effect of antineoplastic and immunosuppressive drugs, initial encounter: Principal | ICD-10-CM

## 2018-03-15 DIAGNOSIS — G620 Drug-induced polyneuropathy: Secondary | ICD-10-CM

## 2018-03-21 ENCOUNTER — Other Ambulatory Visit: Payer: Self-pay | Admitting: Hematology

## 2018-03-21 DIAGNOSIS — C50212 Malignant neoplasm of upper-inner quadrant of left female breast: Secondary | ICD-10-CM

## 2018-03-21 DIAGNOSIS — Z170 Estrogen receptor positive status [ER+]: Principal | ICD-10-CM

## 2018-03-28 ENCOUNTER — Other Ambulatory Visit: Payer: Self-pay | Admitting: Hematology

## 2018-03-28 ENCOUNTER — Other Ambulatory Visit: Payer: Self-pay

## 2018-03-28 DIAGNOSIS — Z170 Estrogen receptor positive status [ER+]: Principal | ICD-10-CM

## 2018-03-28 DIAGNOSIS — C50212 Malignant neoplasm of upper-inner quadrant of left female breast: Secondary | ICD-10-CM

## 2018-03-28 MED ORDER — LETROZOLE 2.5 MG PO TABS: 2.5000 mg | ORAL_TABLET | Freq: Every day | ORAL | 3 refills | Status: DC

## 2018-04-11 ENCOUNTER — Other Ambulatory Visit: Payer: Self-pay

## 2018-04-11 DIAGNOSIS — R739 Hyperglycemia, unspecified: Secondary | ICD-10-CM

## 2018-04-11 DIAGNOSIS — G620 Drug-induced polyneuropathy: Secondary | ICD-10-CM

## 2018-04-11 DIAGNOSIS — T451X5A Adverse effect of antineoplastic and immunosuppressive drugs, initial encounter: Secondary | ICD-10-CM

## 2018-04-11 MED ORDER — METFORMIN HCL 500 MG PO TABS: ORAL_TABLET | ORAL | 2 refills | Status: AC

## 2018-04-11 MED ORDER — DULOXETINE HCL 60 MG PO CPEP: 60.0000 mg | ORAL_CAPSULE | Freq: Two times a day (BID) | ORAL | 2 refills | Status: DC

## 2018-04-11 NOTE — Telephone Encounter (Signed)
Patient calls for refills on Cymbalta and Metformin to be sent to [place], [place].  Done.

## 2018-04-19 ENCOUNTER — Other Ambulatory Visit: Payer: Self-pay | Admitting: Nurse Practitioner

## 2018-04-21 ENCOUNTER — Other Ambulatory Visit: Payer: Self-pay | Admitting: Nurse Practitioner

## 2018-05-12 ENCOUNTER — Telehealth: Payer: Self-pay | Admitting: Hematology

## 2018-05-12 NOTE — Telephone Encounter (Signed)
Called patient per 2/12 sch message - unable to reach pt . Left message for pt to call back

## 2018-05-13 ENCOUNTER — Inpatient Hospital Stay: Payer: BC Managed Care – PPO

## 2018-05-13 ENCOUNTER — Inpatient Hospital Stay: Payer: BC Managed Care – PPO | Admitting: Hematology

## 2018-05-18 NOTE — Progress Notes (Signed)
Taylor Walsh   [phone] [phone]   Clinic Follow up Note   Patient Care Team: Marton Redwood, MD as PCP - General (Internal Medicine) Melrose Nakayama, MD as Consulting Physician (Cardiothoracic Surgery) Fanny Skates, MD as Consulting Physician (General Surgery) Truitt Merle, MD as Consulting Physician (Hematology) Delice Bison, Charlestine Massed, NP as Nurse Practitioner (Hematology and Oncology) Eppie Gibson, MD as Attending Physician (Radiation Oncology)  Date of Service:  05/20/2018  CHIEF COMPLAINT: F/u left breast cancer   SUMMARY OF ONCOLOGIC HISTORY: Oncology History   Cancer Staging Breast cancer of upper-inner quadrant of left female breast [facility]) Staging form: Breast, AJCC 8th Edition - Clinical stage from 07/10/2016: Stage 0 (cTis (DCIS), cN0, cM0, G3, ER: Positive, PR: Positive, HER2: Not Assessed) - Signed by Truitt Merle, MD on 07/28/2016 - Pathologic stage from 10/26/2016: Stage IA (pT1a, pN1a, cM0, G2, ER: Positive, PR: Positive, HER2: Negative) - Signed by Truitt Merle, MD on 11/11/2016       Breast cancer of upper-inner quadrant of left female breast ([place])   07/10/2016 Initial Biopsy    Diagnosis 1. 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 2. 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    07/10/2016 Receptors her2    ER 95%, PR 80-90%+,  both strong staining, HER2 - (1+), Ki67 80-90%    07/22/2016 Initial Diagnosis    Ductal carcinoma in situ (DCIS) of left breast    07/27/2016 Imaging    Breast MRI w wo contrast showed  1. Extensive mass and non mass enhancement along the lower inner aspect the left breast, lower outer quadrant, spanning 10.2 cm from anterior to posterior, including both biopsied lesions, as  detailed above. This is all consistent with DCIS. 2. No other abnormal enhancement in the left breast. 3. No abnormal or enlarged axillary lymph nodes. 4. 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 The Hereditary Gene Panel offered by [facility] includes sequencing and/or deletion duplication testing of the following 46 genes: APC, ATM, AXIN2, BARD1, BMPR1A, BRCA1, BRCA2, BRIP1, CDH1, CDKN2A (p14ARF), CDKN2A (p16INK4a), CHEK2, CTNNA1, DICER1, EPCAM (Deletion/duplication testing only), GREM1 (promoter region deletion/duplication testing only), KIT, MEN1, MLH1, MSH2, MSH3, MSH6, MUTYH, NBN, NF1, NHTL1, PALB2, PDGFRA, PMS2, POLD1, POLE, PTEN, RAD50, RAD51C, RAD51D, SDHB, SDHC, SDHD, SMAD4, SMARCA4. STK11, TP53, TSC1, TSC2, and VHL.  The following gene was evaluated for sequence changes only: SDHA and HOXB13 c.251G>A variant only.  The report date is Aug 09, 2016. 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  The change in variant classification was made as a result of re-review of the evidence in light of new variant interpretation guidelines and /or new information. The updated report date is January 26, 2018.     10/13/2016 Surgery    LEFT TOTAL MASTECTOMY WITH LEFT AXILLARY SENTINEL LYMPH NODE BIOPSY By Dr. Ninfa Linden    10/13/2016 Pathology Results    Surgery Diagnosis 10/13/16  1. Lymph node, sentinel, biopsy, Left Axillary - METASTATIC CARCINOMA IN ONE LYMPH NODE (1/1). 2. Lymph node, sentinel, biopsy, Left - METASTATIC CARCINOMA IN ONE LYMPH NODE (1/1). 3. Breast, radical mastectomy (including lymph nodes), Left - INVASIVE DUCTAL CARCINOMA, 0.3 CM. - LYMPHOVASCULAR INVOLVEMENT BY CARCINOMA. -  HIGH GRADE DUCTAL CARCINOMA IN SITU WITH CALCIFICATIONS AND NECROSIS, 5.5 CM. - DCIS FOCALLY 0.1 CM FROM CAUTERIZED MEDIAL MARGIN. - SEE ONCOLOGY TABLE AND COMMENT. 4. Skin , Left  additional mastectomy flap - BENIGN FIBROADIPOSE TISSUE. - NO EVIDENCE OF MALIGNANCY.    10/30/2016 Miscellaneous    MammaPrint 10/31/26 Result:  High Risk,Luminal type B MPI: -0.368 Average 10-year risk of recurrence untreated: 29%    11/18/2016 Imaging    CT CAP W Contrast 11/18/16 IMPRESSION: 1. Skin thickening and interstitial changes involving the left breast, likely related to radiation. Surgical changes are also noted with a tissue expander in place. Probable postop fluid collection in the upper outer quadrant breast. No enlarged supraclavicular or axillary lymph nodes. 2. No CT findings suggesting metastatic disease involving the chest, abdomen or pelvis or bony structures. 3. Surgical changes from a left upper lobe wedge resection. No findings for recurrent tumor.    11/23/2016 Imaging    Bone Scan Whole body 11/23/16 IMPRESSION: No definite scintigraphic evidence of osseous metastatic disease as above.    11/25/2016 - 04/07/2017 Chemotherapy    Adjuvant chemotherapy AC every 2 weeks for 4 cycles, 11/25/16-01/05/17, followed by Taxol weekly for 12 cycles starting 01/20/17     04/28/2017 - 06/11/2017 Radiation Therapy     1. Left Chest Wall / 50.4 Gy in 28 fractions 2. Left SCV and PAB / 50.4 Gy in 28 fractions  3. Left Chest Wall Boost / 10 Gy in 5 fractions     05/2017 -  Anti-estrogen oral therapy    Anastrozole initially, then changed to Letrozole in April 2019      CURRENT THERAPY:  Started Anastrozole in 05/2017. Switched to Letrozole in 06/2017  INTERVAL HISTORY:  Taylor Walsh is here for a follow up of left breast cancer. She was last seen by me 6 months ago. She presents to the clinic today by herself. She notes she is doing well overall. She had seen her plastic surgeon this week because she feels a hernia of her left abdomen. This occurred from her breast reconstruction surgery. She will follow up with him. She plans to have a right breast reduction on 4/1 due to  her asymmetrical breast.  She notes her neuropathy is mostly tinging and burning sensation. Her pain is most at night. This effects her hand and feet and causes hand cramping when driving. She would like a handicap sticker for her neuropathy.  She also has right abdominal muscle spasm. This is positional.  She notes the 40 pounds she loss during the chemo, she has gained back but did not want to gain it back.  She notes recently she has felt tired/fatigues even with adequate sleep. She did try Ambien for her insomnia but with 2 pills it was harder to wake her. She did not take it again. She notes difficulty recalling names, terminology occasionally. She denies overall memory issues.    REVIEW OF SYSTEMS:   Constitutional: Denies fevers, chills (+) weight gain (+) intermittent Insomnia  Eyes: Denies blurriness of vision, double vision or watery eyes Ears, nose, mouth, throat, and face: Denies mucositis or sore throat Respiratory: Denies cough, dyspnea or wheezes Cardiovascular: Denies palpitation, chest discomfort or lower extremity swelling Gastrointestinal:  Denies nausea, heartburn or change in bowel habits (+) Occasional left  upper abdominal muscle spasms. (+) LRQ Hernia  Skin: Denies abnormal skin rashes Lymphatics: Denies new lymphadenopathy or easy bruising Neurological:Denies new weaknesses (+) Neuropathy, burning sensation   Behavioral/Psych: Mood  is stable, no new changes  All other systems were reviewed with the patient and are negative.   MEDICAL HISTORY:  Past Medical History:  Diagnosis Date  . Anxiety   . Arthritis   . Cancer ([place]) 10/08/2016   left breast cancer  . Diabetes mellitus without complication ([place])   . Dyslipidemia   . Family history of breast cancer   . Family history of ovarian cancer   . Family history of prostate cancer   . GERD (gastroesophageal reflux disease)    nexium if needed  . Heart murmur   . History of radiation therapy 04/28/17- 06/11/17    Left Chest wall/ 50.4 Gy in 28 fractions. Left SCV/ 50.4 Gy in 28 fractions. Left Chest wall boost/ 10 Gy in 5 fractions.   . Hypertension   . Hypothyroid   . Osteopenia   . Pneumonia 10/08/2016   June 2013    SURGICAL HISTORY: Past Surgical History:  Procedure Laterality Date  . ABDOMINAL HYSTERECTOMY    . BREAST RECONSTRUCTION WITH PLACEMENT OF TISSUE EXPANDER AND FLEX HD (ACELLULAR HYDRATED DERMIS) Left 10/13/2016   Procedure: LEFT BREAST RECONSTRUCTION WITH PLACEMENT OF TISSUE EXPANDER AND ALLODERM;  Surgeon: Irene Limbo, MD;  Location: [place];  Service: Plastics;  Laterality: Left;  . FOOT SURGERY     bi-lat/ repaired hammer toes  . MASTECTOMY Left 10/13/2016  . MASTECTOMY W/ SENTINEL NODE BIOPSY Left 10/13/2016  . MASTECTOMY W/ SENTINEL NODE BIOPSY Left 10/13/2016   Procedure: LEFT TOTAL MASTECTOMY WITH LEFT AXILLARY SENTINEL LYMPH NODE BIOPSY;  Surgeon: Coralie Keens, MD;  Location: [place];  Service: General;  Laterality: Left;  . PORTACATH PLACEMENT Right 11/24/2016   Procedure: INSERTION PORT-A-CATH;  Surgeon: Fanny Skates, MD;  Location: [place];  Service: General;  Laterality: Right;  . TUBAL LIGATION    . VIDEO ASSISTED THORACOSCOPY (VATS)/WEDGE RESECTION Left 02/25/2015   Procedure: VIDEO ASSISTED THORACOSCOPY (VATS)/ LUL WEDGE RESECTION with ON Q placement.;  Surgeon: Melrose Nakayama, MD;  Location: [place];  Service: Thoracic;  Laterality: Left;    I have reviewed the social history and family history with the patient and they are unchanged from previous note.  ALLERGIES:  is allergic to vicodin [hydrocodone-acetaminophen].  MEDICATIONS:  Current Outpatient Medications  Medication Sig Dispense Refill  . alendronate (FOSAMAX) 70 MG tablet Take 70 mg by mouth once a week. Take with a full glass of water on an empty stomach.    Marland Kitchen aspirin 81 MG tablet Take 81 mg by mouth daily.    Marland Kitchen atorvastatin (LIPITOR) 40 MG tablet Take 40 mg by mouth  daily.    . calcium-vitamin D 250-100 MG-UNIT tablet Take 2 tablets by mouth daily.     . ergocalciferol (VITAMIN D2) 50000 UNITS capsule Take 50,000 Units by mouth every Friday.     Marland Kitchen ibuprofen (ADVIL,MOTRIN) 600 MG tablet Take 1 tablet (600 mg total) by mouth every 8 (eight) hours as needed. Take with food to protect stomach. 36 tablet 0  . letrozole (FEMARA) 2.5 MG tablet Take 1 tablet (2.5 mg total) by mouth daily. 90 tablet 3  . levothyroxine (SYNTHROID, LEVOTHROID) 75 MCG tablet   6  . losartan (COZAAR) 50 MG tablet TAKE 1 TABLET BY MOUTH ONCE DAILY FOR 90 DAYS  1  . metFORMIN (GLUCOPHAGE) 500 MG tablet TAKE 1 TABLET BY MOUTH TWICE DAILY WITH A MEAL 60 tablet 2  . traMADol (ULTRAM) 50 MG tablet Take 1 tablet (50 mg total) by  mouth every 12 (twelve) hours as needed for moderate pain or severe pain. (Patient not taking: Reported on 05/20/2018) 30 tablet 0   No current facility-administered medications for this visit.     PHYSICAL EXAMINATION: ECOG PERFORMANCE STATUS: 1 - Symptomatic but completely ambulatory  Vitals:   05/20/18 1515  BP: (!) 142/86  Pulse: (!) 105  Resp: 20  SpO2: 100%   Filed Weights   05/20/18 1515  Weight: 199 lb 14.4 oz (90.7 kg)    GENERAL:alert, no distress and comfortable SKIN: skin color, texture, turgor are normal, no rashes or significant lesions EYES: normal, Conjunctiva are pink and non-injected, sclera clear OROPHARYNX:no exudate, no erythema and lips, buccal mucosa, and tongue normal  NECK: supple, thyroid normal size, non-tender, without nodularity LYMPH:  no palpable lymphadenopathy in the cervical, axillary or inguinal LUNGS: clear to auscultation and percussion with normal breathing effort HEART: regular rate & rhythm and no murmurs and no lower extremity edema ABDOMEN:abdomen soft and normal bowel sounds (+) Mild RLQ tenderness Musculoskeletal:no cyanosis of digits and no clubbing  NEURO: alert & oriented x 3 with fluent speech, no focal  motor/sensory deficits BREAST: S/p left mastectomy: Surgical incision healed well. (+) Asymmetrical breasts  (+) No palpable mass or adenopathy   LABORATORY DATA:  I have reviewed the data as listed CBC Latest Ref Rng & Units 05/20/2018 11/10/2017 05/10/2017  WBC 4.0 - 10.5 K/uL 4.4 4.0 4.0  Hemoglobin 12.0 - 15.0 g/dL 12.1 12.6 12.5  Hematocrit 36.0 - 46.0 % 39.0 39.2 39.4  Platelets 150 - 400 K/uL 249 219 198     CMP Latest Ref Rng & Units 05/20/2018 11/10/2017 05/10/2017  Glucose 70 - 99 mg/dL 177(H) 181(H) 120  BUN 8 - 23 mg/dL '13 15 8  ' Creatinine 0.44 - 1.00 mg/dL 0.86 0.82 0.73  Sodium 135 - 145 mmol/L 141 140 139  Potassium 3.5 - 5.1 mmol/L 4.0 3.9 4.3  Chloride 98 - 111 mmol/L 104 102 103  CO2 22 - 32 mmol/L '27 26 28  ' Calcium 8.9 - 10.3 mg/dL 9.5 9.1 9.9  Total Protein 6.5 - 8.1 g/dL 7.5 7.4 7.1  Total Bilirubin 0.3 - 1.2 mg/dL 0.6 0.8 0.6  Alkaline Phos 38 - 126 U/L 68 68 64  AST 15 - 41 U/L '21 23 18  ' ALT 0 - 44 U/L '24 25 14      ' RADIOGRAPHIC STUDIES: I have personally reviewed the radiological images as listed and agreed with the findings in the report. No results found.   ASSESSMENT & PLAN:  Taylor Walsh is a 63 y.o. female with   1. 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 She is s/p left mastectomy with reconstruction, adjuvant chemo AC-T and radiation.  -She started anti-estrogen therapy with Anastrozole in 05/2017 but due to poor tolerance she switched to Letrozole in 06/2017. She is tolerating well overall  -She plans to have right breast reduction due to her current asymmetrical breasts.  -From a breast cancer standpoint, She is clinically doing well. Lab reviewed, her CBC and CMP WNL except BG at 177. Her physical exam and her 06/2017 mammogram were unremarkable. There is no clinical concern for  recurrence -Continue Surveillance, next mammogram in 06/2018  -Continue Letrozole. I discussed this can lead to weight gain. I strongly encouraged her to watch what she eats and reduce  carbohydrate and sweats.  -I offered her the chance to screen for the Remember study post chemotherapy. She is interested in screening and more information. She will see Research nurse today.  -F/u in 6 months   2. Arthritis -She experiences mild arthritis in LE, more specific to her right knee -She is pretty active -Will monitor while on Letrozole    3. Osteopenia  -Last bone density scan ordered by Dr Brigitte Pulse in 2016 -DEXA from 07/28/17 shows Osteopenia  -She will f/u with Dr. Brigitte Pulse  4. Smoking cessation: The patient quit smoking in 07/2016  5. 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 Otherwise negative.   6. History of pulmonary amyloidosis -She was found to have a lung nodule in October 2016, which was surgically removed, and showed pulmonary amyloidosis -Previous lab showed no evidence of systemic amyloidosis -Continue monitoring  7. Chemo induced peripheral Neuropathy, G2 -I previously encouraged her to take vitamin B12 or B6. -She tried gabapentin, not very effective.  I have switched her to Cymbalta, she is on 60 mg daily, which helps.  -I previously prescribed Tramadol for her use for severe pain occasionally in 08/2017, has not need refill.  -Remains moderate, worse at night, stable. This is limiting her activity level.  -I suggested PT or Acupuncture. She will let me know when she is ready for PT referral. If persistent or worsens I suggest she see a neurologist.  -I renewed her handicap application for 6 months  8. DM -She is on metformin -f/u with PCP -BG at 177 today (05/20/18).  -I encouraged her to control her DM as this can contribute to her neuropathy.   9. Memory loss  -She noticed some mild memory loss since she completed chemo. -will screen  her for the REMEMBER clinical study, she met our research nurse     PLAN:  -Continue Letrozole  -Mammogram in 06/2018  -Lab and f/u in 6 months, or sooner if she participates the REMEMBER study  -Gave 6 months for handicap sticker application today     No problem-specific Assessment & Plan notes found for this encounter.   Orders Placed This Encounter  Procedures  . MM DIAG BREAST TOMO BILATERAL    Standing Status:   Future    Standing Expiration Date:   05/21/2019    Order Specific Question:   Reason for Exam (SYMPTOM  OR DIAGNOSIS REQUIRED)    Answer:   screening    Order Specific Question:   Preferred imaging location?    Answer:   [facility]   All questions were answered. The patient knows to call the clinic with any problems, questions or concerns. No barriers to learning was detected. I spent 20 minutes counseling the patient face to face. The total time spent in the appointment was 25 minutes and more than 50% was on counseling and review of test results     Truitt Merle, MD 05/20/2018   I, Joslyn Devon, am acting as scribe for Truitt Merle, MD.   I have reviewed the above documentation for accuracy and completeness, and I agree with the above.

## 2018-05-20 ENCOUNTER — Telehealth: Payer: Self-pay | Admitting: Hematology

## 2018-05-20 ENCOUNTER — Encounter: Payer: Self-pay | Admitting: *Deleted

## 2018-05-20 ENCOUNTER — Inpatient Hospital Stay: Payer: BC Managed Care – PPO

## 2018-05-20 ENCOUNTER — Encounter: Payer: Self-pay | Admitting: Hematology

## 2018-05-20 ENCOUNTER — Inpatient Hospital Stay: Payer: BC Managed Care – PPO | Attending: Hematology | Admitting: Hematology

## 2018-05-20 VITALS — BP 142/86 | HR 105 | Resp 20 | Ht 63.0 in | Wt 199.9 lb

## 2018-05-20 DIAGNOSIS — Z170 Estrogen receptor positive status [ER+]: Secondary | ICD-10-CM | POA: Insufficient documentation

## 2018-05-20 DIAGNOSIS — Z79811 Long term (current) use of aromatase inhibitors: Secondary | ICD-10-CM | POA: Insufficient documentation

## 2018-05-20 DIAGNOSIS — G620 Drug-induced polyneuropathy: Secondary | ICD-10-CM | POA: Insufficient documentation

## 2018-05-20 DIAGNOSIS — T451X5A Adverse effect of antineoplastic and immunosuppressive drugs, initial encounter: Secondary | ICD-10-CM

## 2018-05-20 DIAGNOSIS — D6481 Anemia due to antineoplastic chemotherapy: Secondary | ICD-10-CM

## 2018-05-20 DIAGNOSIS — M8580 Other specified disorders of bone density and structure, unspecified site: Secondary | ICD-10-CM | POA: Diagnosis not present

## 2018-05-20 DIAGNOSIS — C50212 Malignant neoplasm of upper-inner quadrant of left female breast: Secondary | ICD-10-CM | POA: Diagnosis not present

## 2018-05-20 DIAGNOSIS — E119 Type 2 diabetes mellitus without complications: Secondary | ICD-10-CM | POA: Diagnosis not present

## 2018-05-20 LAB — COMPREHENSIVE METABOLIC PANEL
ALT: 24 U/L (ref 0–44)
ANION GAP: 10 (ref 5–15)
AST: 21 U/L (ref 15–41)
Albumin: 3.9 g/dL (ref 3.5–5.0)
Alkaline Phosphatase: 68 U/L (ref 38–126)
BUN: 13 mg/dL (ref 8–23)
CHLORIDE: 104 mmol/L (ref 98–111)
CO2: 27 mmol/L (ref 22–32)
Calcium: 9.5 mg/dL (ref 8.9–10.3)
Creatinine, Ser: 0.86 mg/dL (ref 0.44–1.00)
Glucose, Bld: 177 mg/dL — ABNORMAL HIGH (ref 70–99)
Potassium: 4 mmol/L (ref 3.5–5.1)
SODIUM: 141 mmol/L (ref 135–145)
Total Bilirubin: 0.6 mg/dL (ref 0.3–1.2)
Total Protein: 7.5 g/dL (ref 6.5–8.1)

## 2018-05-20 LAB — CBC WITH DIFFERENTIAL/PLATELET
Abs Immature Granulocytes: 0.01 10*3/uL (ref 0.00–0.07)
BASOS ABS: 0 10*3/uL (ref 0.0–0.1)
Basophils Relative: 1 %
EOS PCT: 3 %
Eosinophils Absolute: 0.1 10*3/uL (ref 0.0–0.5)
HEMATOCRIT: 39 % (ref 36.0–46.0)
Hemoglobin: 12.1 g/dL (ref 12.0–15.0)
IMMATURE GRANULOCYTES: 0 %
LYMPHS ABS: 1.2 10*3/uL (ref 0.7–4.0)
Lymphocytes Relative: 28 %
MCH: 25.5 pg — ABNORMAL LOW (ref 26.0–34.0)
MCHC: 31 g/dL (ref 30.0–36.0)
MCV: 82.1 fL (ref 80.0–100.0)
Monocytes Absolute: 0.6 10*3/uL (ref 0.1–1.0)
Monocytes Relative: 12 %
NEUTROS PCT: 56 %
NRBC: 0 % (ref 0.0–0.2)
Neutro Abs: 2.5 10*3/uL (ref 1.7–7.7)
PLATELETS: 249 10*3/uL (ref 150–400)
RBC: 4.75 MIL/uL (ref 3.87–5.11)
RDW: 17.6 % — AB (ref 11.5–15.5)
WBC: 4.4 10*3/uL (ref 4.0–10.5)

## 2018-05-20 MED ORDER — LETROZOLE 2.5 MG PO TABS: 2.5000 mg | ORAL_TABLET | Freq: Every day | ORAL | 3 refills | Status: DC

## 2018-05-20 NOTE — Telephone Encounter (Signed)
Scheduled appt 02/21 los.  Patient aware of appt date and times.

## 2018-05-20 NOTE — Research (Signed)
WF [id number]: A Phase 3 Randomized Placebo Controlled Clinical Trial of Donepezil inChemotherapy Exposed Breast Cancer Survivors with Cognitive Impairment.  Dr. Burr Medico referred patient for the above study. Met with patient for 15 minutes in exam room to provide some information about the study.Explained that participation is completely voluntary and the study isfor patients with memory loss following chemotherapy. Patient states it has been over a year since she completed chemotherapy and she complains of ongoing difficulty with her memory. Asked patient permission to ask her some eligibility screening questions and she agreed. Asked patient Eligibility Screening Part 1, memory question, "How would you rate the change in your ability to think including remembering, organizing your thoughts and speaking since you were first diagnosed with cancer?" and she chose "mildlyworse" as her answer. I then administered the Eligibility Screening part2 with her permission (the [employer] Learning test- revised). She scored6, which makes patient eligible to continue screening.  Provided patient with consent and authorization forms to take home and review.Informed patient this study randomizes patients to take either the study drug,donezepil or placebo, for total of 6 months. Neither patient or study team, including Dr. Burr Medico, will know which drug she is taking. Neurocognitive testingand questionnaires will be administered at baseline, 12 weeks, 24 weeks and 36weeks. ECG is done at baseline and several more times during the study as outlined in the consent form. Asked patient to read the consent form which outlines the study and write down any questions. Research nurse will review her eligibility for the study and we plan to contact her in about 2 weeks. Patient agreed. Encouraged patient to call sooner if shehas any questions.  Gave patient my business card and a Archivist.  Thanked patient very much for her time today and willingness to consider this study.  Foye Spurling, BSN, RN Clinical Research Nurse 05/20/2018 4:47 PM

## 2018-05-26 ENCOUNTER — Other Ambulatory Visit: Payer: Self-pay | Admitting: Hematology

## 2018-05-26 DIAGNOSIS — G620 Drug-induced polyneuropathy: Secondary | ICD-10-CM

## 2018-05-26 DIAGNOSIS — T451X5A Adverse effect of antineoplastic and immunosuppressive drugs, initial encounter: Principal | ICD-10-CM

## 2018-05-27 ENCOUNTER — Other Ambulatory Visit: Payer: Self-pay | Admitting: Hematology

## 2018-05-27 MED ORDER — TRAMADOL HCL 50 MG PO TABS: 50.0000 mg | ORAL_TABLET | Freq: Two times a day (BID) | ORAL | 0 refills | Status: DC | PRN

## 2018-05-27 MED ORDER — DULOXETINE HCL 60 MG PO CPEP: 60.0000 mg | ORAL_CAPSULE | Freq: Every day | ORAL | 2 refills | Status: DC

## 2018-06-02 ENCOUNTER — Telehealth: Payer: Self-pay | Admitting: *Deleted

## 2018-06-02 NOTE — Telephone Encounter (Signed)
Called patient to follow up on the [place] Remember Study.  LVM for patient that I was calling to see if she has had a chance to review the study and if she has any questions.  Requested patient call research nurse back and left my number. Informed patient I will call her again in about one week.  Thanked patient for her time.  Foye Spurling, BSN, RN Clinical Research Nurse 06/02/2018 3:14 PM

## 2018-07-08 ENCOUNTER — Other Ambulatory Visit: Payer: Self-pay | Admitting: Hematology

## 2018-07-08 ENCOUNTER — Telehealth: Payer: Self-pay

## 2018-07-08 ENCOUNTER — Telehealth: Payer: Self-pay | Admitting: Hematology

## 2018-07-08 MED ORDER — DULOXETINE HCL 20 MG PO CPEP: 20.0000 mg | ORAL_CAPSULE | Freq: Every day | ORAL | 3 refills | Status: DC

## 2018-07-08 MED ORDER — PROCHLORPERAZINE MALEATE 5 MG PO TABS: 5.0000 mg | ORAL_TABLET | Freq: Four times a day (QID) | ORAL | 0 refills | Status: DC | PRN

## 2018-07-08 NOTE — Telephone Encounter (Signed)
Patient calls stating she has been experiencing nausea and lightheadedness this week, no vomiting, no fever, no cough, has tried to wean herself off Cymbalta, has not taken in 5 days.  She is not sure if she is not drinking enough, reports only having liquids with meals and some in the evenings.  Does not routinely check her blood sugar.  Suggested she needs to follow up with PCP but she would like Dr. Ernestina Penna opinion.  Explained I will speak with her an call her back.  [phone]  On Dr. Ernestina Penna desk for review.

## 2018-07-08 NOTE — Telephone Encounter (Signed)
I called pt back, regarding her nausea, weakness and dizziness. I think it could be from cymbalta withdraw. She was on 60mg  daily, and started take 1 every 2-3 days lately. I recommend her to take 40mg  daily for a week, then 20mg  daily for a week, then 20mg  every other day for 2 weeks then stop, and I called in 20mg  cap #30 for this. I also called in some compazine for her nausea. She appreciated the call.   Truitt Merle  07/08/2018

## 2018-08-11 ENCOUNTER — Ambulatory Visit
Admission: RE | Admit: 2018-08-11 | Discharge: 2018-08-11 | Disposition: A | Discharge status: Home / Self Care | Payer: BC Managed Care – PPO | Source: Ambulatory Visit | Attending: Hematology | Admitting: Hematology

## 2018-08-11 ENCOUNTER — Other Ambulatory Visit: Payer: Self-pay

## 2018-08-11 DIAGNOSIS — C50212 Malignant neoplasm of upper-inner quadrant of left female breast: Secondary | ICD-10-CM

## 2018-08-11 DIAGNOSIS — Z170 Estrogen receptor positive status [ER+]: Secondary | ICD-10-CM

## 2018-08-11 HISTORY — DX: Malignant neoplasm of unspecified site of unspecified female breast: C50.919

## 2018-08-17 ENCOUNTER — Ambulatory Visit: Payer: BC Managed Care – PPO

## 2018-09-09 DIAGNOSIS — R1900 Intra-abdominal and pelvic swelling, mass and lump, unspecified site: Secondary | ICD-10-CM | POA: Insufficient documentation

## 2018-09-26 ENCOUNTER — Telehealth: Payer: Self-pay

## 2018-09-26 ENCOUNTER — Telehealth: Payer: Self-pay | Admitting: Hematology

## 2018-09-26 NOTE — Telephone Encounter (Signed)
Patient calls stating Dr. Burr Medico had weaned her off of Duloxetine, since it is out of her system, she states her neuropathy is horrible.  She is requesting Dr. Burr Medico prescribe Gabapentin the highest dose she can take for this.  Message given to Dr. Burr Medico for review.

## 2018-09-26 NOTE — Telephone Encounter (Signed)
I called pt back and reviewed her previous response on gabapentin and Cymbalta to her neuropathy. She did not response well to gabapentin, and I switched her to Cymbalta.  She had some dizziness when she got out of Cymbalta, Cymbalta was gradually weaned off.  I recommend her to try low-dose Cymbalta for her worsening neuropathy, she will start at 20 mg daily, and go up to 40 mg daily if needed in a few weeks.  She will let us know if Cymbalta does not control her neuropathy well.  Truitt Merle  09/26/2018

## 2018-10-05 ENCOUNTER — Other Ambulatory Visit: Payer: Self-pay | Admitting: General Surgery

## 2018-10-05 DIAGNOSIS — K432 Incisional hernia without obstruction or gangrene: Secondary | ICD-10-CM

## 2018-10-12 ENCOUNTER — Telehealth: Payer: Self-pay

## 2018-10-12 NOTE — Telephone Encounter (Signed)
Patient calls stating she started back on Cymbalta 20 mg, however has been taking 20 mg bid.  She wants to make that is okay with Dr. Burr Medico.    Also she will need a new script sent into her pharmacy reflecting this change #60.  [phone]    Message given to Dr. Burr Medico for review

## 2018-10-13 ENCOUNTER — Telehealth: Payer: Self-pay

## 2018-10-13 ENCOUNTER — Other Ambulatory Visit: Payer: Self-pay

## 2018-10-13 ENCOUNTER — Other Ambulatory Visit: Payer: Self-pay | Admitting: General Surgery

## 2018-10-13 DIAGNOSIS — T451X5A Adverse effect of antineoplastic and immunosuppressive drugs, initial encounter: Secondary | ICD-10-CM

## 2018-10-13 DIAGNOSIS — G620 Drug-induced polyneuropathy: Secondary | ICD-10-CM

## 2018-10-13 MED ORDER — DULOXETINE HCL 20 MG PO CPEP: 20.0000 mg | ORAL_CAPSULE | Freq: Two times a day (BID) | ORAL | 3 refills | Status: DC

## 2018-10-13 NOTE — Telephone Encounter (Signed)
Left voice message for patient regarding Cymbalta.  Per Dr. Burr Medico okay to take 40 mg daily, however be cautious may cause dizziness.  Sending in a script for 20 mg bid #60 to her pharmacy.

## 2018-10-17 ENCOUNTER — Ambulatory Visit
Admission: RE | Admit: 2018-10-17 | Discharge: 2018-10-17 | Disposition: A | Discharge status: Home / Self Care | Payer: BC Managed Care – PPO | Source: Ambulatory Visit | Attending: General Surgery | Admitting: General Surgery

## 2018-10-17 ENCOUNTER — Other Ambulatory Visit: Payer: Self-pay

## 2018-10-17 DIAGNOSIS — K432 Incisional hernia without obstruction or gangrene: Secondary | ICD-10-CM

## 2018-10-17 MED ORDER — IOPAMIDOL (ISOVUE-300) INJECTION 61%
100.0000 mL | Freq: Once | INTRAVENOUS | Status: AC | PRN
  Administered 2018-10-17: 100 mL via INTRAVENOUS

## 2018-11-09 ENCOUNTER — Telehealth: Payer: Self-pay

## 2018-11-09 NOTE — Telephone Encounter (Signed)
Faxed signed order to [facility] [place], sent to [facility] for scan to chart.

## 2018-11-17 NOTE — Progress Notes (Signed)
Taylor Walsh   Telephone:([phone] Fax:([phone]) [phone]   Clinic Follow up Note   Patient Care Team: Marton Redwood, MD as PCP - General (Internal Medicine) Melrose Nakayama, MD as Consulting Physician (Cardiothoracic Surgery) Fanny Skates, MD as Consulting Physician (General Surgery) Truitt Merle, MD as Consulting Physician (Hematology) Delice Bison, Charlestine Massed, NP as Nurse Practitioner (Hematology and Oncology) Eppie Gibson, MD as Attending Physician (Radiation Oncology)  Date of Service:  11/19/2018  CHIEF COMPLAINT: F/u left breast cancer  SUMMARY OF ONCOLOGIC HISTORY: Oncology History Overview Note  Cancer Staging Breast cancer of upper-inner quadrant of left female breast [facility]) Staging form: Breast, AJCC 8th Edition - Clinical stage from 07/10/2016: Stage 0 (cTis (DCIS), cN0, cM0, G3, ER: Positive, PR: Positive, HER2: Not Assessed) - Signed by Truitt Merle, MD on 07/28/2016 - Pathologic stage from 10/26/2016: Stage IA (pT1a, pN1a, cM0, G2, ER: Positive, PR: Positive, HER2: Negative) - Signed by Truitt Merle, MD on 11/11/2016     Breast cancer of upper-inner quadrant of left female breast ([place])  07/10/2016 Initial Biopsy   Diagnosis 1. 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 2. 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   07/10/2016 Receptors her2   ER 95%, PR 80-90%+,  both strong staining, HER2 - (1+), Ki67 80-90%   07/22/2016 Initial Diagnosis   Ductal carcinoma in situ (DCIS) of left breast   07/27/2016 Imaging   Breast MRI w wo contrast showed  1. Extensive mass and non mass enhancement along the lower inner aspect the left breast, lower outer quadrant, spanning 10.2 cm from anterior to posterior, including both biopsied lesions, as  detailed above. This is all consistent with DCIS. 2. No other abnormal enhancement in the left breast. 3. No abnormal or enlarged axillary lymph nodes. 4. 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 The Hereditary Gene Panel offered by [facility] includes sequencing and/or deletion duplication testing of the following 46 genes: APC, ATM, AXIN2, BARD1, BMPR1A, BRCA1, BRCA2, BRIP1, CDH1, CDKN2A (p14ARF), CDKN2A (p16INK4a), CHEK2, CTNNA1, DICER1, EPCAM (Deletion/duplication testing only), GREM1 (promoter region deletion/duplication testing only), KIT, MEN1, MLH1, MSH2, MSH3, MSH6, MUTYH, NBN, NF1, NHTL1, PALB2, PDGFRA, PMS2, POLD1, POLE, PTEN, RAD50, RAD51C, RAD51D, SDHB, SDHC, SDHD, SMAD4, SMARCA4. STK11, TP53, TSC1, TSC2, and VHL.  The following gene was evaluated for sequence changes only: SDHA and HOXB13 c.251G>A variant only.  The report date is Aug 09, 2016. 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  The change in variant classification was made as a result of re-review of the evidence in light of new variant interpretation guidelines and /or new information. The updated report date is January 26, 2018.    10/13/2016 Surgery   LEFT TOTAL MASTECTOMY WITH LEFT AXILLARY SENTINEL LYMPH NODE BIOPSY By Dr. Ninfa Linden   10/13/2016 Pathology Results   Surgery Diagnosis 10/13/16  1. Lymph node, sentinel, biopsy, Left Axillary - METASTATIC CARCINOMA IN ONE LYMPH NODE (1/1). 2. Lymph node, sentinel, biopsy, Left - METASTATIC CARCINOMA IN ONE LYMPH NODE (1/1). 3. Breast, radical mastectomy (including lymph nodes), Left - INVASIVE DUCTAL CARCINOMA, 0.3 CM. - LYMPHOVASCULAR INVOLVEMENT BY CARCINOMA. - HIGH GRADE DUCTAL CARCINOMA IN SITU WITH CALCIFICATIONS AND NECROSIS, 5.5 CM. - DCIS FOCALLY  0.1 CM FROM CAUTERIZED MEDIAL MARGIN. - SEE ONCOLOGY TABLE AND COMMENT. 4. Skin , Left additional  mastectomy flap - BENIGN FIBROADIPOSE TISSUE. - NO EVIDENCE OF MALIGNANCY.   10/30/2016 Miscellaneous   MammaPrint 10/31/26 Result:  High Risk,Luminal type B MPI: -0.368 Average 10-year risk of recurrence untreated: 29%   11/18/2016 Imaging   CT CAP W Contrast 11/18/16 IMPRESSION: 1. Skin thickening and interstitial changes involving the left breast, likely related to radiation. Surgical changes are also noted with a tissue expander in place. Probable postop fluid collection in the upper outer quadrant breast. No enlarged supraclavicular or axillary lymph nodes. 2. No CT findings suggesting metastatic disease involving the chest, abdomen or pelvis or bony structures. 3. Surgical changes from a left upper lobe wedge resection. No findings for recurrent tumor.   11/23/2016 Imaging   Bone Scan Whole body 11/23/16 IMPRESSION: No definite scintigraphic evidence of osseous metastatic disease as above.   11/25/2016 - 04/07/2017 Chemotherapy   Adjuvant chemotherapy AC every 2 weeks for 4 cycles, 11/25/16-01/05/17, followed by Taxol weekly for 12 cycles starting 01/20/17    04/28/2017 - 06/11/2017 Radiation Therapy    1. Left Chest Wall / 50.4 Gy in 28 fractions 2. Left SCV and PAB / 50.4 Gy in 28 fractions  3. Left Chest Wall Boost / 10 Gy in 5 fractions    05/2017 -  Anti-estrogen oral therapy   Anastrozole initially, then changed to Letrozole in April 2019      CURRENT THERAPY:  Started Anastrozole in 05/2017. Switched to Letrozole in 06/2017  INTERVAL HISTORY:  Taylor Walsh is here for a follow up left breast cancer. She was last seen by me 6 months ago. She presents to the clinic alone. She notes she is doing well. She notes she still has neuropathy and cramping of her hands and feet. She notes she does have mild deficit in hand dexterity. She also notes right flank hernia after breast reconstruction. She also note arthritis in her right knee. She was given injection which helps. She  notes her memory issue from chemo has improved and overall back to baseline.  She notes Dr. Brigitte Pulse increased her  Metformin to 1000 BID.   REVIEW OF SYSTEMS:   Constitutional: Denies fevers, chills or abnormal weight loss Eyes: Denies blurriness of vision Ears, nose, mouth, throat, and face: Denies mucositis or sore throat Respiratory: Denies cough, dyspnea or wheezes Cardiovascular: Denies palpitation, chest discomfort or lower extremity swelling Gastrointestinal:  Denies nausea, heartburn or change in bowel habits (+) right flank hernia  Skin: Denies abnormal skin rashes MSK: (+) arthritis in her right knee Lymphatics: Denies new lymphadenopathy or easy bruising Neurological: (+) Neuropathy in hands and feet with feet cramping  Behavioral/Psych: Mood is stable, no new changes  All other systems were reviewed with the patient and are negative.  MEDICAL HISTORY:  Past Medical History:  Diagnosis Date  . Anxiety   . Arthritis   . Breast cancer ([place])   . Cancer ([place]) 10/08/2016   left breast cancer  . Diabetes mellitus without complication ([place])   . Dyslipidemia   . Family history of breast cancer   . Family history of ovarian cancer   . Family history of prostate cancer   . GERD (gastroesophageal reflux disease)    nexium if needed  . Heart murmur   . History of radiation therapy 04/28/17- 06/11/17   Left Chest wall/ 50.4 Gy in 28 fractions. Left SCV/ 50.4 Gy in 28 fractions. Left  Chest wall boost/ 10 Gy in 5 fractions.   . Hypertension   . Hypothyroid   . Osteopenia   . Pneumonia 10/08/2016   June 2013    SURGICAL HISTORY: Past Surgical History:  Procedure Laterality Date  . ABDOMINAL HYSTERECTOMY    . BREAST RECONSTRUCTION WITH PLACEMENT OF TISSUE EXPANDER AND FLEX HD (ACELLULAR HYDRATED DERMIS) Left 10/13/2016   Procedure: LEFT BREAST RECONSTRUCTION WITH PLACEMENT OF TISSUE EXPANDER AND ALLODERM;  Surgeon: Irene Limbo, MD;  Location: [facility];  Service: Plastics;   Laterality: Left;  . FOOT SURGERY     bi-lat/ repaired hammer toes  . MASTECTOMY Left 10/13/2016  . MASTECTOMY W/ SENTINEL NODE BIOPSY Left 10/13/2016  . MASTECTOMY W/ SENTINEL NODE BIOPSY Left 10/13/2016   Procedure: LEFT TOTAL MASTECTOMY WITH LEFT AXILLARY SENTINEL LYMPH NODE BIOPSY;  Surgeon: Coralie Keens, MD;  Location: [facility];  Service: General;  Laterality: Left;  . PORTACATH PLACEMENT Right 11/24/2016   Procedure: INSERTION PORT-A-CATH;  Surgeon: Fanny Skates, MD;  Location: [facility];  Service: General;  Laterality: Right;  . TUBAL LIGATION    . VIDEO ASSISTED THORACOSCOPY (VATS)/WEDGE RESECTION Left 02/25/2015   Procedure: VIDEO ASSISTED THORACOSCOPY (VATS)/ LUL WEDGE RESECTION with ON Q placement.;  Surgeon: Melrose Nakayama, MD;  Location: [facility];  Service: Thoracic;  Laterality: Left;    I have reviewed the social history and family history with the patient and they are unchanged from previous note.  ALLERGIES:  is allergic to vicodin [hydrocodone-acetaminophen].  MEDICATIONS:  Current Outpatient Medications  Medication Sig Dispense Refill  . aspirin 81 MG tablet Take 81 mg by mouth daily.    Marland Kitchen atorvastatin (LIPITOR) 40 MG tablet Take 40 mg by mouth daily.    . calcium-vitamin D 250-100 MG-UNIT tablet Take 2 tablets by mouth daily.     . DULoxetine (CYMBALTA) 20 MG capsule Take 1 capsule (20 mg total) by mouth 2 (two) times daily. 60 capsule 3  . ergocalciferol (VITAMIN D2) 50000 UNITS capsule Take 50,000 Units by mouth every Friday.     Marland Kitchen ibuprofen (ADVIL,MOTRIN) 600 MG tablet Take 1 tablet (600 mg total) by mouth every 8 (eight) hours as needed. Take with food to protect stomach. 36 tablet 0  . letrozole (FEMARA) 2.5 MG tablet Take 1 tablet (2.5 mg total) by mouth daily. 90 tablet 3  . levothyroxine (SYNTHROID, LEVOTHROID) 75 MCG tablet   6  . losartan (COZAAR) 50 MG tablet TAKE 1 TABLET BY MOUTH ONCE DAILY FOR 90 DAYS  1  . metFORMIN (GLUCOPHAGE)  500 MG tablet TAKE 1 TABLET BY MOUTH TWICE DAILY WITH A MEAL (Patient taking differently: 1,000 mg 2 (two) times daily with a meal. TAKE 1 TABLET BY MOUTH TWICE DAILY WITH A MEAL) 60 tablet 2  . prochlorperazine (COMPAZINE) 5 MG tablet Take 1 tablet (5 mg total) by mouth every 6 (six) hours as needed for nausea or vomiting. 30 tablet 0  . traMADol (ULTRAM) 50 MG tablet Take 1 tablet (50 mg total) by mouth every 12 (twelve) hours as needed for moderate pain or severe pain. 20 tablet 0   No current facility-administered medications for this visit.     PHYSICAL EXAMINATION: ECOG PERFORMANCE STATUS: 0 - Asymptomatic  Vitals:   11/18/18 1430  BP: 137/75  Pulse: 83  Resp: 17  Temp: 98.5 F (36.9 C)  SpO2: 100%   Filed Weights   11/18/18 1430  Weight: 193 lb 8 oz (87.8 kg)    GENERAL:alert,  no distress and comfortable SKIN: skin color, texture, turgor are normal, no rashes or significant lesions EYES: normal, Conjunctiva are pink and non-injected, sclera clear  NECK: supple, thyroid normal size, non-tender, without nodularity LYMPH:  no palpable lymphadenopathy in the cervical, axillary  LUNGS: clear to auscultation and percussion with normal breathing effort HEART: regular rate & rhythm and no murmurs and no lower extremity edema ABDOMEN:abdomen soft, non-tender and normal bowel sounds Musculoskeletal:no cyanosis of digits and no clubbing  NEURO: alert & oriented x 3 with fluent speech, no focal motor/sensory deficits BREAST: S/p left mastectomy and right breast reduction: surgical incision healed well. No palpable mass, nodules or adenopathy bilaterally. Breast exam benign.   LABORATORY DATA:  I have reviewed the data as listed CBC Latest Ref Rng & Units 11/18/2018 05/20/2018 11/10/2017  WBC 4.0 - 10.5 K/uL 4.1 4.4 4.0  Hemoglobin 12.0 - 15.0 g/dL 12.5 12.1 12.6  Hematocrit 36.0 - 46.0 % 39.8 39.0 39.2  Platelets 150 - 400 K/uL 248 249 219     CMP Latest Ref Rng & Units  11/18/2018 05/20/2018 11/10/2017  Glucose 70 - 99 mg/dL 100(H) 177(H) 181(H)  BUN 8 - 23 mg/dL _0 Creatinine 0.44 - 1.00 mg/dL 0.74 0.86 0.82  Sodium 135 - 145 mmol/L 139 141 140  Potassium 3.5 - 5.1 mmol/L 4.1 4.0 3.9  Chloride 98 - 111 mmol/L 103 104 102  CO2 22 - 32 mmol/L _1 Calcium 8.9 - 10.3 mg/dL 9.2 9.5 9.1  Total Protein 6.5 - 8.1 g/dL 7.4 7.5 7.4  Total Bilirubin 0.3 - 1.2 mg/dL 0.7 0.6 0.8  Alkaline Phos 38 - 126 U/L 63 68 68  AST 15 - 41 U/L _2 ALT 0 - 44 U/L 42 24 25      RADIOGRAPHIC STUDIES: I have personally reviewed the radiological images as listed and agreed with the findings in the report. No results found.   ASSESSMENT & PLAN:  Taylor Walsh is a 63 y.o. female with   1. 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 She is s/p left mastectomy with reconstruction, adjuvant chemo AC-T and radiation.  -She started anti-estrogen therapy with Anastrozole in 05/2017 but due to poor tolerance she switched to Letrozole in 06/2017. She is tolerating well overall  -She underwent right breast reduction on 08/31/18 for symmetry. She feels lower abdominal hernia where her flap was taken from after surgery,. As seen on 09/2018 CT scan.  -She is clinically doing well. Lab reviewed, her CBC and CMP are within normal limits. Her physical exam and her 07/2018 mammogram were unremarkable. There is no clinical concern for recurrence. -Continue Surveillance, next mammogram in 06/2018  -Continue Letrozole. I encouraged her to watch her diet, cholesterol and be physically active.  -F/u in 6 months  2. Arthritis -She experiences mild arthritis in LE, more specific to her right knee -She is pretty active -Will monitor while onLetrozole  3. Osteopenia  -Last bone density scan ordered by Dr Brigitte Pulse in 2016  -DEXA from 07/28/17 shows Osteopenia  -She will f/u with Dr. Brigitte Pulse  4. Smoking cessation: The patient quit smoking in 07/2016  5. 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 Otherwise negative.   6. History of pulmonary amyloidosis -She was found to have a lung nodule in  October 2016, which was surgically removed, and showed pulmonary amyloidosis -Previous lab showed no evidence of systemic amyloidosis -Continue monitoring -She has not been abel to see Dr. [place] Bells due to COVID-19.   7.Chemo induced peripheral Neuropathy, G2 -I previously encouraged her to take vitamin B12 or B6. -Shetriedgabapentin, not very effective. I have switched her to Cymbalta, she is on 60 mg daily, which helps.  -Ipreviouslyprescribed Tramadol for her use for severe pain occasionallyin 08/2017, has not needed refill. -Her neuropathy remains moderate and effecting her hand dexterity and causes feet pain. She doe snot work far or often.  -I discussed this can still improve in 2-3 years. There is a chance some residual neuropathy may not fully resolve.  -I refilled her 6 months handicap sticker.    8.DM -She is on metformin -f/u with PCP -I encouraged her to control her DM as this can contribute to her neuropathy. -Dr. Brigitte Pulse increased her Metformin to 1030m BID. Her BG has improved.  -BG at 100 today (11/18/18).    9. Memory loss  -She noticed some mild memory loss since she completed chemo. -will screen her for the REMEMBER clinical study, she met our research nurse   -Her memory is overall back to baseline now.    PLAN: -Continue Letrozole  -Mammogram in 07/2019 -Lab and f/u 6 months  -Gave 6 months for handicap sticker application today    No problem-specific Assessment & Plan notes found for this encounter.   No orders of the defined types were placed in this encounter.  All questions were answered. The patient knows to call the clinic with any  problems, questions or concerns. No barriers to learning was detected. I spent 15 minutes counseling the patient face to face. The total time spent in the appointment was 20 minutes and more than 50% was on counseling and review of test results     YTruitt Merle MD 11/19/2018   I, AJoslyn Devon am acting as scribe for YTruitt Merle MD.   I have reviewed the above documentation for accuracy and completeness, and I agree with the above.

## 2018-11-18 ENCOUNTER — Inpatient Hospital Stay (HOSPITAL_BASED_OUTPATIENT_CLINIC_OR_DEPARTMENT_OTHER): Payer: BC Managed Care – PPO | Admitting: Hematology

## 2018-11-18 ENCOUNTER — Other Ambulatory Visit: Payer: Self-pay

## 2018-11-18 ENCOUNTER — Inpatient Hospital Stay: Payer: BC Managed Care – PPO | Attending: Hematology

## 2018-11-18 VITALS — BP 137/75 | HR 83 | Temp 98.5°F | Resp 17 | Ht 63.0 in | Wt 193.5 lb

## 2018-11-18 DIAGNOSIS — C773 Secondary and unspecified malignant neoplasm of axilla and upper limb lymph nodes: Secondary | ICD-10-CM | POA: Insufficient documentation

## 2018-11-18 DIAGNOSIS — R413 Other amnesia: Secondary | ICD-10-CM | POA: Insufficient documentation

## 2018-11-18 DIAGNOSIS — E119 Type 2 diabetes mellitus without complications: Secondary | ICD-10-CM | POA: Diagnosis not present

## 2018-11-18 DIAGNOSIS — M8580 Other specified disorders of bone density and structure, unspecified site: Secondary | ICD-10-CM | POA: Insufficient documentation

## 2018-11-18 DIAGNOSIS — C50212 Malignant neoplasm of upper-inner quadrant of left female breast: Secondary | ICD-10-CM | POA: Insufficient documentation

## 2018-11-18 DIAGNOSIS — Z170 Estrogen receptor positive status [ER+]: Secondary | ICD-10-CM | POA: Insufficient documentation

## 2018-11-18 DIAGNOSIS — M1711 Unilateral primary osteoarthritis, right knee: Secondary | ICD-10-CM | POA: Diagnosis not present

## 2018-11-18 DIAGNOSIS — Z79811 Long term (current) use of aromatase inhibitors: Secondary | ICD-10-CM | POA: Insufficient documentation

## 2018-11-18 DIAGNOSIS — G620 Drug-induced polyneuropathy: Secondary | ICD-10-CM | POA: Insufficient documentation

## 2018-11-18 LAB — COMPREHENSIVE METABOLIC PANEL
ALT: 42 U/L (ref 0–44)
AST: 29 U/L (ref 15–41)
Albumin: 4.2 g/dL (ref 3.5–5.0)
Alkaline Phosphatase: 63 U/L (ref 38–126)
Anion gap: 10 (ref 5–15)
BUN: 15 mg/dL (ref 8–23)
CO2: 26 mmol/L (ref 22–32)
Calcium: 9.2 mg/dL (ref 8.9–10.3)
Chloride: 103 mmol/L (ref 98–111)
Creatinine, Ser: 0.74 mg/dL (ref 0.44–1.00)
GFR calc Af Amer: >60 mL/min (ref >60)
GFR calc non Af Amer: >60 mL/min (ref >60)
Glucose, Bld: 100 mg/dL — ABNORMAL HIGH (ref 70–99)
Potassium: 4.1 mmol/L (ref 3.5–5.1)
Sodium: 139 mmol/L (ref 135–145)
Total Bilirubin: 0.7 mg/dL (ref 0.3–1.2)
Total Protein: 7.4 g/dL (ref 6.5–8.1)

## 2018-11-18 LAB — CBC WITH DIFFERENTIAL/PLATELET
Abs Immature Granulocytes: 0.01 10*3/uL (ref 0.00–0.07)
Basophils Absolute: 0 10*3/uL (ref 0.0–0.1)
Basophils Relative: 0 %
Eosinophils Absolute: 0.1 10*3/uL (ref 0.0–0.5)
Eosinophils Relative: 2 %
HCT: 39.8 % (ref 36.0–46.0)
Hemoglobin: 12.5 g/dL (ref 12.0–15.0)
Immature Granulocytes: 0 %
Lymphocytes Relative: 35 %
Lymphs Abs: 1.4 10*3/uL (ref 0.7–4.0)
MCH: 25.2 pg — ABNORMAL LOW (ref 26.0–34.0)
MCHC: 31.4 g/dL (ref 30.0–36.0)
MCV: 80.1 fL (ref 80.0–100.0)
Monocytes Absolute: 0.5 10*3/uL (ref 0.1–1.0)
Monocytes Relative: 13 %
Neutro Abs: 2 10*3/uL (ref 1.7–7.7)
Neutrophils Relative %: 50 %
Platelets: 248 10*3/uL (ref 150–400)
RBC: 4.97 MIL/uL (ref 3.87–5.11)
RDW: 16.5 % — ABNORMAL HIGH (ref 11.5–15.5)
WBC: 4.1 10*3/uL (ref 4.0–10.5)
nRBC: 0 % (ref 0.0–0.2)

## 2018-11-19 ENCOUNTER — Encounter: Payer: Self-pay | Admitting: Hematology

## 2018-11-21 ENCOUNTER — Telehealth: Payer: Self-pay | Admitting: Hematology

## 2018-11-21 NOTE — Telephone Encounter (Signed)
Scheduled appt per 8/21 los.  Printed and mailed appt calendar.

## 2019-01-19 HISTORY — PX: ABDOMINAL HERNIA REPAIR: SHX539

## 2019-02-13 ENCOUNTER — Ambulatory Visit: Payer: BC Managed Care – PPO | Admitting: Pulmonary Disease

## 2019-02-13 ENCOUNTER — Encounter: Payer: Self-pay | Admitting: Pulmonary Disease

## 2019-02-13 ENCOUNTER — Other Ambulatory Visit: Payer: Self-pay

## 2019-02-13 ENCOUNTER — Ambulatory Visit (INDEPENDENT_AMBULATORY_CARE_PROVIDER_SITE_OTHER): Payer: BC Managed Care – PPO | Admitting: Pulmonary Disease

## 2019-02-13 ENCOUNTER — Other Ambulatory Visit: Payer: Self-pay | Admitting: Hematology

## 2019-02-13 DIAGNOSIS — R918 Other nonspecific abnormal finding of lung field: Secondary | ICD-10-CM | POA: Diagnosis not present

## 2019-02-13 DIAGNOSIS — G620 Drug-induced polyneuropathy: Secondary | ICD-10-CM

## 2019-02-13 NOTE — Patient Instructions (Signed)
Glad you are doing with regard to your breathing We will schedule you for low-dose screening CTs of the chest Follow-up in 6 months.

## 2019-02-13 NOTE — Progress Notes (Signed)
Taylor Walsh    [id number]    03-Dec-1955  Primary Care Physician:Shaw, Gwyndolyn Saxon, MD  Referring Physician: Marton Redwood, MD [address] [place],  [place] [address]  Chief complaint: Follow-up for pulmonary amyloidosis  HPI: 63 year old with history of diabetes, pulmonary amyloidosis, breast cancer Follow patient of Dr. Lake Bells Pulmonary amyloidosis was diagnosed underwent open lung biopsy in 2016.  This was done for multiloculated lung density which turned out to be amyloidosis Here for routine visit.  States that her breathing is doing well with no issues Breast cancer was diagnosed in 2018 treated with left mastectomy, chemotherapy and radiation.  Pets: No pets Occupation: Retired Optometrist Exposures: No known exposures.  No mold, hot tub, Jacuzzi, no down pillows or comforters Smoking history: 40-pack-year smoker.  Quit in 2018 Travel history: No significant travel history Relevant family history: No significant family history of lung disease  Outpatient Encounter Medications as of 02/13/2019  Medication Sig  . aspirin 81 MG tablet Take 81 mg by mouth daily.  Marland Kitchen atorvastatin (LIPITOR) 40 MG tablet Take 40 mg by mouth daily.  . calcium-vitamin D 250-100 MG-UNIT tablet Take 2 tablets by mouth daily.   . DULoxetine (CYMBALTA) 20 MG capsule Take 1 capsule (20 mg total) by mouth 2 (two) times daily.  . ergocalciferol (VITAMIN D2) 50000 UNITS capsule Take 50,000 Units by mouth every Friday.   Marland Kitchen ibuprofen (ADVIL,MOTRIN) 600 MG tablet Take 1 tablet (600 mg total) by mouth every 8 (eight) hours as needed. Take with food to protect stomach.  Marland Kitchen letrozole (FEMARA) 2.5 MG tablet Take 1 tablet (2.5 mg total) by mouth daily.  Marland Kitchen levothyroxine (SYNTHROID, LEVOTHROID) 75 MCG tablet   . losartan (COZAAR) 50 MG tablet TAKE 1 TABLET BY MOUTH ONCE DAILY FOR 90 DAYS  . metFORMIN (GLUCOPHAGE) 500 MG tablet TAKE 1 TABLET BY MOUTH TWICE DAILY WITH A MEAL (Patient taking differently: 1,000  mg 2 (two) times daily with a meal. TAKE 1 TABLET BY MOUTH TWICE DAILY WITH A MEAL)  . prochlorperazine (COMPAZINE) 5 MG tablet Take 1 tablet (5 mg total) by mouth every 6 (six) hours as needed for nausea or vomiting.  . traMADol (ULTRAM) 50 MG tablet Take 1 tablet (50 mg total) by mouth every 12 (twelve) hours as needed for moderate pain or severe pain.   No facility-administered encounter medications on file as of 02/13/2019.     Allergies as of 02/13/2019 - Review Complete 02/13/2019  Allergen Reaction Noted  . Vicodin [hydrocodone-acetaminophen] Swelling and Other (See Comments) 01/04/2015    Past Medical History:  Diagnosis Date  . Anxiety   . Arthritis   . Breast cancer ([facility])   . Cancer ([place]) 10/08/2016   left breast cancer  . Diabetes mellitus without complication ([facility])   . Dyslipidemia   . Family history of breast cancer   . Family history of ovarian cancer   . Family history of prostate cancer   . GERD (gastroesophageal reflux disease)    nexium if needed  . Heart murmur   . History of radiation therapy 04/28/17- 06/11/17   Left Chest wall/ 50.4 Gy in 28 fractions. Left SCV/ 50.4 Gy in 28 fractions. Left Chest wall boost/ 10 Gy in 5 fractions.   . Hypertension   . Hypothyroid   . Osteopenia   . Pneumonia 10/08/2016   June 2013    Past Surgical History:  Procedure Laterality Date  . ABDOMINAL HERNIA REPAIR  01/19/2019  .  ABDOMINAL HYSTERECTOMY    . BREAST RECONSTRUCTION WITH PLACEMENT OF TISSUE EXPANDER AND FLEX HD (ACELLULAR HYDRATED DERMIS) Left 10/13/2016   Procedure: LEFT BREAST RECONSTRUCTION WITH PLACEMENT OF TISSUE EXPANDER AND ALLODERM;  Surgeon: Irene Limbo, MD;  Location: [facility];  Service: Plastics;  Laterality: Left;  . FOOT SURGERY     bi-lat/ repaired hammer toes  . MASTECTOMY Left 10/13/2016  . MASTECTOMY W/ SENTINEL NODE BIOPSY Left 10/13/2016  . MASTECTOMY W/ SENTINEL NODE BIOPSY Left 10/13/2016   Procedure: LEFT TOTAL MASTECTOMY WITH LEFT  AXILLARY SENTINEL LYMPH NODE BIOPSY;  Surgeon: Coralie Keens, MD;  Location: [facility];  Service: General;  Laterality: Left;  . PORTACATH PLACEMENT Right 11/24/2016   Procedure: INSERTION PORT-A-CATH;  Surgeon: Fanny Skates, MD;  Location: [facility];  Service: General;  Laterality: Right;  . TUBAL LIGATION    . VIDEO ASSISTED THORACOSCOPY (VATS)/WEDGE RESECTION Left 02/25/2015   Procedure: VIDEO ASSISTED THORACOSCOPY (VATS)/ LUL WEDGE RESECTION with ON Q placement.;  Surgeon: Melrose Nakayama, MD;  Location: [facility];  Service: Thoracic;  Laterality: Left;    Family History  Problem Relation Age of Onset  . Cancer Mother        brain, ovarian  . Hypertension Mother   . Stroke Father   . Hypertension Father   . Cancer Sister 35       bilateral breast cancer, 2nd cancer at 48  . Breast cancer Sister   . Cancer Brother        dx with prostate cancer in the 11s  . Cancer Brother        dx in his 46s with prostate cancer  . Brain cancer Maternal Uncle   . Cirrhosis Brother   . Cancer Brother        NOS  . Cancer Paternal Aunt        NOS  . Cancer Cousin        paternal cousin with cancer NOS    Social History   Socioeconomic History  . Marital status: Single    Spouse name: Not on file  . Number of children: 2  . Years of education: Not on file  . Highest education level: Not on file  Occupational History  . Not on file  Social Needs  . Financial resource strain: Not on file  . Food insecurity    Worry: Not on file    Inability: Not on file  . Transportation needs    Medical: Not on file    Non-medical: Not on file  Tobacco Use  . Smoking status: Former Smoker    Packs/day: 1.00    Years: 42.00    Pack years: 42.00    Quit date: 08/27/2016    Years since quitting: 2.4  . Smokeless tobacco: Never Used  Substance and Sexual Activity  . Alcohol use: No    Alcohol/week: 0.0 standard drinks    Comment: once or twice yearly.  . Drug use: No  .  Sexual activity: Not on file  Lifestyle  . Physical activity    Days per week: Not on file    Minutes per session: Not on file  . Stress: Not on file  Relationships  . Social Herbalist on phone: Not on file    Gets together: Not on file    Attends religious service: Not on file    Active member of club or organization: Not on file    Attends meetings of clubs  or organizations: Not on file    Relationship status: Not on file  . Intimate partner violence    Fear of current or ex partner: Not on file    Emotionally abused: Not on file    Physically abused: Not on file    Forced sexual activity: Not on file  Other Topics Concern  . Not on file  Social History Narrative  . Not on file    Review of systems: Review of Systems  Constitutional: Negative for fever and chills.  HENT: Negative.   Eyes: Negative for blurred vision.  Respiratory: as per HPI  Cardiovascular: Negative for chest pain and palpitations.  Gastrointestinal: Negative for vomiting, diarrhea, blood per rectum. Genitourinary: Negative for dysuria, urgency, frequency and hematuria.  Musculoskeletal: Negative for myalgias, back pain and joint pain.  Skin: Negative for itching and rash.  Neurological: Negative for dizziness, tremors, focal weakness, seizures and loss of consciousness.  Endo/Heme/Allergies: Negative for environmental allergies.  Psychiatric/Behavioral: Negative for depression, suicidal ideas and hallucinations.  All other systems reviewed and are negative.  Physical Exam: Blood pressure 120/72, pulse 90, temperature (!) 97.3 F (36.3 C), temperature source Temporal, height 5\' 3"  (1.6 m), weight 191 lb 12.8 oz (87 kg), SpO2 99 %. Gen:      No acute distress HEENT:  EOMI, sclera anicteric Neck:     No masses; no thyromegaly Lungs:    Clear to auscultation bilaterally; normal respiratory effort CV:         Regular rate and rhythm; no murmurs Abd:      + bowel sounds; soft, non-tender; no  palpable masses, no distension Ext:    No edema; adequate peripheral perfusion Skin:      Warm and dry; no rash Neuro: alert and oriented x 3 Psych: normal mood and affect  Data Reviewed: Imaging: CT chest 01/14/2015-40 mm lobulated density in the left lung apex.   PET scan 01/22/2015-left upper lobe density with SUV of 1.5.  Hypermetabolic tumor lesion in the lateral upper right chest. CT chest 11/18/2016-postsurgical changes in the left upper lobe. Chest x-ray 10/07/2017-no active cardiopulmonary disease  PFTs:  12/14/2014 FVC 2.50 [98%], FEV1 2.21 [109%], F/F 38, TLC 4.24 [86%], DLCO 18 [78%]  Path Surgical pathology 02/25/2015-fibroelastic scar with amyloid deposition, associated giant cell reaction and osseous metaplasia.  Assessment:  Pulmonary nodule, amyloidosis Pulmonary nodule resected in 2016 with findings of scar tissue and amyloid deposition.  There is no evidence of systemic amyloidosis or more extensive involvement of the lung Review lung imaging on screening CTs of the chest  Ex-smoker We will schedule screening CTs of the chest   Plan/Recommendations: - Refer for screening CTs of the chest  Marshell Garfinkel MD [facility] 02/13/2019, 12:07 PM  CC: Marton Redwood, MD

## 2019-03-01 ENCOUNTER — Telehealth: Payer: Self-pay | Admitting: Pulmonary Disease

## 2019-03-02 NOTE — Telephone Encounter (Signed)
[facility] placed order in Nov for CT to be done in 3 months.  Pt wanting done before end of year.  Is it ok to schedule in December?

## 2019-03-02 NOTE — Telephone Encounter (Signed)
Dr. Vaughan Browner, please advise on this. Thanks!

## 2019-03-02 NOTE — Telephone Encounter (Signed)
We had ordered due to screening CTs of the chest. Ok to be done before year end if we can fit her in.

## 2019-03-03 NOTE — Telephone Encounter (Signed)
Sched CT and gave appt info to pt.  Nothing further needed.

## 2019-03-15 ENCOUNTER — Other Ambulatory Visit (HOSPITAL_COMMUNITY): Payer: Self-pay | Admitting: *Deleted

## 2019-03-16 ENCOUNTER — Other Ambulatory Visit: Payer: Self-pay | Admitting: Hematology

## 2019-03-16 ENCOUNTER — Ambulatory Visit (HOSPITAL_COMMUNITY)
Admission: RE | Admit: 2019-03-16 | Discharge: 2019-03-16 | Disposition: A | Discharge status: Home / Self Care | Payer: BC Managed Care – PPO | Source: Ambulatory Visit | Attending: Internal Medicine | Admitting: Internal Medicine

## 2019-03-16 ENCOUNTER — Other Ambulatory Visit: Payer: Self-pay

## 2019-03-16 DIAGNOSIS — G620 Drug-induced polyneuropathy: Secondary | ICD-10-CM

## 2019-03-16 DIAGNOSIS — M810 Age-related osteoporosis without current pathological fracture: Secondary | ICD-10-CM | POA: Diagnosis not present

## 2019-03-16 MED ORDER — ZOLEDRONIC ACID 5 MG/100ML IV SOLN
INTRAVENOUS | Status: AC
  Filled 2019-03-16: qty 100

## 2019-03-16 MED ORDER — ZOLEDRONIC ACID 5 MG/100ML IV SOLN
5.0000 mg | Freq: Once | INTRAVENOUS | Status: AC
  Administered 2019-03-16: 5 mg via INTRAVENOUS

## 2019-03-16 NOTE — Discharge Instructions (Signed)

## 2019-03-27 ENCOUNTER — Ambulatory Visit (INDEPENDENT_AMBULATORY_CARE_PROVIDER_SITE_OTHER)
Admission: RE | Admit: 2019-03-27 | Discharge: 2019-03-27 | Disposition: A | Discharge status: Home / Self Care | Payer: BC Managed Care – PPO | Source: Ambulatory Visit | Attending: Pulmonary Disease | Admitting: Pulmonary Disease

## 2019-03-27 ENCOUNTER — Encounter (INDEPENDENT_AMBULATORY_CARE_PROVIDER_SITE_OTHER): Payer: Self-pay

## 2019-03-27 ENCOUNTER — Other Ambulatory Visit: Payer: Self-pay

## 2019-03-27 DIAGNOSIS — R918 Other nonspecific abnormal finding of lung field: Secondary | ICD-10-CM | POA: Diagnosis not present

## 2019-04-23 ENCOUNTER — Ambulatory Visit: Payer: Medicare PPO | Attending: Internal Medicine

## 2019-04-23 DIAGNOSIS — Z23 Encounter for immunization: Secondary | ICD-10-CM

## 2019-04-24 NOTE — Progress Notes (Signed)
   Covid-19 Vaccination Clinic  Name:  Taylor Walsh    MRN: [id number] DOB: 17-Aug-1955  04/23/2019  Taylor Walsh was observed post Covid-19 immunization for 15 minutes without incidence. She was provided with Vaccine Information Sheet and instruction to access the V-Safe system.   Taylor Walsh was instructed to call 911 with any severe reactions post vaccine: Marland Kitchen Difficulty breathing  . Swelling of your face and throat  . A fast heartbeat  . A bad rash all over your body  . Dizziness and weakness    Immunizations Administered    Name Date Dose VIS Date Route   Moderna COVID-19 Vaccine 04/23/2019  3:55 PM 0.5 mL 02/28/2019 Intramuscular   Manufacturer: Levan Hurst   LotAR:[id number]   LibertyVO:[id number]      Documented on behalf of:P. Joya Gaskins

## 2019-05-05 ENCOUNTER — Other Ambulatory Visit: Payer: Self-pay | Admitting: Hematology

## 2019-05-05 DIAGNOSIS — G620 Drug-induced polyneuropathy: Secondary | ICD-10-CM

## 2019-05-14 ENCOUNTER — Other Ambulatory Visit: Payer: Self-pay | Admitting: Hematology

## 2019-05-14 DIAGNOSIS — C50212 Malignant neoplasm of upper-inner quadrant of left female breast: Secondary | ICD-10-CM

## 2019-05-19 NOTE — Progress Notes (Signed)
[place]   [phone] Fax:([phone]   Clinic Follow up Note   Patient Care Team: Marton Redwood, MD as PCP - General (Internal Medicine) Melrose Nakayama, MD as Consulting Physician (Cardiothoracic Surgery) Fanny Skates, MD as Consulting Physician (General Surgery) Truitt Merle, MD as Consulting Physician (Hematology) Delice Bison, Charlestine Massed, NP as Nurse Practitioner (Hematology and Oncology) Eppie Gibson, MD as Attending Physician (Radiation Oncology)  Date of Service:  05/24/2019  CHIEF COMPLAINT: F/uleft breast cancer  SUMMARY OF ONCOLOGIC HISTORY: Oncology History Overview Note  Cancer Staging Breast cancer of upper-inner quadrant of left female breast [facility]) Staging form: Breast, AJCC 8th Edition - Clinical stage from 07/10/2016: Stage 0 (cTis (DCIS), cN0, cM0, G3, ER: Positive, PR: Positive, HER2: Not Assessed) - Signed by Truitt Merle, MD on 07/28/2016 - Pathologic stage from 10/26/2016: Stage IA (pT1a, pN1a, cM0, G2, ER: Positive, PR: Positive, HER2: Negative) - Signed by Truitt Merle, MD on 11/11/2016     Breast cancer of upper-inner quadrant of left female breast ([place])  07/10/2016 Initial Biopsy   Diagnosis 1. 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 2. 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   07/10/2016 Receptors her2   ER 95%, PR 80-90%+,  both strong staining, HER2 - (1+), Ki67 80-90%   07/22/2016 Initial Diagnosis   Ductal carcinoma in situ (DCIS) of left breast   07/27/2016 Imaging   Breast MRI w wo contrast showed  1. Extensive mass and non mass enhancement along the lower inner aspect the left breast, lower outer quadrant, spanning 10.2 cm from anterior to posterior, including both biopsied lesions, as  detailed above. This is all consistent with DCIS. 2. No other abnormal enhancement in the left breast. 3. No abnormal or enlarged axillary lymph nodes. 4. 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 The Hereditary Gene Panel offered by [facility] includes sequencing and/or deletion duplication testing of the following 46 genes: APC, ATM, AXIN2, BARD1, BMPR1A, BRCA1, BRCA2, BRIP1, CDH1, CDKN2A (p14ARF), CDKN2A (p16INK4a), CHEK2, CTNNA1, DICER1, EPCAM (Deletion/duplication testing only), GREM1 (promoter region deletion/duplication testing only), KIT, MEN1, MLH1, MSH2, MSH3, MSH6, MUTYH, NBN, NF1, NHTL1, PALB2, PDGFRA, PMS2, POLD1, POLE, PTEN, RAD50, RAD51C, RAD51D, SDHB, SDHC, SDHD, SMAD4, SMARCA4. STK11, TP53, TSC1, TSC2, and VHL.  The following gene was evaluated for sequence changes only: SDHA and HOXB13 c.251G>A variant only.  The report date is Aug 09, 2016. 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  The change in variant classification was made as a result of re-review of the evidence in light of new variant interpretation guidelines and /or new information. The updated report date is January 26, 2018.    10/13/2016 Surgery   LEFT TOTAL MASTECTOMY WITH LEFT AXILLARY SENTINEL LYMPH NODE BIOPSY By Dr. Ninfa Linden   10/13/2016 Pathology Results   Surgery Diagnosis 10/13/16  1. Lymph node, sentinel, biopsy, Left Axillary - METASTATIC CARCINOMA IN ONE LYMPH NODE (1/1). 2. Lymph node, sentinel, biopsy, Left - METASTATIC CARCINOMA IN ONE LYMPH NODE (1/1). 3. Breast, radical mastectomy (including lymph nodes), Left - INVASIVE DUCTAL CARCINOMA, 0.3 CM. - LYMPHOVASCULAR INVOLVEMENT BY CARCINOMA. - HIGH GRADE DUCTAL CARCINOMA IN SITU WITH CALCIFICATIONS AND NECROSIS, 5.5 CM. - DCIS FOCALLY 0.1  CM FROM CAUTERIZED MEDIAL MARGIN. - SEE ONCOLOGY TABLE AND COMMENT. 4. Skin , Left additional  mastectomy flap - BENIGN FIBROADIPOSE TISSUE. - NO EVIDENCE OF MALIGNANCY.   10/30/2016 Miscellaneous   MammaPrint 10/31/26 Result:  High Risk,Luminal type B MPI: -0.368 Average 10-year risk of recurrence untreated: 29%   11/18/2016 Imaging   CT CAP W Contrast 11/18/16 IMPRESSION: 1. Skin thickening and interstitial changes involving the left breast, likely related to radiation. Surgical changes are also noted with a tissue expander in place. Probable postop fluid collection in the upper outer quadrant breast. No enlarged supraclavicular or axillary lymph nodes. 2. No CT findings suggesting metastatic disease involving the chest, abdomen or pelvis or bony structures. 3. Surgical changes from a left upper lobe wedge resection. No findings for recurrent tumor.   11/23/2016 Imaging   Bone Scan Whole body 11/23/16 IMPRESSION: No definite scintigraphic evidence of osseous metastatic disease as above.   11/25/2016 - 04/07/2017 Chemotherapy   Adjuvant chemotherapy AC every 2 weeks for 4 cycles, 11/25/16-01/05/17, followed by Taxol weekly for 12 cycles starting 01/20/17    04/28/2017 - 06/11/2017 Radiation Therapy    1. Left Chest Wall / 50.4 Gy in 28 fractions 2. Left SCV and PAB / 50.4 Gy in 28 fractions  3. Left Chest Wall Boost / 10 Gy in 5 fractions    05/2017 -  Anti-estrogen oral therapy   Anastrozole initially, then changed to Letrozole in April 2019      CURRENT THERAPY:  Started Anastrozole in 05/2017. Switched to Letrozole in 06/2017  INTERVAL HISTORY:  Taylor Walsh is here for a follow up of left breast cancer. She was last seen by me 6 months ago. She presents to the clinic alone. She notes she is overall doing well. She notes she still has neuropathy in her hands, stable. She is not dropping things but frequently stretches fingers given numbness. She notes her toes feel tight at night and only has burning when standing for a while. She has been on Cymbalta. She notes LLQ sharp  pain. She notes she is able to move around in a store. She notes her trouble is standing in line for long periods of time.  She notes after having sex she thought she had Vaginal infection and took OTC suppository. With application she found her blood. She was seen by her Gyn who notes bleeding was likely due to vaginal dryness and sexual intercourse. She notes after Gyn visit in 03/2019 she was ordered oxycodone this weekend. She feels this is a discrepancy and was removed from her list.  She notes she started bisphosphonate infusion with her PCP in 2020 and gets it yearly. She notes she is still on Metformin. She has received both her COVID19 vaccinations.     REVIEW OF SYSTEMS:   Constitutional: Denies fevers, chills or abnormal weight loss Eyes: Denies blurriness of vision Ears, nose, mouth, throat, and face: Denies mucositis or sore throat Respiratory: Denies cough, dyspnea or wheezes Cardiovascular: Denies palpitation, chest discomfort or lower extremity swelling Gastrointestinal:  Denies nausea, heartburn or change in bowel habits (+) LLQ sharp pain Skin: Denies abnormal skin rashes MSK: (+) Arthritis of right knee  Lymphatics: Denies new lymphadenopathy or easy bruising Neurological: (+) Numbness of fingers and tightness of toes  Behavioral/Psych: Mood is stable, no new changes  All other systems were reviewed with the patient and are negative.  MEDICAL HISTORY:  Past Medical History:  Diagnosis Date  . Anxiety   . Arthritis   .  Breast cancer ([place])   . Cancer ([place]) 10/08/2016   left breast cancer  . Diabetes mellitus without complication ([place])   . Dyslipidemia   . Family history of breast cancer   . Family history of ovarian cancer   . Family history of prostate cancer   . GERD (gastroesophageal reflux disease)    nexium if needed  . Heart murmur   . History of radiation therapy 04/28/17- 06/11/17   Left Chest wall/ 50.4 Gy in 28 fractions. Left SCV/ 50.4 Gy in 28 fractions.  Left Chest wall boost/ 10 Gy in 5 fractions.   . Hypertension   . Hypothyroid   . Osteopenia   . Pneumonia 10/08/2016   June 2013    SURGICAL HISTORY: Past Surgical History:  Procedure Laterality Date  . ABDOMINAL HERNIA REPAIR  01/19/2019  . ABDOMINAL HYSTERECTOMY    . BREAST RECONSTRUCTION WITH PLACEMENT OF TISSUE EXPANDER AND FLEX HD (ACELLULAR HYDRATED DERMIS) Left 10/13/2016   Procedure: LEFT BREAST RECONSTRUCTION WITH PLACEMENT OF TISSUE EXPANDER AND ALLODERM;  Surgeon: Irene Limbo, MD;  Location: [place];  Service: Plastics;  Laterality: Left;  . FOOT SURGERY     bi-lat/ repaired hammer toes  . MASTECTOMY Left 10/13/2016  . MASTECTOMY W/ SENTINEL NODE BIOPSY Left 10/13/2016  . MASTECTOMY W/ SENTINEL NODE BIOPSY Left 10/13/2016   Procedure: LEFT TOTAL MASTECTOMY WITH LEFT AXILLARY SENTINEL LYMPH NODE BIOPSY;  Surgeon: Coralie Keens, MD;  Location: [place];  Service: General;  Laterality: Left;  . PORTACATH PLACEMENT Right 11/24/2016   Procedure: INSERTION PORT-A-CATH;  Surgeon: Fanny Skates, MD;  Location: [place];  Service: General;  Laterality: Right;  . TUBAL LIGATION    . VIDEO ASSISTED THORACOSCOPY (VATS)/WEDGE RESECTION Left 02/25/2015   Procedure: VIDEO ASSISTED THORACOSCOPY (VATS)/ LUL WEDGE RESECTION with ON Q placement.;  Surgeon: Melrose Nakayama, MD;  Location: [place];  Service: Thoracic;  Laterality: Left;    I have reviewed the social history and family history with the patient and they are unchanged from previous note.  ALLERGIES:  is allergic to vicodin [hydrocodone-acetaminophen].  MEDICATIONS:  Current Outpatient Medications  Medication Sig Dispense Refill  . aspirin 81 MG tablet Take 81 mg by mouth daily.    Marland Kitchen atorvastatin (LIPITOR) 40 MG tablet Take 40 mg by mouth daily.    . calcium-vitamin D 250-100 MG-UNIT tablet Take 2 tablets by mouth daily.     . DULoxetine (CYMBALTA) 20 MG capsule Take 1 capsule (20 mg total) by mouth 2  (two) times daily. 60 capsule 5  . ergocalciferol (VITAMIN D2) 50000 UNITS capsule Take 50,000 Units by mouth every Friday.     . fluticasone (FLONASE) 50 MCG/ACT nasal spray fluticasone propionate 50 mcg/actuation nasal spray,suspension  USE 1 SPRAY(S) IN EACH NOSTRIL TWICE DAILY    . ibuprofen (ADVIL,MOTRIN) 600 MG tablet Take 1 tablet (600 mg total) by mouth every 8 (eight) hours as needed. Take with food to protect stomach. 36 tablet 0  . letrozole (FEMARA) 2.5 MG tablet Take 1 tablet (2.5 mg total) by mouth daily. 90 tablet 2  . levothyroxine (SYNTHROID, LEVOTHROID) 75 MCG tablet   6  . losartan (COZAAR) 50 MG tablet TAKE 1 TABLET BY MOUTH ONCE DAILY FOR 90 DAYS  1  . metFORMIN (GLUCOPHAGE) 500 MG tablet TAKE 1 TABLET BY MOUTH TWICE DAILY WITH A MEAL (Patient taking differently: 1,000 mg 2 (two) times daily with a meal. TAKE 1 TABLET BY MOUTH TWICE DAILY WITH A MEAL) 60  tablet 2  . prochlorperazine (COMPAZINE) 5 MG tablet Take 1 tablet (5 mg total) by mouth every 6 (six) hours as needed for nausea or vomiting. 30 tablet 0  . traMADol (ULTRAM) 50 MG tablet Take 1 tablet (50 mg total) by mouth every 12 (twelve) hours as needed for moderate pain or severe pain. 20 tablet 0  . valACYclovir (VALTREX) 1000 MG tablet Take 1,000 mg by mouth daily.     No current facility-administered medications for this visit.    PHYSICAL EXAMINATION: ECOG PERFORMANCE STATUS: 1 - Symptomatic but completely ambulatory  Vitals:   05/24/19 1356  BP: (!) 141/75  Pulse: 79  Resp: 18  Temp: 98.1 F (36.7 C)  SpO2: 98%   Filed Weights   05/24/19 1356  Weight: 186 lb 4.8 oz (84.5 kg)    GENERAL:alert, no distress and comfortable SKIN: skin color, texture, turgor are normal, no rashes or significant lesions EYES: normal, Conjunctiva are pink and non-injected, sclera clear  NECK: supple, thyroid normal size, non-tender, without nodularity LYMPH:  no palpable lymphadenopathy in the cervical, axillary  LUNGS:  clear to auscultation and percussion with normal breathing effort HEART: regular rate & rhythm and no murmurs and no lower extremity edema ABDOMEN:abdomen soft, non-tender and normal bowel sounds Musculoskeletal:no cyanosis of digits and no clubbing (+) Hernia repair surgical incision healed well.  NEURO: alert & oriented x 3 with fluent speech, no focal motor/sensory deficits BREAST: s/p left mastectomy and b/l reconstruction: Surgical incision healed well with mild scar tissue of right breast. No palpable mass, nodules or adenopathy bilaterally. Breast exam benign.   LABORATORY DATA:  I have reviewed the data as listed CBC Latest Ref Rng & Units 05/24/2019 11/18/2018 05/20/2018  WBC 4.0 - 10.5 K/uL 3.8(L) 4.1 4.4  Hemoglobin 12.0 - 15.0 g/dL 12.9 12.5 12.1  Hematocrit 36.0 - 46.0 % 40.8 39.8 39.0  Platelets 150 - 400 K/uL 251 248 249     CMP Latest Ref Rng & Units 05/24/2019 11/18/2018 05/20/2018  Glucose 70 - 99 mg/dL 157(H) 100(H) 177(H)  BUN 8 - 23 mg/dL _0 Creatinine 0.44 - 1.00 mg/dL 0.87 0.74 0.86  Sodium 135 - 145 mmol/L 140 139 141  Potassium 3.5 - 5.1 mmol/L 4.0 4.1 4.0  Chloride 98 - 111 mmol/L 104 103 104  CO2 22 - 32 mmol/L _1 Calcium 8.9 - 10.3 mg/dL 9.2 9.2 9.5  Total Protein 6.5 - 8.1 g/dL 7.7 7.4 7.5  Total Bilirubin 0.3 - 1.2 mg/dL 0.4 0.7 0.6  Alkaline Phos 38 - 126 U/L 69 63 68  AST 15 - 41 U/L _2 ALT 0 - 44 U/L 28 42 24      RADIOGRAPHIC STUDIES: I have personally reviewed the radiological images as listed and agreed with the findings in the report. No results found.   ASSESSMENT & PLAN:  MEOSHA CASTANON is a 64 y.o. female with    1. 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 She is s/p left mastectomywith reconstruction, adjuvant chemo AC-T and radiation.  -She started  anti-estrogen therapy with Anastrozole in 05/2017 but due to poortoleranceshe switched to Letrozole in 06/2017. She is tolerating well overall -She underwent right breast reduction on 08/31/18 for symmetry.  -From a breast cancer standpoint She is clinically doing well. Lab reviewed, her  CBC and CMP are within normal limits except WBC 3.8, MCV 78.3, ANC 1.5. Her physical exam and her 07/2018 mammogram were unremarkable. There is no clinical concern for recurrence. -Continue surveillance. Next Mammogram in 07/2019.  -continue Letrozole.  -F/u in 6 months -She has received both her COVID19 vaccinations.   2. Arthritis -She experiences mild arthritis in LE, more specific to her right knee -She is pretty active -Will monitor while onLetrozole  3. Osteopenia  -Last bone density scan ordered by Dr Brigitte Pulse [phone] -DEXA from 07/28/17 shows Osteopenia  -She started injection or infusion once yearly (likely [place]) with her PCP in 2020.  -She will f/u with Dr. Brigitte Pulse. Next DEXA in 2021 or 2022.   4. Smoking cessation:The patient quit smoking in 07/2016  5. 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  6. History of pulmonary amyloidosis -She was found to have a lung nodule in October 2016, which was surgically removed, and showed pulmonary amyloidosis -Previous lab showed no evidence of systemic amyloidosis -Continue monitoring. No evidence of recurrence on 03/27/19 CT chest.  -She has not been able to see Dr. Lake Bells due to COVID-19.   7.Chemo induced peripheral Neuropathy, G2 -She was previously on Tramadol for the pain. Shetriedgabapentin, not very effective. She is currently on Cymbalta which helps.  -Neuropathy with numbness of fingers and tightness or tingling of her toes has not changed.  -She can only walk certain distances and not stand for too long. She feels this will likely be permeant. Will continue Cymbalta and monitoring.    -She is not able to walk for long distance due to her neuropathy, which likely will be permanent. She states she need permanent handicap sticker. I will fill out form but I also recommend she continue to exercise and ambulate well.   8.DM -She is on metformin -f/u with PCP -Dr. Brigitte Pulse increased her Metformin to 1071m BID. After losing weight she started taking Metformin while she eats and her weight is now stable.   9.Memory loss  -She noticed somemild memory loss since she completed chemo. -will screen her for the REMEMBER clinical study, she previously met our research nurseabout more information.  -Her memory is overall back to baseline now.   10. Low MCV -She notes only 1 episode of mild vaginal bleeding after sexual intercourse in early 2021.  -Labs shows [place] MCV 78.3 and MCHC 24.8 today (05/24/19) -will check iron level next time  -Her last colonoscopy was with Dr. MWatt Climesin 2016. I will refer her to a new [facility] when she is due for repeat colonoscopy.   PLAN: -Continue Letrozole, refilled today  -Mammogram in 07/2019 -phone visit in 6 months (she will have lab with her PCP around same time) -lab and f/u with lacie in 12 months  -I will sign handicap sticker for her.    No problem-specific Assessment & Plan notes found for this encounter.   Orders Placed This Encounter  Procedures  . MM Digital Screening Unilat R    Standing Status:   Future    Standing Expiration Date:   05/23/2020    Order Specific Question:   Reason for Exam (SYMPTOM  OR DIAGNOSIS REQUIRED)    Answer:   screening    Order Specific Question:   [facility]    [facility]  All questions were answered. The patient knows to call the clinic with any problems, questions or concerns. No barriers to learning  was detected. The total time spent in the appointment was 30 minutes.     Truitt Merle, MD 05/24/2019   I, Joslyn Devon, am acting as scribe for Truitt Merle, MD.    I have reviewed the above documentation for accuracy and completeness, and I agree with the above.

## 2019-05-21 ENCOUNTER — Ambulatory Visit: Payer: Medicare PPO | Attending: Internal Medicine

## 2019-05-21 DIAGNOSIS — Z23 Encounter for immunization: Secondary | ICD-10-CM | POA: Insufficient documentation

## 2019-05-21 NOTE — Progress Notes (Signed)
   Covid-19 Vaccination Clinic  Name:  Taylor Walsh    MRN: [id number] DOB: 10-31-1955  05/21/2019  Taylor Walsh was observed post Covid-19 immunization for 15 minutes without incidence. She was provided with Vaccine Information Sheet and instruction to access the V-Safe system.   Taylor Walsh was instructed to call 911 with any severe reactions post vaccine: Marland Kitchen Difficulty breathing  . Swelling of your face and throat  . A fast heartbeat  . A bad rash all over your body  . Dizziness and weakness    Immunizations Administered    Name Date Dose VIS Date Route   Moderna COVID-19 Vaccine 05/21/2019 12:08 PM 0.5 mL 02/28/2019 Intramuscular   Manufacturer: Moderna   Lot: [id number]   [id number]

## 2019-05-24 ENCOUNTER — Inpatient Hospital Stay: Payer: Medicare PPO | Attending: Hematology

## 2019-05-24 ENCOUNTER — Other Ambulatory Visit: Payer: Self-pay

## 2019-05-24 ENCOUNTER — Inpatient Hospital Stay (HOSPITAL_BASED_OUTPATIENT_CLINIC_OR_DEPARTMENT_OTHER): Payer: Medicare PPO | Admitting: Hematology

## 2019-05-24 ENCOUNTER — Encounter: Payer: Self-pay | Admitting: Hematology

## 2019-05-24 VITALS — BP 141/75 | HR 79 | Temp 98.1°F | Resp 18 | Ht 63.0 in | Wt 186.3 lb

## 2019-05-24 DIAGNOSIS — G620 Drug-induced polyneuropathy: Secondary | ICD-10-CM | POA: Diagnosis not present

## 2019-05-24 DIAGNOSIS — R413 Other amnesia: Secondary | ICD-10-CM | POA: Diagnosis not present

## 2019-05-24 DIAGNOSIS — M8580 Other specified disorders of bone density and structure, unspecified site: Secondary | ICD-10-CM | POA: Insufficient documentation

## 2019-05-24 DIAGNOSIS — E119 Type 2 diabetes mellitus without complications: Secondary | ICD-10-CM | POA: Insufficient documentation

## 2019-05-24 DIAGNOSIS — Z170 Estrogen receptor positive status [ER+]: Secondary | ICD-10-CM

## 2019-05-24 DIAGNOSIS — C50412 Malignant neoplasm of upper-outer quadrant of left female breast: Secondary | ICD-10-CM | POA: Diagnosis present

## 2019-05-24 DIAGNOSIS — C50212 Malignant neoplasm of upper-inner quadrant of left female breast: Secondary | ICD-10-CM

## 2019-05-24 DIAGNOSIS — M1711 Unilateral primary osteoarthritis, right knee: Secondary | ICD-10-CM | POA: Insufficient documentation

## 2019-05-24 DIAGNOSIS — Z1231 Encounter for screening mammogram for malignant neoplasm of breast: Secondary | ICD-10-CM

## 2019-05-24 DIAGNOSIS — R718 Other abnormality of red blood cells: Secondary | ICD-10-CM

## 2019-05-24 DIAGNOSIS — Z5111 Encounter for antineoplastic chemotherapy: Secondary | ICD-10-CM

## 2019-05-24 DIAGNOSIS — Z79811 Long term (current) use of aromatase inhibitors: Secondary | ICD-10-CM | POA: Diagnosis not present

## 2019-05-24 DIAGNOSIS — T451X5A Adverse effect of antineoplastic and immunosuppressive drugs, initial encounter: Secondary | ICD-10-CM

## 2019-05-24 LAB — CBC WITH DIFFERENTIAL/PLATELET
Abs Immature Granulocytes: 0.01 10*3/uL (ref 0.00–0.07)
Basophils Absolute: 0 10*3/uL (ref 0.0–0.1)
Basophils Relative: 1 %
Eosinophils Absolute: 0.2 10*3/uL (ref 0.0–0.5)
Eosinophils Relative: 5 %
HCT: 40.8 % (ref 36.0–46.0)
Hemoglobin: 12.9 g/dL (ref 12.0–15.0)
Immature Granulocytes: 0 %
Lymphocytes Relative: 37 %
Lymphs Abs: 1.4 10*3/uL (ref 0.7–4.0)
MCH: 24.8 pg — ABNORMAL LOW (ref 26.0–34.0)
MCHC: 31.6 g/dL (ref 30.0–36.0)
MCV: 78.3 fL — ABNORMAL LOW (ref 80.0–100.0)
Monocytes Absolute: 0.7 10*3/uL (ref 0.1–1.0)
Monocytes Relative: 19 %
Neutro Abs: 1.5 10*3/uL — ABNORMAL LOW (ref 1.7–7.7)
Neutrophils Relative %: 38 %
Platelets: 251 10*3/uL (ref 150–400)
RBC: 5.21 MIL/uL — ABNORMAL HIGH (ref 3.87–5.11)
RDW: 17.2 % — ABNORMAL HIGH (ref 11.5–15.5)
WBC: 3.8 10*3/uL — ABNORMAL LOW (ref 4.0–10.5)
nRBC: 0 % (ref 0.0–0.2)

## 2019-05-24 LAB — COMPREHENSIVE METABOLIC PANEL
ALT: 28 U/L (ref 0–44)
AST: 21 U/L (ref 15–41)
Albumin: 3.9 g/dL (ref 3.5–5.0)
Alkaline Phosphatase: 69 U/L (ref 38–126)
Anion gap: 9 (ref 5–15)
BUN: 10 mg/dL (ref 8–23)
CO2: 27 mmol/L (ref 22–32)
Calcium: 9.2 mg/dL (ref 8.9–10.3)
Chloride: 104 mmol/L (ref 98–111)
Creatinine, Ser: 0.87 mg/dL (ref 0.44–1.00)
GFR calc Af Amer: >60 mL/min (ref >60)
GFR calc non Af Amer: >60 mL/min (ref >60)
Glucose, Bld: 157 mg/dL — ABNORMAL HIGH (ref 70–99)
Potassium: 4 mmol/L (ref 3.5–5.1)
Sodium: 140 mmol/L (ref 135–145)
Total Bilirubin: 0.4 mg/dL (ref 0.3–1.2)
Total Protein: 7.7 g/dL (ref 6.5–8.1)

## 2019-05-24 MED ORDER — LETROZOLE 2.5 MG PO TABS: 2.5000 mg | ORAL_TABLET | Freq: Every day | ORAL | 2 refills | Status: DC

## 2019-05-24 MED ORDER — DULOXETINE HCL 20 MG PO CPEP: 20.0000 mg | ORAL_CAPSULE | Freq: Two times a day (BID) | ORAL | 5 refills | Status: DC

## 2019-05-25 ENCOUNTER — Telehealth: Payer: Self-pay | Admitting: Hematology

## 2019-05-25 NOTE — Telephone Encounter (Signed)
Scheduled appt per 2/24 los.  Left a vm of the appt date and time. 

## 2019-09-05 ENCOUNTER — Other Ambulatory Visit: Payer: Self-pay

## 2019-09-05 ENCOUNTER — Ambulatory Visit
Admission: RE | Admit: 2019-09-05 | Discharge: 2019-09-05 | Disposition: A | Discharge status: Home / Self Care | Payer: Medicare PPO | Source: Ambulatory Visit | Attending: Hematology | Admitting: Hematology

## 2019-09-05 DIAGNOSIS — Z1231 Encounter for screening mammogram for malignant neoplasm of breast: Secondary | ICD-10-CM

## 2019-11-06 DIAGNOSIS — M810 Age-related osteoporosis without current pathological fracture: Secondary | ICD-10-CM | POA: Insufficient documentation

## 2019-11-06 HISTORY — DX: Age-related osteoporosis without current pathological fracture: M81.0

## 2019-11-09 DIAGNOSIS — M62830 Muscle spasm of back: Secondary | ICD-10-CM | POA: Diagnosis not present

## 2019-11-09 DIAGNOSIS — Z6834 Body mass index (BMI) 34.0-34.9, adult: Secondary | ICD-10-CM | POA: Diagnosis not present

## 2019-11-09 DIAGNOSIS — Z01419 Encounter for gynecological examination (general) (routine) without abnormal findings: Secondary | ICD-10-CM | POA: Diagnosis not present

## 2019-11-09 DIAGNOSIS — N941 Unspecified dyspareunia: Secondary | ICD-10-CM | POA: Diagnosis not present

## 2019-11-11 ENCOUNTER — Encounter (HOSPITAL_COMMUNITY): Payer: Self-pay

## 2019-11-11 ENCOUNTER — Ambulatory Visit (HOSPITAL_COMMUNITY)
Admission: EM | Admit: 2019-11-11 | Discharge: 2019-11-11 | Disposition: A | Discharge status: Home / Self Care | Payer: Medicare PPO | Attending: Family Medicine | Admitting: Family Medicine

## 2019-11-11 ENCOUNTER — Other Ambulatory Visit: Payer: Self-pay

## 2019-11-11 DIAGNOSIS — Z20822 Contact with and (suspected) exposure to covid-19: Secondary | ICD-10-CM | POA: Diagnosis not present

## 2019-11-11 DIAGNOSIS — R0981 Nasal congestion: Secondary | ICD-10-CM | POA: Diagnosis not present

## 2019-11-11 NOTE — Discharge Instructions (Signed)
You have been tested for COVID-19 today. °If your test returns positive, you will receive a phone call from [facility] regarding your results. °Negative test results are not called. °Both positive and negative results area always visible on MyChart. °If you do not have a MyChart account, sign up instructions are provided in your discharge papers. °Please do not hesitate to contact us should you have questions or concerns. ° °

## 2019-11-11 NOTE — ED Triage Notes (Signed)
Per grandma pt has runny nosex1 wk and COVID exposure.

## 2019-11-12 LAB — SARS CORONAVIRUS 2 (TAT 6-24 HRS): SARS Coronavirus 2: NEGATIVE

## 2019-11-13 NOTE — ED Provider Notes (Signed)
[facility]   [id number] 11/11/19 Arrival Time: 4081  ASSESSMENT & PLAN:  1. Exposure to COVID-19 virus   2. Nasal congestion      COVID-19 testing sent. OTC symptom care as needed.   Follow-up Information    Marton Redwood, MD.   Specialty: Internal Medicine Why: As needed. Contact information: [address] [place] [address] [phone]               Reviewed expectations re: course of current medical issues. Questions answered. Outlined signs and symptoms indicating need for more acute intervention. Understanding verbalized. After Visit Summary given.   SUBJECTIVE: History from: patient. TAMU GOLZ is a 64 y.o. female who requests COVID-19 testing. Known COVID-19 contact: reports exposure. Recent travel: none. Reports: runny nose past week. Denies: difficulty breathing and fever. Normal PO intake without n/v/d.    OBJECTIVE:  Vitals:   11/11/19 1526  BP: 129/88  Pulse: (!) 102  Resp: 18  Temp: 98.3 F (36.8 C)  TempSrc: Oral  SpO2: 100%  Weight: 88 kg    General appearance: alert; no distress Eyes: PERRLA; EOMI; conjunctiva normal HENT: [place]; AT; nasal cong Neck: supple  Lungs: speaks full sentences without difficulty; unlabored Extremities: no edema Skin: warm and dry Neurologic: normal gait Psychological: alert and cooperative; normal mood and affect  Labs:  Labs Reviewed  SARS CORONAVIRUS 2 (TAT 6-24 HRS)     Allergies  Allergen Reactions  . Vicodin [Hydrocodone-Acetaminophen] Swelling and Other (See Comments)    SWELLING REACTION UNSPECIFIED     Past Medical History:  Diagnosis Date  . Anxiety   . Arthritis   . Breast cancer ([facility])   . Cancer ([facility]) 10/08/2016   left breast cancer  . Diabetes mellitus without complication ([facility])   . Dyslipidemia   . Family history of breast cancer   . Family history of ovarian cancer   . Family history of prostate cancer   . GERD (gastroesophageal reflux disease)     nexium if needed  . Heart murmur   . History of radiation therapy 04/28/17- 06/11/17   Left Chest wall/ 50.4 Gy in 28 fractions. Left SCV/ 50.4 Gy in 28 fractions. Left Chest wall boost/ 10 Gy in 5 fractions.   . Hypertension   . Hypothyroid   . Osteopenia   . Pneumonia 10/08/2016   June 2013   Social History   Socioeconomic History  . Marital status: Single    Spouse name: Not on file  . Number of children: 2  . Years of education: Not on file  . Highest education level: Not on file  Occupational History  . Not on file  Tobacco Use  . Smoking status: Former Smoker    Packs/day: 1.00    Years: 42.00    Pack years: 42.00    Quit date: 08/27/2016    Years since quitting: 3.2  . Smokeless tobacco: Never Used  Vaping Use  . Vaping Use: Never used  Substance and Sexual Activity  . Alcohol use: No    Alcohol/week: 0.0 standard drinks    Comment: once or twice yearly.  . Drug use: No  . Sexual activity: Not on file  Other Topics Concern  . Not on file  Social History Narrative  . Not on file   Social Determinants of Health   Financial Resource Strain:   . Difficulty of Paying Living Expenses:   Food Insecurity:   . Worried About Charity fundraiser in the Last  Year:   . Ran Out of Food in the Last Year:   Transportation Needs:   . Film/video editor (Medical):   Marland Kitchen Lack of Transportation (Non-Medical):   Physical Activity:   . Days of Exercise per Week:   . Minutes of Exercise per Session:   Stress:   . Feeling of Stress :   Social Connections:   . Frequency of Communication with Friends and Family:   . Frequency of Social Gatherings with Friends and Family:   . Attends Religious Services:   . Active Member of Clubs or Organizations:   . Attends Archivist Meetings:   Marland Kitchen Marital Status:   Intimate Partner Violence:   . Fear of Current or Ex-Partner:   . Emotionally Abused:   Marland Kitchen Physically Abused:   . Sexually Abused:    Family History  Problem  Relation Age of Onset  . Cancer Mother        brain, ovarian  . Hypertension Mother   . Stroke Father   . Hypertension Father   . Cancer Sister 41       bilateral breast cancer, 2nd cancer at 33  . Breast cancer Sister   . Cancer Brother        dx with prostate cancer in the 22s  . Cancer Brother        dx in his 10s with prostate cancer  . Brain cancer Maternal Uncle   . Cirrhosis Brother   . Cancer Brother        NOS  . Cancer Paternal Aunt        NOS  . Cancer Cousin        paternal cousin with cancer NOS   Past Surgical History:  Procedure Laterality Date  . ABDOMINAL HERNIA REPAIR  01/19/2019  . ABDOMINAL HYSTERECTOMY    . BREAST RECONSTRUCTION WITH PLACEMENT OF TISSUE EXPANDER AND FLEX HD (ACELLULAR HYDRATED DERMIS) Left 10/13/2016   Procedure: LEFT BREAST RECONSTRUCTION WITH PLACEMENT OF TISSUE EXPANDER AND ALLODERM;  Surgeon: Irene Limbo, MD;  Location: [facility];  Service: Plastics;  Laterality: Left;  . FOOT SURGERY     bi-lat/ repaired hammer toes  . MASTECTOMY Left 10/13/2016  . MASTECTOMY W/ SENTINEL NODE BIOPSY Left 10/13/2016  . MASTECTOMY W/ SENTINEL NODE BIOPSY Left 10/13/2016   Procedure: LEFT TOTAL MASTECTOMY WITH LEFT AXILLARY SENTINEL LYMPH NODE BIOPSY;  Surgeon: Coralie Keens, MD;  Location: [facility];  Service: General;  Laterality: Left;  . PORTACATH PLACEMENT Right 11/24/2016   Procedure: INSERTION PORT-A-CATH;  Surgeon: Fanny Skates, MD;  Location: [facility];  Service: General;  Laterality: Right;  . TUBAL LIGATION    . VIDEO ASSISTED THORACOSCOPY (VATS)/WEDGE RESECTION Left 02/25/2015   Procedure: VIDEO ASSISTED THORACOSCOPY (VATS)/ LUL WEDGE RESECTION with ON Q placement.;  Surgeon: Melrose Nakayama, MD;  Location: [facility];  Service: Thoracic;  Laterality: Left;     Vanessa Kick, MD 11/13/19 [id number]

## 2019-11-17 DIAGNOSIS — Z20828 Contact with and (suspected) exposure to other viral communicable diseases: Secondary | ICD-10-CM | POA: Diagnosis not present

## 2019-11-22 ENCOUNTER — Ambulatory Visit: Payer: Medicare PPO | Admitting: Hematology

## 2019-11-24 ENCOUNTER — Telehealth: Payer: Self-pay | Admitting: Hematology

## 2019-11-24 NOTE — Progress Notes (Signed)
[facility]   [phone] [phone]   Clinic Follow up Note   Patient Care Team: Marton Redwood, MD as PCP - General (Internal Medicine) Melrose Nakayama, MD as Consulting Physician (Cardiothoracic Surgery) Fanny Skates, MD as Consulting Physician (General Surgery) Truitt Merle, MD as Consulting Physician (Hematology) Delice Bison Charlestine Massed, NP as Nurse Practitioner (Hematology and Oncology) Eppie Gibson, MD as Attending Physician (Radiation Oncology)   I connected with Taylor Walsh on 11/27/2019 at  2:00 PM EDT by telephone visit and verified that I am speaking with the correct person using two identifiers.  I discussed the limitations, risks, security and privacy concerns of performing an evaluation and management service by telephone and the availability of in person appointments. I also discussed with the patient that there may be a patient responsible charge related to this service. The patient expressed understanding and agreed to proceed.   Other persons participating in the visit and their role in the encounter:  None  Patient's location:  Her home  Provider's location:  My office   CHIEF COMPLAINT:  F/uleft breast cancer  SUMMARY OF ONCOLOGIC HISTORY: Oncology History Overview Note  Cancer Staging Breast cancer of upper-inner quadrant of left female breast ([facility]) Staging form: Breast, AJCC 8th Edition - Clinical stage from 07/10/2016: Stage 0 (cTis (DCIS), cN0, cM0, G3, ER: Positive, PR: Positive, HER2: Not Assessed) - Signed by Truitt Merle, MD on 07/28/2016 - Pathologic stage from 10/26/2016: Stage IA (pT1a, pN1a, cM0, G2, ER: Positive, PR: Positive, HER2: Negative) - Signed by Truitt Merle, MD on 11/11/2016     Breast cancer of upper-inner quadrant of left female breast ([facility])  07/10/2016 Initial Biopsy   Diagnosis 1. Breast, left, needle core biopsy, upper inner quadrant, anterior (near nipple) - DUCTAL CARCINOMA IN SITU, HIGH NUCLEAR  GRADE WITH NECROSIS AND CALCIFICATIONS. 2. 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   07/10/2016 Receptors her2   ER 95%, PR 80-90%+,  both strong staining, HER2 - (1+), Ki67 80-90%   07/22/2016 Initial Diagnosis   Ductal carcinoma in situ (DCIS) of left breast   07/27/2016 Imaging   Breast MRI w wo contrast showed  1. Extensive mass and non mass enhancement along the lower inner aspect the left breast, lower outer quadrant, spanning 10.2 cm from anterior to posterior, including both biopsied lesions, as detailed above. This is all consistent with DCIS. 2. No other abnormal enhancement in the left breast. 3. No abnormal or enlarged axillary lymph nodes. 4. 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 The Hereditary Gene Panel offered by [facility] includes sequencing and/or deletion duplication testing of the following 46 genes: APC, ATM, AXIN2, BARD1, BMPR1A, BRCA1, BRCA2, BRIP1, CDH1, CDKN2A (p14ARF), CDKN2A (p16INK4a), CHEK2, CTNNA1, DICER1, EPCAM (Deletion/duplication testing only), GREM1 (promoter region deletion/duplication testing only), KIT, MEN1, MLH1, MSH2, MSH3, MSH6, MUTYH, NBN, NF1, NHTL1, PALB2, PDGFRA, PMS2, POLD1, POLE, PTEN, RAD50, RAD51C, RAD51D, SDHB, SDHC, SDHD, SMAD4, SMARCA4. STK11, TP53, TSC1, TSC2, and VHL.  The following gene was evaluated for sequence changes only: SDHA and HOXB13 c.251G>A variant only.  The report date is Aug 09, 2016. 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  The change in variant classification was made as a result of re-review of the evidence in light of new variant interpretation guidelines and /  or new information. The updated report date is January 26, 2018.    10/13/2016 Surgery   LEFT TOTAL MASTECTOMY WITH LEFT AXILLARY SENTINEL  LYMPH NODE BIOPSY By Dr. Ninfa Linden   10/13/2016 Pathology Results   Surgery Diagnosis 10/13/16  1. Lymph node, sentinel, biopsy, Left Axillary - METASTATIC CARCINOMA IN ONE LYMPH NODE (1/1). 2. Lymph node, sentinel, biopsy, Left - METASTATIC CARCINOMA IN ONE LYMPH NODE (1/1). 3. Breast, radical mastectomy (including lymph nodes), Left - INVASIVE DUCTAL CARCINOMA, 0.3 CM. - LYMPHOVASCULAR INVOLVEMENT BY CARCINOMA. - HIGH GRADE DUCTAL CARCINOMA IN SITU WITH CALCIFICATIONS AND NECROSIS, 5.5 CM. - DCIS FOCALLY 0.1 CM FROM CAUTERIZED MEDIAL MARGIN. - SEE ONCOLOGY TABLE AND COMMENT. 4. Skin , Left additional mastectomy flap - BENIGN FIBROADIPOSE TISSUE. - NO EVIDENCE OF MALIGNANCY.   10/30/2016 Miscellaneous   MammaPrint 10/31/26 Result:  High Risk,Luminal type B MPI: -0.368 Average 10-year risk of recurrence untreated: 29%   11/18/2016 Imaging   CT CAP W Contrast 11/18/16 IMPRESSION: 1. Skin thickening and interstitial changes involving the left breast, likely related to radiation. Surgical changes are also noted with a tissue expander in place. Probable postop fluid collection in the upper outer quadrant breast. No enlarged supraclavicular or axillary lymph nodes. 2. No CT findings suggesting metastatic disease involving the chest, abdomen or pelvis or bony structures. 3. Surgical changes from a left upper lobe wedge resection. No findings for recurrent tumor.   11/23/2016 Imaging   Bone Scan Whole body 11/23/16 IMPRESSION: No definite scintigraphic evidence of osseous metastatic disease as above.   11/25/2016 - 04/07/2017 Chemotherapy   Adjuvant chemotherapy AC every 2 weeks for 4 cycles, 11/25/16-01/05/17, followed by Taxol weekly for 12 cycles starting 01/20/17    04/28/2017 - 06/11/2017 Radiation Therapy    1. Left Chest Wall / 50.4 Gy in 28 fractions 2. Left SCV and PAB / 50.4 Gy in 28 fractions  3. Left Chest Wall Boost / 10 Gy in 5 fractions    05/2017 -  Anti-estrogen oral  therapy   Anastrozole initially, then changed to Letrozole in April 2019      CURRENT THERAPY:  Started Anastrozole in 05/2017. Switched to Letrozole in 06/2017  INTERVAL HISTORY:   Taylor Walsh is here for a follow up of left breast cancer. She was last seen by me 6 months ago in clinic. She is overall doing well. She notes upon waking up she will feel very stiff and struggles to move initially. She notes she has increased her walking. She has yet to use her exercise bike and ab lounger. She notes her neuropathy is slowly improving. She notes she has not been taking Cymbalta since she did not refill it. Her Neuropathy did not feel different off Cymbalta. She will f/u with Dr Brigitte Pulse in 02/2020. She will have lab with him then, she has not had lab since 05/2019.  She notes having fatigue still.    REVIEW OF SYSTEMS:   Constitutional: Denies fevers, chills or abnormal weight loss (+) Fatigue  Eyes: Denies blurriness of vision Ears, nose, mouth, throat, and face: Denies mucositis or sore throat Respiratory: Denies cough, dyspnea or wheezes Cardiovascular: Denies palpitation, chest discomfort or lower extremity swelling Gastrointestinal:  Denies nausea, heartburn or change in bowel habits Skin: Denies abnormal skin rashes MSK: (+) significant joint stiffness Lymphatics: (+) Neuropathy of hands and feet slowly improving.  Neurological:Denies numbness, tingling or new weaknesses Behavioral/Psych: Mood is stable, no new changes  All other systems were reviewed with the patient  and are negative.  MEDICAL HISTORY:  Past Medical History:  Diagnosis Date  . Anxiety   . Arthritis   . Breast cancer ([facility])   . Cancer ([facility]) 10/08/2016   left breast cancer  . Diabetes mellitus without complication ([facility])   . Dyslipidemia   . Family history of breast cancer   . Family history of ovarian cancer   . Family history of prostate cancer   . GERD (gastroesophageal reflux disease)    nexium if needed  .  Heart murmur   . History of radiation therapy 04/28/17- 06/11/17   Left Chest wall/ 50.4 Gy in 28 fractions. Left SCV/ 50.4 Gy in 28 fractions. Left Chest wall boost/ 10 Gy in 5 fractions.   . Hypertension   . Hypothyroid   . Osteopenia   . Pneumonia 10/08/2016   June 2013    SURGICAL HISTORY: Past Surgical History:  Procedure Laterality Date  . ABDOMINAL HERNIA REPAIR  01/19/2019  . ABDOMINAL HYSTERECTOMY    . BREAST RECONSTRUCTION WITH PLACEMENT OF TISSUE EXPANDER AND FLEX HD (ACELLULAR HYDRATED DERMIS) Left 10/13/2016   Procedure: LEFT BREAST RECONSTRUCTION WITH PLACEMENT OF TISSUE EXPANDER AND ALLODERM;  Surgeon: Irene Limbo, MD;  Location: [facility];  Service: Plastics;  Laterality: Left;  . FOOT SURGERY     bi-lat/ repaired hammer toes  . MASTECTOMY Left 10/13/2016  . MASTECTOMY W/ SENTINEL NODE BIOPSY Left 10/13/2016  . MASTECTOMY W/ SENTINEL NODE BIOPSY Left 10/13/2016   Procedure: LEFT TOTAL MASTECTOMY WITH LEFT AXILLARY SENTINEL LYMPH NODE BIOPSY;  Surgeon: Coralie Keens, MD;  Location: [facility];  Service: General;  Laterality: Left;  . PORTACATH PLACEMENT Right 11/24/2016   Procedure: INSERTION PORT-A-CATH;  Surgeon: Fanny Skates, MD;  Location: [facility];  Service: General;  Laterality: Right;  . TUBAL LIGATION    . VIDEO ASSISTED THORACOSCOPY (VATS)/WEDGE RESECTION Left 02/25/2015   Procedure: VIDEO ASSISTED THORACOSCOPY (VATS)/ LUL WEDGE RESECTION with ON Q placement.;  Surgeon: Melrose Nakayama, MD;  Location: [facility];  Service: Thoracic;  Laterality: Left;    I have reviewed the social history and family history with the patient and they are unchanged from previous note.  ALLERGIES:  is allergic to vicodin [hydrocodone-acetaminophen].  MEDICATIONS:  Current Outpatient Medications  Medication Sig Dispense Refill  . aspirin 81 MG tablet Take 81 mg by mouth daily.    Marland Kitchen atorvastatin (LIPITOR) 40 MG tablet Take 40 mg by mouth daily.    .  calcium-vitamin D 250-100 MG-UNIT tablet Take 2 tablets by mouth daily.     . DULoxetine (CYMBALTA) 20 MG capsule Take 1 capsule (20 mg total) by mouth 2 (two) times daily. 60 capsule 5  . ergocalciferol (VITAMIN D2) 50000 UNITS capsule Take 50,000 Units by mouth every Friday.     . fluticasone (FLONASE) 50 MCG/ACT nasal spray fluticasone propionate 50 mcg/actuation nasal spray,suspension  USE 1 SPRAY(S) IN EACH NOSTRIL TWICE DAILY    . ibuprofen (ADVIL,MOTRIN) 600 MG tablet Take 1 tablet (600 mg total) by mouth every 8 (eight) hours as needed. Take with food to protect stomach. 36 tablet 0  . letrozole (FEMARA) 2.5 MG tablet Take 1 tablet (2.5 mg total) by mouth daily. 90 tablet 2  . levothyroxine (SYNTHROID, LEVOTHROID) 75 MCG tablet   6  . losartan (COZAAR) 50 MG tablet TAKE 1 TABLET BY MOUTH ONCE DAILY FOR 90 DAYS  1  . metFORMIN (GLUCOPHAGE) 500 MG tablet TAKE 1 TABLET BY MOUTH TWICE DAILY WITH A MEAL (  Patient taking differently: 1,000 mg 2 (two) times daily with a meal. TAKE 1 TABLET BY MOUTH TWICE DAILY WITH A MEAL) 60 tablet 2  . prochlorperazine (COMPAZINE) 5 MG tablet Take 1 tablet (5 mg total) by mouth every 6 (six) hours as needed for nausea or vomiting. 30 tablet 0  . traMADol (ULTRAM) 50 MG tablet Take 1 tablet (50 mg total) by mouth every 12 (twelve) hours as needed for moderate pain or severe pain. 20 tablet 0  . valACYclovir (VALTREX) 1000 MG tablet Take 1,000 mg by mouth daily.     No current facility-administered medications for this visit.    PHYSICAL EXAMINATION: ECOG PERFORMANCE STATUS: 1 - Symptomatic but completely ambulatory  No vitals taken today, Exam not performed today   LABORATORY DATA:  I have reviewed the data as listed CBC Latest Ref Rng & Units 05/24/2019 11/18/2018 05/20/2018  WBC 4.0 - 10.5 K/uL 3.8(L) 4.1 4.4  Hemoglobin 12.0 - 15.0 g/dL 12.9 12.5 12.1  Hematocrit 36 - 46 % 40.8 39.8 39.0  Platelets 150 - 400 K/uL 251 248 249     CMP Latest Ref Rng &  Units 05/24/2019 11/18/2018 05/20/2018  Glucose 70 - 99 mg/dL 157(H) 100(H) 177(H)  BUN 8 - 23 mg/dL _0 Creatinine 0.44 - 1.00 mg/dL 0.87 0.74 0.86  Sodium 135 - 145 mmol/L 140 139 141  Potassium 3.5 - 5.1 mmol/L 4.0 4.1 4.0  Chloride 98 - 111 mmol/L 104 103 104  CO2 22 - 32 mmol/L _1 Calcium 8.9 - 10.3 mg/dL 9.2 9.2 9.5  Total Protein 6.5 - 8.1 g/dL 7.7 7.4 7.5  Total Bilirubin 0.3 - 1.2 mg/dL 0.4 0.7 0.6  Alkaline Phos 38 - 126 U/L 69 63 68  AST 15 - 41 U/L _2 ALT 0 - 44 U/L 28 42 24      RADIOGRAPHIC STUDIES: I have personally reviewed the radiological images as listed and agreed with the findings in the report. No results found.   ASSESSMENT & PLAN:  Taylor Walsh is a 64 y.o. female with   1. 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 She is s/p left mastectomywith reconstruction, adjuvant chemo AC-T and radiation.  -She started anti-estrogen therapy with Anastrozole in 05/2017 but due to poortoleranceshe switched to Letrozole in 06/2017. She is tolerating well overallwith joint stiffness.  -She underwent right breast reduction on 08/31/18 for symmetry. -She is clinically doing well, but does report fatigue lately, will repeat her CBC and iron study in the next few weeks. 08/2019 Mammogram benign. She has no concerns.  -Continue surveillance -continue Letrozole.  -F/u in 6 months  2. Arthritis -She experiences mild arthritis in LE, more specific to her right knee -She does have significantly joint stiffness with initially morning movements. This is secondary to Letrozole. I encouraged her to increase exercise to help.   3. Osteopenia  -DEXA from 07/28/17 shows Osteopenia -She started injection or infusion once yearly (likely [place]) with her PCP in 2020.  -She will f/u with Dr. Brigitte Pulse. Next  DEXA in 2021 or 2022.   4. Smoking cessation:The patient quit smoking in 07/2016  5. Genetics was negative for pathogenetic mutations with VUS of ATM c.4066A>G identified  6. History of pulmonary amyloidosis -She was found to have a lung nodule in October 2016,  which was surgically removed, and showed pulmonary amyloidosis -Previous lab showed no evidence of systemic amyloidosis -Continue monitoring. No evidence of recurrence on 03/27/19 CT chest.  -She has not been able to see Dr. Lake Bells due to COVID-19.  7.Chemo induced peripheral Neuropathy, G1 -She was previously on Tramadol for the pain. Shetriedgabapentin, not very effective, and Cymbalta.  -Her neuropathy in her hands and feet are slowly improving and has not required medication for this.    8.DM -She is on metformin. Will continue to f/u with PCP  9.H/o Memory loss  -She noticed somemild memory loss since she completed chemo. -will screen her for the REMEMBER clinical study, she previously met our research nurseabout more information.  -Her memory isoverall back to baseline now.   10. Low MCV  -She notes only 1 episode of mild vaginal bleeding after sexual intercourse in early 2021.  -05/02/19 Labs shows [place] MCV 78.3 and MCHC 24.8  -will check iron level next time  -Her last colonoscopy was with Dr. Watt Climes in 2016. I will refer her to a new [facility] when she is due for repeat colonoscopy.  -She notes still having moderate fatigue in the past 6 months. Will repeat labs in 1-2 weeks.    PLAN: -Continue Letrozole -Lab in 1-2 weeks for CBC and iron study  -Lab and f/u in 6 months    No problem-specific Assessment & Plan notes found for this encounter.   No orders of the defined types were placed in this encounter.  I discussed the assessment and treatment plan with the patient. The patient was provided an opportunity to ask questions and all were answered. The patient agreed with the plan and  demonstrated an understanding of the instructions.  The patient was advised to call back or seek an in-person evaluation if the symptoms worsen or if the condition fails to improve as anticipated.  The total time spent in the appointment was 20 minutes.    Truitt Merle, MD 11/27/2019   I, Joslyn Devon, am acting as scribe for Truitt Merle, MD.   I have reviewed the above documentation for accuracy and completeness, and I agree with the above.

## 2019-11-27 ENCOUNTER — Inpatient Hospital Stay: Payer: Medicare PPO | Attending: Hematology | Admitting: Hematology

## 2019-11-27 ENCOUNTER — Encounter: Payer: Self-pay | Admitting: Hematology

## 2019-11-27 DIAGNOSIS — C50212 Malignant neoplasm of upper-inner quadrant of left female breast: Secondary | ICD-10-CM | POA: Diagnosis not present

## 2019-11-27 DIAGNOSIS — Z170 Estrogen receptor positive status [ER+]: Secondary | ICD-10-CM | POA: Diagnosis not present

## 2019-11-28 ENCOUNTER — Telehealth: Payer: Self-pay | Admitting: Hematology

## 2019-11-28 NOTE — Telephone Encounter (Signed)
Scheduled per 8/30 los. Unable to reach pt. Left voicemail with appt time and date. 6 month f/u is already scheduled per 2/24 los

## 2019-12-06 ENCOUNTER — Other Ambulatory Visit: Payer: Self-pay

## 2019-12-06 ENCOUNTER — Inpatient Hospital Stay: Payer: Medicare PPO | Attending: Hematology

## 2019-12-06 DIAGNOSIS — C50212 Malignant neoplasm of upper-inner quadrant of left female breast: Secondary | ICD-10-CM | POA: Insufficient documentation

## 2019-12-06 DIAGNOSIS — D6481 Anemia due to antineoplastic chemotherapy: Secondary | ICD-10-CM | POA: Insufficient documentation

## 2019-12-06 DIAGNOSIS — R718 Other abnormality of red blood cells: Secondary | ICD-10-CM

## 2019-12-06 DIAGNOSIS — E039 Hypothyroidism, unspecified: Secondary | ICD-10-CM | POA: Insufficient documentation

## 2019-12-06 DIAGNOSIS — Z170 Estrogen receptor positive status [ER+]: Secondary | ICD-10-CM

## 2019-12-06 LAB — COMPREHENSIVE METABOLIC PANEL
ALT: 23 U/L (ref 0–44)
AST: 21 U/L (ref 15–41)
Albumin: 4.1 g/dL (ref 3.5–5.0)
Alkaline Phosphatase: 79 U/L (ref 38–126)
Anion gap: 9 (ref 5–15)
BUN: 13 mg/dL (ref 8–23)
CO2: 27 mmol/L (ref 22–32)
Calcium: 9.4 mg/dL (ref 8.9–10.3)
Chloride: 101 mmol/L (ref 98–111)
Creatinine, Ser: 0.86 mg/dL (ref 0.44–1.00)
GFR calc Af Amer: >60 mL/min (ref >60)
GFR calc non Af Amer: >60 mL/min (ref >60)
Glucose, Bld: 108 mg/dL — ABNORMAL HIGH (ref 70–99)
Potassium: 3.9 mmol/L (ref 3.5–5.1)
Sodium: 137 mmol/L (ref 135–145)
Total Bilirubin: 0.7 mg/dL (ref 0.3–1.2)
Total Protein: 8.1 g/dL (ref 6.5–8.1)

## 2019-12-06 LAB — IRON AND TIBC
Iron: 53 ug/dL (ref 41–142)
Saturation Ratios: 14 % — ABNORMAL LOW (ref 21–57)
TIBC: 378 ug/dL (ref 236–444)
UIBC: 325 ug/dL (ref 120–384)

## 2019-12-06 LAB — CBC WITH DIFFERENTIAL/PLATELET
Abs Immature Granulocytes: 0.02 10*3/uL (ref 0.00–0.07)
Basophils Absolute: 0 10*3/uL (ref 0.0–0.1)
Basophils Relative: 0 %
Eosinophils Absolute: 0.1 10*3/uL (ref 0.0–0.5)
Eosinophils Relative: 2 %
HCT: 41.1 % (ref 36.0–46.0)
Hemoglobin: 13.1 g/dL (ref 12.0–15.0)
Immature Granulocytes: 0 %
Lymphocytes Relative: 37 %
Lymphs Abs: 2 10*3/uL (ref 0.7–4.0)
MCH: 26.5 pg (ref 26.0–34.0)
MCHC: 31.9 g/dL (ref 30.0–36.0)
MCV: 83 fL (ref 80.0–100.0)
Monocytes Absolute: 0.6 10*3/uL (ref 0.1–1.0)
Monocytes Relative: 11 %
Neutro Abs: 2.7 10*3/uL (ref 1.7–7.7)
Neutrophils Relative %: 50 %
Platelets: 293 10*3/uL (ref 150–400)
RBC: 4.95 MIL/uL (ref 3.87–5.11)
RDW: 15.9 % — ABNORMAL HIGH (ref 11.5–15.5)
WBC: 5.4 10*3/uL (ref 4.0–10.5)
nRBC: 0 % (ref 0.0–0.2)

## 2019-12-06 LAB — FERRITIN: Ferritin: 43 ng/mL (ref 11–307)

## 2019-12-07 ENCOUNTER — Other Ambulatory Visit: Payer: Self-pay

## 2019-12-07 DIAGNOSIS — E039 Hypothyroidism, unspecified: Secondary | ICD-10-CM

## 2019-12-07 NOTE — Progress Notes (Signed)
Patient called 12/06/2019 asking for thyroid panel to be added to her labs.  Reviewed with Dr. Burr Medico and she ok'd.  Order placed and lab called to add on.

## 2019-12-08 ENCOUNTER — Encounter: Payer: Self-pay | Admitting: Hematology

## 2019-12-08 LAB — THYROID PANEL WITH TSH
Free Thyroxine Index: 2 (ref 1.2–4.9)
T3 Uptake Ratio: 26 % (ref 24–39)
T4, Total: 7.6 ug/dL (ref 4.5–12.0)
TSH: 1.85 u[IU]/mL (ref 0.450–4.500)

## 2020-01-26 DIAGNOSIS — Z1152 Encounter for screening for COVID-19: Secondary | ICD-10-CM | POA: Diagnosis not present

## 2020-01-26 DIAGNOSIS — R059 Cough, unspecified: Secondary | ICD-10-CM | POA: Diagnosis not present

## 2020-01-26 DIAGNOSIS — J069 Acute upper respiratory infection, unspecified: Secondary | ICD-10-CM | POA: Diagnosis not present

## 2020-02-16 ENCOUNTER — Ambulatory Visit: Payer: Medicare PPO | Attending: Family

## 2020-02-16 DIAGNOSIS — Z23 Encounter for immunization: Secondary | ICD-10-CM

## 2020-03-06 DIAGNOSIS — E890 Postprocedural hypothyroidism: Secondary | ICD-10-CM | POA: Diagnosis not present

## 2020-03-06 DIAGNOSIS — E785 Hyperlipidemia, unspecified: Secondary | ICD-10-CM | POA: Diagnosis not present

## 2020-03-06 DIAGNOSIS — M859 Disorder of bone density and structure, unspecified: Secondary | ICD-10-CM | POA: Diagnosis not present

## 2020-03-06 DIAGNOSIS — E1149 Type 2 diabetes mellitus with other diabetic neurological complication: Secondary | ICD-10-CM | POA: Diagnosis not present

## 2020-03-13 DIAGNOSIS — I10 Essential (primary) hypertension: Secondary | ICD-10-CM | POA: Diagnosis not present

## 2020-03-13 DIAGNOSIS — K921 Melena: Secondary | ICD-10-CM | POA: Diagnosis not present

## 2020-03-13 DIAGNOSIS — M810 Age-related osteoporosis without current pathological fracture: Secondary | ICD-10-CM | POA: Diagnosis not present

## 2020-03-13 DIAGNOSIS — M25561 Pain in right knee: Secondary | ICD-10-CM | POA: Diagnosis not present

## 2020-03-13 DIAGNOSIS — E859 Amyloidosis, unspecified: Secondary | ICD-10-CM | POA: Diagnosis not present

## 2020-03-13 DIAGNOSIS — Z23 Encounter for immunization: Secondary | ICD-10-CM | POA: Diagnosis not present

## 2020-03-13 DIAGNOSIS — I2510 Atherosclerotic heart disease of native coronary artery without angina pectoris: Secondary | ICD-10-CM | POA: Diagnosis not present

## 2020-03-13 DIAGNOSIS — R82998 Other abnormal findings in urine: Secondary | ICD-10-CM | POA: Diagnosis not present

## 2020-03-13 DIAGNOSIS — C50919 Malignant neoplasm of unspecified site of unspecified female breast: Secondary | ICD-10-CM | POA: Diagnosis not present

## 2020-03-13 DIAGNOSIS — Z0000 Encounter for general adult medical examination without abnormal findings: Secondary | ICD-10-CM | POA: Diagnosis not present

## 2020-03-13 DIAGNOSIS — E1149 Type 2 diabetes mellitus with other diabetic neurological complication: Secondary | ICD-10-CM | POA: Diagnosis not present

## 2020-03-27 ENCOUNTER — Other Ambulatory Visit (HOSPITAL_COMMUNITY): Payer: Self-pay

## 2020-03-27 NOTE — Discharge Instructions (Signed)

## 2020-03-28 ENCOUNTER — Ambulatory Visit (HOSPITAL_COMMUNITY)
Admission: RE | Admit: 2020-03-28 | Discharge: 2020-03-28 | Disposition: A | Discharge status: Home / Self Care | Payer: Medicare PPO | Source: Ambulatory Visit | Attending: Internal Medicine | Admitting: Internal Medicine

## 2020-03-28 ENCOUNTER — Other Ambulatory Visit: Payer: Self-pay

## 2020-03-28 DIAGNOSIS — M810 Age-related osteoporosis without current pathological fracture: Secondary | ICD-10-CM | POA: Insufficient documentation

## 2020-03-28 MED ORDER — ZOLEDRONIC ACID 5 MG/100ML IV SOLN
5.0000 mg | Freq: Once | INTRAVENOUS | Status: AC
  Administered 2020-03-28: 5 mg via INTRAVENOUS

## 2020-03-28 MED ORDER — ZOLEDRONIC ACID 5 MG/100ML IV SOLN
INTRAVENOUS | Status: AC
  Filled 2020-03-28: qty 100

## 2020-04-07 DIAGNOSIS — Z20828 Contact with and (suspected) exposure to other viral communicable diseases: Secondary | ICD-10-CM | POA: Diagnosis not present

## 2020-05-17 ENCOUNTER — Telehealth: Payer: Self-pay | Admitting: Hematology

## 2020-05-17 NOTE — Telephone Encounter (Signed)
Moved lab and follow-up appointment to an earlier time and to Dr. Ernestina Penna schedule per provider's request. Patient is aware of changes.

## 2020-05-22 NOTE — Progress Notes (Signed)
Taylor Walsh   [phone] Fax:([phone]) [phone]   Clinic Follow up Note   Patient Care Team: Marton Redwood, MD as PCP - General (Internal Medicine) Melrose Nakayama, MD as Consulting Physician (Cardiothoracic Surgery) Fanny Skates, MD as Consulting Physician (General Surgery) Truitt Merle, MD as Consulting Physician (Hematology) Delice Bison, Charlestine Massed, NP as Nurse Practitioner (Hematology and Oncology) Eppie Gibson, MD as Attending Physician (Radiation Oncology)  Date of Service:  05/23/2020  CHIEF COMPLAINT: F/uleft breast cancer  SUMMARY OF ONCOLOGIC HISTORY: Oncology History Overview Note  Cancer Staging Breast cancer of upper-inner quadrant of left female breast [facility]) Staging form: Breast, AJCC 8th Edition - Clinical stage from 07/10/2016: Stage 0 (cTis (DCIS), cN0, cM0, G3, ER: Positive, PR: Positive, HER2: Not Assessed) - Signed by Truitt Merle, MD on 07/28/2016 - Pathologic stage from 10/26/2016: Stage IA (pT1a, pN1a, cM0, G2, ER: Positive, PR: Positive, HER2: Negative) - Signed by Truitt Merle, MD on 11/11/2016     Breast cancer of upper-inner quadrant of left female breast ([place])  07/10/2016 Initial Biopsy   Diagnosis 1. 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 2. 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   07/10/2016 Receptors her2   ER 95%, PR 80-90%+,  both strong staining, HER2 - (1+), Ki67 80-90%   07/22/2016 Initial Diagnosis   Ductal carcinoma in situ (DCIS) of left breast   07/27/2016 Imaging   Breast MRI w wo contrast showed  1. Extensive mass and non mass enhancement along the lower inner aspect the left breast, lower outer quadrant, spanning 10.2 cm from anterior to posterior, including both biopsied lesions, as  detailed above. This is all consistent with DCIS. 2. No other abnormal enhancement in the left breast. 3. No abnormal or enlarged axillary lymph nodes. 4. 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 The Hereditary Gene Panel offered by [facility] includes sequencing and/or deletion duplication testing of the following 46 genes: APC, ATM, AXIN2, BARD1, BMPR1A, BRCA1, BRCA2, BRIP1, CDH1, CDKN2A (p14ARF), CDKN2A (p16INK4a), CHEK2, CTNNA1, DICER1, EPCAM (Deletion/duplication testing only), GREM1 (promoter region deletion/duplication testing only), KIT, MEN1, MLH1, MSH2, MSH3, MSH6, MUTYH, NBN, NF1, NHTL1, PALB2, PDGFRA, PMS2, POLD1, POLE, PTEN, RAD50, RAD51C, RAD51D, SDHB, SDHC, SDHD, SMAD4, SMARCA4. STK11, TP53, TSC1, TSC2, and VHL.  The following gene was evaluated for sequence changes only: SDHA and HOXB13 c.251G>A variant only.  The report date is Aug 09, 2016. 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  The change in variant classification was made as a result of re-review of the evidence in light of new variant interpretation guidelines and /or new information. The updated report date is January 26, 2018.    10/13/2016 Surgery   LEFT TOTAL MASTECTOMY WITH LEFT AXILLARY SENTINEL LYMPH NODE BIOPSY By Dr. Ninfa Linden   10/13/2016 Pathology Results   Surgery Diagnosis 10/13/16  1. Lymph node, sentinel, biopsy, Left Axillary - METASTATIC CARCINOMA IN ONE LYMPH NODE (1/1). 2. Lymph node, sentinel, biopsy, Left - METASTATIC CARCINOMA IN ONE LYMPH NODE (1/1). 3. Breast, radical mastectomy (including lymph nodes), Left - INVASIVE DUCTAL CARCINOMA, 0.3 CM. - LYMPHOVASCULAR INVOLVEMENT BY CARCINOMA. - HIGH GRADE DUCTAL CARCINOMA IN SITU WITH CALCIFICATIONS AND NECROSIS, 5.5 CM. - DCIS FOCALLY 0.1  CM FROM CAUTERIZED MEDIAL MARGIN. - SEE ONCOLOGY TABLE AND COMMENT. 4. Skin , Left additional  mastectomy flap - BENIGN FIBROADIPOSE TISSUE. - NO EVIDENCE OF MALIGNANCY.   10/30/2016 Miscellaneous   MammaPrint 10/31/26 Result:  High Risk,Luminal type B MPI: -0.368 Average 10-year risk of recurrence untreated: 29%   11/18/2016 Imaging   CT CAP W Contrast 11/18/16 IMPRESSION: 1. Skin thickening and interstitial changes involving the left breast, likely related to radiation. Surgical changes are also noted with a tissue expander in place. Probable postop fluid collection in the upper outer quadrant breast. No enlarged supraclavicular or axillary lymph nodes. 2. No CT findings suggesting metastatic disease involving the chest, abdomen or pelvis or bony structures. 3. Surgical changes from a left upper lobe wedge resection. No findings for recurrent tumor.   11/23/2016 Imaging   Bone Scan Whole body 11/23/16 IMPRESSION: No definite scintigraphic evidence of osseous metastatic disease as above.   11/25/2016 - 04/07/2017 Chemotherapy   Adjuvant chemotherapy AC every 2 weeks for 4 cycles, 11/25/16-01/05/17, followed by Taxol weekly for 12 cycles starting 01/20/17    04/28/2017 - 06/11/2017 Radiation Therapy    1. Left Chest Wall / 50.4 Gy in 28 fractions 2. Left SCV and PAB / 50.4 Gy in 28 fractions  3. Left Chest Wall Boost / 10 Gy in 5 fractions    05/2017 -  Anti-estrogen oral therapy   Anastrozole initially, then changed to Letrozole in April 2019      CURRENT THERAPY:  Started Anastrozole in 05/2017. Switched to Letrozole in 06/2017  INTERVAL HISTORY:  Taylor Walsh is here for a follow up of left breast cancer. She was last seen by me 6 months ago. She presents to the clinic alone. She is doing well overall. She has three grandchildren at home and they keep her busy. She still works part time. She denies any pain, except right knee pain especially with rainy weather. She tolerates letrozole well, hot flush is less.   All other systems were reviewed with the patient and are  negative.  MEDICAL HISTORY:  Past Medical History:  Diagnosis Date  . Anxiety   . Arthritis   . Breast cancer ([place])   . Cancer ([place]) 10/08/2016   left breast cancer  . Diabetes mellitus without complication ([place])   . Dyslipidemia   . Family history of breast cancer   . Family history of ovarian cancer   . Family history of prostate cancer   . GERD (gastroesophageal reflux disease)    nexium if needed  . Heart murmur   . History of radiation therapy 04/28/17- 06/11/17   Left Chest wall/ 50.4 Gy in 28 fractions. Left SCV/ 50.4 Gy in 28 fractions. Left Chest wall boost/ 10 Gy in 5 fractions.   . Hypertension   . Hypothyroid   . Osteopenia   . Pneumonia 10/08/2016   June 2013    SURGICAL HISTORY: Past Surgical History:  Procedure Laterality Date  . ABDOMINAL HERNIA REPAIR  01/19/2019  . ABDOMINAL HYSTERECTOMY    . BREAST RECONSTRUCTION WITH PLACEMENT OF TISSUE EXPANDER AND FLEX HD (ACELLULAR HYDRATED DERMIS) Left 10/13/2016   Procedure: LEFT BREAST RECONSTRUCTION WITH PLACEMENT OF TISSUE EXPANDER AND ALLODERM;  Surgeon: Irene Limbo, MD;  Location: [place];  Service: Plastics;  Laterality: Left;  . FOOT SURGERY     bi-lat/ repaired hammer toes  . MASTECTOMY Left 10/13/2016  . MASTECTOMY W/ SENTINEL NODE BIOPSY Left 10/13/2016  . MASTECTOMY W/ SENTINEL NODE BIOPSY Left 10/13/2016  Procedure: LEFT TOTAL MASTECTOMY WITH LEFT AXILLARY SENTINEL LYMPH NODE BIOPSY;  Surgeon: Coralie Keens, MD;  Location: [place];  Service: General;  Laterality: Left;  . PORTACATH PLACEMENT Right 11/24/2016   Procedure: INSERTION PORT-A-CATH;  Surgeon: Fanny Skates, MD;  Location: [place];  Service: General;  Laterality: Right;  . TUBAL LIGATION    . VIDEO ASSISTED THORACOSCOPY (VATS)/WEDGE RESECTION Left 02/25/2015   Procedure: VIDEO ASSISTED THORACOSCOPY (VATS)/ LUL WEDGE RESECTION with ON Q placement.;  Surgeon: Melrose Nakayama, MD;  Location: [place];  Service: Thoracic;   Laterality: Left;    I have reviewed the social history and family history with the patient and they are unchanged from previous note.  ALLERGIES:  is allergic to vicodin [hydrocodone-acetaminophen].  MEDICATIONS:  Current Outpatient Medications  Medication Sig Dispense Refill  . aspirin 81 MG tablet Take 81 mg by mouth daily.    Marland Kitchen atorvastatin (LIPITOR) 40 MG tablet Take 40 mg by mouth daily.    . calcium-vitamin D 250-100 MG-UNIT tablet Take 2 tablets by mouth daily.     . DULoxetine (CYMBALTA) 20 MG capsule Take 1 capsule (20 mg total) by mouth 2 (two) times daily. 60 capsule 5  . ergocalciferol (VITAMIN D2) 50000 UNITS capsule Take 50,000 Units by mouth every Friday.     . fluticasone (FLONASE) 50 MCG/ACT nasal spray fluticasone propionate 50 mcg/actuation nasal spray,suspension  USE 1 SPRAY(S) IN EACH NOSTRIL TWICE DAILY    . ibuprofen (ADVIL,MOTRIN) 600 MG tablet Take 1 tablet (600 mg total) by mouth every 8 (eight) hours as needed. Take with food to protect stomach. 36 tablet 0  . letrozole (FEMARA) 2.5 MG tablet Take 1 tablet (2.5 mg total) by mouth daily. 90 tablet 2  . levothyroxine (SYNTHROID, LEVOTHROID) 75 MCG tablet   6  . losartan (COZAAR) 50 MG tablet TAKE 1 TABLET BY MOUTH ONCE DAILY FOR 90 DAYS  1  . metFORMIN (GLUCOPHAGE) 500 MG tablet TAKE 1 TABLET BY MOUTH TWICE DAILY WITH A MEAL (Patient taking differently: 1,000 mg 2 (two) times daily with a meal. TAKE 1 TABLET BY MOUTH TWICE DAILY WITH A MEAL) 60 tablet 2  . prochlorperazine (COMPAZINE) 5 MG tablet Take 1 tablet (5 mg total) by mouth every 6 (six) hours as needed for nausea or vomiting. 30 tablet 0  . traMADol (ULTRAM) 50 MG tablet Take 1 tablet (50 mg total) by mouth every 12 (twelve) hours as needed for moderate pain or severe pain. 20 tablet 0  . valACYclovir (VALTREX) 1000 MG tablet Take 1,000 mg by mouth daily.     No current facility-administered medications for this visit.    PHYSICAL EXAMINATION: ECOG  PERFORMANCE STATUS: 0 - Asymptomatic  Vitals:   05/23/20 0908  BP: (!) 148/88  Pulse: 90  Resp: 16  Temp: (!) 97.4 F (36.3 C)  SpO2: 98%   Filed Weights   05/23/20 0908  Weight: 186 lb 12.8 oz (84.7 kg)   GENERAL:alert, no distress and comfortable SKIN: skin color, texture, turgor are normal, no rashes or significant lesions EYES: normal, Conjunctiva are pink and non-injected, sclera clear NECK: supple, thyroid normal size, non-tender, without nodularity LYMPH:  no palpable lymphadenopathy in the cervical, axillary  LUNGS: clear to auscultation and percussion with normal breathing effort HEART: regular rate & rhythm and no murmurs and no lower extremity edema ABDOMEN:abdomen soft, non-tender and normal bowel sounds Musculoskeletal:no cyanosis of digits and no clubbing  NEURO: alert & oriented x 3 with fluent  speech, no focal motor/sensory deficits Breasts: Breast inspection showed s/p left mastectomy with reconstruction. Palpation of the breasts and axilla revealed no obvious mass that I could appreciate.   LABORATORY DATA:  I have reviewed the data as listed CBC Latest Ref Rng & Units 05/23/2020 12/06/2019 05/24/2019  WBC 4.0 - 10.5 K/uL 3.9(L) 5.4 3.8(L)  Hemoglobin 12.0 - 15.0 g/dL 12.8 13.1 12.9  Hematocrit 36.0 - 46.0 % 41.0 41.1 40.8  Platelets 150 - 400 K/uL 269 293 251     CMP Latest Ref Rng & Units 12/06/2019 05/24/2019 11/18/2018  Glucose 70 - 99 mg/dL 108(H) 157(H) 100(H)  BUN 8 - 23 mg/dL '13 10 15  ' Creatinine 0.44 - 1.00 mg/dL 0.86 0.87 0.74  Sodium 135 - 145 mmol/L 137 140 139  Potassium 3.5 - 5.1 mmol/L 3.9 4.0 4.1  Chloride 98 - 111 mmol/L 101 104 103  CO2 22 - 32 mmol/L '27 27 26  ' Calcium 8.9 - 10.3 mg/dL 9.4 9.2 9.2  Total Protein 6.5 - 8.1 g/dL 8.1 7.7 7.4  Total Bilirubin 0.3 - 1.2 mg/dL 0.7 0.4 0.7  Alkaline Phos 38 - 126 U/L 79 69 63  AST 15 - 41 U/L '21 21 29  ' ALT 0 - 44 U/L 23 28 42      RADIOGRAPHIC STUDIES: I have personally reviewed the  radiological images as listed and agreed with the findings in the report. No results found.   ASSESSMENT & PLAN:  DENNI FRANCE is a 65 y.o. female with   1. 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 She is s/p left mastectomywith reconstruction, adjuvant chemo AC-T and radiation.  -She started anti-estrogen therapy with Anastrozole in 05/2017 but due to poortoleranceshe switched to Letrozole in 06/2017. She is tolerating well overallwith joint stiffness.  -She underwent right breast reduction on 08/31/18 for symmetry. -She is clinically doing well. Lab reviewed, her CBC are within normal limits except for mild leukopenia CMP still pending.  Her physical exam and her 08/2019 mammogram were unremarkable. There is no clinical concern for recurrence. -Continue letrozole, she is tolerating well, plan for total 7 to 10 years.  High risk of recurrence -Follow-up in 6 months   2. Arthritis -She experiences mild arthritis in LE, more specific to her right knee -She does have significantly joint stiffness with initially morning movements. This is secondary to Letrozole. I encouraged her to increase exercise to help.   3. Osteopenia  -DEXA from 07/28/17 shows Osteopenia -She started injection or infusion once yearly (likely [place]) with her PCP in 2020. -She will f/u with Dr. Brigitte Pulse. Next DEXA is due this year, will be done at her PCP office   4. Smoking cessation:The patient quit smoking in 07/2016  5. Genetics was negative for pathogenetic mutations with VUS of ATM c.4066A>G identified  6. History of pulmonary amyloidosis -She was found to have a lung nodule in October 2016, which was surgically removed, and showed pulmonary amyloidosis -Previous lab showed no evidence of systemic amyloidosis -Continue monitoring. No evidence  of recurrence on 03/27/19 CT chest. -She has not been ableto see Dr. [place] Bells due to COVID-19.  7.DM -She is on metformin. Will continue to f/u with PCP   PLAN: -she is clinically doing well  -Continue Letrozole -Lab and f/u with lacie in 6 months  -Right screening mammogram in June 2020  No problem-specific Assessment & Plan notes found for this encounter.   Orders Placed This Encounter  Procedures  . MM Digital Screening Unilat R    Standing Status:   Future    Standing Expiration Date:   05/23/2021    Order Specific Question:   Reason for Exam (SYMPTOM  OR DIAGNOSIS REQUIRED)    Answer:   screening    Order Specific Question:   Preferred imaging location?    Answer:   [facility]   All questions were answered. The patient knows to call the clinic with any problems, questions or concerns. No barriers to learning was detected. The total time spent in the appointment was 30 minutes.     Truitt Merle, MD 05/23/2020   I, Joslyn Devon, am acting as scribe for Truitt Merle, MD.   I have reviewed the above documentation for accuracy and completeness, and I agree with the above.

## 2020-05-23 ENCOUNTER — Telehealth: Payer: Self-pay | Admitting: Hematology

## 2020-05-23 ENCOUNTER — Inpatient Hospital Stay: Payer: Medicare PPO

## 2020-05-23 ENCOUNTER — Other Ambulatory Visit: Payer: Self-pay

## 2020-05-23 ENCOUNTER — Encounter: Payer: Self-pay | Admitting: Hematology

## 2020-05-23 ENCOUNTER — Inpatient Hospital Stay: Payer: Medicare PPO | Admitting: Nurse Practitioner

## 2020-05-23 ENCOUNTER — Inpatient Hospital Stay: Payer: Medicare PPO | Admitting: Hematology

## 2020-05-23 ENCOUNTER — Inpatient Hospital Stay: Payer: Medicare PPO | Attending: Hematology

## 2020-05-23 VITALS — BP 148/88 | HR 90 | Temp 97.4°F | Resp 16 | Ht 63.0 in | Wt 186.8 lb

## 2020-05-23 DIAGNOSIS — Z79811 Long term (current) use of aromatase inhibitors: Secondary | ICD-10-CM | POA: Insufficient documentation

## 2020-05-23 DIAGNOSIS — Z170 Estrogen receptor positive status [ER+]: Secondary | ICD-10-CM | POA: Diagnosis not present

## 2020-05-23 DIAGNOSIS — Z87891 Personal history of nicotine dependence: Secondary | ICD-10-CM | POA: Diagnosis not present

## 2020-05-23 DIAGNOSIS — Z79899 Other long term (current) drug therapy: Secondary | ICD-10-CM | POA: Diagnosis not present

## 2020-05-23 DIAGNOSIS — Z923 Personal history of irradiation: Secondary | ICD-10-CM | POA: Diagnosis not present

## 2020-05-23 DIAGNOSIS — E039 Hypothyroidism, unspecified: Secondary | ICD-10-CM | POA: Insufficient documentation

## 2020-05-23 DIAGNOSIS — Z7984 Long term (current) use of oral hypoglycemic drugs: Secondary | ICD-10-CM | POA: Insufficient documentation

## 2020-05-23 DIAGNOSIS — Z8042 Family history of malignant neoplasm of prostate: Secondary | ICD-10-CM | POA: Diagnosis not present

## 2020-05-23 DIAGNOSIS — Z803 Family history of malignant neoplasm of breast: Secondary | ICD-10-CM | POA: Insufficient documentation

## 2020-05-23 DIAGNOSIS — Z7982 Long term (current) use of aspirin: Secondary | ICD-10-CM | POA: Insufficient documentation

## 2020-05-23 DIAGNOSIS — Z9012 Acquired absence of left breast and nipple: Secondary | ICD-10-CM | POA: Diagnosis not present

## 2020-05-23 DIAGNOSIS — C50212 Malignant neoplasm of upper-inner quadrant of left female breast: Secondary | ICD-10-CM

## 2020-05-23 DIAGNOSIS — E119 Type 2 diabetes mellitus without complications: Secondary | ICD-10-CM | POA: Insufficient documentation

## 2020-05-23 DIAGNOSIS — M8580 Other specified disorders of bone density and structure, unspecified site: Secondary | ICD-10-CM | POA: Diagnosis not present

## 2020-05-23 DIAGNOSIS — Z8041 Family history of malignant neoplasm of ovary: Secondary | ICD-10-CM | POA: Insufficient documentation

## 2020-05-23 DIAGNOSIS — I10 Essential (primary) hypertension: Secondary | ICD-10-CM | POA: Diagnosis not present

## 2020-05-23 DIAGNOSIS — Z1231 Encounter for screening mammogram for malignant neoplasm of breast: Secondary | ICD-10-CM | POA: Diagnosis not present

## 2020-05-23 LAB — COMPREHENSIVE METABOLIC PANEL
ALT: 24 U/L (ref 0–44)
AST: 20 U/L (ref 15–41)
Albumin: 3.9 g/dL (ref 3.5–5.0)
Alkaline Phosphatase: 60 U/L (ref 38–126)
Anion gap: 10 (ref 5–15)
BUN: 11 mg/dL (ref 8–23)
CO2: 24 mmol/L (ref 22–32)
Calcium: 9.3 mg/dL (ref 8.9–10.3)
Chloride: 106 mmol/L (ref 98–111)
Creatinine, Ser: 0.85 mg/dL (ref 0.44–1.00)
GFR, Estimated: >60 mL/min (ref >60)
Glucose, Bld: 147 mg/dL — ABNORMAL HIGH (ref 70–99)
Potassium: 3.8 mmol/L (ref 3.5–5.1)
Sodium: 140 mmol/L (ref 135–145)
Total Bilirubin: 0.7 mg/dL (ref 0.3–1.2)
Total Protein: 7.6 g/dL (ref 6.5–8.1)

## 2020-05-23 LAB — CBC WITH DIFFERENTIAL/PLATELET
Abs Immature Granulocytes: 0.01 10*3/uL (ref 0.00–0.07)
Basophils Absolute: 0 10*3/uL (ref 0.0–0.1)
Basophils Relative: 0 %
Eosinophils Absolute: 0.1 10*3/uL (ref 0.0–0.5)
Eosinophils Relative: 2 %
HCT: 41 % (ref 36.0–46.0)
Hemoglobin: 12.8 g/dL (ref 12.0–15.0)
Immature Granulocytes: 0 %
Lymphocytes Relative: 41 %
Lymphs Abs: 1.6 10*3/uL (ref 0.7–4.0)
MCH: 26.2 pg (ref 26.0–34.0)
MCHC: 31.2 g/dL (ref 30.0–36.0)
MCV: 84 fL (ref 80.0–100.0)
Monocytes Absolute: 0.4 10*3/uL (ref 0.1–1.0)
Monocytes Relative: 11 %
Neutro Abs: 1.7 10*3/uL (ref 1.7–7.7)
Neutrophils Relative %: 46 %
Platelets: 269 10*3/uL (ref 150–400)
RBC: 4.88 MIL/uL (ref 3.87–5.11)
RDW: 16.5 % — ABNORMAL HIGH (ref 11.5–15.5)
WBC: 3.9 10*3/uL — ABNORMAL LOW (ref 4.0–10.5)
nRBC: 0 % (ref 0.0–0.2)

## 2020-05-23 MED ORDER — LETROZOLE 2.5 MG PO TABS: 2.5000 mg | ORAL_TABLET | Freq: Every day | ORAL | 2 refills | Status: DC

## 2020-05-23 NOTE — Telephone Encounter (Signed)
Scheduled follow-up appointment per 2/24 los. Patient is aware. °

## 2020-06-04 NOTE — Progress Notes (Signed)
   Covid-19 Vaccination Clinic  Name:  Taylor Walsh    MRN: [id number] DOB: 07-16-1955  06/04/2020  Ms. [name] was observed post Covid-19 immunization for 15 minutes without incident. She was provided with Vaccine Information Sheet and instruction to access the V-Safe system.   Ms. [name] was instructed to call 911 with any severe reactions post vaccine: Marland Kitchen Difficulty breathing  . Swelling of face and throat  . A fast heartbeat  . A bad rash all over body  . Dizziness and weakness   Immunizations Administered    Name Date Dose VIS Date Route   Moderna Covid-19 Booster Vaccine 02/16/2020 11:15 AM 0.25 mL 01/17/2020 Intramuscular   Manufacturer: Moderna   Lot: [id number]   [place]: [id number]

## 2020-07-19 DIAGNOSIS — E119 Type 2 diabetes mellitus without complications: Secondary | ICD-10-CM | POA: Diagnosis not present

## 2020-07-19 DIAGNOSIS — H40033 Anatomical narrow angle, bilateral: Secondary | ICD-10-CM | POA: Diagnosis not present

## 2020-07-23 DIAGNOSIS — Z800 Family history of malignant neoplasm of digestive organs: Secondary | ICD-10-CM | POA: Diagnosis not present

## 2020-07-23 DIAGNOSIS — Z1211 Encounter for screening for malignant neoplasm of colon: Secondary | ICD-10-CM | POA: Diagnosis not present

## 2020-07-23 DIAGNOSIS — K635 Polyp of colon: Secondary | ICD-10-CM | POA: Diagnosis not present

## 2020-09-05 ENCOUNTER — Ambulatory Visit: Payer: Medicare PPO

## 2020-09-06 ENCOUNTER — Ambulatory Visit
Admission: RE | Admit: 2020-09-06 | Discharge: 2020-09-06 | Disposition: A | Discharge status: Home / Self Care | Payer: Medicare PPO | Source: Ambulatory Visit | Attending: Hematology | Admitting: Hematology

## 2020-09-06 ENCOUNTER — Encounter: Payer: Self-pay | Admitting: Hematology

## 2020-09-06 ENCOUNTER — Other Ambulatory Visit: Payer: Self-pay

## 2020-09-06 DIAGNOSIS — Z1231 Encounter for screening mammogram for malignant neoplasm of breast: Secondary | ICD-10-CM

## 2020-09-16 DIAGNOSIS — I700 Atherosclerosis of aorta: Secondary | ICD-10-CM | POA: Diagnosis not present

## 2020-09-16 DIAGNOSIS — E1149 Type 2 diabetes mellitus with other diabetic neurological complication: Secondary | ICD-10-CM | POA: Diagnosis not present

## 2020-09-16 DIAGNOSIS — C50919 Malignant neoplasm of unspecified site of unspecified female breast: Secondary | ICD-10-CM | POA: Diagnosis not present

## 2020-09-16 DIAGNOSIS — E785 Hyperlipidemia, unspecified: Secondary | ICD-10-CM | POA: Diagnosis not present

## 2020-09-16 DIAGNOSIS — G629 Polyneuropathy, unspecified: Secondary | ICD-10-CM | POA: Diagnosis not present

## 2020-09-16 DIAGNOSIS — I2510 Atherosclerotic heart disease of native coronary artery without angina pectoris: Secondary | ICD-10-CM | POA: Diagnosis not present

## 2020-09-16 DIAGNOSIS — I10 Essential (primary) hypertension: Secondary | ICD-10-CM | POA: Diagnosis not present

## 2020-09-16 DIAGNOSIS — E890 Postprocedural hypothyroidism: Secondary | ICD-10-CM | POA: Diagnosis not present

## 2020-09-16 DIAGNOSIS — M810 Age-related osteoporosis without current pathological fracture: Secondary | ICD-10-CM | POA: Diagnosis not present

## 2020-09-20 ENCOUNTER — Telehealth: Payer: Self-pay | Admitting: Pulmonary Disease

## 2020-09-20 NOTE — Telephone Encounter (Signed)
Called and spoke with patient. She stated that she has caught a summer cold from her grand children. She has had a cough and runny nose for the past 4 days. She wanted to be on the safe side and see if she needed to cancel her appt. I advised her that we can convert her visit to a televisit. She agreed. I have updated her appt notes.   Nothing further needed.

## 2020-09-23 ENCOUNTER — Encounter: Payer: Self-pay | Admitting: Pulmonary Disease

## 2020-09-23 ENCOUNTER — Ambulatory Visit (INDEPENDENT_AMBULATORY_CARE_PROVIDER_SITE_OTHER): Payer: Medicare PPO | Admitting: Pulmonary Disease

## 2020-09-23 ENCOUNTER — Other Ambulatory Visit: Payer: Self-pay

## 2020-09-23 DIAGNOSIS — R918 Other nonspecific abnormal finding of lung field: Secondary | ICD-10-CM | POA: Diagnosis not present

## 2020-09-23 NOTE — Progress Notes (Addendum)
Taylor Walsh    [id number]    [birth year]  Primary Care Physician:Shaw, Emily Filbert., MD  Referring Physician: Ginger Organ., MD [place],   [id number]  Virtual Visit via Telephone Note  I connected with Taylor Walsh on 09/23/20 at 11:45 AM EDT by telephone and verified that I am speaking with the correct person using two identifiers.  Location: Patient: Home Provider: [facility], [place], [place]   I discussed the limitations, risks, security and privacy concerns of performing an evaluation and management service by telephone and the availability of in person appointments. I also discussed with the patient that there may be a patient responsible charge related to this service. The patient expressed understanding and agreed to proceed.  Chief complaint: Follow-up for pulmonary amyloidosis  HPI: 65 year old with history of diabetes, pulmonary amyloidosis, breast cancer Follow patient of Dr. [place] Bells Pulmonary amyloidosis was diagnosed underwent open lung biopsy in 2016.  This was done for multiloculated lung density which turned out to be amyloidosis Here for routine visit.  States that her breathing is doing well with no issues Breast cancer was diagnosed in 2018 treated with left mastectomy, chemotherapy and radiation.  Pets: No pets Occupation: Retired Optometrist Exposures: No known exposures.  No mold, hot tub, Jacuzzi, no down pillows or comforters Smoking history: 40-pack-year smoker.  Quit in 2018 Travel history: No significant travel history Relevant family history: No significant family history of lung disease  Interim history: Overall doing well with regard to breathing Picked up a cold from her grandchildren, hands in person visit was converted to virtual  Denies any cough, mucus production, wheezing, fevers or chills  She is referred for low-dose screening CT of the chest at time of last visit but she did  not keep up the appointment  Outpatient Encounter Medications as of 09/23/2020  Medication Sig   albuterol (VENTOLIN HFA) 108 (90 Base) MCG/ACT inhaler Inhale 1-2 puffs into the lungs every 6 (six) hours as needed for wheezing or shortness of breath.   aspirin 81 MG tablet Take 81 mg by mouth daily.   atorvastatin (LIPITOR) 40 MG tablet Take 40 mg by mouth daily.   calcium-vitamin D 250-100 MG-UNIT tablet Take 2 tablets by mouth daily.    DULoxetine (CYMBALTA) 20 MG capsule Take 1 capsule (20 mg total) by mouth 2 (two) times daily.   ergocalciferol (VITAMIN D2) 50000 UNITS capsule Take 50,000 Units by mouth every Friday.    fluticasone (FLONASE) 50 MCG/ACT nasal spray fluticasone propionate 50 mcg/actuation nasal spray,suspension  USE 1 SPRAY(S) IN EACH NOSTRIL TWICE DAILY   ibuprofen (ADVIL,MOTRIN) 600 MG tablet Take 1 tablet (600 mg total) by mouth every 8 (eight) hours as needed. Take with food to protect stomach.   letrozole (FEMARA) 2.5 MG tablet Take 1 tablet (2.5 mg total) by mouth daily.   levothyroxine (SYNTHROID, LEVOTHROID) 75 MCG tablet    losartan (COZAAR) 50 MG tablet TAKE 1 TABLET BY MOUTH ONCE DAILY FOR 90 DAYS   metFORMIN (GLUCOPHAGE) 500 MG tablet TAKE 1 TABLET BY MOUTH TWICE DAILY WITH A MEAL (Patient taking differently: 1,000 mg 2 (two) times daily with a meal. TAKE 1 TABLET BY MOUTH TWICE DAILY WITH A MEAL)   prochlorperazine (COMPAZINE) 5 MG tablet Take 1 tablet (5 mg total) by mouth every 6 (six) hours as needed for nausea or vomiting.   valACYclovir (VALTREX) 1000 MG tablet Take 1,000 mg  by mouth daily.   [DISCONTINUED] traMADol (ULTRAM) 50 MG tablet Take 1 tablet (50 mg total) by mouth every 12 (twelve) hours as needed for moderate pain or severe pain.   No facility-administered encounter medications on file as of 09/23/2020.   Physical Exam: Telemetry  Data Reviewed: Imaging: CT chest 01/14/2015-40 mm lobulated density in the left lung apex.   PET scan  01/22/2015-left upper lobe density with SUV of 1.5.  Hypermetabolic tumor lesion in the lateral upper right chest. CT chest 11/18/2016-postsurgical changes in the left upper lobe. CT chest 03/27/2019-stable postsurgical changes in the left upper lobe. I have reviewed the images personally  PFTs: 12/14/2014 FVC 2.50 [98%], FEV1 2.21 [109%], F/F 38, TLC 4.24 [86%], DLCO 18 [78%]  Path Surgical pathology 02/25/2015-fibroelastic scar with amyloid deposition, associated giant cell reaction and osseous metaplasia.  Assessment:  Pulmonary nodule, amyloidosis Pulmonary nodule resected in 2016 with findings of scar tissue and amyloid deposition.  There is no evidence of systemic amyloidosis or more extensive involvement of the lung Review lung imaging on screening CTs of the chest  Ex-smoker Refer for low-dose screening CT of the chest  Plan/Recommendations: - Refer for screening CTs of the chest  Marshell Garfinkel MD  Pulmonary and Critical Care 09/23/2020, 11:44 AM  CC: Ginger Organ., MD

## 2020-09-23 NOTE — Addendum Note (Signed)
Addended by: Dessie Coma on: 09/23/2020 12:08 PM   Modules accepted: Orders

## 2020-09-23 NOTE — Patient Instructions (Signed)
I am glad you are doing well with regard to your breathing Refer you for low-dose screening CT of the chest  Follow-up in 1 year.

## 2020-09-27 ENCOUNTER — Encounter: Payer: Self-pay | Admitting: Hematology

## 2020-10-17 ENCOUNTER — Ambulatory Visit: Payer: Medicare PPO | Attending: Family

## 2020-10-17 DIAGNOSIS — Z23 Encounter for immunization: Secondary | ICD-10-CM

## 2020-10-17 NOTE — Progress Notes (Signed)
   Covid-19 Vaccination Clinic  Name:  GEARLDEAN MCCURTY    MRN: [id number] DOB: 08/28/1955  10/17/2020  Ms. Antu was observed post Covid-19 immunization for 15 minutes without incident. She was provided with Vaccine Information Sheet and instruction to access the V-Safe system.   Ms. Hanigan was instructed to call 911 with any severe reactions post vaccine: Difficulty breathing  Swelling of face and throat  A fast heartbeat  A bad rash all over body  Dizziness and weakness   Immunizations Administered     Name Date Dose VIS Date Route   Moderna Covid-19 Booster Vaccine 10/17/2020 11:15 AM 0.25 mL 01/17/2020 Intramuscular   Manufacturer: Moderna   Lot: [id number]   [id number]

## 2020-10-23 ENCOUNTER — Telehealth: Payer: Self-pay | Admitting: Nurse Practitioner

## 2020-10-23 NOTE — Telephone Encounter (Signed)
Left message with rescheduled upcoming appointment due to provider's template. 

## 2020-11-17 NOTE — Progress Notes (Signed)
Taylor Walsh   [phone] [phone]   Clinic Follow up Note   Patient Care Team: Ginger Organ., MD as PCP - General (Internal Medicine) Melrose Nakayama, MD as Consulting Physician (Cardiothoracic Surgery) Fanny Skates, MD as Consulting Physician (General Surgery) Truitt Merle, MD as Consulting Physician (Hematology) Delice Bison, Charlestine Massed, NP as Nurse Practitioner (Hematology and Oncology) Eppie Gibson, MD as Attending Physician (Radiation Oncology) 11/18/2020  CHIEF COMPLAINT: Follow up left breast cancer   SUMMARY OF ONCOLOGIC HISTORY: Oncology History Overview Note  Cancer Staging Breast cancer of upper-inner quadrant of left female breast [facility]) Staging form: Breast, AJCC 8th Edition - Clinical stage from 07/10/2016: Stage 0 (cTis (DCIS), cN0, cM0, G3, ER: Positive, PR: Positive, HER2: Not Assessed) - Signed by Truitt Merle, MD on 07/28/2016 - Pathologic stage from 10/26/2016: Stage IA (pT1a, pN1a, cM0, G2, ER: Positive, PR: Positive, HER2: Negative) - Signed by Truitt Merle, MD on 11/11/2016     Breast cancer of upper-inner quadrant of left female breast ([place])  07/10/2016 Initial Biopsy   Diagnosis 1. 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 2. 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   07/10/2016 Receptors her2   ER 95%, PR 80-90%+,  both strong staining, HER2 - (1+), Ki67 80-90%   07/22/2016 Initial Diagnosis   Ductal carcinoma in situ (DCIS) of left breast   07/27/2016 Imaging   Breast MRI w wo contrast showed  1. Extensive mass and non mass enhancement along the lower inner aspect the left breast, lower outer quadrant, spanning 10.2 cm from anterior to posterior, including both biopsied lesions, as detailed  above. This is all consistent with DCIS. 2. No other abnormal enhancement in the left breast. 3. No abnormal or enlarged axillary lymph nodes. 4. 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 The Hereditary Gene Panel offered by [facility] includes sequencing and/or deletion duplication testing of the following 46 genes: APC, ATM, AXIN2, BARD1, BMPR1A, BRCA1, BRCA2, BRIP1, CDH1, CDKN2A (p14ARF), CDKN2A (p16INK4a), CHEK2, CTNNA1, DICER1, EPCAM (Deletion/duplication testing only), GREM1 (promoter region deletion/duplication testing only), KIT, MEN1, MLH1, MSH2, MSH3, MSH6, MUTYH, NBN, NF1, NHTL1, PALB2, PDGFRA, PMS2, POLD1, POLE, PTEN, RAD50, RAD51C, RAD51D, SDHB, SDHC, SDHD, SMAD4, SMARCA4. STK11, TP53, TSC1, TSC2, and VHL.  The following gene was evaluated for sequence changes only: SDHA and HOXB13 c.251G>A variant only.  The report date is Aug 09, 2016. 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  The change in variant classification was made as a result of re-review of the evidence in light of new variant interpretation guidelines and /or new information. The updated report date is January 26, 2018.    10/13/2016 Surgery   LEFT TOTAL MASTECTOMY WITH LEFT AXILLARY SENTINEL LYMPH NODE BIOPSY By Dr. Ninfa Linden   10/13/2016 Pathology Results   Surgery Diagnosis 10/13/16  1. Lymph node, sentinel, biopsy, Left Axillary - METASTATIC CARCINOMA IN ONE LYMPH NODE (1/1). 2. Lymph node, sentinel, biopsy, Left - METASTATIC CARCINOMA IN ONE LYMPH NODE (1/1). 3. Breast, radical mastectomy (including lymph nodes), Left - INVASIVE DUCTAL CARCINOMA, 0.3 CM. - LYMPHOVASCULAR INVOLVEMENT BY CARCINOMA. - HIGH GRADE DUCTAL CARCINOMA IN SITU WITH CALCIFICATIONS AND NECROSIS, 5.5 CM. - DCIS FOCALLY 0.1  CM FROM CAUTERIZED MEDIAL MARGIN. - SEE ONCOLOGY TABLE AND COMMENT. 4. Skin , Left additional mastectomy  flap - BENIGN FIBROADIPOSE TISSUE. - NO EVIDENCE OF MALIGNANCY.   10/30/2016 Miscellaneous   MammaPrint 10/31/26 Result:  High Risk,Luminal type B MPI: -0.368 Average 10-year risk of recurrence untreated: 29%   11/18/2016 Imaging   CT CAP W Contrast 11/18/16 IMPRESSION: 1. Skin thickening and interstitial changes involving the left breast, likely related to radiation. Surgical changes are also noted with a tissue expander in place. Probable postop fluid collection in the upper outer quadrant breast. No enlarged supraclavicular or axillary lymph nodes. 2. No CT findings suggesting metastatic disease involving the chest, abdomen or pelvis or bony structures. 3. Surgical changes from a left upper lobe wedge resection. No findings for recurrent tumor.   11/23/2016 Imaging   Bone Scan Whole body 11/23/16 IMPRESSION: No definite scintigraphic evidence of osseous metastatic disease as above.   11/25/2016 - 04/07/2017 Chemotherapy   Adjuvant chemotherapy AC every 2 weeks for 4 cycles, 11/25/16-01/05/17, followed by Taxol weekly for 12 cycles starting 01/20/17    04/28/2017 - 06/11/2017 Radiation Therapy    1. Left Chest Wall / 50.4 Gy in 28 fractions 2. Left SCV and PAB / 50.4 Gy in 28 fractions  3. Left Chest Wall Boost / 10 Gy in 5 fractions    05/2017 -  Anti-estrogen oral therapy   Anastrozole initially, then changed to Letrozole in April 2019     CURRENT THERAPY: Anastrozole starting 05/2017, switched to Letrozole in 06/2017   INTERVAL HISTORY: Taylor Walsh returns for follow up as scheduled. She was last seen by Dr. Burr Medico 05/23/20. Mammogram 09/06/20 was negative.  She is doing well overall, denies change in her health since last visit.  She is tolerating letrozole.  Knee pain fluctuates with the weather.  She has occasional deep itch in her feet and abdomen she feels like she cannot relieve.  Her main complaint is persistent tingling in her fingertips, sometimes hands "go numb".  She was  hoping this was more from carpal tunnel but feels like it is chronic from chemo.  She did not like the way Cymbalta made her feel, and B complex did not help.  She is on gabapentin 3 times daily.  All other systems were reviewed with the patient and are negative.  MEDICAL HISTORY:  Past Medical History:  Diagnosis Date   Anxiety    Arthritis    Breast cancer ([place])    Cancer ([place]) 10/08/2016   left breast cancer   Diabetes mellitus without complication ([place])    Dyslipidemia    Family history of breast cancer    Family history of ovarian cancer    Family history of prostate cancer    GERD (gastroesophageal reflux disease)    nexium if needed   Heart murmur    History of radiation therapy 04/28/17- 06/11/17   Left Chest wall/ 50.4 Gy in 28 fractions. Left SCV/ 50.4 Gy in 28 fractions. Left Chest wall boost/ 10 Gy in 5 fractions.    Hypertension    Hypothyroid    Osteopenia    Pneumonia 10/08/2016   June 2013    SURGICAL HISTORY: Past Surgical History:  Procedure Laterality Date   ABDOMINAL HERNIA REPAIR  01/19/2019   ABDOMINAL HYSTERECTOMY     BREAST RECONSTRUCTION WITH PLACEMENT OF TISSUE EXPANDER AND FLEX HD (ACELLULAR HYDRATED DERMIS) Left 10/13/2016   Procedure: LEFT BREAST RECONSTRUCTION WITH PLACEMENT OF TISSUE EXPANDER AND ALLODERM;  Surgeon: Thimmappa,  Arnoldo Hooker, MD;  Location: [place];  Service: Plastics;  Laterality: Left;   FOOT SURGERY     bi-lat/ repaired hammer toes   MASTECTOMY Left 10/13/2016   MASTECTOMY W/ SENTINEL NODE BIOPSY Left 10/13/2016   MASTECTOMY W/ SENTINEL NODE BIOPSY Left 10/13/2016   Procedure: LEFT TOTAL MASTECTOMY WITH LEFT AXILLARY SENTINEL LYMPH NODE BIOPSY;  Surgeon: Coralie Keens, MD;  Location: [facility];  Service: General;  Laterality: Left;   PORTACATH PLACEMENT Right 11/24/2016   Procedure: INSERTION PORT-A-CATH;  Surgeon: Fanny Skates, MD;  Location: [facility];  Service: General;  Laterality: Right;   TUBAL LIGATION      VIDEO ASSISTED THORACOSCOPY (VATS)/WEDGE RESECTION Left 02/25/2015   Procedure: VIDEO ASSISTED THORACOSCOPY (VATS)/ LUL WEDGE RESECTION with ON Q placement.;  Surgeon: Melrose Nakayama, MD;  Location: [facility];  Service: Thoracic;  Laterality: Left;    I have reviewed the social history and family history with the patient and they are unchanged from previous note.  ALLERGIES:  is allergic to vicodin [hydrocodone-acetaminophen].  MEDICATIONS:  Current Outpatient Medications  Medication Sig Dispense Refill   albuterol (VENTOLIN HFA) 108 (90 Base) MCG/ACT inhaler Inhale 1-2 puffs into the lungs every 6 (six) hours as needed for wheezing or shortness of breath.     aspirin 81 MG tablet Take 81 mg by mouth daily.     atorvastatin (LIPITOR) 40 MG tablet Take 40 mg by mouth daily.     calcium-vitamin D 250-100 MG-UNIT tablet Take 2 tablets by mouth daily.      DULoxetine (CYMBALTA) 20 MG capsule Take 1 capsule (20 mg total) by mouth 2 (two) times daily. 60 capsule 5   ergocalciferol (VITAMIN D2) 50000 UNITS capsule Take 50,000 Units by mouth every Friday.      fluticasone (FLONASE) 50 MCG/ACT nasal spray fluticasone propionate 50 mcg/actuation nasal spray,suspension  USE 1 SPRAY(S) IN EACH NOSTRIL TWICE DAILY     ibuprofen (ADVIL,MOTRIN) 600 MG tablet Take 1 tablet (600 mg total) by mouth every 8 (eight) hours as needed. Take with food to protect stomach. 36 tablet 0   letrozole (FEMARA) 2.5 MG tablet Take 1 tablet (2.5 mg total) by mouth daily. 90 tablet 2   levothyroxine (SYNTHROID, LEVOTHROID) 75 MCG tablet   6   losartan (COZAAR) 50 MG tablet TAKE 1 TABLET BY MOUTH ONCE DAILY FOR 90 DAYS  1   metFORMIN (GLUCOPHAGE) 500 MG tablet TAKE 1 TABLET BY MOUTH TWICE DAILY WITH A MEAL (Patient taking differently: 1,000 mg 2 (two) times daily with a meal. TAKE 1 TABLET BY MOUTH TWICE DAILY WITH A MEAL) 60 tablet 2   prochlorperazine (COMPAZINE) 5 MG tablet Take 1 tablet (5 mg total) by mouth every 6 (six)  hours as needed for nausea or vomiting. 30 tablet 0   valACYclovir (VALTREX) 1000 MG tablet Take 1,000 mg by mouth daily.     No current facility-administered medications for this visit.    PHYSICAL EXAMINATION: ECOG PERFORMANCE STATUS: 1 - Symptomatic but completely ambulatory  Vitals:   11/18/20 1224  BP: 130/76  Pulse: 97  Resp: 18  Temp: 98.1 F (36.7 C)  SpO2: 99%   Filed Weights   11/18/20 1224  Weight: 178 lb 9 oz (81 kg)    GENERAL:alert, no distress and comfortable SKIN: No rash EYES: sclera clear NECK: without mass LYMPH:  no palpable cervical or supraclavicular lymphadenopathy  LUNGS: normal breathing effort HEART: no lower extremity edema Musculoskeletal: Nonfocal NEURO: alert & oriented x  3 with fluent speech, no focal motor deficits Breast exam: S/p left mastectomy and reconstruction.  Incisions completely healed.  No palpable mass along the chest wall, incision, implant, right breast, or either axilla that I could appreciate  LABORATORY DATA:  I have reviewed the data as listed CBC Latest Ref Rng & Units 11/18/2020 05/23/2020 12/06/2019  WBC 4.0 - 10.5 K/uL 3.9(L) 3.9(L) 5.4  Hemoglobin 12.0 - 15.0 g/dL 12.9 12.8 13.1  Hematocrit 36.0 - 46.0 % 39.6 41.0 41.1  Platelets 150 - 400 K/uL 255 269 293     CMP Latest Ref Rng & Units 11/18/2020 05/23/2020 12/06/2019  Glucose 70 - 99 mg/dL 143(H) 147(H) 108(H)  BUN 8 - 23 mg/dL _0 Creatinine 0.44 - 1.00 mg/dL 0.95 0.85 0.86  Sodium 135 - 145 mmol/L 140 140 137  Potassium 3.5 - 5.1 mmol/L 4.1 3.8 3.9  Chloride 98 - 111 mmol/L 105 106 101  CO2 22 - 32 mmol/L _1 Calcium 8.9 - 10.3 mg/dL 9.1 9.3 9.4  Total Protein 6.5 - 8.1 g/dL 7.4 7.6 8.1  Total Bilirubin 0.3 - 1.2 mg/dL 0.6 0.7 0.7  Alkaline Phos 38 - 126 U/L 68 60 79  AST 15 - 41 U/L _2 ALT 0 - 44 U/L _3 RADIOGRAPHIC STUDIES: I have personally reviewed the radiological images as listed and agreed with the findings in the  report. No results found.   ASSESSMENT & PLAN: Taylor Walsh is 65 y.o.    1. 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  Plan to continue for total 7-10 years -S/p right breast reduction 08/31/2018 for symmetry -Mammogram 08/2020 negative -Continue surveillance and AI  2.  CIPN -She has tried B complex vitamin and Cymbalta without much improvement -Partially managed on gabapentin 3 times daily but she does not take it consistently -Her symptoms are mainly tingling in the fingertips and occasional hand numbness -She is discouraged this will be a chronic side effect  -I discussed referral to neuro oncologist Dr. Mickeal Skinner for further eval and management, she will think about it  3. Arthritis -Mainly in her knees, fluctuates with weather -Stable overall  4. Osteopenia -Per PCP -Per patient recent DEXA was better on the right femur, but decreased in the left, continuing annual injections for 2 more years  5.  Genetics-VUS of ATM c.4066A>G classified as "likely benign"  6. H/o pulmonary amyloidosis -Found to have a lung nodule 12/2014, surgically removed, path showed pulmonary amyloidosis -Follow-up pulm   Disposition: Taylor Walsh is clinically doing well.  She is tolerating letrozole without significant toxicities.  Labs are stable, breast exam is unremarkable.  Right mammogram 08/2020 is negative.  Overall there is no clinical concern for breast cancer recurrence.    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  She is on gabapentin 3 times daily with minimal  response.  I offered referral to neuro oncologist Dr. Mickeal Skinner, she will think about it.  We discussed she is over 4 years from initial diagnosis, the recurrence risk has decreased. Continue surveillance and AI.  Return for routine surveillance visit in 6 months.  I encouraged her to continue healthy lifestyle, regular diet, physical exercise, limiting alcohol, avoiding tobacco, and staying up-to-date on other age-appropriate cancer screenings.  All questions were answered. The patient knows to call the clinic with any problems, questions or concerns. No barriers to learning were detected.  Total encounter time was 30 minutes.     Alla Feeling, NP 11/18/20

## 2020-11-18 ENCOUNTER — Inpatient Hospital Stay: Payer: Medicare PPO | Admitting: Nurse Practitioner

## 2020-11-18 ENCOUNTER — Inpatient Hospital Stay: Payer: Medicare PPO | Attending: Nurse Practitioner

## 2020-11-18 ENCOUNTER — Other Ambulatory Visit: Payer: Self-pay

## 2020-11-18 ENCOUNTER — Encounter: Payer: Self-pay | Admitting: Nurse Practitioner

## 2020-11-18 VITALS — BP 130/76 | HR 97 | Temp 98.1°F | Resp 18 | Wt 178.6 lb

## 2020-11-18 DIAGNOSIS — G620 Drug-induced polyneuropathy: Secondary | ICD-10-CM | POA: Insufficient documentation

## 2020-11-18 DIAGNOSIS — M8580 Other specified disorders of bone density and structure, unspecified site: Secondary | ICD-10-CM | POA: Insufficient documentation

## 2020-11-18 DIAGNOSIS — Z170 Estrogen receptor positive status [ER+]: Secondary | ICD-10-CM | POA: Insufficient documentation

## 2020-11-18 DIAGNOSIS — C50212 Malignant neoplasm of upper-inner quadrant of left female breast: Secondary | ICD-10-CM | POA: Insufficient documentation

## 2020-11-18 DIAGNOSIS — Z79811 Long term (current) use of aromatase inhibitors: Secondary | ICD-10-CM | POA: Diagnosis not present

## 2020-11-18 LAB — COMPREHENSIVE METABOLIC PANEL
ALT: 18 U/L (ref 0–44)
AST: 15 U/L (ref 15–41)
Albumin: 3.8 g/dL (ref 3.5–5.0)
Alkaline Phosphatase: 68 U/L (ref 38–126)
Anion gap: 10 (ref 5–15)
BUN: 11 mg/dL (ref 8–23)
CO2: 25 mmol/L (ref 22–32)
Calcium: 9.1 mg/dL (ref 8.9–10.3)
Chloride: 105 mmol/L (ref 98–111)
Creatinine, Ser: 0.95 mg/dL (ref 0.44–1.00)
GFR, Estimated: >60 mL/min (ref >60)
Glucose, Bld: 143 mg/dL — ABNORMAL HIGH (ref 70–99)
Potassium: 4.1 mmol/L (ref 3.5–5.1)
Sodium: 140 mmol/L (ref 135–145)
Total Bilirubin: 0.6 mg/dL (ref 0.3–1.2)
Total Protein: 7.4 g/dL (ref 6.5–8.1)

## 2020-11-18 LAB — CBC WITH DIFFERENTIAL/PLATELET
Abs Immature Granulocytes: 0.01 10*3/uL (ref 0.00–0.07)
Basophils Absolute: 0 10*3/uL (ref 0.0–0.1)
Basophils Relative: 1 %
Eosinophils Absolute: 0.1 10*3/uL (ref 0.0–0.5)
Eosinophils Relative: 2 %
HCT: 39.6 % (ref 36.0–46.0)
Hemoglobin: 12.9 g/dL (ref 12.0–15.0)
Immature Granulocytes: 0 %
Lymphocytes Relative: 37 %
Lymphs Abs: 1.4 10*3/uL (ref 0.7–4.0)
MCH: 27.5 pg (ref 26.0–34.0)
MCHC: 32.6 g/dL (ref 30.0–36.0)
MCV: 84.4 fL (ref 80.0–100.0)
Monocytes Absolute: 0.4 10*3/uL (ref 0.1–1.0)
Monocytes Relative: 10 %
Neutro Abs: 2 10*3/uL (ref 1.7–7.7)
Neutrophils Relative %: 50 %
Platelets: 255 10*3/uL (ref 150–400)
RBC: 4.69 MIL/uL (ref 3.87–5.11)
RDW: 15.7 % — ABNORMAL HIGH (ref 11.5–15.5)
WBC: 3.9 10*3/uL — ABNORMAL LOW (ref 4.0–10.5)
nRBC: 0 % (ref 0.0–0.2)

## 2020-11-19 ENCOUNTER — Telehealth: Payer: Self-pay | Admitting: Pulmonary Disease

## 2020-11-19 NOTE — Telephone Encounter (Signed)
ATC Patient.  No answer and mailbox is full.  Unable to leave VM.  Will try again at a later time.

## 2020-11-19 NOTE — Telephone Encounter (Signed)
Called and spoke to the pt and she stated that she was seen by PM back in June and he stated that he would set her up for CT scan.  She stated that she has not heard anything about this so she was calling to check in on this.  PM please advise of the CT scan you would like to have ordered.  Thanks

## 2020-11-19 NOTE — Telephone Encounter (Signed)
We had referred her for low-dose screening CT of the chest but with dx with Breast Ca in 06/2016, she will be eligible for lung cancer screening 06/2021   So we are planning to start the CTs in 4/20023.  Please have her call back if she does not hear about the scans by then

## 2020-11-20 NOTE — Telephone Encounter (Signed)
I spoke with the pt and notified of response per Dr Tilden Dome- do I need to put in a referral to the screening program for this? I can not tell that she has one in place already or you guys have her in some sort of reminder system. Thanks!

## 2020-11-21 ENCOUNTER — Ambulatory Visit: Payer: Medicare PPO | Admitting: Nurse Practitioner

## 2020-11-21 ENCOUNTER — Other Ambulatory Visit: Payer: Medicare PPO

## 2020-11-21 ENCOUNTER — Telehealth: Payer: Self-pay | Admitting: Hematology

## 2020-11-21 NOTE — Telephone Encounter (Signed)
Scheduled follow-up appointment per 8/25 los. Patient is aware. 

## 2020-11-21 NOTE — Telephone Encounter (Signed)
Pt's lung cancer screening referral is currently in my workque to be called closer to 06/2021 to schedule.

## 2020-11-27 NOTE — Telephone Encounter (Signed)
Hello Dr. Vaughan Browner, please advise on mychart message below, thanks!  Dr. Vaughan Browner,   I spoke with your nurse/assistant on August 24. The conversation was regarding a CT Image that I thought was due. She informed me that you stated something about 2018 and that I wasn't due for one until around April. I'm confused. I have had a CT Image every year since 2016 except last year because I fail to make an appointment. It is my understanding that this should happen every year because of the lung surgery I had in 2016 as well as my history of smoking. Can you clarify for me your thought process.   Thank you Taylor Walsh 1955-08-17 [phone]

## 2020-11-29 NOTE — Telephone Encounter (Signed)
I tried to call bu got voice message.   The scans we had referred for was for lung cancer screening which is a different type than those she got before. She is not eligible for these until 5 years from last cancer diagnosis. Since she had breast cancer in 2018 we will start these scans 5 years later, in early 2023. I can call her next week and talk in person if she has further questions.

## 2020-11-29 NOTE — Telephone Encounter (Signed)
Notified pt of Dr. Matilde Bash recommendations via My Chart message. Nothing further needed at this time.

## 2021-01-03 DIAGNOSIS — Z6831 Body mass index (BMI) 31.0-31.9, adult: Secondary | ICD-10-CM | POA: Diagnosis not present

## 2021-01-03 DIAGNOSIS — Z124 Encounter for screening for malignant neoplasm of cervix: Secondary | ICD-10-CM | POA: Diagnosis not present

## 2021-01-27 DIAGNOSIS — Z1152 Encounter for screening for COVID-19: Secondary | ICD-10-CM | POA: Diagnosis not present

## 2021-01-27 DIAGNOSIS — R6883 Chills (without fever): Secondary | ICD-10-CM | POA: Diagnosis not present

## 2021-01-27 DIAGNOSIS — I10 Essential (primary) hypertension: Secondary | ICD-10-CM | POA: Diagnosis not present

## 2021-01-27 DIAGNOSIS — R059 Cough, unspecified: Secondary | ICD-10-CM | POA: Diagnosis not present

## 2021-01-27 DIAGNOSIS — J069 Acute upper respiratory infection, unspecified: Secondary | ICD-10-CM | POA: Diagnosis not present

## 2021-01-27 DIAGNOSIS — R0981 Nasal congestion: Secondary | ICD-10-CM | POA: Diagnosis not present

## 2021-03-19 DIAGNOSIS — E039 Hypothyroidism, unspecified: Secondary | ICD-10-CM | POA: Diagnosis not present

## 2021-03-19 DIAGNOSIS — Z79811 Long term (current) use of aromatase inhibitors: Secondary | ICD-10-CM | POA: Diagnosis not present

## 2021-03-19 DIAGNOSIS — Z6833 Body mass index (BMI) 33.0-33.9, adult: Secondary | ICD-10-CM | POA: Diagnosis not present

## 2021-03-19 DIAGNOSIS — C50919 Malignant neoplasm of unspecified site of unspecified female breast: Secondary | ICD-10-CM | POA: Diagnosis not present

## 2021-03-19 DIAGNOSIS — J309 Allergic rhinitis, unspecified: Secondary | ICD-10-CM | POA: Diagnosis not present

## 2021-03-19 DIAGNOSIS — Z7982 Long term (current) use of aspirin: Secondary | ICD-10-CM | POA: Diagnosis not present

## 2021-03-19 DIAGNOSIS — E785 Hyperlipidemia, unspecified: Secondary | ICD-10-CM | POA: Diagnosis not present

## 2021-03-19 DIAGNOSIS — I10 Essential (primary) hypertension: Secondary | ICD-10-CM | POA: Diagnosis not present

## 2021-03-19 DIAGNOSIS — E669 Obesity, unspecified: Secondary | ICD-10-CM | POA: Diagnosis not present

## 2021-04-07 DIAGNOSIS — I10 Essential (primary) hypertension: Secondary | ICD-10-CM | POA: Diagnosis not present

## 2021-04-07 DIAGNOSIS — K921 Melena: Secondary | ICD-10-CM | POA: Diagnosis not present

## 2021-04-07 DIAGNOSIS — B351 Tinea unguium: Secondary | ICD-10-CM | POA: Diagnosis not present

## 2021-04-07 DIAGNOSIS — R110 Nausea: Secondary | ICD-10-CM | POA: Diagnosis not present

## 2021-04-08 ENCOUNTER — Other Ambulatory Visit: Payer: Self-pay | Admitting: Hematology

## 2021-04-08 DIAGNOSIS — Z170 Estrogen receptor positive status [ER+]: Secondary | ICD-10-CM

## 2021-04-14 ENCOUNTER — Ambulatory Visit: Payer: Medicare PPO | Admitting: Podiatry

## 2021-04-14 ENCOUNTER — Other Ambulatory Visit: Payer: Self-pay

## 2021-04-14 ENCOUNTER — Encounter: Payer: Self-pay | Admitting: Podiatry

## 2021-04-14 DIAGNOSIS — A6000 Herpesviral infection of urogenital system, unspecified: Secondary | ICD-10-CM | POA: Insufficient documentation

## 2021-04-14 DIAGNOSIS — L603 Nail dystrophy: Secondary | ICD-10-CM

## 2021-04-14 DIAGNOSIS — B351 Tinea unguium: Secondary | ICD-10-CM | POA: Diagnosis not present

## 2021-04-14 DIAGNOSIS — E785 Hyperlipidemia, unspecified: Secondary | ICD-10-CM | POA: Insufficient documentation

## 2021-04-14 DIAGNOSIS — E669 Obesity, unspecified: Secondary | ICD-10-CM | POA: Insufficient documentation

## 2021-04-14 MED ORDER — NEOMYCIN-POLYMYXIN-HC 1 % OT SOLN: OTIC | 0 refills | Status: DC

## 2021-04-14 NOTE — Progress Notes (Signed)
°  Subjective:  Patient ID: Taylor Walsh, female    DOB: Sep 26, 1955,  MRN: [id number]  Chief Complaint  Patient presents with   Nail Problem     NP Bilateral great toes, onychomycosis     66 y.o. female presents with the above complaint. History confirmed with patient.  She had chemotherapy 4 years ago for breast cancer, she had all the nails on the toes and hands and fingernails have regrown correctly all toenails except for the big toes regrew correctly but the big toenails grew back thick and brown discolored and have never been the same they point up and they grow out  Objective:  Physical Exam: warm, good capillary refill, no trophic changes or ulcerative lesions, normal DP and PT pulses, normal sensory exam, and severe dystrophy and mycosis with only about 50% of the nailbed with growth on bilateral hallux Assessment:   1. Onychomycosis   2. Nail dystrophy      Plan:  Patient was evaluated and treated and all questions answered.  Discussed with her she likely has accommodation of severe nail dystrophy and onychomycosis.  I am doubtful that treatment of onychomycosis alone would yield a positive result and likely would still have significant dystrophy left from this.  Discussed living with condition versus permanent total nail avulsion.  She elected for permanent removal of the nails and her pedicurist is able to create a false nail plates over the avulsion site later this summer.  Following sterile prep with Betadine and digital block of each hallux I remove the nail plate in its entirety and applied 3 applications each of phenol to the nailbed and matrix for matricectomy.  I will see her back in 2 weeks for follow-up so the instructions were given and Cortisporin sent to pharmacy  Return in about 2 weeks (around 04/28/2021) for bilateral nail checck.

## 2021-04-14 NOTE — Patient Instructions (Signed)

## 2021-04-16 ENCOUNTER — Telehealth: Payer: Self-pay | Admitting: Podiatry

## 2021-04-16 NOTE — Telephone Encounter (Signed)
Pt had both hallux toenails removed Monday and wants to know when or if she can take a shower as she is doing her nail soaks.

## 2021-04-17 NOTE — Telephone Encounter (Signed)
Notified patient of being able to take a shower at any time

## 2021-04-22 DIAGNOSIS — I10 Essential (primary) hypertension: Secondary | ICD-10-CM | POA: Diagnosis not present

## 2021-04-22 DIAGNOSIS — E890 Postprocedural hypothyroidism: Secondary | ICD-10-CM | POA: Diagnosis not present

## 2021-04-22 DIAGNOSIS — E785 Hyperlipidemia, unspecified: Secondary | ICD-10-CM | POA: Diagnosis not present

## 2021-04-22 DIAGNOSIS — E1149 Type 2 diabetes mellitus with other diabetic neurological complication: Secondary | ICD-10-CM | POA: Diagnosis not present

## 2021-04-22 DIAGNOSIS — E859 Amyloidosis, unspecified: Secondary | ICD-10-CM | POA: Diagnosis not present

## 2021-04-22 DIAGNOSIS — I2510 Atherosclerotic heart disease of native coronary artery without angina pectoris: Secondary | ICD-10-CM | POA: Diagnosis not present

## 2021-04-22 DIAGNOSIS — U071 COVID-19: Secondary | ICD-10-CM | POA: Diagnosis not present

## 2021-04-22 DIAGNOSIS — I700 Atherosclerosis of aorta: Secondary | ICD-10-CM | POA: Diagnosis not present

## 2021-04-22 DIAGNOSIS — Z1331 Encounter for screening for depression: Secondary | ICD-10-CM | POA: Diagnosis not present

## 2021-04-22 DIAGNOSIS — C50919 Malignant neoplasm of unspecified site of unspecified female breast: Secondary | ICD-10-CM | POA: Diagnosis not present

## 2021-04-22 DIAGNOSIS — Z0000 Encounter for general adult medical examination without abnormal findings: Secondary | ICD-10-CM | POA: Diagnosis not present

## 2021-04-22 DIAGNOSIS — Z1339 Encounter for screening examination for other mental health and behavioral disorders: Secondary | ICD-10-CM | POA: Diagnosis not present

## 2021-05-07 ENCOUNTER — Telehealth: Payer: Self-pay | Admitting: Hematology

## 2021-05-07 NOTE — Telephone Encounter (Signed)
Rescheduled upcoming appointment due to overbooked provider. Patient is aware of changes.

## 2021-05-12 ENCOUNTER — Other Ambulatory Visit: Payer: Self-pay

## 2021-05-12 ENCOUNTER — Encounter: Payer: Self-pay | Admitting: Podiatry

## 2021-05-12 ENCOUNTER — Ambulatory Visit: Payer: Medicare PPO | Admitting: Podiatry

## 2021-05-12 DIAGNOSIS — L603 Nail dystrophy: Secondary | ICD-10-CM

## 2021-05-12 DIAGNOSIS — B351 Tinea unguium: Secondary | ICD-10-CM | POA: Diagnosis not present

## 2021-05-12 NOTE — Progress Notes (Signed)
°  Subjective:  Patient ID: Taylor Walsh, female    DOB: 12/19/1955,  MRN: [id number]  Chief Complaint  Patient presents with   Nail Problem       2 week nail check bil hallux    66 y.o. female presents with the above complaint. History confirmed with patient.  Doing well has some sensitivity on the right side there is some drainage on the right as well she is still been bandaging them daily  Objective:  Physical Exam: warm, good capillary refill, no trophic changes or ulcerative lesions, normal DP and PT pulses, normal sensory exam, matricectomy sites are healing well Assessment:   1. Onychomycosis   2. Nail dystrophy      Plan:  Patient was evaluated and treated and all questions answered.  Doing well she has some drainage on the right hallux nail advised her to leave open to air discontinue soaks and ointment and should scab up and heal on its own at this point.  Return if symptoms worsen or fail to improve.

## 2021-05-16 ENCOUNTER — Inpatient Hospital Stay (HOSPITAL_BASED_OUTPATIENT_CLINIC_OR_DEPARTMENT_OTHER): Payer: Medicare PPO | Admitting: Hematology

## 2021-05-16 ENCOUNTER — Other Ambulatory Visit: Payer: Self-pay

## 2021-05-16 ENCOUNTER — Encounter: Payer: Self-pay | Admitting: Hematology

## 2021-05-16 ENCOUNTER — Inpatient Hospital Stay: Payer: Medicare PPO | Attending: Hematology

## 2021-05-16 VITALS — BP 127/73 | HR 94 | Temp 98.7°F | Resp 18 | Wt 179.2 lb

## 2021-05-16 DIAGNOSIS — Z79811 Long term (current) use of aromatase inhibitors: Secondary | ICD-10-CM | POA: Insufficient documentation

## 2021-05-16 DIAGNOSIS — C50212 Malignant neoplasm of upper-inner quadrant of left female breast: Secondary | ICD-10-CM | POA: Diagnosis not present

## 2021-05-16 DIAGNOSIS — Z79899 Other long term (current) drug therapy: Secondary | ICD-10-CM | POA: Diagnosis not present

## 2021-05-16 DIAGNOSIS — G620 Drug-induced polyneuropathy: Secondary | ICD-10-CM | POA: Insufficient documentation

## 2021-05-16 DIAGNOSIS — Z9012 Acquired absence of left breast and nipple: Secondary | ICD-10-CM | POA: Diagnosis not present

## 2021-05-16 DIAGNOSIS — M8580 Other specified disorders of bone density and structure, unspecified site: Secondary | ICD-10-CM | POA: Diagnosis not present

## 2021-05-16 DIAGNOSIS — Z9221 Personal history of antineoplastic chemotherapy: Secondary | ICD-10-CM | POA: Diagnosis not present

## 2021-05-16 DIAGNOSIS — G629 Polyneuropathy, unspecified: Secondary | ICD-10-CM | POA: Insufficient documentation

## 2021-05-16 DIAGNOSIS — T451X5A Adverse effect of antineoplastic and immunosuppressive drugs, initial encounter: Secondary | ICD-10-CM | POA: Diagnosis not present

## 2021-05-16 DIAGNOSIS — Z170 Estrogen receptor positive status [ER+]: Secondary | ICD-10-CM

## 2021-05-16 DIAGNOSIS — Z923 Personal history of irradiation: Secondary | ICD-10-CM | POA: Insufficient documentation

## 2021-05-16 DIAGNOSIS — M1990 Unspecified osteoarthritis, unspecified site: Secondary | ICD-10-CM | POA: Insufficient documentation

## 2021-05-16 DIAGNOSIS — Z8709 Personal history of other diseases of the respiratory system: Secondary | ICD-10-CM | POA: Diagnosis not present

## 2021-05-16 LAB — COMPREHENSIVE METABOLIC PANEL
ALT: 18 U/L (ref 0–44)
AST: 17 U/L (ref 15–41)
Albumin: 4.3 g/dL (ref 3.5–5.0)
Alkaline Phosphatase: 52 U/L (ref 38–126)
Anion gap: 8 (ref 5–15)
BUN: 13 mg/dL (ref 8–23)
CO2: 26 mmol/L (ref 22–32)
Calcium: 9.7 mg/dL (ref 8.9–10.3)
Chloride: 105 mmol/L (ref 98–111)
Creatinine, Ser: 0.82 mg/dL (ref 0.44–1.00)
GFR, Estimated: >60 mL/min (ref >60)
Glucose, Bld: 181 mg/dL — ABNORMAL HIGH (ref 70–99)
Potassium: 4 mmol/L (ref 3.5–5.1)
Sodium: 139 mmol/L (ref 135–145)
Total Bilirubin: 0.8 mg/dL (ref 0.3–1.2)
Total Protein: 7.5 g/dL (ref 6.5–8.1)

## 2021-05-16 LAB — CBC WITH DIFFERENTIAL/PLATELET
Abs Immature Granulocytes: 0 10*3/uL (ref 0.00–0.07)
Basophils Absolute: 0 10*3/uL (ref 0.0–0.1)
Basophils Relative: 0 %
Eosinophils Absolute: 0.1 10*3/uL (ref 0.0–0.5)
Eosinophils Relative: 3 %
HCT: 39.8 % (ref 36.0–46.0)
Hemoglobin: 13 g/dL (ref 12.0–15.0)
Immature Granulocytes: 0 %
Lymphocytes Relative: 41 %
Lymphs Abs: 1.7 10*3/uL (ref 0.7–4.0)
MCH: 27.5 pg (ref 26.0–34.0)
MCHC: 32.7 g/dL (ref 30.0–36.0)
MCV: 84.1 fL (ref 80.0–100.0)
Monocytes Absolute: 0.6 10*3/uL (ref 0.1–1.0)
Monocytes Relative: 14 %
Neutro Abs: 1.7 10*3/uL (ref 1.7–7.7)
Neutrophils Relative %: 42 %
Platelets: 268 10*3/uL (ref 150–400)
RBC: 4.73 MIL/uL (ref 3.87–5.11)
RDW: 15.5 % (ref 11.5–15.5)
WBC: 4.1 10*3/uL (ref 4.0–10.5)
nRBC: 0 % (ref 0.0–0.2)

## 2021-05-16 NOTE — Progress Notes (Addendum)
Taylor Walsh   Telephone:([phone]) [phone] Fax:(336) [phone]   Clinic Follow up Note   Patient Care Team: Ginger Organ., MD as PCP - General (Internal Medicine) Melrose Nakayama, MD as Consulting Physician (Cardiothoracic Surgery) Fanny Skates, MD as Consulting Physician (General Surgery) Truitt Merle, MD as Consulting Physician (Hematology) Delice Bison Charlestine Massed, NP as Nurse Practitioner (Hematology and Oncology) Eppie Gibson, MD as Attending Physician (Radiation Oncology)  Date of Service:  05/16/2021  CHIEF COMPLAINT: f/u of left breast cancer  CURRENT THERAPY:  Anastrozole starting 05/2017, switched to Letrozole in 06/2017   ASSESSMENT & PLAN:  Taylor Walsh is a 66 y.o. female with   1. 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  Plan to continue for total 7 years -S/p right breast reduction 08/31/2018 for symmetry -from a breast cancer standpoint, she is clinically doing well. Labs reviewed, overall no concern. Physical exam was unremarkable. There is no clinical concern for recurrence. 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 Mickeal Skinner for further eval and management, she will think about it   3. Arthritis -Mainly in her knees, fluctuates with weather -Stable  overall   4. Osteopenia -Per PCP -Per patient recent DEXA was better on the right femur, but decreased in the left, continuing annual injections for 2 more years   5.  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 H/o pulmonary amyloidosis -Found to have a lung nodule 12/2014, surgically removed, path showed pulmonary amyloidosis -Follow-up with pulmonary clinic    PLAN: -mammogram due 08/2021, I ordered today -lab and f/u with NP Lacie in 6 months -continue anastrozole   Addendum -I reviewed her recent DEXA from 09/16/2020 which showed osteopenia wo high risk for fracture. She is on Reclast by PCP  No problem-specific Assessment & Plan notes found for this encounter.   SUMMARY OF ONCOLOGIC HISTORY: Oncology History Overview Note  Cancer Staging Breast cancer of upper-inner quadrant of left female breast [facility] Staging form: Breast, AJCC 8th Edition - Clinical stage from 07/10/2016: Stage 0 (cTis (DCIS), cN0, cM0, G3, ER: Positive, PR: Positive, HER2: Not Assessed) - Signed by Truitt Merle, MD on 07/28/2016 - Pathologic stage from 10/26/2016: Stage IA (pT1a, pN1a, cM0, G2, ER: Positive, PR: Positive, HER2: Negative) - Signed by Truitt Merle, MD on 11/11/2016     Breast cancer of upper-inner quadrant of left female breast ([place])  07/10/2016 Initial Biopsy   Diagnosis 1. 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 2. 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   07/10/2016 Receptors her2   ER 95%, PR 80-90%+,  both strong staining, HER2 - (1+), Ki67 80-90%   07/22/2016 Initial Diagnosis   Ductal carcinoma in  situ (DCIS) of left breast   07/27/2016 Imaging   Breast MRI w wo contrast showed  1. Extensive mass and non mass enhancement along the lower inner  aspect the left breast, lower outer quadrant, spanning 10.2 cm from anterior to posterior, including both biopsied lesions, as detailed above. This is all consistent with DCIS. 2. No other abnormal enhancement in the left breast. 3. No abnormal or enlarged axillary lymph nodes. 4. 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 The Hereditary Gene Panel offered by [facility] includes sequencing and/or deletion duplication testing of the following 46 genes: APC, ATM, AXIN2, BARD1, BMPR1A, BRCA1, BRCA2, BRIP1, CDH1, CDKN2A (p14ARF), CDKN2A (p16INK4a), CHEK2, CTNNA1, DICER1, EPCAM (Deletion/duplication testing only), GREM1 (promoter region deletion/duplication testing only), KIT, MEN1, MLH1, MSH2, MSH3, MSH6, MUTYH, NBN, NF1, NHTL1, PALB2, PDGFRA, PMS2, POLD1, POLE, PTEN, RAD50, RAD51C, RAD51D, SDHB, SDHC, SDHD, SMAD4, SMARCA4. STK11, TP53, TSC1, TSC2, and VHL.  The following gene was evaluated for sequence changes only: SDHA and HOXB13 c.251G>A variant only.  The report date is Aug 09, 2016. 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  The change in variant classification was made as a result of re-review of the evidence in light of new variant interpretation guidelines and /or new information. The updated report date is January 26, 2018.    10/13/2016 Surgery   LEFT TOTAL MASTECTOMY WITH LEFT AXILLARY SENTINEL LYMPH NODE BIOPSY By Dr. Ninfa Linden   10/13/2016 Pathology Results   Surgery Diagnosis 10/13/16  1. Lymph node, sentinel, biopsy, Left Axillary - METASTATIC CARCINOMA IN ONE LYMPH NODE (1/1). 2. Lymph node, sentinel, biopsy, Left - METASTATIC CARCINOMA IN ONE LYMPH NODE (1/1). 3. Breast, radical mastectomy (including lymph nodes), Left - INVASIVE DUCTAL CARCINOMA, 0.3 CM. - LYMPHOVASCULAR INVOLVEMENT BY CARCINOMA. - HIGH GRADE DUCTAL CARCINOMA IN SITU WITH CALCIFICATIONS AND  NECROSIS, 5.5 CM. - DCIS FOCALLY 0.1 CM FROM CAUTERIZED MEDIAL MARGIN. - SEE ONCOLOGY TABLE AND COMMENT. 4. Skin , Left additional mastectomy flap - BENIGN FIBROADIPOSE TISSUE. - NO EVIDENCE OF MALIGNANCY.   10/30/2016 Miscellaneous   MammaPrint 10/31/26 Result:  High Risk,Luminal type B MPI: -0.368 Average 10-year risk of recurrence untreated: 29%   11/18/2016 Imaging   CT CAP W Contrast 11/18/16 IMPRESSION: 1. Skin thickening and interstitial changes involving the left breast, likely related to radiation. Surgical changes are also noted with a tissue expander in place. Probable postop fluid collection in the upper outer quadrant breast. No enlarged supraclavicular or axillary lymph nodes. 2. No CT findings suggesting metastatic disease involving the chest, abdomen or pelvis or bony structures. 3. Surgical changes from a left upper lobe wedge resection. No findings for recurrent tumor.   11/23/2016 Imaging   Bone Scan Whole body 11/23/16 IMPRESSION: No definite scintigraphic evidence of osseous metastatic disease as above.   11/25/2016 - 04/07/2017 Chemotherapy   Adjuvant chemotherapy AC every 2 weeks for 4 cycles, 11/25/16-01/05/17, followed by Taxol weekly for 12 cycles starting 01/20/17    04/28/2017 - 06/11/2017 Radiation Therapy    1. Left Chest Wall / 50.4 Gy in 28 fractions 2. Left SCV and PAB / 50.4 Gy in 28 fractions  3. Left Chest Wall Boost / 10 Gy in 5 fractions    05/2017 -  Anti-estrogen oral therapy   Anastrozole initially, then changed to Letrozole in April 2019      INTERVAL HISTORY:  Taylor Walsh is  here for a follow up of breast cancer. She was last seen by NP Lacie on 11/18/20. She presents to the clinic alone. She reports her right knee is "giving me a fit;" she states it aches a majority of the time, worse depending on the weather (it is raining today, so it is bothering her more today).  She also reports continued neuropathy in her hands and feet,  especially numbness in her hands where she can't feel when she's holding something.   All other systems were reviewed with the patient and are negative.  MEDICAL HISTORY:  Past Medical History:  Diagnosis Date   Anxiety    Arthritis    Breast cancer ([place])    Cancer ([place]) 10/08/2016   left breast cancer   Diabetes mellitus without complication ([place])    Dyslipidemia    Family history of breast cancer    Family history of ovarian cancer    Family history of prostate cancer    GERD (gastroesophageal reflux disease)    nexium if needed   Heart murmur    History of radiation therapy 04/28/17- 06/11/17   Left Chest wall/ 50.4 Gy in 28 fractions. Left SCV/ 50.4 Gy in 28 fractions. Left Chest wall boost/ 10 Gy in 5 fractions.    Hypertension    Hypothyroid    Osteopenia    Osteoporosis 11/06/2019   Pneumonia 10/08/2016   June 2013    SURGICAL HISTORY: Past Surgical History:  Procedure Laterality Date   ABDOMINAL HERNIA REPAIR  01/19/2019   ABDOMINAL HYSTERECTOMY     BREAST RECONSTRUCTION WITH PLACEMENT OF TISSUE EXPANDER AND FLEX HD (ACELLULAR HYDRATED DERMIS) Left 10/13/2016   Procedure: LEFT BREAST RECONSTRUCTION WITH PLACEMENT OF TISSUE EXPANDER AND ALLODERM;  Surgeon: Irene Limbo, MD;  Location: [facility];  Service: Plastics;  Laterality: Left;   FOOT SURGERY     bi-lat/ repaired hammer toes   MASTECTOMY Left 10/13/2016   MASTECTOMY W/ SENTINEL NODE BIOPSY Left 10/13/2016   MASTECTOMY W/ SENTINEL NODE BIOPSY Left 10/13/2016   Procedure: LEFT TOTAL MASTECTOMY WITH LEFT AXILLARY SENTINEL LYMPH NODE BIOPSY;  Surgeon: Coralie Keens, MD;  Location: [facility];  Service: General;  Laterality: Left;   PORTACATH PLACEMENT Right 11/24/2016   Procedure: INSERTION PORT-A-CATH;  Surgeon: Fanny Skates, MD;  Location: [facility];  Service: General;  Laterality: Right;   TUBAL LIGATION     VIDEO ASSISTED THORACOSCOPY (VATS)/WEDGE RESECTION Left 02/25/2015   Procedure: VIDEO  ASSISTED THORACOSCOPY (VATS)/ LUL WEDGE RESECTION with ON Q placement.;  Surgeon: Melrose Nakayama, MD;  Location: [facility];  Service: Thoracic;  Laterality: Left;    I have reviewed the social history and family history with the patient and they are unchanged from previous note.  ALLERGIES:  is allergic to vicodin [hydrocodone-acetaminophen].  MEDICATIONS:  Current Outpatient Medications  Medication Sig Dispense Refill   albuterol (VENTOLIN HFA) 108 (90 Base) MCG/ACT inhaler Inhale 1-2 puffs into the lungs every 6 (six) hours as needed for wheezing or shortness of breath.     aspirin 81 MG tablet Take 81 mg by mouth daily.     atorvastatin (LIPITOR) 40 MG tablet Take 40 mg by mouth daily.     calcium-vitamin D 250-100 MG-UNIT tablet Take 2 tablets by mouth daily.      DULoxetine (CYMBALTA) 20 MG capsule Take 1 capsule (20 mg total) by mouth 2 (two) times daily. 60 capsule 5   ergocalciferol (VITAMIN D2) 50000 UNITS capsule Take 50,000 Units by mouth  every Friday.      fluticasone (FLONASE) 50 MCG/ACT nasal spray fluticasone propionate 50 mcg/actuation nasal spray,suspension  USE 1 SPRAY(S) IN EACH NOSTRIL TWICE DAILY     gabapentin (NEURONTIN) 300 MG capsule gabapentin 300 mg capsule  TAKE 1 CAPSULE BY MOUTH EVERY 8 HOURS     ibuprofen (ADVIL,MOTRIN) 600 MG tablet Take 1 tablet (600 mg total) by mouth every 8 (eight) hours as needed. Take with food to protect stomach. 36 tablet 0   letrozole (FEMARA) 2.5 MG tablet Take 1 tablet by mouth once daily 90 tablet 0   levothyroxine (SYNTHROID, LEVOTHROID) 75 MCG tablet   6   losartan (COZAAR) 50 MG tablet TAKE 1 TABLET BY MOUTH ONCE DAILY FOR 90 DAYS  1   metFORMIN (GLUCOPHAGE) 500 MG tablet TAKE 1 TABLET BY MOUTH TWICE DAILY WITH A MEAL (Patient taking differently: 1,000 mg 2 (two) times daily with a meal. TAKE 1 TABLET BY MOUTH TWICE DAILY WITH A MEAL) 60 tablet 2   NEOMYCIN-POLYMYXIN-HYDROCORTISONE (CORTISPORIN) 1 % SOLN OTIC solution Apply to  nail beds from procedure site twice daily after soaks 10 mL 0   prochlorperazine (COMPAZINE) 5 MG tablet Take 1 tablet (5 mg total) by mouth every 6 (six) hours as needed for nausea or vomiting. 30 tablet 0   valACYclovir (VALTREX) 1000 MG tablet Take 1,000 mg by mouth daily.     zoledronic acid (RECLAST) 5 MG/100ML SOLN injection Reclast 5 mg/100 mL intravenous piggyback  Inject by intravenous route.     No current facility-administered medications for this visit.    PHYSICAL EXAMINATION: ECOG PERFORMANCE STATUS: 0 - Asymptomatic  Vitals:   05/16/21 0831  BP: 127/73  Pulse: 94  Resp: 18  Temp: 98.7 F (37.1 C)  SpO2: 97%   Wt Readings from Last 3 Encounters:  05/16/21 179 lb 4 oz (81.3 kg)  11/18/20 178 lb 9 oz (81 kg)  05/23/20 186 lb 12.8 oz (84.7 kg)     GENERAL:alert, no distress and comfortable SKIN: skin color, texture, turgor are normal, no rashes or significant lesions EYES: normal, Conjunctiva are pink and non-injected, sclera clear  NECK: supple, thyroid normal size, non-tender, without nodularity LYMPH:  no palpable lymphadenopathy in the cervical, axillary  LUNGS: clear to auscultation and percussion with normal breathing effort HEART: regular rate & rhythm and no murmurs and no lower extremity edema ABDOMEN:abdomen soft, non-tender and normal bowel sounds Musculoskeletal:no cyanosis of digits and no clubbing  NEURO: alert & oriented x 3 with fluent speech, no focal motor/sensory deficits BREAST: No palpable mass, nodules or adenopathy bilaterally. Breast exam benign.   LABORATORY DATA:  I have reviewed the data as listed CBC Latest Ref Rng & Units 05/16/2021 11/18/2020 05/23/2020  WBC 4.0 - 10.5 K/uL 4.1 3.9(L) 3.9(L)  Hemoglobin 12.0 - 15.0 g/dL 13.0 12.9 12.8  Hematocrit 36.0 - 46.0 % 39.8 39.6 41.0  Platelets 150 - 400 K/uL 268 255 269     CMP Latest Ref Rng & Units 11/18/2020 05/23/2020 12/06/2019  Glucose 70 - 99 mg/dL 143(H) 147(H) 108(H)  BUN 8 - 23  mg/dL _0 Creatinine 0.44 - 1.00 mg/dL 0.95 0.85 0.86  Sodium 135 - 145 mmol/L 140 140 137  Potassium 3.5 - 5.1 mmol/L 4.1 3.8 3.9  Chloride 98 - 111 mmol/L 105 106 101  CO2 22 - 32 mmol/L _1 Calcium 8.9 - 10.3 mg/dL 9.1 9.3 9.4  Total Protein 6.5 - 8.1 g/dL 7.4 7.6 8.1  Total Bilirubin 0.3 - 1.2 mg/dL 0.6 0.7 0.7  Alkaline Phos 38 - 126 U/L 68 60 79  AST 15 - 41 U/L _0 ALT 0 - 44 U/L _1 RADIOGRAPHIC STUDIES: I have personally reviewed the radiological images as listed and agreed with the findings in the report. No results found.    Orders Placed This Encounter  Procedures   MM Digital Screening Unilat R    Standing Status:   Future    Standing Expiration Date:   05/16/2022    Order Specific Question:   Reason for Exam (SYMPTOM  OR DIAGNOSIS REQUIRED)    Answer:   screening    Order Specific Question:   Preferred imaging location?    Answer:   [facility]   All questions were answered. The patient knows to call the clinic with any problems, questions or concerns. No barriers to learning was detected. The total time spent in the appointment was 30 minutes.     Truitt Merle, MD 05/16/2021   I, Wilburn Mylar, am acting as scribe for Truitt Merle, MD.   I have reviewed the above documentation for accuracy and completeness, and I agree with the above.

## 2021-05-17 ENCOUNTER — Encounter: Payer: Self-pay | Admitting: Hematology

## 2021-05-19 NOTE — Progress Notes (Signed)
This nurse faxed office visit notes from MD  to Dr. Brigitte Pulse and requested Dexa report and Office visit notes from 09/16/2020.  No further concerns at this time.

## 2021-05-21 ENCOUNTER — Inpatient Hospital Stay: Payer: Medicare PPO

## 2021-05-21 ENCOUNTER — Inpatient Hospital Stay: Payer: Medicare PPO | Admitting: Hematology

## 2021-05-23 DIAGNOSIS — M1711 Unilateral primary osteoarthritis, right knee: Secondary | ICD-10-CM | POA: Diagnosis not present

## 2021-07-03 ENCOUNTER — Other Ambulatory Visit: Payer: Self-pay | Admitting: Hematology

## 2021-07-03 DIAGNOSIS — C50212 Malignant neoplasm of upper-inner quadrant of left female breast: Secondary | ICD-10-CM

## 2021-09-08 ENCOUNTER — Ambulatory Visit
Admission: RE | Admit: 2021-09-08 | Discharge: 2021-09-08 | Disposition: A | Discharge status: Home / Self Care | Payer: Medicare PPO | Source: Ambulatory Visit | Attending: Hematology | Admitting: Hematology

## 2021-09-08 DIAGNOSIS — C50212 Malignant neoplasm of upper-inner quadrant of left female breast: Secondary | ICD-10-CM

## 2021-09-08 DIAGNOSIS — Z1231 Encounter for screening mammogram for malignant neoplasm of breast: Secondary | ICD-10-CM | POA: Diagnosis not present

## 2021-09-23 ENCOUNTER — Other Ambulatory Visit: Payer: Self-pay

## 2021-09-23 DIAGNOSIS — Z122 Encounter for screening for malignant neoplasm of respiratory organs: Secondary | ICD-10-CM

## 2021-09-23 DIAGNOSIS — Z87891 Personal history of nicotine dependence: Secondary | ICD-10-CM

## 2021-09-26 ENCOUNTER — Other Ambulatory Visit: Payer: Self-pay | Admitting: Hematology

## 2021-09-26 DIAGNOSIS — C50212 Malignant neoplasm of upper-inner quadrant of left female breast: Secondary | ICD-10-CM

## 2021-10-22 ENCOUNTER — Ambulatory Visit (INDEPENDENT_AMBULATORY_CARE_PROVIDER_SITE_OTHER): Payer: Medicare PPO | Admitting: Acute Care

## 2021-10-22 ENCOUNTER — Encounter: Payer: Self-pay | Admitting: Acute Care

## 2021-10-22 DIAGNOSIS — Z87891 Personal history of nicotine dependence: Secondary | ICD-10-CM

## 2021-10-22 NOTE — Patient Instructions (Signed)
Thank you for participating in the [place] [facility] Scre[facility]. It was our pleasure to meet you today. We will call you with the results of your scan within the next few days. Your scan will be assigned a Lung RADS category score by the physicians reading the scans.  This Lung RADS score determines follow up scanning.  See below for description of categories, and follow up screening recommendations. We will be in touch to schedule your follow up screening annually or based on recommendations of our providers. We will fax a copy of your scan results to your Primary Care Physician, or the physician who referred you to the program, to ensure they have the results. Please call the office if you have any questions or concerns regarding your scanning experience or results.  Our office number is [phone]. Please speak with Denise Phelps, RN. , or  Denise Buckner RN, They are  our Lung Cancer Screening RN.'s If They are unavailable when you call, Please leave a message on the voice mail. We will return your call at our earliest convenience.This voice mail is monitored several times a day.  Remember, if your scan is normal, we will scan you annually as long as you continue to meet the criteria for the program. (Age 55-77, Current smoker or smoker who has quit within the last 15 years). If you are a smoker, remember, quitting is the single most powerful action that you can take to decrease your risk of lung cancer and other pulmonary, breathing related problems. We know quitting is hard, and we are here to help.  Please let us know if there is anything we can do to help you meet your goal of quitting. If you are a former smoker, congratulations. We are proud of you! Remain smoke free! Remember you can refer friends or family members through the number above.  We will screen them to make sure they meet criteria for the program. Thank you for helping us take better care of you by  participating in Lung Screening.  You can receive free nicotine replacement therapy ( patches, gum or mints) by calling 1-800-QUIT NOW. Please call so we can get you on the path to becoming  a non-smoker. I know it is hard, but you can do this!  Lung RADS Categories:  Lung RADS 1: no nodules or definitely non-concerning nodules.  Recommendation is for a repeat annual scan in 12 months.  Lung RADS 2:  nodules that are non-concerning in appearance and behavior with a very low likelihood of becoming an active cancer. Recommendation is for a repeat annual scan in 12 months.  Lung RADS 3: nodules that are probably non-concerning , includes nodules with a low likelihood of becoming an active cancer.  Recommendation is for a 6-month repeat screening scan. Often noted after an upper respiratory illness. We will be in touch to make sure you have no questions, and to schedule your 6-month scan.  Lung RADS 4 A: nodules with concerning findings, recommendation is most often for a follow up scan in 3 months or additional testing based on our provider's assessment of the scan. We will be in touch to make sure you have no questions and to schedule the recommended 3 month follow up scan.  Lung RADS 4 B:  indicates findings that are concerning. We will be in touch with you to schedule additional diagnostic testing based on our provider's  assessment of the scan.  Other options for assistance in smoking cessation (   As covered by your insurance benefits)  Hypnosis for smoking cessation  [facility] [employer] [phone]  Acupuncture for smoking cessation  [facility] [phone]   

## 2021-10-22 NOTE — Progress Notes (Signed)
Virtual Visit via Telephone Note  I connected with Taylor Walsh on 10/22/21 at 12:00 PM EDT by telephone and verified that I am speaking with the correct person using two identifiers.  Location: Patient:  At home Provider: [address], [place], [place], [address]    I discussed the limitations, risks, security and privacy concerns of performing an evaluation and management service by telephone and the availability of in person appointments. I also discussed with the patient that there may be a patient responsible charge related to this service. The patient expressed understanding and agreed to proceed.   Shared Decision Making Visit Lung Cancer Screening Program [phone])   Eligibility: Age 66 y.o. Pack Years Smoking History Calculation 44 pack year smoking history (# packs/per year x # years smoked) Recent History of coughing up blood  no Unexplained weight loss? no ( >Than 15 pounds within the last 6 months ) Prior History Lung / other cancer no (Diagnosis within the last 5 years already requiring surveillance chest CT Scans). Smoking Status Former Smoker Former Smokers: Years since quit: 5 years  Quit Date: 2018  Visit Components: Discussion included one or more decision making aids. yes Discussion included risk/benefits of screening. yes Discussion included potential follow up diagnostic testing for abnormal scans. yes Discussion included meaning and risk of over diagnosis. yes Discussion included meaning and risk of False Positives. yes Discussion included meaning of total radiation exposure. yes  Counseling Included: Importance of adherence to annual lung cancer LDCT screening. yes Impact of comorbidities on ability to participate in the program. yes Ability and willingness to under diagnostic treatment. yes  Smoking Cessation Counseling: Current Smokers:  Discussed importance of smoking cessation. yes Information about tobacco cessation classes and  interventions provided to patient. yes Patient provided with "ticket" for LDCT Scan. yes Symptomatic Patient. no  Counseling NAS Diagnosis Code: Tobacco Use Z72.0 Asymptomatic Patient yes  Counseling (Intermediate counseling: > three minutes counseling) [id number] Former Smokers:  Discussed the importance of maintaining cigarette abstinence. yes Diagnosis Code: Personal History of Nicotine Dependence. Y18.563 Information about tobacco cessation classes and interventions provided to patient. Yes Patient provided with "ticket" for LDCT Scan. yes Written Order for Lung Cancer Screening with LDCT placed in Epic. Yes (CT Chest Lung Cancer Screening Low Dose W/O CM) [id number] Z12.2-Screening of respiratory organs Z87.891-Personal history of nicotine dependence  I spent 25 minutes of face to face time/virtual visit time  with  Ms. Freyre discussing the risks and benefits of lung cancer screening. We took the time to pause the power point at intervals to allow for questions to be asked and answered to ensure understanding. We discussed that she had taken the single most powerful action possible to decrease her risk of developing lung cancer when she quit smoking. I counseled her to remain smoke free, and to contact me if she ever had the desire to smoke again so that I can provide resources and tools to help support the effort to remain smoke free. We discussed the time and location of the scan, and that either  Doroteo Glassman RN, Joella Prince, RN or I  or I will call / send a letter with the results within  24-72 hours of receiving them. She has the office contact information in the event she needs to speak with me,  she verbalized understanding of all of the above and had no further questions upon leaving the office.     I explained to the patient that there has been  a high incidence of coronary artery disease noted on these exams. I explained that this is a non-gated exam therefore degree or severity  cannot be determined. This patient is on statin therapy. I have asked the patient to follow-up with their PCP regarding any incidental finding of coronary artery disease and management with diet or medication as they feel is clinically indicated. The patient verbalized understanding of the above and had no further questions.     Magdalen Spatz, NP 10/22/2021

## 2021-10-27 ENCOUNTER — Ambulatory Visit
Admission: RE | Admit: 2021-10-27 | Discharge: 2021-10-27 | Disposition: A | Discharge status: Home / Self Care | Payer: Medicare PPO | Source: Ambulatory Visit | Attending: Acute Care | Admitting: Acute Care

## 2021-10-27 DIAGNOSIS — Z87891 Personal history of nicotine dependence: Secondary | ICD-10-CM | POA: Diagnosis not present

## 2021-10-27 DIAGNOSIS — J432 Centrilobular emphysema: Secondary | ICD-10-CM | POA: Diagnosis not present

## 2021-10-27 DIAGNOSIS — Z122 Encounter for screening for malignant neoplasm of respiratory organs: Secondary | ICD-10-CM

## 2021-10-27 DIAGNOSIS — I2510 Atherosclerotic heart disease of native coronary artery without angina pectoris: Secondary | ICD-10-CM | POA: Diagnosis not present

## 2021-10-27 DIAGNOSIS — I700 Atherosclerosis of aorta: Secondary | ICD-10-CM | POA: Diagnosis not present

## 2021-10-28 DIAGNOSIS — I10 Essential (primary) hypertension: Secondary | ICD-10-CM | POA: Diagnosis not present

## 2021-10-28 DIAGNOSIS — F17201 Nicotine dependence, unspecified, in remission: Secondary | ICD-10-CM | POA: Diagnosis not present

## 2021-10-28 DIAGNOSIS — E1149 Type 2 diabetes mellitus with other diabetic neurological complication: Secondary | ICD-10-CM | POA: Diagnosis not present

## 2021-10-28 DIAGNOSIS — I700 Atherosclerosis of aorta: Secondary | ICD-10-CM | POA: Diagnosis not present

## 2021-10-28 DIAGNOSIS — G629 Polyneuropathy, unspecified: Secondary | ICD-10-CM | POA: Diagnosis not present

## 2021-10-28 DIAGNOSIS — E890 Postprocedural hypothyroidism: Secondary | ICD-10-CM | POA: Diagnosis not present

## 2021-10-28 DIAGNOSIS — E785 Hyperlipidemia, unspecified: Secondary | ICD-10-CM | POA: Diagnosis not present

## 2021-10-28 DIAGNOSIS — E859 Amyloidosis, unspecified: Secondary | ICD-10-CM | POA: Diagnosis not present

## 2021-10-28 DIAGNOSIS — E669 Obesity, unspecified: Secondary | ICD-10-CM | POA: Diagnosis not present

## 2021-10-28 DIAGNOSIS — C50919 Malignant neoplasm of unspecified site of unspecified female breast: Secondary | ICD-10-CM | POA: Diagnosis not present

## 2021-10-28 DIAGNOSIS — F419 Anxiety disorder, unspecified: Secondary | ICD-10-CM | POA: Diagnosis not present

## 2021-10-28 DIAGNOSIS — M810 Age-related osteoporosis without current pathological fracture: Secondary | ICD-10-CM | POA: Diagnosis not present

## 2021-10-28 DIAGNOSIS — Z23 Encounter for immunization: Secondary | ICD-10-CM | POA: Diagnosis not present

## 2021-10-28 DIAGNOSIS — I2510 Atherosclerotic heart disease of native coronary artery without angina pectoris: Secondary | ICD-10-CM | POA: Diagnosis not present

## 2021-10-29 ENCOUNTER — Other Ambulatory Visit: Payer: Self-pay | Admitting: Acute Care

## 2021-10-29 DIAGNOSIS — Z87891 Personal history of nicotine dependence: Secondary | ICD-10-CM

## 2021-10-29 DIAGNOSIS — Z122 Encounter for screening for malignant neoplasm of respiratory organs: Secondary | ICD-10-CM

## 2021-11-10 ENCOUNTER — Other Ambulatory Visit (HOSPITAL_COMMUNITY): Payer: Self-pay | Admitting: *Deleted

## 2021-11-11 ENCOUNTER — Ambulatory Visit (HOSPITAL_COMMUNITY)
Admission: RE | Admit: 2021-11-11 | Discharge: 2021-11-11 | Disposition: A | Discharge status: Home / Self Care | Payer: Medicare PPO | Source: Ambulatory Visit | Attending: Internal Medicine | Admitting: Internal Medicine

## 2021-11-11 DIAGNOSIS — M810 Age-related osteoporosis without current pathological fracture: Secondary | ICD-10-CM | POA: Diagnosis not present

## 2021-11-11 MED ORDER — ZOLEDRONIC ACID 5 MG/100ML IV SOLN
INTRAVENOUS | Status: AC
  Administered 2021-11-11: 5 mg
  Filled 2021-11-11: qty 100

## 2021-11-16 NOTE — Progress Notes (Unsigned)
[place]   [phone] [phone]   Clinic Follow up Note   Patient Care Team: Ginger Organ., MD as PCP - General (Internal Medicine) Melrose Nakayama, MD as Consulting Physician (Cardiothoracic Surgery) Fanny Skates, MD as Consulting Physician (General Surgery) Truitt Merle, MD as Consulting Physician (Hematology) Delice Bison, Charlestine Massed, NP as Nurse Practitioner (Hematology and Oncology) Eppie Gibson, MD as Attending Physician (Radiation Oncology) 11/17/2021  CHIEF COMPLAINT: Follow up left breast cancer   SUMMARY OF ONCOLOGIC HISTORY: Oncology History Overview Note  Cancer Staging Breast cancer of upper-inner quadrant of left female breast [facility]) Staging form: Breast, AJCC 8th Edition - Clinical stage from 07/10/2016: Stage 0 (cTis (DCIS), cN0, cM0, G3, ER: Positive, PR: Positive, HER2: Not Assessed) - Signed by Truitt Merle, MD on 07/28/2016 - Pathologic stage from 10/26/2016: Stage IA (pT1a, pN1a, cM0, G2, ER: Positive, PR: Positive, HER2: Negative) - Signed by Truitt Merle, MD on 11/11/2016     Breast cancer of upper-inner quadrant of left female breast ([place])  07/10/2016 Initial Biopsy   Diagnosis 1. 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 2. 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   07/10/2016 Receptors her2   ER 95%, PR 80-90%+,  both strong staining, HER2 - (1+), Ki67 80-90%   07/22/2016 Initial Diagnosis   Ductal carcinoma in situ (DCIS) of left breast   07/27/2016 Imaging   Breast MRI w wo contrast showed  1. Extensive mass and non mass enhancement along the lower inner aspect the left breast, lower outer quadrant, spanning 10.2 cm from anterior to posterior, including both biopsied lesions, as detailed  above. This is all consistent with DCIS. 2. No other abnormal enhancement in the left breast. 3. No abnormal or enlarged axillary lymph nodes. 4. 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 The Hereditary Gene Panel offered by [facility] includes sequencing and/or deletion duplication testing of the following 46 genes: APC, ATM, AXIN2, BARD1, BMPR1A, BRCA1, BRCA2, BRIP1, CDH1, CDKN2A (p14ARF), CDKN2A (p16INK4a), CHEK2, CTNNA1, DICER1, EPCAM (Deletion/duplication testing only), GREM1 (promoter region deletion/duplication testing only), KIT, MEN1, MLH1, MSH2, MSH3, MSH6, MUTYH, NBN, NF1, NHTL1, PALB2, PDGFRA, PMS2, POLD1, POLE, PTEN, RAD50, RAD51C, RAD51D, SDHB, SDHC, SDHD, SMAD4, SMARCA4. STK11, TP53, TSC1, TSC2, and VHL.  The following gene was evaluated for sequence changes only: SDHA and HOXB13 c.251G>A variant only.  The report date is Aug 09, 2016. 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  The change in variant classification was made as a result of re-review of the evidence in light of new variant interpretation guidelines and /or new information. The updated report date is January 26, 2018.    10/13/2016 Surgery   LEFT TOTAL MASTECTOMY WITH LEFT AXILLARY SENTINEL LYMPH NODE BIOPSY By Dr. Ninfa Linden   10/13/2016 Pathology Results   Surgery Diagnosis 10/13/16  1. Lymph node, sentinel, biopsy, Left Axillary - METASTATIC CARCINOMA IN ONE LYMPH NODE (1/1). 2. Lymph node, sentinel, biopsy, Left - METASTATIC CARCINOMA IN ONE LYMPH NODE (1/1). 3. Breast, radical mastectomy (including lymph nodes), Left - INVASIVE DUCTAL CARCINOMA, 0.3 CM. - LYMPHOVASCULAR INVOLVEMENT BY CARCINOMA. - HIGH GRADE DUCTAL CARCINOMA IN SITU WITH CALCIFICATIONS AND NECROSIS, 5.5 CM. - DCIS FOCALLY 0.1  CM FROM CAUTERIZED MEDIAL MARGIN. - SEE ONCOLOGY TABLE AND COMMENT. 4. Skin , Left additional mastectomy  flap - BENIGN FIBROADIPOSE TISSUE. - NO EVIDENCE OF MALIGNANCY.   10/30/2016 Miscellaneous   MammaPrint 10/31/26 Result:  High Risk,Luminal type B MPI: -0.368 Average 10-year risk of recurrence untreated: 29%   11/18/2016 Imaging   CT CAP W Contrast 11/18/16 IMPRESSION: 1. Skin thickening and interstitial changes involving the left breast, likely related to radiation. Surgical changes are also noted with a tissue expander in place. Probable postop fluid collection in the upper outer quadrant breast. No enlarged supraclavicular or axillary lymph nodes. 2. No CT findings suggesting metastatic disease involving the chest, abdomen or pelvis or bony structures. 3. Surgical changes from a left upper lobe wedge resection. No findings for recurrent tumor.   11/23/2016 Imaging   Bone Scan Whole body 11/23/16 IMPRESSION: No definite scintigraphic evidence of osseous metastatic disease as above.   11/25/2016 - 04/07/2017 Chemotherapy   Adjuvant chemotherapy AC every 2 weeks for 4 cycles, 11/25/16-01/05/17, followed by Taxol weekly for 12 cycles starting 01/20/17    04/28/2017 - 06/11/2017 Radiation Therapy    1. Left Chest Wall / 50.4 Gy in 28 fractions 2. Left SCV and PAB / 50.4 Gy in 28 fractions  3. Left Chest Wall Boost / 10 Gy in 5 fractions    05/2017 -  Anti-estrogen oral therapy   Anastrozole initially, then changed to Letrozole in April 2019     CURRENT THERAPY: Anastrozole starting 05/2017, switched to letrozole 06/2017 - goal total 7 years  INTERVAL HISTORY: Ms. Mcglynn returns for follow up as scheduled. Last seen by Dr. Burr Medico 05/16/21. R mammo 09/08/21 was negative. She had low dose screening CT chest 10/27/21 which was negative, wonders when she developed emphysema.  She denies respiratory symptoms such as cough, chest pain, dyspnea. She continues letrozole, tolerating well with no significant issues.  Hot flashes are stable.  Denies concerns in her breast such as new lump/mass, nipple  discharge or inversion, or skin change.  She is up-to-date on age-appropriate health maintenance and continues PCP and GYN follow-up.  She received Reclast on 8/14.  All other systems were reviewed with the patient and are negative.  MEDICAL HISTORY:  Past Medical History:  Diagnosis Date   Anxiety    Arthritis    Breast cancer ([place])    Cancer ([place]) 10/08/2016   left breast cancer   Diabetes mellitus without complication ([place])    Dyslipidemia    Family history of breast cancer    Family history of ovarian cancer    Family history of prostate cancer    GERD (gastroesophageal reflux disease)    nexium if needed   Heart murmur    History of radiation therapy 04/28/17- 06/11/17   Left Chest wall/ 50.4 Gy in 28 fractions. Left SCV/ 50.4 Gy in 28 fractions. Left Chest wall boost/ 10 Gy in 5 fractions.    Hypertension    Hypothyroid    Osteopenia    Osteoporosis 11/06/2019   Pneumonia 10/08/2016   June 2013    SURGICAL HISTORY: Past Surgical History:  Procedure Laterality Date   ABDOMINAL HERNIA REPAIR  01/19/2019   ABDOMINAL HYSTERECTOMY     BREAST RECONSTRUCTION WITH PLACEMENT OF TISSUE EXPANDER AND FLEX HD (ACELLULAR HYDRATED DERMIS) Left 10/13/2016   Procedure: LEFT BREAST RECONSTRUCTION WITH PLACEMENT OF TISSUE EXPANDER AND ALLODERM;  Surgeon: Irene Limbo, MD;  Location: [place];  Service: Plastics;  Laterality: Left;   FOOT SURGERY  bi-lat/ repaired hammer toes   MASTECTOMY Left 10/13/2016   MASTECTOMY W/ SENTINEL NODE BIOPSY Left 10/13/2016   MASTECTOMY W/ SENTINEL NODE BIOPSY Left 10/13/2016   Procedure: LEFT TOTAL MASTECTOMY WITH LEFT AXILLARY SENTINEL LYMPH NODE BIOPSY;  Surgeon: Coralie Keens, MD;  Location: [place];  Service: General;  Laterality: Left;   PORTACATH PLACEMENT Right 11/24/2016   Procedure: INSERTION PORT-A-CATH;  Surgeon: Fanny Skates, MD;  Location: [place];  Service: General;  Laterality: Right;   TUBAL LIGATION     VIDEO  ASSISTED THORACOSCOPY (VATS)/WEDGE RESECTION Left 02/25/2015   Procedure: VIDEO ASSISTED THORACOSCOPY (VATS)/ LUL WEDGE RESECTION with ON Q placement.;  Surgeon: Melrose Nakayama, MD;  Location: [place];  Service: Thoracic;  Laterality: Left;    I have reviewed the social history and family history with the patient and they are unchanged from previous note.  ALLERGIES:  is allergic to vicodin [hydrocodone-acetaminophen].  MEDICATIONS:  Current Outpatient Medications  Medication Sig Dispense Refill   albuterol (VENTOLIN HFA) 108 (90 Base) MCG/ACT inhaler Inhale 1-2 puffs into the lungs every 6 (six) hours as needed for wheezing or shortness of breath.     aspirin 81 MG tablet Take 81 mg by mouth daily.     atorvastatin (LIPITOR) 40 MG tablet Take 40 mg by mouth daily.     calcium-vitamin D 250-100 MG-UNIT tablet Take 2 tablets by mouth daily.      DULoxetine (CYMBALTA) 20 MG capsule Take 1 capsule (20 mg total) by mouth 2 (two) times daily. 60 capsule 5   ergocalciferol (VITAMIN D2) 50000 UNITS capsule Take 50,000 Units by mouth every Friday.      fluticasone (FLONASE) 50 MCG/ACT nasal spray fluticasone propionate 50 mcg/actuation nasal spray,suspension  USE 1 SPRAY(S) IN EACH NOSTRIL TWICE DAILY     gabapentin (NEURONTIN) 300 MG capsule gabapentin 300 mg capsule  TAKE 1 CAPSULE BY MOUTH EVERY 8 HOURS     ibuprofen (ADVIL,MOTRIN) 600 MG tablet Take 1 tablet (600 mg total) by mouth every 8 (eight) hours as needed. Take with food to protect stomach. 36 tablet 0   letrozole (FEMARA) 2.5 MG tablet Take 1 tablet (2.5 mg total) by mouth daily. 90 tablet 3   levothyroxine (SYNTHROID, LEVOTHROID) 75 MCG tablet   6   losartan (COZAAR) 50 MG tablet TAKE 1 TABLET BY MOUTH ONCE DAILY FOR 90 DAYS  1   metFORMIN (GLUCOPHAGE) 500 MG tablet TAKE 1 TABLET BY MOUTH TWICE DAILY WITH A MEAL (Patient taking differently: 1,000 mg 2 (two) times daily with a meal. TAKE 1 TABLET BY MOUTH TWICE DAILY WITH A MEAL) 60  tablet 2   NEOMYCIN-POLYMYXIN-HYDROCORTISONE (CORTISPORIN) 1 % SOLN OTIC solution Apply to nail beds from procedure site twice daily after soaks 10 mL 0   prochlorperazine (COMPAZINE) 5 MG tablet Take 1 tablet (5 mg total) by mouth every 6 (six) hours as needed for nausea or vomiting. 30 tablet 0   valACYclovir (VALTREX) 1000 MG tablet Take 1,000 mg by mouth daily.     zoledronic acid (RECLAST) 5 MG/100ML SOLN injection Reclast 5 mg/100 mL intravenous piggyback  Inject by intravenous route.     No current facility-administered medications for this visit.    PHYSICAL EXAMINATION: ECOG PERFORMANCE STATUS: 0 - Asymptomatic  Vitals:   11/17/21 1046  BP: 125/71  Pulse: 73  Resp: 17  Temp: 98.3 F (36.8 C)  SpO2: 100%   Filed Weights   11/17/21 1046  Weight: 177 lb 11.2 oz (  80.6 kg)    GENERAL:alert, no distress and comfortable SKIN: No rash EYES:  sclera clear NECK: Without mass LYMPH:  no palpable cervical or supraclavicular lymphadenopathy  LUNGS: clear with normal breathing effort HEART: regular rate & rhythm, no lower extremity edema ABDOMEN:abdomen soft, non-tender and normal bowel sounds NEURO: alert & oriented x 3 with fluent speech, no focal motor/sensory deficits Breast exam: S/p left mastectomy and reconstruction and right breast reduction, incisions completely healed.  No right nipple discharge or inversion.  No palpable mass or nodularity in either breast or axilla that I could appreciate.  LABORATORY DATA:  I have reviewed the data as listed    Latest Ref Rng & Units 11/17/2021   10:05 AM 05/16/2021    8:17 AM 11/18/2020   12:17 PM  CBC  WBC 4.0 - 10.5 K/uL 4.2  4.1  3.9   Hemoglobin 12.0 - 15.0 g/dL 13.2  13.0  12.9   Hematocrit 36.0 - 46.0 % 40.5  39.8  39.6   Platelets 150 - 400 K/uL 284  268  255         Latest Ref Rng & Units 11/17/2021   10:05 AM 05/16/2021    8:17 AM 11/18/2020   12:17 PM  CMP  Glucose 70 - 99 mg/dL 153  181  143   BUN 8 - 23 mg/dL  '11  13  11   ' Creatinine 0.44 - 1.00 mg/dL 1.00  0.82  0.95   Sodium 135 - 145 mmol/L 137  139  140   Potassium 3.5 - 5.1 mmol/L 4.3  4.0  4.1   Chloride 98 - 111 mmol/L 104  105  105   CO2 22 - 32 mmol/L '27  26  25   ' Calcium 8.9 - 10.3 mg/dL 9.8  9.7  9.1   Total Protein 6.5 - 8.1 g/dL 7.3  7.5  7.4   Total Bilirubin 0.3 - 1.2 mg/dL 0.7  0.8  0.6   Alkaline Phos 38 - 126 U/L 54  52  68   AST 15 - 41 U/L '16  17  15   ' ALT 0 - 44 U/L '16  18  18       ' RADIOGRAPHIC STUDIES: I have personally reviewed the radiological images as listed and agreed with the findings in the report. No results found.   ASSESSMENT & PLAN:  Taylor Walsh is 66 y.o.    1. 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  Plan to continue for total 7-10 years -S/p right breast reduction 08/31/2018 for symmetry -Mammogram 08/2021 negative -Ms. Masaki is clinically doing well.  Wrist exam is benign, labs are normal.  No clinical concern for breast cancer recurrence.   -She is 5 years from initial diagnosis, the recurrence risk has decreased.  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 Mickeal Skinner for further eval and  management, she will think about it -Not discussed today   3. Arthritis -Mainly in her knees, fluctuates with weather -Stable overall -Denies pain today   4. Osteopenia -Per PCP -Per patient recent DEXA was better on the right femur, but decreased in the left -Continues calcium, vitamin D, and Reclast last dose 11/10/2021  5.  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 H/o pulmonary amyloidosis -Found to have a lung nodule 12/2014, surgically removed, path showed pulmonary amyloidosis -Follow-up pulm -Lung cancer screening CT shows emphysema, otherwise benign  Plan: -Right mammo, CT chest, and today's labs reviewed -Continue breast cancer surveillance and letrozole, I refilled -Continue age-appropriate health maintenance and healthy active lifestyle, PCP, and GYN follow-up -Breast cancer surveillance visit in 1 year, or sooner if needed    Orders Placed This Encounter  Procedures   MM 3D SCREEN BREAST UNI RIGHT    Standing Status:   Future    Standing Expiration Date:   11/18/2022    Order Specific Question:   Reason for Exam (SYMPTOM  OR DIAGNOSIS REQUIRED)    Answer:   h/o left breast cancer s/p mastectomy and adjuvant therapy    Order Specific Question:   Preferred imaging location?    Answer:   [facility]   All questions were answered. The patient knows to call the clinic with any problems, questions or concerns. No barriers to learning was detected. I spent 20 minutes counseling the patient face to face. The total time spent in the appointment was 30 minutes and more than 50% was on counseling and review of test results.     Alla Feeling, NP 11/17/21

## 2021-11-17 ENCOUNTER — Other Ambulatory Visit: Payer: Self-pay

## 2021-11-17 ENCOUNTER — Inpatient Hospital Stay: Payer: Medicare PPO

## 2021-11-17 ENCOUNTER — Encounter: Payer: Self-pay | Admitting: Nurse Practitioner

## 2021-11-17 ENCOUNTER — Inpatient Hospital Stay: Payer: Medicare PPO | Attending: Nurse Practitioner | Admitting: Nurse Practitioner

## 2021-11-17 VITALS — BP 125/71 | HR 73 | Temp 98.3°F | Resp 17 | Ht 63.0 in | Wt 177.7 lb

## 2021-11-17 DIAGNOSIS — C50212 Malignant neoplasm of upper-inner quadrant of left female breast: Secondary | ICD-10-CM

## 2021-11-17 DIAGNOSIS — Z79811 Long term (current) use of aromatase inhibitors: Secondary | ICD-10-CM | POA: Insufficient documentation

## 2021-11-17 DIAGNOSIS — Z1231 Encounter for screening mammogram for malignant neoplasm of breast: Secondary | ICD-10-CM

## 2021-11-17 DIAGNOSIS — Z170 Estrogen receptor positive status [ER+]: Secondary | ICD-10-CM | POA: Insufficient documentation

## 2021-11-17 LAB — CBC WITH DIFFERENTIAL/PLATELET
Abs Immature Granulocytes: 0.01 10*3/uL (ref 0.00–0.07)
Basophils Absolute: 0 10*3/uL (ref 0.0–0.1)
Basophils Relative: 1 %
Eosinophils Absolute: 0.1 10*3/uL (ref 0.0–0.5)
Eosinophils Relative: 2 %
HCT: 40.5 % (ref 36.0–46.0)
Hemoglobin: 13.2 g/dL (ref 12.0–15.0)
Immature Granulocytes: 0 %
Lymphocytes Relative: 37 %
Lymphs Abs: 1.6 10*3/uL (ref 0.7–4.0)
MCH: 27.2 pg (ref 26.0–34.0)
MCHC: 32.6 g/dL (ref 30.0–36.0)
MCV: 83.5 fL (ref 80.0–100.0)
Monocytes Absolute: 0.5 10*3/uL (ref 0.1–1.0)
Monocytes Relative: 12 %
Neutro Abs: 2 10*3/uL (ref 1.7–7.7)
Neutrophils Relative %: 48 %
Platelets: 284 10*3/uL (ref 150–400)
RBC: 4.85 MIL/uL (ref 3.87–5.11)
RDW: 14.9 % (ref 11.5–15.5)
WBC: 4.2 10*3/uL (ref 4.0–10.5)
nRBC: 0 % (ref 0.0–0.2)

## 2021-11-17 LAB — COMPREHENSIVE METABOLIC PANEL
ALT: 16 U/L (ref 0–44)
AST: 16 U/L (ref 15–41)
Albumin: 4.3 g/dL (ref 3.5–5.0)
Alkaline Phosphatase: 54 U/L (ref 38–126)
Anion gap: 6 (ref 5–15)
BUN: 11 mg/dL (ref 8–23)
CO2: 27 mmol/L (ref 22–32)
Calcium: 9.8 mg/dL (ref 8.9–10.3)
Chloride: 104 mmol/L (ref 98–111)
Creatinine, Ser: 1 mg/dL (ref 0.44–1.00)
GFR, Estimated: >60 mL/min (ref >60)
Glucose, Bld: 153 mg/dL — ABNORMAL HIGH (ref 70–99)
Potassium: 4.3 mmol/L (ref 3.5–5.1)
Sodium: 137 mmol/L (ref 135–145)
Total Bilirubin: 0.7 mg/dL (ref 0.3–1.2)
Total Protein: 7.3 g/dL (ref 6.5–8.1)

## 2021-11-17 MED ORDER — LETROZOLE 2.5 MG PO TABS: 2.5000 mg | ORAL_TABLET | Freq: Every day | ORAL | 3 refills | Status: DC

## 2021-11-18 DIAGNOSIS — H40033 Anatomical narrow angle, bilateral: Secondary | ICD-10-CM | POA: Diagnosis not present

## 2021-11-18 DIAGNOSIS — E119 Type 2 diabetes mellitus without complications: Secondary | ICD-10-CM | POA: Diagnosis not present

## 2022-01-23 ENCOUNTER — Telehealth: Payer: Self-pay | Admitting: Pulmonary Disease

## 2022-01-23 NOTE — Telephone Encounter (Signed)
Spoke with pt and reviewed lung screening CT results from 10/27/21. Pt did not see the result letter was sent to her MyChart. Pt advised that there were no suspicious nodules seen on the CT. Coronary calcifications were noted. Pt is still on Atorvastatin daily. Pt advised that copy of CT had been sent to Dr Brigitte Pulse and that we would call her closer to 09/2022 to schedule next lung screening CT. Pt verbalized understanding. Nothing further needed at this time.

## 2022-02-03 DIAGNOSIS — Z124 Encounter for screening for malignant neoplasm of cervix: Secondary | ICD-10-CM | POA: Diagnosis not present

## 2022-02-03 DIAGNOSIS — M62838 Other muscle spasm: Secondary | ICD-10-CM | POA: Diagnosis not present

## 2022-02-03 DIAGNOSIS — Z6832 Body mass index (BMI) 32.0-32.9, adult: Secondary | ICD-10-CM | POA: Diagnosis not present

## 2022-02-23 DIAGNOSIS — J3089 Other allergic rhinitis: Secondary | ICD-10-CM | POA: Diagnosis not present

## 2022-02-23 DIAGNOSIS — R051 Acute cough: Secondary | ICD-10-CM | POA: Diagnosis not present

## 2022-02-23 DIAGNOSIS — R5383 Other fatigue: Secondary | ICD-10-CM | POA: Diagnosis not present

## 2022-02-23 DIAGNOSIS — J069 Acute upper respiratory infection, unspecified: Secondary | ICD-10-CM | POA: Diagnosis not present

## 2022-02-23 DIAGNOSIS — Z1152 Encounter for screening for COVID-19: Secondary | ICD-10-CM | POA: Diagnosis not present

## 2022-02-23 DIAGNOSIS — R0981 Nasal congestion: Secondary | ICD-10-CM | POA: Diagnosis not present

## 2022-04-20 DIAGNOSIS — E785 Hyperlipidemia, unspecified: Secondary | ICD-10-CM | POA: Diagnosis not present

## 2022-04-20 DIAGNOSIS — I10 Essential (primary) hypertension: Secondary | ICD-10-CM | POA: Diagnosis not present

## 2022-04-20 DIAGNOSIS — E1149 Type 2 diabetes mellitus with other diabetic neurological complication: Secondary | ICD-10-CM | POA: Diagnosis not present

## 2022-04-20 DIAGNOSIS — I700 Atherosclerosis of aorta: Secondary | ICD-10-CM | POA: Diagnosis not present

## 2022-04-20 DIAGNOSIS — E890 Postprocedural hypothyroidism: Secondary | ICD-10-CM | POA: Diagnosis not present

## 2022-04-20 DIAGNOSIS — M810 Age-related osteoporosis without current pathological fracture: Secondary | ICD-10-CM | POA: Diagnosis not present

## 2022-04-27 DIAGNOSIS — Z1339 Encounter for screening examination for other mental health and behavioral disorders: Secondary | ICD-10-CM | POA: Diagnosis not present

## 2022-04-27 DIAGNOSIS — E785 Hyperlipidemia, unspecified: Secondary | ICD-10-CM | POA: Diagnosis not present

## 2022-04-27 DIAGNOSIS — C50919 Malignant neoplasm of unspecified site of unspecified female breast: Secondary | ICD-10-CM | POA: Diagnosis not present

## 2022-04-27 DIAGNOSIS — Z0000 Encounter for general adult medical examination without abnormal findings: Secondary | ICD-10-CM | POA: Diagnosis not present

## 2022-04-27 DIAGNOSIS — Z23 Encounter for immunization: Secondary | ICD-10-CM | POA: Diagnosis not present

## 2022-04-27 DIAGNOSIS — E859 Amyloidosis, unspecified: Secondary | ICD-10-CM | POA: Diagnosis not present

## 2022-04-27 DIAGNOSIS — Z1331 Encounter for screening for depression: Secondary | ICD-10-CM | POA: Diagnosis not present

## 2022-04-27 DIAGNOSIS — R82998 Other abnormal findings in urine: Secondary | ICD-10-CM | POA: Diagnosis not present

## 2022-04-27 DIAGNOSIS — I700 Atherosclerosis of aorta: Secondary | ICD-10-CM | POA: Diagnosis not present

## 2022-04-27 DIAGNOSIS — J439 Emphysema, unspecified: Secondary | ICD-10-CM | POA: Diagnosis not present

## 2022-04-27 DIAGNOSIS — E1149 Type 2 diabetes mellitus with other diabetic neurological complication: Secondary | ICD-10-CM | POA: Diagnosis not present

## 2022-04-27 DIAGNOSIS — I2510 Atherosclerotic heart disease of native coronary artery without angina pectoris: Secondary | ICD-10-CM | POA: Diagnosis not present

## 2022-04-27 DIAGNOSIS — I10 Essential (primary) hypertension: Secondary | ICD-10-CM | POA: Diagnosis not present

## 2022-07-07 DIAGNOSIS — R42 Dizziness and giddiness: Secondary | ICD-10-CM | POA: Diagnosis not present

## 2022-07-07 DIAGNOSIS — E1149 Type 2 diabetes mellitus with other diabetic neurological complication: Secondary | ICD-10-CM | POA: Diagnosis not present

## 2022-07-07 DIAGNOSIS — J439 Emphysema, unspecified: Secondary | ICD-10-CM | POA: Diagnosis not present

## 2022-07-07 DIAGNOSIS — C50919 Malignant neoplasm of unspecified site of unspecified female breast: Secondary | ICD-10-CM | POA: Diagnosis not present

## 2022-07-07 DIAGNOSIS — J3089 Other allergic rhinitis: Secondary | ICD-10-CM | POA: Diagnosis not present

## 2022-07-07 DIAGNOSIS — E859 Amyloidosis, unspecified: Secondary | ICD-10-CM | POA: Diagnosis not present

## 2022-07-07 DIAGNOSIS — I10 Essential (primary) hypertension: Secondary | ICD-10-CM | POA: Diagnosis not present

## 2022-07-07 DIAGNOSIS — J069 Acute upper respiratory infection, unspecified: Secondary | ICD-10-CM | POA: Diagnosis not present

## 2022-07-07 DIAGNOSIS — I2510 Atherosclerotic heart disease of native coronary artery without angina pectoris: Secondary | ICD-10-CM | POA: Diagnosis not present

## 2022-07-20 DIAGNOSIS — H903 Sensorineural hearing loss, bilateral: Secondary | ICD-10-CM | POA: Diagnosis not present

## 2022-07-20 DIAGNOSIS — J310 Chronic rhinitis: Secondary | ICD-10-CM | POA: Diagnosis not present

## 2022-07-20 DIAGNOSIS — R0982 Postnasal drip: Secondary | ICD-10-CM | POA: Diagnosis not present

## 2022-07-20 DIAGNOSIS — J342 Deviated nasal septum: Secondary | ICD-10-CM | POA: Diagnosis not present

## 2022-07-20 DIAGNOSIS — J343 Hypertrophy of nasal turbinates: Secondary | ICD-10-CM | POA: Diagnosis not present

## 2022-08-26 IMAGING — MG MM DIGITAL SCREENING UNILAT*R* W/ TOMO W/ CAD
4 series · 4 of 12 positions shown · non-contrast
Comparison: Previous exam(s).

CLINICAL DATA: Screening.

EXAM:
DIGITAL SCREENING UNILATERAL RIGHT MAMMOGRAM WITH CAD AND
TOMOSYNTHESIS
TECHNIQUE: Right screening digital craniocaudal and mediolateral oblique
mammograms were obtained. Right screening digital breast
tomosynthesis was performed. The images were evaluated with
computer-aided detection.

[R CC synth-2D]
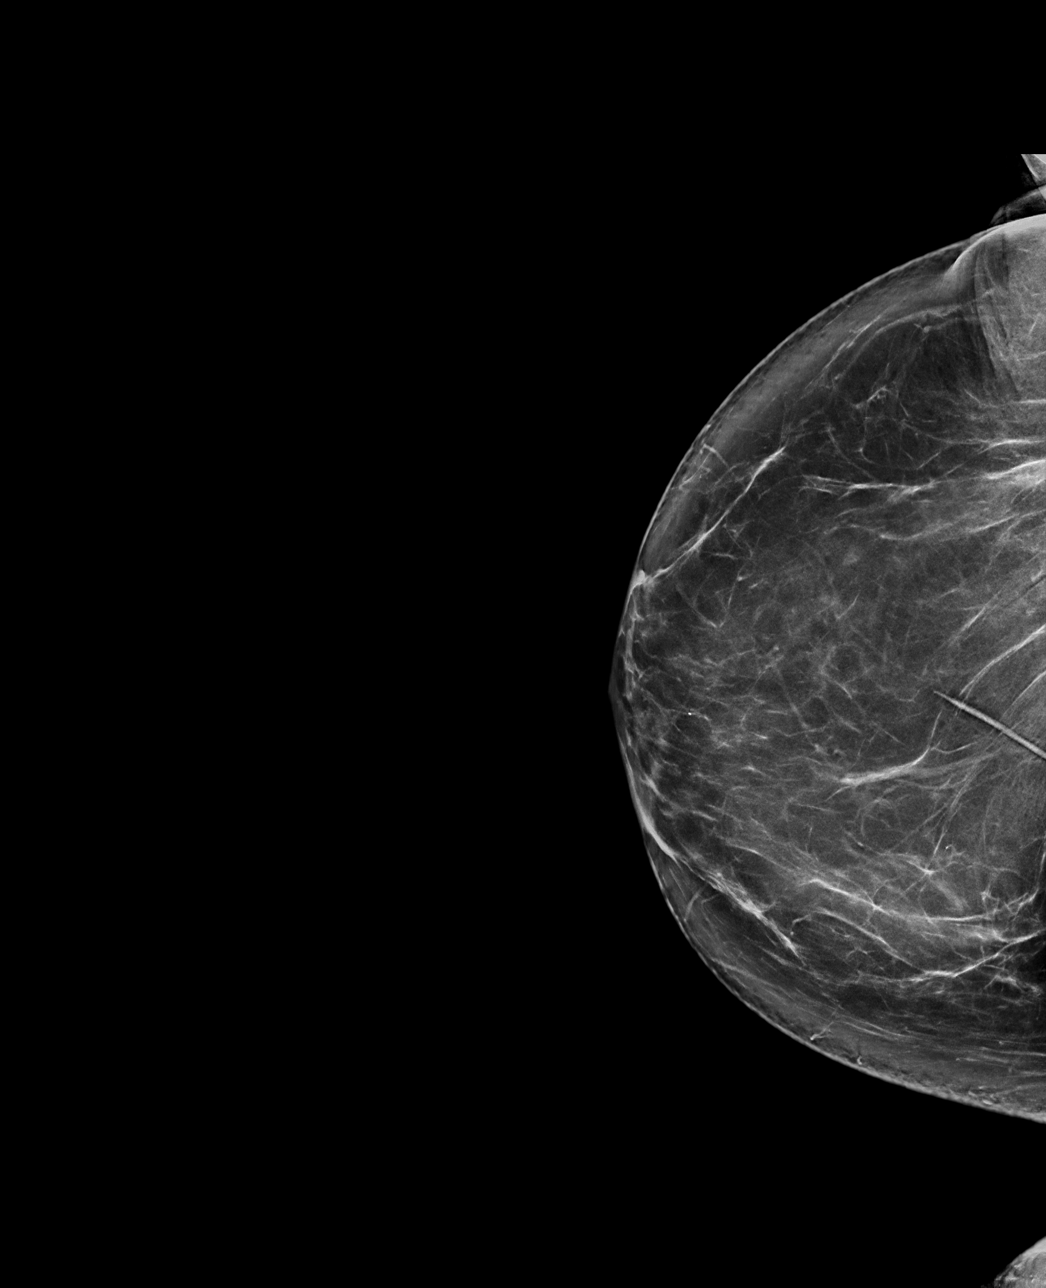

[R MLO synth-2D]
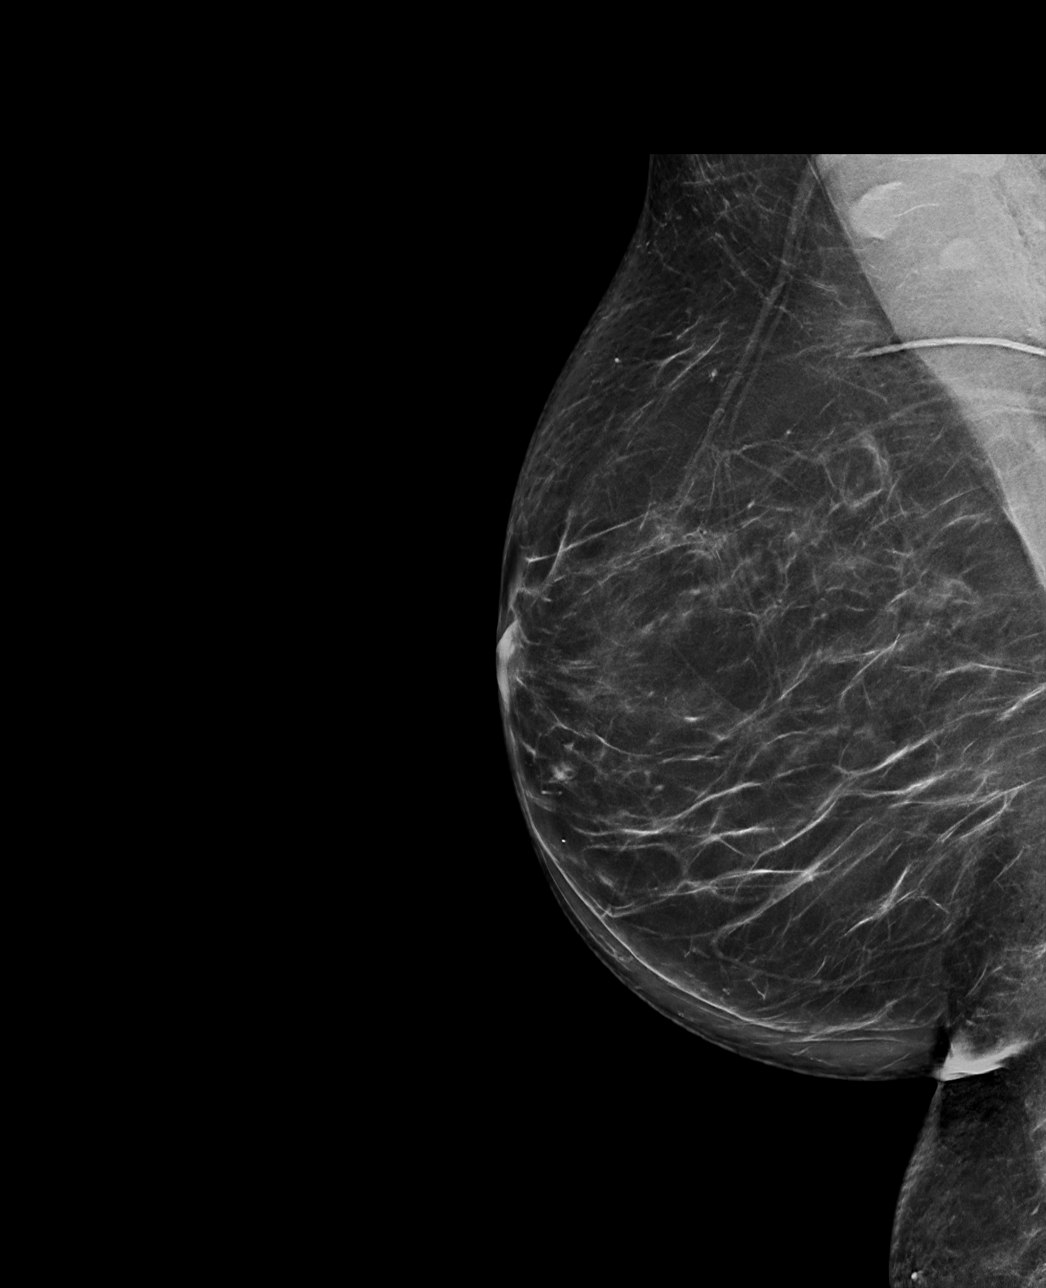

[R MLO tomo · tomo slice 43/84.0]
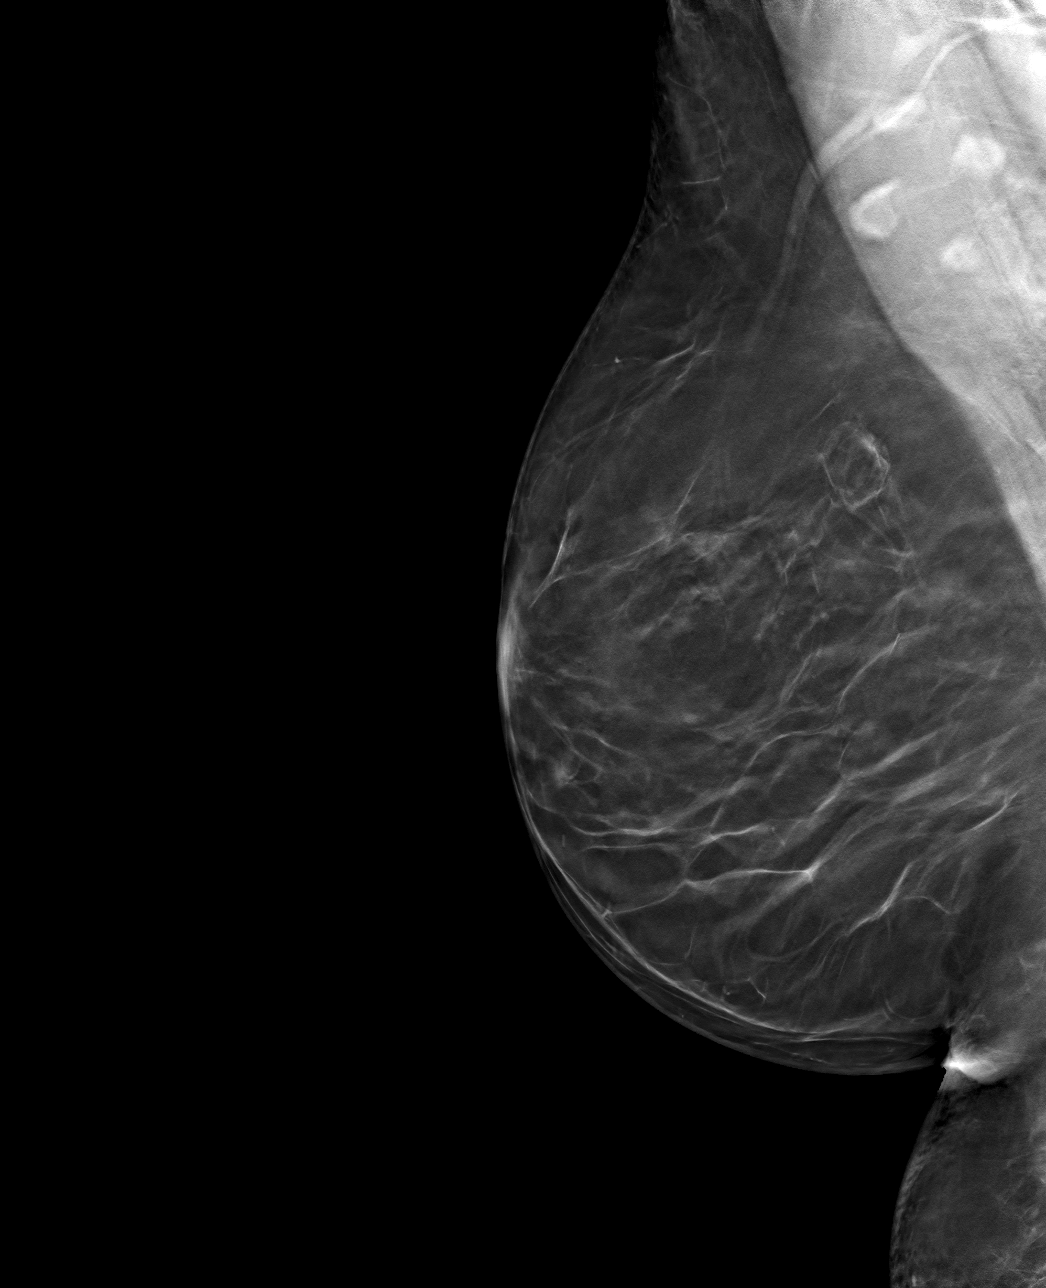

[R CC tomo · tomo slice 44/87.0]
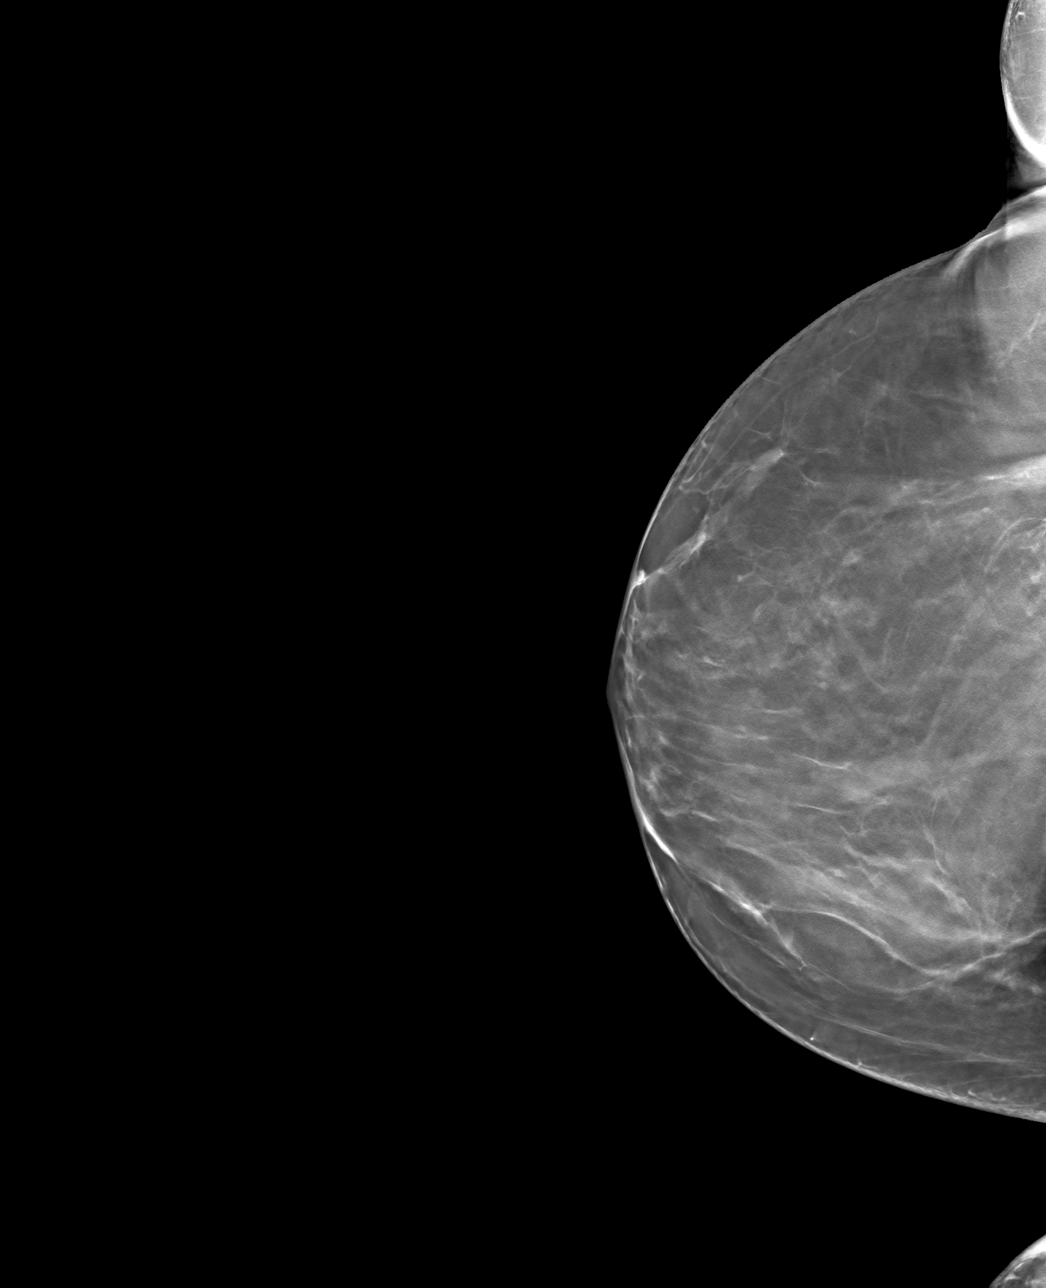

[4 of 12 positions shown; findings below may reference images not displayed]

ACR Breast Density Category b: There are scattered areas of
fibroglandular density.
FINDINGS: The patient has had a left mastectomy. There are no findings
suspicious for malignancy.
IMPRESSION: No mammographic evidence of malignancy. A result letter of this
screening mammogram will be mailed directly to the patient.

RECOMMENDATION:
Screening mammogram in one year.  (Code:NT-E-EGT)

BI-RADS CATEGORY  1: Negative.

## 2022-09-15 ENCOUNTER — Ambulatory Visit: Payer: Medicare PPO

## 2022-09-18 ENCOUNTER — Ambulatory Visit
Admission: RE | Admit: 2022-09-18 | Discharge: 2022-09-18 | Disposition: A | Discharge status: Home / Self Care | Payer: Medicare PPO | Source: Ambulatory Visit | Attending: Nurse Practitioner | Admitting: Nurse Practitioner

## 2022-09-18 DIAGNOSIS — Z1231 Encounter for screening mammogram for malignant neoplasm of breast: Secondary | ICD-10-CM

## 2022-09-21 ENCOUNTER — Telehealth: Payer: Self-pay

## 2022-09-21 DIAGNOSIS — J342 Deviated nasal septum: Secondary | ICD-10-CM | POA: Diagnosis not present

## 2022-09-21 DIAGNOSIS — J343 Hypertrophy of nasal turbinates: Secondary | ICD-10-CM | POA: Diagnosis not present

## 2022-09-21 DIAGNOSIS — J310 Chronic rhinitis: Secondary | ICD-10-CM | POA: Diagnosis not present

## 2022-09-21 DIAGNOSIS — R0982 Postnasal drip: Secondary | ICD-10-CM | POA: Diagnosis not present

## 2022-09-21 NOTE — Telephone Encounter (Signed)
Pt LVM for returned call.  Returned pt's call as requested.  Pt stated she attended the Cancer [place] this past weekend and was told that Dr. Mosetta Putt could write the pt a prescription to get 2 free massages.  Pt called requesting if she could get the prescription.  Informed pt that she could get the massages but they originate from the [facility].  Informed pt that this RN will contact them and have someone from the [facility] to contact her regarding the massages.  Pt verbalized understanding and had no further questions or concerns.  Sent a message to [employer].

## 2022-09-22 DIAGNOSIS — E669 Obesity, unspecified: Secondary | ICD-10-CM | POA: Diagnosis not present

## 2022-09-22 DIAGNOSIS — E1149 Type 2 diabetes mellitus with other diabetic neurological complication: Secondary | ICD-10-CM | POA: Diagnosis not present

## 2022-09-22 DIAGNOSIS — I10 Essential (primary) hypertension: Secondary | ICD-10-CM | POA: Diagnosis not present

## 2022-09-22 DIAGNOSIS — G620 Drug-induced polyneuropathy: Secondary | ICD-10-CM | POA: Diagnosis not present

## 2022-09-22 DIAGNOSIS — C50919 Malignant neoplasm of unspecified site of unspecified female breast: Secondary | ICD-10-CM | POA: Diagnosis not present

## 2022-09-22 DIAGNOSIS — M542 Cervicalgia: Secondary | ICD-10-CM | POA: Diagnosis not present

## 2022-09-22 DIAGNOSIS — E785 Hyperlipidemia, unspecified: Secondary | ICD-10-CM | POA: Diagnosis not present

## 2022-09-22 DIAGNOSIS — I700 Atherosclerosis of aorta: Secondary | ICD-10-CM | POA: Diagnosis not present

## 2022-10-29 ENCOUNTER — Ambulatory Visit
Admission: RE | Admit: 2022-10-29 | Discharge: 2022-10-29 | Disposition: A | Discharge status: Home / Self Care | Payer: Medicare PPO | Source: Ambulatory Visit

## 2022-10-29 DIAGNOSIS — Z122 Encounter for screening for malignant neoplasm of respiratory organs: Secondary | ICD-10-CM

## 2022-10-29 DIAGNOSIS — Z87891 Personal history of nicotine dependence: Secondary | ICD-10-CM | POA: Diagnosis not present

## 2022-11-04 ENCOUNTER — Other Ambulatory Visit: Payer: Self-pay | Admitting: Acute Care

## 2022-11-04 DIAGNOSIS — Z122 Encounter for screening for malignant neoplasm of respiratory organs: Secondary | ICD-10-CM

## 2022-11-04 DIAGNOSIS — Z87891 Personal history of nicotine dependence: Secondary | ICD-10-CM

## 2022-11-10 DIAGNOSIS — Z79899 Other long term (current) drug therapy: Secondary | ICD-10-CM | POA: Diagnosis not present

## 2022-11-10 DIAGNOSIS — M810 Age-related osteoporosis without current pathological fracture: Secondary | ICD-10-CM | POA: Diagnosis not present

## 2022-11-10 DIAGNOSIS — E1149 Type 2 diabetes mellitus with other diabetic neurological complication: Secondary | ICD-10-CM | POA: Diagnosis not present

## 2022-11-12 DIAGNOSIS — E1136 Type 2 diabetes mellitus with diabetic cataract: Secondary | ICD-10-CM | POA: Diagnosis not present

## 2022-11-12 DIAGNOSIS — E1142 Type 2 diabetes mellitus with diabetic polyneuropathy: Secondary | ICD-10-CM | POA: Diagnosis not present

## 2022-11-12 DIAGNOSIS — E785 Hyperlipidemia, unspecified: Secondary | ICD-10-CM | POA: Diagnosis not present

## 2022-11-12 DIAGNOSIS — J309 Allergic rhinitis, unspecified: Secondary | ICD-10-CM | POA: Diagnosis not present

## 2022-11-12 DIAGNOSIS — M810 Age-related osteoporosis without current pathological fracture: Secondary | ICD-10-CM | POA: Diagnosis not present

## 2022-11-12 DIAGNOSIS — E669 Obesity, unspecified: Secondary | ICD-10-CM | POA: Diagnosis not present

## 2022-11-12 DIAGNOSIS — K219 Gastro-esophageal reflux disease without esophagitis: Secondary | ICD-10-CM | POA: Diagnosis not present

## 2022-11-12 DIAGNOSIS — I10 Essential (primary) hypertension: Secondary | ICD-10-CM | POA: Diagnosis not present

## 2022-11-12 DIAGNOSIS — D84821 Immunodeficiency due to drugs: Secondary | ICD-10-CM | POA: Diagnosis not present

## 2022-11-13 ENCOUNTER — Other Ambulatory Visit: Payer: Self-pay

## 2022-11-17 ENCOUNTER — Other Ambulatory Visit: Payer: Self-pay

## 2022-11-17 DIAGNOSIS — C50212 Malignant neoplasm of upper-inner quadrant of left female breast: Secondary | ICD-10-CM

## 2022-11-17 NOTE — Assessment & Plan Note (Signed)
-  She has tried vit B complex and Cymbalta without much improvement -Partially managed on gabapentin 3 times daily but she does not take it consistently -Her symptoms are mainly tingling in the fingertips and occasional hand numbness

## 2022-11-17 NOTE — Assessment & Plan Note (Signed)
invasive ductal carcinoma, pT1aN1aM0, stage IA, G2, ER+/PR+/HER2-, with DCIS, high grade, mammaprint high risk luminal type B -Diagnosed 06/2016, s/p left mastectomy and reconstruction, adjuvant chemo AC-T, and radiation -She began antiestrogen therapy with anastrozole 05/2017, due to poor tolerance switched to letrozole 06/2017 and has been tolerating well.  Plan to continue for total 7 years -S/p right breast reduction 08/31/2018 for symmetry -from a breast cancer standpoint, she is clinically doing well. Labs reviewed, overall no concern. Physical exam was unremarkable. There is no clinical concern for recurrence.

## 2022-11-17 NOTE — Assessment & Plan Note (Signed)
-  f/u with PCP -continue DEXA every 2 years

## 2022-11-18 ENCOUNTER — Inpatient Hospital Stay (HOSPITAL_BASED_OUTPATIENT_CLINIC_OR_DEPARTMENT_OTHER): Payer: Medicare PPO | Admitting: Hematology

## 2022-11-18 ENCOUNTER — Inpatient Hospital Stay: Payer: Medicare PPO | Attending: Hematology

## 2022-11-18 ENCOUNTER — Encounter: Payer: Self-pay | Admitting: Hematology

## 2022-11-18 VITALS — BP 150/85 | HR 83 | Temp 98.4°F | Resp 20 | Wt 187.2 lb

## 2022-11-18 DIAGNOSIS — Z170 Estrogen receptor positive status [ER+]: Secondary | ICD-10-CM | POA: Diagnosis not present

## 2022-11-18 DIAGNOSIS — Z923 Personal history of irradiation: Secondary | ICD-10-CM | POA: Diagnosis not present

## 2022-11-18 DIAGNOSIS — G620 Drug-induced polyneuropathy: Secondary | ICD-10-CM

## 2022-11-18 DIAGNOSIS — Z1231 Encounter for screening mammogram for malignant neoplasm of breast: Secondary | ICD-10-CM

## 2022-11-18 DIAGNOSIS — T451X5A Adverse effect of antineoplastic and immunosuppressive drugs, initial encounter: Secondary | ICD-10-CM

## 2022-11-18 DIAGNOSIS — Z79811 Long term (current) use of aromatase inhibitors: Secondary | ICD-10-CM | POA: Insufficient documentation

## 2022-11-18 DIAGNOSIS — M810 Age-related osteoporosis without current pathological fracture: Secondary | ICD-10-CM

## 2022-11-18 DIAGNOSIS — C50212 Malignant neoplasm of upper-inner quadrant of left female breast: Secondary | ICD-10-CM

## 2022-11-18 LAB — CMP (CANCER CENTER ONLY)
ALT: 18 U/L (ref 0–44)
AST: 19 U/L (ref 15–41)
Albumin: 4.1 g/dL (ref 3.5–5.0)
Alkaline Phosphatase: 46 U/L (ref 38–126)
Anion gap: 6 (ref 5–15)
BUN: 7 mg/dL — ABNORMAL LOW (ref 8–23)
CO2: 29 mmol/L (ref 22–32)
Calcium: 9 mg/dL (ref 8.9–10.3)
Chloride: 105 mmol/L (ref 98–111)
Creatinine: 0.85 mg/dL (ref 0.44–1.00)
GFR, Estimated: >60 mL/min (ref >60)
Glucose, Bld: 151 mg/dL — ABNORMAL HIGH (ref 70–99)
Potassium: 3.9 mmol/L (ref 3.5–5.1)
Sodium: 140 mmol/L (ref 135–145)
Total Bilirubin: 0.8 mg/dL (ref 0.3–1.2)
Total Protein: 7.1 g/dL (ref 6.5–8.1)

## 2022-11-18 LAB — CBC WITH DIFFERENTIAL (CANCER CENTER ONLY)
Abs Immature Granulocytes: 0 10*3/uL (ref 0.00–0.07)
Basophils Absolute: 0 10*3/uL (ref 0.0–0.1)
Basophils Relative: 0 %
Eosinophils Absolute: 0.1 10*3/uL (ref 0.0–0.5)
Eosinophils Relative: 3 %
HCT: 38.7 % (ref 36.0–46.0)
Hemoglobin: 12.3 g/dL (ref 12.0–15.0)
Immature Granulocytes: 0 %
Lymphocytes Relative: 37 %
Lymphs Abs: 1.5 10*3/uL (ref 0.7–4.0)
MCH: 26.3 pg (ref 26.0–34.0)
MCHC: 31.8 g/dL (ref 30.0–36.0)
MCV: 82.9 fL (ref 80.0–100.0)
Monocytes Absolute: 0.4 10*3/uL (ref 0.1–1.0)
Monocytes Relative: 10 %
Neutro Abs: 2 10*3/uL (ref 1.7–7.7)
Neutrophils Relative %: 50 %
Platelet Count: 248 10*3/uL (ref 150–400)
RBC: 4.67 MIL/uL (ref 3.87–5.11)
RDW: 15.5 % (ref 11.5–15.5)
WBC Count: 4 10*3/uL (ref 4.0–10.5)
nRBC: 0 % (ref 0.0–0.2)

## 2022-11-18 MED ORDER — LETROZOLE 2.5 MG PO TABS: 2.5000 mg | ORAL_TABLET | Freq: Every day | ORAL | 3 refills | Status: DC

## 2022-11-18 NOTE — Progress Notes (Signed)
[facility]   [phone] Fax:([phone]   Clinic Follow up Note   Patient Care Team: Cleatis Polka., MD as PCP - General (Internal Medicine) Loreli Slot, MD as Consulting Physician (Cardiothoracic Surgery) Claud Kelp, MD as Consulting Physician (General Surgery) Malachy Mood, MD as Consulting Physician (Hematology) Loa Socks, NP as Nurse Practitioner (Hematology and Oncology) Lonie Peak, MD as Attending Physician (Radiation Oncology)  Date of Service:  11/18/2022  CHIEF COMPLAINT: f/u of left breast cancer   CURRENT THERAPY:  Letrozole started 06/2017  ASSESSMENT:  Taylor Walsh is a 67 y.o. 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  Plan to continue for total 7 years -S/p right breast reduction 08/31/2018 for symmetry -from a breast cancer standpoint, she is clinically doing well. Labs reviewed, overall no concern. Physical exam was unremarkable. There is no clinical concern for recurrence.  Neuropathy due to chemotherapeutic drug (HCC) -She has tried vit B complex and Cymbalta without much improvement -Partially managed on gabapentin 3 times daily but she does not take it consistently -Her symptoms are mainly tingling in the fingertips and occasional hand numbness  Osteoporosis -f/u with PCP -continue DEXA every 2 years  -Continue calcium and vitamin D, she is on Reclast yearly with her PCP   PLAN: -I refill Letrozole, she will continue until March 2026 -I order screening right screening mammogram for June 2025 -lab  and f/u in 1 year with NP Lacie   SUMMARY OF ONCOLOGIC HISTORY: Oncology History Overview Note  Cancer Staging Breast cancer of upper-inner quadrant of left female breast [facility]) Staging form: Breast, AJCC 8th Edition - Clinical stage from 07/10/2016: Stage 0 (cTis (DCIS), cN0, cM0, G3, ER: Positive, PR: Positive, HER2: Not Assessed) - Signed by Malachy Mood, MD on 07/28/2016 - Pathologic stage from 10/26/2016: Stage IA (pT1a, pN1a, cM0, G2, ER: Positive, PR: Positive, HER2: Negative) - Signed by Malachy Mood, MD on 11/11/2016     Breast cancer of upper-inner quadrant of left female breast (HCC)  07/10/2016 Initial Biopsy   Diagnosis 1. 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 2. 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   07/10/2016 Receptors her2   ER 95%, PR 80-90%+,  both strong staining, HER2 - (1+), Ki67 80-90%   07/22/2016 Initial Diagnosis   Ductal carcinoma in situ (DCIS) of left breast   07/27/2016 Imaging   Breast MRI w wo contrast showed  1. Extensive mass and non mass enhancement along the lower inner aspect the left breast, lower outer quadrant, spanning 10.2 cm from anterior to posterior, including both biopsied lesions, as detailed above. This is all consistent with DCIS. 2. No other abnormal enhancement in the left breast. 3. No abnormal or enlarged axillary lymph nodes. 4. 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 The Hereditary Gene Panel offered by [facility] includes sequencing and/or deletion duplication  testing of the following 46 genes: APC, ATM, AXIN2, BARD1, BMPR1A, BRCA1, BRCA2, BRIP1, CDH1, CDKN2A (p14ARF), CDKN2A (p16INK4a), CHEK2, CTNNA1, DICER1, EPCAM  (Deletion/duplication testing only), GREM1 (promoter region deletion/duplication testing only), KIT, MEN1, MLH1, MSH2, MSH3, MSH6, MUTYH, NBN, NF1, NHTL1, PALB2, PDGFRA, PMS2, POLD1, POLE, PTEN, RAD50, RAD51C, RAD51D, SDHB, SDHC, SDHD, SMAD4, SMARCA4. STK11, TP53, TSC1, TSC2, and VHL.  The following gene was evaluated for sequence changes only: SDHA and HOXB13 c.251G>A variant only.  The report date is Aug 09, 2016. 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  The change in variant classification was made as a result of re-review of the evidence in light of new variant interpretation guidelines and /or new information. The updated report date is January 26, 2018.    10/13/2016 Surgery   LEFT TOTAL MASTECTOMY WITH LEFT AXILLARY SENTINEL LYMPH NODE BIOPSY By Dr. Magnus Ivan   10/13/2016 Pathology Results   Surgery Diagnosis 10/13/16  1. Lymph node, sentinel, biopsy, Left Axillary - METASTATIC CARCINOMA IN ONE LYMPH NODE (1/1). 2. Lymph node, sentinel, biopsy, Left - METASTATIC CARCINOMA IN ONE LYMPH NODE (1/1). 3. Breast, radical mastectomy (including lymph nodes), Left - INVASIVE DUCTAL CARCINOMA, 0.3 CM. - LYMPHOVASCULAR INVOLVEMENT BY CARCINOMA. - HIGH GRADE DUCTAL CARCINOMA IN SITU WITH CALCIFICATIONS AND NECROSIS, 5.5 CM. - DCIS FOCALLY 0.1 CM FROM CAUTERIZED MEDIAL MARGIN. - SEE ONCOLOGY TABLE AND COMMENT. 4. Skin , Left additional mastectomy flap - BENIGN FIBROADIPOSE TISSUE. - NO EVIDENCE OF MALIGNANCY.   10/30/2016 Miscellaneous   MammaPrint 10/31/26 Result:  High Risk,Luminal type B MPI: -0.368 Average 10-year risk of recurrence untreated: 29%   11/18/2016 Imaging   CT CAP W Contrast 11/18/16 IMPRESSION: 1. Skin thickening and interstitial changes involving the left breast, likely related to radiation. Surgical changes are also noted with a tissue expander in place. Probable postop fluid collection in the upper outer quadrant breast. No enlarged supraclavicular  or axillary lymph nodes. 2. No CT findings suggesting metastatic disease involving the chest, abdomen or pelvis or bony structures. 3. Surgical changes from a left upper lobe wedge resection. No findings for recurrent tumor.   11/23/2016 Imaging   Bone Scan Whole body 11/23/16 IMPRESSION: No definite scintigraphic evidence of osseous metastatic disease as above.   11/25/2016 - 04/07/2017 Chemotherapy   Adjuvant chemotherapy AC every 2 weeks for 4 cycles, 11/25/16-01/05/17, followed by Taxol weekly for 12 cycles starting 01/20/17    04/28/2017 - 06/11/2017 Radiation Therapy    1. Left Chest Wall / 50.4 Gy in 28 fractions 2. Left SCV and PAB / 50.4 Gy in 28 fractions  3. Left Chest Wall Boost / 10 Gy in 5 fractions    05/2017 -  Anti-estrogen oral therapy   Anastrozole initially, then changed to Letrozole in April 2019      INTERVAL HISTORY:  TERIA TOOTHAKER is here for a follow up of left breast cancer. She was last seen by NP Lacie on 11/17/2021 She presents to the clinic alone. Pt state that she is doing well. Pt report her appetite is too good. Pt state that she has no problem with Letrozole. Pt denies having any pain.     All other systems were reviewed with the patient and are negative.  MEDICAL HISTORY:  Past Medical History:  Diagnosis Date   Anxiety    Arthritis    Breast cancer (HCC)    Cancer (HCC) 10/08/2016   left breast cancer   Diabetes mellitus without complication (HCC)  Dyslipidemia    Family history of breast cancer    Family history of ovarian cancer    Family history of prostate cancer    GERD (gastroesophageal reflux disease)    nexium if needed   Heart murmur    History of radiation therapy 04/28/17- 06/11/17   Left Chest wall/ 50.4 Gy in 28 fractions. Left SCV/ 50.4 Gy in 28 fractions. Left Chest wall boost/ 10 Gy in 5 fractions.    Hypertension    Hypothyroid    Osteopenia    Osteoporosis 11/06/2019   Pneumonia 10/08/2016   June 2013    SURGICAL  HISTORY: Past Surgical History:  Procedure Laterality Date   ABDOMINAL HERNIA REPAIR  01/19/2019   ABDOMINAL HYSTERECTOMY     BREAST RECONSTRUCTION WITH PLACEMENT OF TISSUE EXPANDER AND FLEX HD (ACELLULAR HYDRATED DERMIS) Left 10/13/2016   Procedure: LEFT BREAST RECONSTRUCTION WITH PLACEMENT OF TISSUE EXPANDER AND ALLODERM;  Surgeon: Glenna Fellows, MD;  Location: [facility] OR;  Service: Plastics;  Laterality: Left;   FOOT SURGERY     bi-lat/ repaired hammer toes   MASTECTOMY Left 10/13/2016   MASTECTOMY W/ SENTINEL NODE BIOPSY Left 10/13/2016   MASTECTOMY W/ SENTINEL NODE BIOPSY Left 10/13/2016   Procedure: LEFT TOTAL MASTECTOMY WITH LEFT AXILLARY SENTINEL LYMPH NODE BIOPSY;  Surgeon: Abigail Miyamoto, MD;  Location: [facility] OR;  Service: General;  Laterality: Left;   PORTACATH PLACEMENT Right 11/24/2016   Procedure: INSERTION PORT-A-CATH;  Surgeon: Claud Kelp, MD;  Location: [facility];  Service: General;  Laterality: Right;   TUBAL LIGATION     VIDEO ASSISTED THORACOSCOPY (VATS)/WEDGE RESECTION Left 02/25/2015   Procedure: VIDEO ASSISTED THORACOSCOPY (VATS)/ LUL WEDGE RESECTION with ON Q placement.;  Surgeon: Loreli Slot, MD;  Location: [facility] OR;  Service: Thoracic;  Laterality: Left;    I have reviewed the social history and family history with the patient and they are unchanged from previous note.  ALLERGIES:  is allergic to vicodin [hydrocodone-acetaminophen].  MEDICATIONS:  Current Outpatient Medications  Medication Sig Dispense Refill   albuterol (VENTOLIN HFA) 108 (90 Base) MCG/ACT inhaler Inhale 1-2 puffs into the lungs every 6 (six) hours as needed for wheezing or shortness of breath.     aspirin 81 MG tablet Take 81 mg by mouth daily.     atorvastatin (LIPITOR) 40 MG tablet Take 40 mg by mouth daily.     calcium-vitamin D 250-100 MG-UNIT tablet Take 2 tablets by mouth daily.      DULoxetine (CYMBALTA) 20 MG capsule Take 1 capsule (20 mg total) by mouth 2  (two) times daily. 60 capsule 5   ergocalciferol (VITAMIN D2) 50000 UNITS capsule Take 50,000 Units by mouth every Friday.      fluticasone (FLONASE) 50 MCG/ACT nasal spray fluticasone propionate 50 mcg/actuation nasal spray,suspension  USE 1 SPRAY(S) IN EACH NOSTRIL TWICE DAILY     gabapentin (NEURONTIN) 300 MG capsule gabapentin 300 mg capsule  TAKE 1 CAPSULE BY MOUTH EVERY 8 HOURS     ibuprofen (ADVIL,MOTRIN) 600 MG tablet Take 1 tablet (600 mg total) by mouth every 8 (eight) hours as needed. Take with food to protect stomach. 36 tablet 0   letrozole (FEMARA) 2.5 MG tablet Take 1 tablet (2.5 mg total) by mouth daily. 90 tablet 3   levothyroxine (SYNTHROID, LEVOTHROID) 75 MCG tablet   6   losartan (COZAAR) 50 MG tablet TAKE 1 TABLET BY MOUTH ONCE DAILY FOR 90 DAYS  1   metFORMIN (GLUCOPHAGE) 500 MG tablet TAKE  1 TABLET BY MOUTH TWICE DAILY WITH A MEAL (Patient taking differently: 1,000 mg 2 (two) times daily with a meal. TAKE 1 TABLET BY MOUTH TWICE DAILY WITH A MEAL) 60 tablet 2   NEOMYCIN-POLYMYXIN-HYDROCORTISONE (CORTISPORIN) 1 % SOLN OTIC solution Apply to nail beds from procedure site twice daily after soaks 10 mL 0   prochlorperazine (COMPAZINE) 5 MG tablet Take 1 tablet (5 mg total) by mouth every 6 (six) hours as needed for nausea or vomiting. 30 tablet 0   valACYclovir (VALTREX) 1000 MG tablet Take 1,000 mg by mouth daily.     zoledronic acid (RECLAST) 5 MG/100ML SOLN injection Reclast 5 mg/100 mL intravenous piggyback  Inject by intravenous route.     No current facility-administered medications for this visit.    PHYSICAL EXAMINATION: ECOG PERFORMANCE STATUS: 0 - Asymptomatic  Vitals:   11/18/22 1013  BP: (!) 150/85  Pulse: 83  Resp: 20  Temp: 98.4 F (36.9 C)  SpO2: 100%   Wt Readings from Last 3 Encounters:  11/18/22 187 lb 3.2 oz (84.9 kg)  11/17/21 177 lb 11.2 oz (80.6 kg)  11/11/21 176 lb (79.8 kg)    GENERAL:alert, no distress and comfortable SKIN: skin color  normal, no rashes or significant lesions EYES: normal, Conjunctiva are pink and non-injected, sclera clear  NEURO: alert & oriented x 3 with fluent speech ABDOMEN:(-)abdomen soft, (-) non-tender and(-) normal bowel sounds LABORATORY DATA:  I have reviewed the data as listed    Latest Ref Rng & Units 11/18/2022    9:54 AM 11/17/2021   10:05 AM 05/16/2021    8:17 AM  CBC  WBC 4.0 - 10.5 K/uL 4.0  4.2  4.1   Hemoglobin 12.0 - 15.0 g/dL 40.9  81.1  91.4   Hematocrit 36.0 - 46.0 % 38.7  40.5  39.8   Platelets 150 - 400 K/uL 248  284  268         Latest Ref Rng & Units 11/18/2022    9:54 AM 11/17/2021   10:05 AM 05/16/2021    8:17 AM  CMP  Glucose 70 - 99 mg/dL 782  956  213   BUN 8 - 23 mg/dL 7  11  13    Creatinine 0.44 - 1.00 mg/dL 0.86  5.78  4.69   Sodium 135 - 145 mmol/L 140  137  139   Potassium 3.5 - 5.1 mmol/L 3.9  4.3  4.0   Chloride 98 - 111 mmol/L 105  104  105   CO2 22 - 32 mmol/L 29  27  26    Calcium 8.9 - 10.3 mg/dL 9.0  9.8  9.7   Total Protein 6.5 - 8.1 g/dL 7.1  7.3  7.5   Total Bilirubin 0.3 - 1.2 mg/dL 0.8  0.7  0.8   Alkaline Phos 38 - 126 U/L 46  54  52   AST 15 - 41 U/L 19  16  17    ALT 0 - 44 U/L 18  16  18        RADIOGRAPHIC STUDIES: I have personally reviewed the radiological images as listed and agreed with the findings in the report. No results found.    Orders Placed This Encounter  Procedures   MM Digital Screening Unilat R    Standing Status:   Future    Standing Expiration Date:   11/18/2023    Order Specific Question:   Reason for Exam (SYMPTOM  OR DIAGNOSIS REQUIRED)    Answer:   screening  Order Specific Question:   Preferred imaging location?    Answer:   [facility]   All questions were answered. The patient knows to call the clinic with any problems, questions or concerns. No barriers to learning was detected. The total time spent in the appointment was 25 minutes.     Malachy Mood, MD 11/18/2022   Carolin Coy, CMA, am  acting as scribe for Malachy Mood, MD.   I have reviewed the above documentation for accuracy and completeness, and I agree with the above.

## 2022-11-19 ENCOUNTER — Other Ambulatory Visit (HOSPITAL_COMMUNITY): Payer: Self-pay | Admitting: *Deleted

## 2022-11-20 ENCOUNTER — Inpatient Hospital Stay (HOSPITAL_COMMUNITY): Admission: RE | Admit: 2022-11-20 | Payer: Medicare PPO | Source: Ambulatory Visit

## 2022-11-27 ENCOUNTER — Ambulatory Visit (HOSPITAL_COMMUNITY)
Admission: RE | Admit: 2022-11-27 | Discharge: 2022-11-27 | Disposition: A | Discharge status: Home / Self Care | Payer: Medicare PPO | Source: Ambulatory Visit | Attending: Internal Medicine | Admitting: Internal Medicine

## 2022-11-27 DIAGNOSIS — M810 Age-related osteoporosis without current pathological fracture: Secondary | ICD-10-CM | POA: Diagnosis not present

## 2022-11-27 MED ORDER — ZOLEDRONIC ACID 5 MG/100ML IV SOLN
INTRAVENOUS | Status: AC
  Administered 2022-11-27: 5 mg via INTRAVENOUS
  Filled 2022-11-27: qty 100

## 2022-11-27 MED ORDER — ZOLEDRONIC ACID 5 MG/100ML IV SOLN: 5.0000 mg | Freq: Once | INTRAVENOUS | Status: AC

## 2022-12-22 DIAGNOSIS — R350 Frequency of micturition: Secondary | ICD-10-CM | POA: Diagnosis not present

## 2022-12-22 DIAGNOSIS — M545 Low back pain, unspecified: Secondary | ICD-10-CM | POA: Diagnosis not present

## 2022-12-23 DIAGNOSIS — M545 Low back pain, unspecified: Secondary | ICD-10-CM | POA: Diagnosis not present

## 2023-02-08 DIAGNOSIS — R8290 Unspecified abnormal findings in urine: Secondary | ICD-10-CM | POA: Diagnosis not present

## 2023-02-08 DIAGNOSIS — R319 Hematuria, unspecified: Secondary | ICD-10-CM | POA: Diagnosis not present

## 2023-02-08 DIAGNOSIS — Z6832 Body mass index (BMI) 32.0-32.9, adult: Secondary | ICD-10-CM | POA: Diagnosis not present

## 2023-02-08 DIAGNOSIS — M62838 Other muscle spasm: Secondary | ICD-10-CM | POA: Diagnosis not present

## 2023-02-08 DIAGNOSIS — R3129 Other microscopic hematuria: Secondary | ICD-10-CM | POA: Diagnosis not present

## 2023-02-08 DIAGNOSIS — Z779 Other contact with and (suspected) exposures hazardous to health: Secondary | ICD-10-CM | POA: Diagnosis not present

## 2023-02-08 DIAGNOSIS — D0512 Intraductal carcinoma in situ of left breast: Secondary | ICD-10-CM | POA: Diagnosis not present

## 2023-02-08 DIAGNOSIS — M810 Age-related osteoporosis without current pathological fracture: Secondary | ICD-10-CM | POA: Diagnosis not present

## 2023-03-22 DIAGNOSIS — H40033 Anatomical narrow angle, bilateral: Secondary | ICD-10-CM | POA: Diagnosis not present

## 2023-03-22 DIAGNOSIS — E119 Type 2 diabetes mellitus without complications: Secondary | ICD-10-CM | POA: Diagnosis not present

## 2023-04-26 DIAGNOSIS — E890 Postprocedural hypothyroidism: Secondary | ICD-10-CM | POA: Diagnosis not present

## 2023-04-26 DIAGNOSIS — E1149 Type 2 diabetes mellitus with other diabetic neurological complication: Secondary | ICD-10-CM | POA: Diagnosis not present

## 2023-04-26 DIAGNOSIS — M810 Age-related osteoporosis without current pathological fracture: Secondary | ICD-10-CM | POA: Diagnosis not present

## 2023-04-26 DIAGNOSIS — E785 Hyperlipidemia, unspecified: Secondary | ICD-10-CM | POA: Diagnosis not present

## 2023-04-26 DIAGNOSIS — I10 Essential (primary) hypertension: Secondary | ICD-10-CM | POA: Diagnosis not present

## 2023-05-03 DIAGNOSIS — Z0000 Encounter for general adult medical examination without abnormal findings: Secondary | ICD-10-CM | POA: Diagnosis not present

## 2023-05-03 DIAGNOSIS — J439 Emphysema, unspecified: Secondary | ICD-10-CM | POA: Diagnosis not present

## 2023-05-03 DIAGNOSIS — Z1331 Encounter for screening for depression: Secondary | ICD-10-CM | POA: Diagnosis not present

## 2023-05-03 DIAGNOSIS — C50919 Malignant neoplasm of unspecified site of unspecified female breast: Secondary | ICD-10-CM | POA: Diagnosis not present

## 2023-05-03 DIAGNOSIS — I700 Atherosclerosis of aorta: Secondary | ICD-10-CM | POA: Diagnosis not present

## 2023-05-03 DIAGNOSIS — E669 Obesity, unspecified: Secondary | ICD-10-CM | POA: Diagnosis not present

## 2023-05-03 DIAGNOSIS — E785 Hyperlipidemia, unspecified: Secondary | ICD-10-CM | POA: Diagnosis not present

## 2023-05-03 DIAGNOSIS — I10 Essential (primary) hypertension: Secondary | ICD-10-CM | POA: Diagnosis not present

## 2023-05-03 DIAGNOSIS — E1149 Type 2 diabetes mellitus with other diabetic neurological complication: Secondary | ICD-10-CM | POA: Diagnosis not present

## 2023-05-03 DIAGNOSIS — Z23 Encounter for immunization: Secondary | ICD-10-CM | POA: Diagnosis not present

## 2023-05-03 DIAGNOSIS — N390 Urinary tract infection, site not specified: Secondary | ICD-10-CM | POA: Diagnosis not present

## 2023-05-03 DIAGNOSIS — Z1339 Encounter for screening examination for other mental health and behavioral disorders: Secondary | ICD-10-CM | POA: Diagnosis not present

## 2023-05-03 DIAGNOSIS — F17201 Nicotine dependence, unspecified, in remission: Secondary | ICD-10-CM | POA: Diagnosis not present

## 2023-09-20 ENCOUNTER — Ambulatory Visit (INDEPENDENT_AMBULATORY_CARE_PROVIDER_SITE_OTHER): Payer: Medicare PPO | Admitting: Otolaryngology

## 2023-09-20 ENCOUNTER — Encounter (INDEPENDENT_AMBULATORY_CARE_PROVIDER_SITE_OTHER): Payer: Self-pay | Admitting: Otolaryngology

## 2023-09-20 ENCOUNTER — Ambulatory Visit
Admission: RE | Admit: 2023-09-20 | Discharge: 2023-09-20 | Disposition: A | Discharge status: Home / Self Care | Source: Ambulatory Visit | Attending: Hematology

## 2023-09-20 VITALS — BP 127/83 | HR 97 | Ht 63.0 in | Wt 171.0 lb

## 2023-09-20 DIAGNOSIS — J310 Chronic rhinitis: Secondary | ICD-10-CM

## 2023-09-20 DIAGNOSIS — J342 Deviated nasal septum: Secondary | ICD-10-CM

## 2023-09-20 DIAGNOSIS — R0982 Postnasal drip: Secondary | ICD-10-CM | POA: Diagnosis not present

## 2023-09-20 DIAGNOSIS — J343 Hypertrophy of nasal turbinates: Secondary | ICD-10-CM

## 2023-09-20 DIAGNOSIS — Z1231 Encounter for screening mammogram for malignant neoplasm of breast: Secondary | ICD-10-CM

## 2023-09-20 DIAGNOSIS — R0981 Nasal congestion: Secondary | ICD-10-CM | POA: Diagnosis not present

## 2023-09-20 DIAGNOSIS — H6123 Impacted cerumen, bilateral: Secondary | ICD-10-CM | POA: Diagnosis not present

## 2023-09-20 DIAGNOSIS — H903 Sensorineural hearing loss, bilateral: Secondary | ICD-10-CM | POA: Diagnosis not present

## 2023-09-20 NOTE — Progress Notes (Unsigned)
 Patient ID: Taylor Walsh, female   DOB: 1955-11-11, 68 y.o.   MRN: [id number]  Follow-up: Chronic nasal congestion, nasal drainage, hearing loss  HPI: The patient is a 68 year old female who returns today for her follow-up evaluation.  She was last seen 1 year ago.  At that time, she was complaining of chronic nasal drainage and nasal congestion.  She was noted to have nasal mucosal congestion, nasal septal deviation, and bilateral inferior turbinate hypertrophy.  She was treated with Atrovent nasal spray and nasal saline irrigation.  She also has a history of bilateral mild to moderate sensorineural hearing loss, likely secondary to presbycusis.  The patient returns today reporting occasional nasal congestion and nasal drainage.  She denies any change in her hearing.  Currently she denies any facial pain, fever, or visual change.  Exam: General: Communicates without difficulty, well nourished, no acute distress. Head: Normocephalic, no evidence injury, no tenderness, facial buttresses intact without stepoff. Face/sinus: No tenderness to palpation and percussion. Facial movement is normal and symmetric. Eyes: PERRL, EOMI. No scleral icterus, conjunctivae clear. Neuro: CN II exam reveals vision grossly intact.  No nystagmus at any point of gaze. Ears: Auricles well formed without lesions.  Bilateral cerumen impaction.  Nose: External evaluation reveals normal support and skin without lesions.  Dorsum is intact.  Anterior rhinoscopy reveals congested mucosa over anterior aspect of inferior turbinates and deviated septum.  No purulence noted. Oral:  Oral cavity and oropharynx are intact, symmetric, without erythema or edema.  Mucosa is moist without lesions. Neck: Full range of motion without pain.  There is no significant lymphadenopathy.  No masses palpable.  Thyroid  bed within normal limits to palpation.  Parotid glands and submandibular glands equal bilaterally without mass.  Trachea is midline. Neuro:  CN  2-12 grossly intact.   Procedure: Bilateral cerumen disimpaction Anesthesia: None Description: Under the operating microscope, the cerumen is carefully removed with a combination of cerumen currette, alligator forceps, and suction catheters.  After the cerumen is removed, the TMs are noted to be normal.  No mass, erythema, or lesions. The patient tolerated the procedure well.    Assessment: 1.  Chronic rhinitis with nasal mucosal congestion, nasal septal deviation, and bilateral inferior turbinate hypertrophy. 2.  Chronic postnasal drainage.  The severity has decreased with the medical treatment. 3.  No suspicious mass or lesion is noted today. 4.  Subjectively stable bilateral sensorineural hearing loss. 5.  Incidental finding of bilateral cerumen impaction.  Plan: 1.  The physical exam findings are reviewed with the patient. 2.  Otomicroscopy with bilateral cerumen disimpaction. 3.  Continue with Atrovent nasal spray and nasal saline irrigation.  Atrovent refills are given to the patient. 4.  The patient will return for reevaluation in 1 year.

## 2023-09-21 DIAGNOSIS — J310 Chronic rhinitis: Secondary | ICD-10-CM | POA: Insufficient documentation

## 2023-09-21 DIAGNOSIS — J343 Hypertrophy of nasal turbinates: Secondary | ICD-10-CM | POA: Insufficient documentation

## 2023-09-21 DIAGNOSIS — J342 Deviated nasal septum: Secondary | ICD-10-CM | POA: Insufficient documentation

## 2023-09-21 DIAGNOSIS — H903 Sensorineural hearing loss, bilateral: Secondary | ICD-10-CM | POA: Insufficient documentation

## 2023-09-21 DIAGNOSIS — H6123 Impacted cerumen, bilateral: Secondary | ICD-10-CM | POA: Insufficient documentation

## 2023-10-14 DIAGNOSIS — E890 Postprocedural hypothyroidism: Secondary | ICD-10-CM | POA: Diagnosis not present

## 2023-10-14 DIAGNOSIS — C50919 Malignant neoplasm of unspecified site of unspecified female breast: Secondary | ICD-10-CM | POA: Diagnosis not present

## 2023-10-14 DIAGNOSIS — E1149 Type 2 diabetes mellitus with other diabetic neurological complication: Secondary | ICD-10-CM | POA: Diagnosis not present

## 2023-10-14 DIAGNOSIS — I10 Essential (primary) hypertension: Secondary | ICD-10-CM | POA: Diagnosis not present

## 2023-10-14 DIAGNOSIS — F17201 Nicotine dependence, unspecified, in remission: Secondary | ICD-10-CM | POA: Diagnosis not present

## 2023-10-14 DIAGNOSIS — I700 Atherosclerosis of aorta: Secondary | ICD-10-CM | POA: Diagnosis not present

## 2023-10-14 DIAGNOSIS — E785 Hyperlipidemia, unspecified: Secondary | ICD-10-CM | POA: Diagnosis not present

## 2023-10-14 DIAGNOSIS — J439 Emphysema, unspecified: Secondary | ICD-10-CM | POA: Diagnosis not present

## 2023-10-18 ENCOUNTER — Encounter: Payer: Self-pay | Admitting: Acute Care

## 2023-10-28 ENCOUNTER — Other Ambulatory Visit: Payer: Self-pay | Admitting: Acute Care

## 2023-10-28 DIAGNOSIS — Z122 Encounter for screening for malignant neoplasm of respiratory organs: Secondary | ICD-10-CM

## 2023-10-28 DIAGNOSIS — Z87891 Personal history of nicotine dependence: Secondary | ICD-10-CM

## 2023-11-01 ENCOUNTER — Inpatient Hospital Stay: Admission: RE | Admit: 2023-11-01 | Source: Ambulatory Visit

## 2023-11-14 ENCOUNTER — Other Ambulatory Visit: Payer: Self-pay | Admitting: Hematology

## 2023-11-14 DIAGNOSIS — C50212 Malignant neoplasm of upper-inner quadrant of left female breast: Secondary | ICD-10-CM

## 2023-11-17 ENCOUNTER — Other Ambulatory Visit: Payer: Self-pay | Admitting: Nurse Practitioner

## 2023-11-17 DIAGNOSIS — Z170 Estrogen receptor positive status [ER+]: Secondary | ICD-10-CM

## 2023-11-17 NOTE — Assessment & Plan Note (Signed)
invasive ductal carcinoma, pT1aN1aM0, stage IA, G2, ER+/PR+/HER2-, with DCIS, high grade, mammaprint high risk luminal type B -Diagnosed 06/2016, s/p left mastectomy and reconstruction, adjuvant chemo AC-T, and radiation -She began antiestrogen therapy with anastrozole 05/2017, due to poor tolerance switched to letrozole 06/2017 and has been tolerating well.  Plan to continue for total 7 years -S/p right breast reduction 08/31/2018 for symmetry -from a breast cancer standpoint, she is clinically doing well. Labs reviewed, overall no concern. Physical exam was unremarkable. There is no clinical concern for recurrence.

## 2023-11-17 NOTE — Progress Notes (Unsigned)
 Patient Care Team: Loreli Elsie JONETTA Mickey., MD as PCP - General (Internal Medicine) Kerrin Elspeth BROCKS, MD as Consulting Physician (Cardiothoracic Surgery) Gail Favorite, MD as Consulting Physician (General Surgery) Lanny Callander, MD as Consulting Physician (Hematology) Crawford, Morna Pickle, NP as Nurse Practitioner (Hematology and Oncology) Izell Domino, MD as Attending Physician (Radiation Oncology)  Clinic Day:  11/18/2023  Referring physician: Loreli Elsie JONETTA Mickey., MD  ASSESSMENT & PLAN:   Assessment & Plan: Breast cancer of upper-inner quadrant of left female breast [facility])  invasive ductal carcinoma, pT1aN1aM0, stage IA, G2, ER+/PR+/HER2-, with DCIS, high grade, 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  Plan to continue for total 7 years -S/p right breast reduction 08/31/2018 for symmetry - 09/20/2023 -3D screening mammogram was negative.  Breast density is category B. - Screening mammogram should be scheduled for September 20, 2024.  Order placed as part of today's visit. - Continue letrozole .  Patient would like to continue taking letrozole  for 10 years. -Labs and follow-up in 1 year, sooner if needed.   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  She does take gabapentin  3 times daily as needed.  She has previously been given permanent handicap tag as walking long distances causes severe pain in her feet.  Her current tag expires in March 2026, prior to her next annual visit.  A new application was completed for [place] [employer] for permanent handicap tag.  The tach should be good for 5 years starting in April 2026.  Plan Labs reviewed. -CBC and CMP are unremarkable today. Breast exam is benign. 3D screening mammogram will be  ordered for June 2026. Continue letrozole  daily.  Will fill as needed. Labs and follow-up in 1 year, sooner if needed. The patient understands the plans discussed today and is in agreement with them.  She knows to contact our office if she develops concerns prior to her next appointment.  I provided 25 minutes of face-to-face time during this encounter and > 50% was spent counseling as documented under my assessment and plan.    Powell FORBES Lessen, NP  [facility] - A DEPT OF [facility] [address] [place] [address] Dept: [phone] Dept Fax: [phone]   No orders of the defined types were placed in this encounter.     CHIEF COMPLAINT:  CC: left breast cancer, ER +  Current Treatment:  Letrozole  started 06/2017 (goal 7 years)  INTERVAL HISTORY:  Taylor Walsh is here today for repeat clinical assessment. She was last seen by Dr. Lanny 11/18/2022. She had 3D screening mammogram 09/20/2023 with negative results. She will be due for bone density test. Last one done 08/2020 at [facility].  She reports tolerating letrozole  well.  She would like to take it for longer than recommended goal of 7 years.  She denies changes in either breast.  She  denies chest pain, chest pressure, or shortness of breath. He denies headaches or visual disturbances. He denies abdominal pain, nausea, vomiting, or changes in bowel or bladder habits.  She denies fevers or chills. She denies pain. Her appetite is good. Her weight has increased 4 pounds over last 1 year.  I have reviewed the past medical history, past surgical history, social history  and family history with the patient and they are unchanged from previous note.  ALLERGIES:  is allergic to vicodin [hydrocodone -acetaminophen ].  MEDICATIONS:  Current Outpatient Medications  Medication Sig Dispense Refill   albuterol  (VENTOLIN  HFA) 108 (90 Base) MCG/ACT inhaler Inhale 1-2  puffs into the lungs every 6 (six) hours as needed for wheezing or shortness of breath.     aspirin  81 MG tablet Take 81 mg by mouth daily.     atorvastatin  (LIPITOR) 40 MG tablet Take 40 mg by mouth daily.     calcium -vitamin D 250-100 MG-UNIT tablet Take 2 tablets by mouth daily.      ergocalciferol (VITAMIN D2) 50000 UNITS capsule Take 50,000 Units by mouth every Friday.      fluticasone (FLONASE) 50 MCG/ACT nasal spray fluticasone propionate 50 mcg/actuation nasal spray,suspension  USE 1 SPRAY(S) IN EACH NOSTRIL TWICE DAILY     gabapentin  (NEURONTIN ) 300 MG capsule gabapentin  300 mg capsule  TAKE 1 CAPSULE BY MOUTH EVERY 8 HOURS     letrozole  (FEMARA ) 2.5 MG tablet Take 1 tablet by mouth once daily 90 tablet 0   levothyroxine  (SYNTHROID , LEVOTHROID) 75 MCG tablet   6   losartan (COZAAR) 50 MG tablet TAKE 1 TABLET BY MOUTH ONCE DAILY FOR 90 DAYS  1   metFORMIN  (GLUCOPHAGE ) 500 MG tablet TAKE 1 TABLET BY MOUTH TWICE DAILY WITH A MEAL (Patient taking differently: 1,000 mg 2 (two) times daily with a meal. TAKE 1 TABLET BY MOUTH TWICE DAILY WITH A MEAL) 60 tablet 2   valACYclovir  (VALTREX ) 1000 MG tablet Take 1,000 mg by mouth daily.     zoledronic  acid (RECLAST ) 5 MG/100ML SOLN injection Reclast  5 mg/100 mL intravenous piggyback  Inject by intravenous route.     No current facility-administered medications for this visit.    HISTORY OF PRESENT ILLNESS:   Oncology History Overview Note  Cancer Staging Breast cancer of upper-inner quadrant of left female breast [facility]) Staging form: Breast, AJCC 8th Edition - Clinical stage from 07/10/2016: Stage 0 (cTis (DCIS), cN0, cM0, G3, ER: Positive, PR: Positive, HER2: Not Assessed) - Signed by Lanny Callander, MD on 07/28/2016 - Pathologic stage from 10/26/2016: Stage IA (pT1a, pN1a, cM0, G2, ER: Positive, PR: Positive, HER2: Negative) - Signed by Lanny Callander, MD on 11/11/2016     Breast cancer of upper-inner quadrant of left female breast (HCC)  07/10/2016  Initial Biopsy   Diagnosis 1. 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 2. 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   07/10/2016 Receptors her2   ER 95%, PR 80-90%+,  both strong staining, HER2 - (1+), Ki67 80-90%   07/22/2016 Initial Diagnosis   Ductal carcinoma in situ (DCIS) of left breast   07/27/2016 Imaging   Breast MRI w wo contrast showed  1. Extensive mass and non mass enhancement along the lower inner aspect the left breast, lower outer quadrant, spanning 10.2 cm from anterior to posterior, including both biopsied lesions, as detailed above. This is all consistent with DCIS. 2. No other abnormal enhancement in the left breast. 3. No abnormal or enlarged axillary lymph nodes. 4. 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 The Hereditary Gene Panel offered by [facility] includes sequencing and/or deletion  duplication testing of the following 46 genes: APC, ATM, AXIN2, BARD1, BMPR1A, BRCA1, BRCA2, BRIP1, CDH1, CDKN2A (p14ARF), CDKN2A (p16INK4a), CHEK2, CTNNA1, DICER1, EPCAM (Deletion/duplication testing only), GREM1 (promoter region deletion/duplication testing only), KIT, MEN1, MLH1, MSH2, MSH3, MSH6, MUTYH, NBN, NF1, NHTL1, PALB2, PDGFRA, PMS2, POLD1, POLE, PTEN, RAD50, RAD51C, RAD51D, SDHB, SDHC, SDHD, SMAD4, SMARCA4. STK11, TP53, TSC1, TSC2, and VHL.  The following gene was evaluated for sequence changes only: SDHA and HOXB13 c.251G>A variant only.  The report date is Aug 09, 2016. 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  The change in variant classification was made as a result of re-review of the evidence in light of new variant interpretation  guidelines and /or new information. The updated report date is January 26, 2018.    10/13/2016 Surgery   LEFT TOTAL MASTECTOMY WITH LEFT AXILLARY SENTINEL LYMPH NODE BIOPSY By Dr. Vernetta   10/13/2016 Pathology Results   Surgery Diagnosis 10/13/16  1. Lymph node, sentinel, biopsy, Left Axillary - METASTATIC CARCINOMA IN ONE LYMPH NODE (1/1). 2. Lymph node, sentinel, biopsy, Left - METASTATIC CARCINOMA IN ONE LYMPH NODE (1/1). 3. Breast, radical mastectomy (including lymph nodes), Left - INVASIVE DUCTAL CARCINOMA, 0.3 CM. - LYMPHOVASCULAR INVOLVEMENT BY CARCINOMA. - HIGH GRADE DUCTAL CARCINOMA IN SITU WITH CALCIFICATIONS AND NECROSIS, 5.5 CM. - DCIS FOCALLY 0.1 CM FROM CAUTERIZED MEDIAL MARGIN. - SEE ONCOLOGY TABLE AND COMMENT. 4. Skin , Left additional mastectomy flap - BENIGN FIBROADIPOSE TISSUE. - NO EVIDENCE OF MALIGNANCY.   10/30/2016 Miscellaneous   MammaPrint 10/31/26 Result:  High Risk,Luminal type B MPI: -0.368 Average 10-year risk of recurrence untreated: 29%   11/18/2016 Imaging   CT CAP W Contrast 11/18/16 IMPRESSION: 1. Skin thickening and interstitial changes involving the left breast, likely related to radiation. Surgical changes are also noted with a tissue expander in place. Probable postop fluid collection in the upper outer quadrant breast. No enlarged supraclavicular or axillary lymph nodes. 2. No CT findings suggesting metastatic disease involving the chest, abdomen or pelvis or bony structures. 3. Surgical changes from a left upper lobe wedge resection. No findings for recurrent tumor.   11/23/2016 Imaging   Bone Scan Whole body 11/23/16 IMPRESSION: No definite scintigraphic evidence of osseous metastatic disease as above.   11/25/2016 - 04/07/2017 Chemotherapy   Adjuvant chemotherapy AC every 2 weeks for 4 cycles, 11/25/16-01/05/17, followed by Taxol  weekly for 12 cycles starting 01/20/17    04/28/2017 - 06/11/2017 Radiation Therapy    1. Left Chest Wall /  50.4 Gy in 28 fractions 2. Left SCV and PAB / 50.4 Gy in 28 fractions  3. Left Chest Wall Boost / 10 Gy in 5 fractions    05/2017 -  Anti-estrogen oral therapy   Anastrozole  initially, then changed to Letrozole  in April 2019       REVIEW OF SYSTEMS:   Constitutional: Denies fevers, chills or abnormal weight loss Eyes: Denies blurriness of vision Ears, nose, mouth, throat, and face: Denies mucositis or sore throat Respiratory: Denies cough, dyspnea or wheezes Cardiovascular: Denies palpitation, chest discomfort or lower extremity swelling Gastrointestinal:  Denies nausea, heartburn or change in bowel habits Skin: Denies abnormal skin rashes Lymphatics: Denies new lymphadenopathy or easy bruising Neurological:Denies numbness, tingling or new weaknesses Behavioral/Psych: Mood is stable, no new changes  All other systems were reviewed with the patient and are negative.   VITALS:   Today's Vitals   11/18/23 1029  BP: 122/74  Pulse: 82  Resp: 17  Temp: (!) 97.2 F (  36.2 C)  SpO2: 99%  Weight: 175 lb 4.8 oz (79.5 kg)  Height: 5' 2 (1.575 m)  PainSc: 5    Body mass index is 32.06 kg/m.   Wt Readings from Last 3 Encounters:  11/18/23 175 lb 4.8 oz (79.5 kg)  09/20/23 171 lb (77.6 kg)  11/18/22 187 lb 3.2 oz (84.9 kg)    Body mass index is 32.06 kg/m.  Performance status (ECOG): 1 - Symptomatic but completely ambulatory  PHYSICAL EXAM:   GENERAL:alert, no distress and comfortable SKIN: skin color, texture, turgor are normal, no rashes or significant lesions EYES: normal, Conjunctiva are pink and non-injected, sclera clear OROPHARYNX:no exudate, no erythema and lips, buccal mucosa, and tongue normal  NECK: supple, thyroid  normal size, non-tender, without nodularity LYMPH:  no palpable lymphadenopathy in the cervical, axillary or inguinal LUNGS: clear to auscultation and percussion with normal breathing effort HEART: regular rate & rhythm and no murmurs and no lower  extremity edema ABDOMEN:abdomen soft, non-tender and normal bowel sounds Musculoskeletal:no cyanosis of digits and no clubbing  NEURO: alert & oriented x 3 with fluent speech, no focal motor/sensory deficits BREAST: There are no palpable lumps or masses in the right breast.  There is no nipple inversion or nipple discharge.  There is no axillary lymphadenopathy on the right.  The left breast has been reconstructed.  There is horizontal scar across the upper left chest which is well-healed.  The nipple is surgically absent.  There is no tenderness, masses, or lumps in the left breast.  There is no axillary lymphadenopathy on the left.  LABORATORY DATA:  I have reviewed the data as listed    Component Value Date/Time   NA 138 11/18/2023 1009   NA 139 03/31/2017 0947   K 4.1 11/18/2023 1009   K 3.8 03/31/2017 0947   CL 104 11/18/2023 1009   CO2 27 11/18/2023 1009   CO2 25 03/31/2017 0947   GLUCOSE 139 (H) 11/18/2023 1009   GLUCOSE 127 03/31/2017 0947   BUN 13 11/18/2023 1009   BUN 11.1 03/31/2017 0947   CREATININE 0.83 11/18/2023 1009   CREATININE 0.7 03/31/2017 0947   CALCIUM  9.3 11/18/2023 1009   CALCIUM  9.4 03/31/2017 0947   PROT 7.0 11/18/2023 1009   PROT 6.9 03/31/2017 0947   ALBUMIN 4.3 11/18/2023 1009   ALBUMIN 3.9 03/31/2017 0947   AST 17 11/18/2023 1009   AST 20 03/31/2017 0947   ALT 16 11/18/2023 1009   ALT 20 03/31/2017 0947   ALKPHOS 50 11/18/2023 1009   ALKPHOS 68 03/31/2017 0947   BILITOT 0.7 11/18/2023 1009   BILITOT 1.04 03/31/2017 0947   GFRNONAA >60 11/18/2023 1009   GFRAA >60 12/06/2019 1256    Lab Results  Component Value Date   WBC 4.3 11/18/2023   NEUTROABS 2.1 11/18/2023   HGB 13.0 11/18/2023   HCT 40.5 11/18/2023   MCV 81.5 11/18/2023   PLT 275 11/18/2023

## 2023-11-18 ENCOUNTER — Encounter: Payer: Self-pay | Admitting: Nurse Practitioner

## 2023-11-18 ENCOUNTER — Inpatient Hospital Stay: Payer: Medicare PPO | Admitting: Nurse Practitioner

## 2023-11-18 ENCOUNTER — Inpatient Hospital Stay: Payer: Medicare PPO | Attending: Nurse Practitioner

## 2023-11-18 VITALS — BP 122/74 | HR 82 | Temp 97.2°F | Resp 17 | Ht 62.0 in | Wt 175.3 lb

## 2023-11-18 DIAGNOSIS — C50212 Malignant neoplasm of upper-inner quadrant of left female breast: Secondary | ICD-10-CM | POA: Insufficient documentation

## 2023-11-18 DIAGNOSIS — Z79811 Long term (current) use of aromatase inhibitors: Secondary | ICD-10-CM | POA: Diagnosis not present

## 2023-11-18 DIAGNOSIS — Z170 Estrogen receptor positive status [ER+]: Secondary | ICD-10-CM | POA: Diagnosis not present

## 2023-11-18 DIAGNOSIS — Z17411 Hormone receptor positive with human epidermal growth factor receptor 2 negative status: Secondary | ICD-10-CM | POA: Insufficient documentation

## 2023-11-18 LAB — CBC WITH DIFFERENTIAL (CANCER CENTER ONLY)
Abs Immature Granulocytes: 0.01 K/uL (ref 0.00–0.07)
Basophils Absolute: 0 K/uL (ref 0.0–0.1)
Basophils Relative: 1 %
Eosinophils Absolute: 0.1 K/uL (ref 0.0–0.5)
Eosinophils Relative: 2 %
HCT: 40.5 % (ref 36.0–46.0)
Hemoglobin: 13 g/dL (ref 12.0–15.0)
Immature Granulocytes: 0 %
Lymphocytes Relative: 35 %
Lymphs Abs: 1.5 K/uL (ref 0.7–4.0)
MCH: 26.2 pg (ref 26.0–34.0)
MCHC: 32.1 g/dL (ref 30.0–36.0)
MCV: 81.5 fL (ref 80.0–100.0)
Monocytes Absolute: 0.5 K/uL (ref 0.1–1.0)
Monocytes Relative: 13 %
Neutro Abs: 2.1 K/uL (ref 1.7–7.7)
Neutrophils Relative %: 49 %
Platelet Count: 275 K/uL (ref 150–400)
RBC: 4.97 MIL/uL (ref 3.87–5.11)
RDW: 15.8 % — ABNORMAL HIGH (ref 11.5–15.5)
WBC Count: 4.3 K/uL (ref 4.0–10.5)
nRBC: 0 % (ref 0.0–0.2)

## 2023-11-18 LAB — CMP (CANCER CENTER ONLY)
ALT: 16 U/L (ref 0–44)
AST: 17 U/L (ref 15–41)
Albumin: 4.3 g/dL (ref 3.5–5.0)
Alkaline Phosphatase: 50 U/L (ref 38–126)
Anion gap: 7 (ref 5–15)
BUN: 13 mg/dL (ref 8–23)
CO2: 27 mmol/L (ref 22–32)
Calcium: 9.3 mg/dL (ref 8.9–10.3)
Chloride: 104 mmol/L (ref 98–111)
Creatinine: 0.83 mg/dL (ref 0.44–1.00)
GFR, Estimated: >60 mL/min (ref >60)
Glucose, Bld: 139 mg/dL — ABNORMAL HIGH (ref 70–99)
Potassium: 4.1 mmol/L (ref 3.5–5.1)
Sodium: 138 mmol/L (ref 135–145)
Total Bilirubin: 0.7 mg/dL (ref 0.0–1.2)
Total Protein: 7 g/dL (ref 6.5–8.1)

## 2023-11-18 NOTE — Addendum Note (Signed)
 Addended by: HANFORD MOATS on: 11/18/2023 01:51 PM   Modules accepted: Orders

## 2023-11-19 ENCOUNTER — Telehealth: Payer: Self-pay | Admitting: Nurse Practitioner

## 2023-11-19 ENCOUNTER — Ambulatory Visit

## 2023-11-19 ENCOUNTER — Ambulatory Visit
Admission: RE | Admit: 2023-11-19 | Discharge: 2023-11-19 | Disposition: A | Discharge status: Home / Self Care | Source: Ambulatory Visit | Attending: Acute Care | Admitting: Acute Care

## 2023-11-19 DIAGNOSIS — Z122 Encounter for screening for malignant neoplasm of respiratory organs: Secondary | ICD-10-CM | POA: Diagnosis not present

## 2023-11-19 DIAGNOSIS — Z87891 Personal history of nicotine dependence: Secondary | ICD-10-CM

## 2023-11-19 NOTE — Telephone Encounter (Signed)
 Scheduled appointments with the patient for next year. She is aware and confirmed.

## 2023-12-06 ENCOUNTER — Other Ambulatory Visit: Payer: Self-pay | Admitting: Acute Care

## 2023-12-06 DIAGNOSIS — Z87891 Personal history of nicotine dependence: Secondary | ICD-10-CM

## 2023-12-06 DIAGNOSIS — Z122 Encounter for screening for malignant neoplasm of respiratory organs: Secondary | ICD-10-CM

## 2024-01-06 DIAGNOSIS — K219 Gastro-esophageal reflux disease without esophagitis: Secondary | ICD-10-CM | POA: Diagnosis not present

## 2024-01-06 DIAGNOSIS — I2510 Atherosclerotic heart disease of native coronary artery without angina pectoris: Secondary | ICD-10-CM | POA: Diagnosis not present

## 2024-01-06 DIAGNOSIS — I10 Essential (primary) hypertension: Secondary | ICD-10-CM | POA: Diagnosis not present

## 2024-01-06 DIAGNOSIS — I700 Atherosclerosis of aorta: Secondary | ICD-10-CM | POA: Diagnosis not present

## 2024-01-06 DIAGNOSIS — G620 Drug-induced polyneuropathy: Secondary | ICD-10-CM | POA: Diagnosis not present

## 2024-01-06 DIAGNOSIS — E1136 Type 2 diabetes mellitus with diabetic cataract: Secondary | ICD-10-CM | POA: Diagnosis not present

## 2024-01-06 DIAGNOSIS — E785 Hyperlipidemia, unspecified: Secondary | ICD-10-CM | POA: Diagnosis not present

## 2024-01-06 DIAGNOSIS — C50919 Malignant neoplasm of unspecified site of unspecified female breast: Secondary | ICD-10-CM | POA: Diagnosis not present

## 2024-01-06 DIAGNOSIS — E669 Obesity, unspecified: Secondary | ICD-10-CM | POA: Diagnosis not present

## 2024-02-14 ENCOUNTER — Other Ambulatory Visit: Payer: Self-pay | Admitting: Hematology

## 2024-02-14 DIAGNOSIS — C50212 Malignant neoplasm of upper-inner quadrant of left female breast: Secondary | ICD-10-CM

## 2024-02-21 DIAGNOSIS — Z1272 Encounter for screening for malignant neoplasm of vagina: Secondary | ICD-10-CM | POA: Diagnosis not present

## 2024-02-21 DIAGNOSIS — Z6831 Body mass index (BMI) 31.0-31.9, adult: Secondary | ICD-10-CM | POA: Diagnosis not present

## 2024-02-21 DIAGNOSIS — Z124 Encounter for screening for malignant neoplasm of cervix: Secondary | ICD-10-CM | POA: Diagnosis not present

## 2024-02-21 DIAGNOSIS — M810 Age-related osteoporosis without current pathological fracture: Secondary | ICD-10-CM | POA: Diagnosis not present

## 2024-03-08 ENCOUNTER — Other Ambulatory Visit (INDEPENDENT_AMBULATORY_CARE_PROVIDER_SITE_OTHER): Payer: Self-pay | Admitting: Otolaryngology

## 2024-03-08 MED ORDER — IPRATROPIUM BROMIDE 0.06 % NA SOLN: 2.0000 | Freq: Two times a day (BID) | NASAL | 12 refills | Status: AC | PRN

## 2024-11-24 ENCOUNTER — Other Ambulatory Visit

## 2024-11-24 ENCOUNTER — Ambulatory Visit: Admitting: Nurse Practitioner
